# Patient Record
Sex: Male | Born: 1955 | Race: White | Hispanic: No
Health system: Southern US, Community
[De-identification: ages and names within clinical notes are randomized; demographics above are authoritative.]

---

## 2013-07-18 DIAGNOSIS — I619 Nontraumatic intracerebral hemorrhage, unspecified: Secondary | ICD-10-CM | POA: Insufficient documentation

## 2013-11-03 DIAGNOSIS — E119 Type 2 diabetes mellitus without complications: Secondary | ICD-10-CM | POA: Insufficient documentation

## 2013-11-03 DIAGNOSIS — I10 Essential (primary) hypertension: Secondary | ICD-10-CM | POA: Insufficient documentation

## 2013-11-03 DIAGNOSIS — E785 Hyperlipidemia, unspecified: Secondary | ICD-10-CM | POA: Insufficient documentation

## 2014-04-28 DIAGNOSIS — I639 Cerebral infarction, unspecified: Secondary | ICD-10-CM | POA: Insufficient documentation

## 2014-04-28 DIAGNOSIS — I6522 Occlusion and stenosis of left carotid artery: Secondary | ICD-10-CM | POA: Insufficient documentation

## 2016-06-28 ENCOUNTER — Ambulatory Visit (INDEPENDENT_AMBULATORY_CARE_PROVIDER_SITE_OTHER): Payer: Medicare Other | Admitting: Sports Medicine

## 2016-06-28 ENCOUNTER — Encounter: Payer: Self-pay | Admitting: Sports Medicine

## 2016-06-28 DIAGNOSIS — IMO0002 Reserved for concepts with insufficient information to code with codable children: Secondary | ICD-10-CM

## 2016-06-28 DIAGNOSIS — E1142 Type 2 diabetes mellitus with diabetic polyneuropathy: Secondary | ICD-10-CM

## 2016-06-28 DIAGNOSIS — M79675 Pain in left toe(s): Secondary | ICD-10-CM

## 2016-06-28 DIAGNOSIS — Z89511 Acquired absence of right leg below knee: Secondary | ICD-10-CM

## 2016-06-28 DIAGNOSIS — B351 Tinea unguium: Secondary | ICD-10-CM | POA: Diagnosis not present

## 2016-06-28 NOTE — Progress Notes (Signed)
Subjective: Burle Kwan is a 60 y.o. male patient with history of diabetes who presents to office today complaining of long, painful nails  while ambulating in shoes; unable to trim however states that his mom trimmed them last week. Patient states that the glucose reading this morning was 95 mg/dl. Patient denies any new changes in medication or new problems. Patient denies any new cramping, numbness, burning or tingling in the legs. Diagnosed with Diabetes 5 years ago and lost right leg 1 year ago secondary to "pain".   Patient Active Problem List   Diagnosis Date Noted  . Carotid stenosis, left 04/28/2014  . Stroke ([place]) 04/28/2014  . Diabetes ([place]) 11/03/2013  . HTN (hypertension) 11/03/2013  . Hyperlipidemia with target low density lipoprotein (LDL) cholesterol less than 70 mg/dL 11/03/2013  . ICH (intracerebral hemorrhage) ([place]) 07/18/2013   No current outpatient prescriptions on file prior to visit.   No current facility-administered medications on file prior to visit.    No Known Allergies  No results found for this or any previous visit (from the past 2160 hour(s)).  Objective: General: Patient is awake, alert, and oriented x 3 and in no acute distress, Cane assisted gait.   Integument: Skin is warm, dry and supple on left. Nails are tender, mildly elongated, thickened and dystrophic with subungual debris, consistent with onychomycosis, 1-5 on left, No signs of infection. No open lesions or preulcerative lesions present on left. Remaining integument unremarkable.  Vasculature:  Dorsalis Pedis pulse 1/4 left. Posterior Tibial pulse  1/4 left. Capillary fill time <3 sec 1-5 left. Scant hair growth to the level of the digits.Temperature gradient within normal limits. No varicosities present on left. No edema present on left.  Neurology: The patient has diminished sensation measured with a 5.07/10g Semmes Weinstein Monofilament at all pedal sites on left. Vibratory sensation  diminished on left with tuning fork. No Babinski sign present on left.  Musculoskeletal:  Asymptomatic hammertoe pedal deformities and restless leg noted on left. Muscular strength 5/5 in all lower extremity muscular groups on left without pain on range of motion . No tenderness with calf compression on left. Right below knee amputation status.   Assessment and Plan: Problem List Items Addressed This Visit    None    Visit Diagnoses    Dermatophytosis of nail    -  Primary   Toe pain, left       Diabetic polyneuropathy associated with type 2 diabetes mellitus (HCC)       Relevant Medications   atorvastatin (LIPITOR) 80 MG tablet   insulin lispro (HUMALOG KWIKPEN) 100 UNIT/ML KiwkPen   insulin glargine (LANTUS) 100 unit/mL SOPN   insulin aspart (NOVOLOG FLEXPEN) 100 UNIT/ML FlexPen   Below knee amputation status, right (HCC)         -Examined patient. -Discussed and educated patient on diabetic foot care, especially with regards to the vascular, neurological and musculoskeletal systems.  -Stressed the importance of good glycemic control and the detriment of not controlling glucose levels in relation to the foot. -Mechanically debrided all nails 1-5 on left using sterile nail nipper and filed with dremel without incident  -Answered all patient questions -Patient to return in 3 months for at risk foot care -Patient advised to call the office if any problems or questions arise in the meantime.  Landis Martins, DPM

## 2016-06-28 NOTE — Patient Instructions (Signed)
Diabetes and Foot Care Diabetes may cause you to have problems because of poor blood supply (circulation) to your feet and legs. This may cause the skin on your feet to become thinner, break easier, and heal more slowly. Your skin may become dry, and the skin may peel and crack. You may also have nerve damage in your legs and feet causing decreased feeling in them. You may not notice minor injuries to your feet that could lead to infections or more serious problems. Taking care of your feet is one of the most important things you can do for yourself.  HOME CARE INSTRUCTIONS  Wear shoes at all times, even in the house. Do not go barefoot. Bare feet are easily injured.  Check your feet daily for blisters, cuts, and redness. If you cannot see the bottom of your feet, use a mirror or ask someone for help.  Wash your feet with warm water (do not use hot water) and mild soap. Then pat your feet and the areas between your toes until they are completely dry. Do not soak your feet as this can dry your skin.  Apply a moisturizing lotion or petroleum jelly (that does not contain alcohol and is unscented) to the skin on your feet and to dry, brittle toenails. Do not apply lotion between your toes.  Trim your toenails straight across. Do not dig under them or around the cuticle. File the edges of your nails with an emery board or nail file.  Do not cut corns or calluses or try to remove them with medicine.  Wear clean socks or stockings every day. Make sure they are not too tight. Do not wear knee-high stockings since they may decrease blood flow to your legs.  Wear shoes that fit properly and have enough cushioning. To break in new shoes, wear them for just a few hours a day. This prevents you from injuring your feet. Always look in your shoes before you put them on to be sure there are no objects inside.  Do not cross your legs. This may decrease the blood flow to your feet.  If you find a minor scrape,  cut, or break in the skin on your feet, keep it and the skin around it clean and dry. These areas may be cleansed with mild soap and water. Do not cleanse the area with peroxide, alcohol, or iodine.  When you remove an adhesive bandage, be sure not to damage the skin around it.  If you have a wound, look at it several times a day to make sure it is healing.  Do not use heating pads or hot water bottles. They may burn your skin. If you have lost feeling in your feet or legs, you may not know it is happening until it is too late.  Make sure your health care provider performs a complete foot exam at least annually or more often if you have foot problems. Report any cuts, sores, or bruises to your health care provider immediately. SEEK MEDICAL CARE IF:   You have an injury that is not healing.  You have cuts or breaks in the skin.  You have an ingrown nail.  You notice redness on your legs or feet.  You feel burning or tingling in your legs or feet.  You have pain or cramps in your legs and feet.  Your legs or feet are numb.  Your feet always feel cold. SEEK IMMEDIATE MEDICAL CARE IF:   There is increasing redness,   swelling, or pain in or around a wound.  There is a red line that goes up your leg.  Pus is coming from a wound.  You develop a fever or as directed by your health care provider.  You notice a bad smell coming from an ulcer or wound.   This information is not intended to replace advice given to you by your health care provider. Make sure you discuss any questions you have with your health care provider.   Document Released: 09/06/2000 Document Revised: 05/12/2013 Document Reviewed: 02/16/2013 Elsevier Interactive Patient Education 2016 [employer]  

## 2016-08-08 DIAGNOSIS — E119 Type 2 diabetes mellitus without complications: Secondary | ICD-10-CM

## 2016-08-08 DIAGNOSIS — N390 Urinary tract infection, site not specified: Secondary | ICD-10-CM | POA: Diagnosis not present

## 2016-08-08 DIAGNOSIS — E872 Acidosis: Secondary | ICD-10-CM | POA: Diagnosis not present

## 2016-08-08 DIAGNOSIS — I10 Essential (primary) hypertension: Secondary | ICD-10-CM

## 2016-08-08 DIAGNOSIS — S37009A Unspecified injury of unspecified kidney, initial encounter: Secondary | ICD-10-CM

## 2016-08-08 DIAGNOSIS — R410 Disorientation, unspecified: Secondary | ICD-10-CM

## 2016-08-09 DIAGNOSIS — N390 Urinary tract infection, site not specified: Secondary | ICD-10-CM | POA: Diagnosis not present

## 2016-08-09 DIAGNOSIS — S37009A Unspecified injury of unspecified kidney, initial encounter: Secondary | ICD-10-CM | POA: Diagnosis not present

## 2016-08-09 DIAGNOSIS — E872 Acidosis: Secondary | ICD-10-CM | POA: Diagnosis not present

## 2016-08-09 DIAGNOSIS — R410 Disorientation, unspecified: Secondary | ICD-10-CM | POA: Diagnosis not present

## 2016-08-10 DIAGNOSIS — E872 Acidosis: Secondary | ICD-10-CM | POA: Diagnosis not present

## 2016-08-10 DIAGNOSIS — A419 Sepsis, unspecified organism: Secondary | ICD-10-CM

## 2016-08-10 DIAGNOSIS — N390 Urinary tract infection, site not specified: Secondary | ICD-10-CM | POA: Diagnosis not present

## 2016-08-10 DIAGNOSIS — R410 Disorientation, unspecified: Secondary | ICD-10-CM | POA: Diagnosis not present

## 2016-08-10 DIAGNOSIS — S37009A Unspecified injury of unspecified kidney, initial encounter: Secondary | ICD-10-CM | POA: Diagnosis not present

## 2016-08-11 DIAGNOSIS — R410 Disorientation, unspecified: Secondary | ICD-10-CM | POA: Diagnosis not present

## 2016-08-11 DIAGNOSIS — N390 Urinary tract infection, site not specified: Secondary | ICD-10-CM | POA: Diagnosis not present

## 2016-08-11 DIAGNOSIS — E872 Acidosis: Secondary | ICD-10-CM | POA: Diagnosis not present

## 2016-08-11 DIAGNOSIS — S37009A Unspecified injury of unspecified kidney, initial encounter: Secondary | ICD-10-CM | POA: Diagnosis not present

## 2016-08-12 DIAGNOSIS — N390 Urinary tract infection, site not specified: Secondary | ICD-10-CM | POA: Diagnosis not present

## 2016-08-12 DIAGNOSIS — R410 Disorientation, unspecified: Secondary | ICD-10-CM | POA: Diagnosis not present

## 2016-08-12 DIAGNOSIS — E872 Acidosis: Secondary | ICD-10-CM | POA: Diagnosis not present

## 2016-08-12 DIAGNOSIS — S37009A Unspecified injury of unspecified kidney, initial encounter: Secondary | ICD-10-CM | POA: Diagnosis not present

## 2016-09-24 DIAGNOSIS — N3011 Interstitial cystitis (chronic) with hematuria: Secondary | ICD-10-CM | POA: Diagnosis not present

## 2016-09-24 DIAGNOSIS — Z89511 Acquired absence of right leg below knee: Secondary | ICD-10-CM | POA: Diagnosis not present

## 2016-09-24 DIAGNOSIS — N184 Chronic kidney disease, stage 4 (severe): Secondary | ICD-10-CM | POA: Diagnosis not present

## 2016-09-24 DIAGNOSIS — Z9181 History of falling: Secondary | ICD-10-CM | POA: Diagnosis not present

## 2016-09-24 DIAGNOSIS — A419 Sepsis, unspecified organism: Secondary | ICD-10-CM | POA: Diagnosis not present

## 2016-09-24 DIAGNOSIS — N3001 Acute cystitis with hematuria: Secondary | ICD-10-CM | POA: Diagnosis not present

## 2016-09-24 DIAGNOSIS — I129 Hypertensive chronic kidney disease with stage 1 through stage 4 chronic kidney disease, or unspecified chronic kidney disease: Secondary | ICD-10-CM | POA: Diagnosis not present

## 2016-09-24 DIAGNOSIS — Z79891 Long term (current) use of opiate analgesic: Secondary | ICD-10-CM | POA: Diagnosis not present

## 2016-09-24 DIAGNOSIS — E1122 Type 2 diabetes mellitus with diabetic chronic kidney disease: Secondary | ICD-10-CM | POA: Diagnosis not present

## 2016-09-24 DIAGNOSIS — Z7982 Long term (current) use of aspirin: Secondary | ICD-10-CM | POA: Diagnosis not present

## 2016-09-24 DIAGNOSIS — Z8673 Personal history of transient ischemic attack (TIA), and cerebral infarction without residual deficits: Secondary | ICD-10-CM | POA: Diagnosis not present

## 2016-09-25 DIAGNOSIS — Z9181 History of falling: Secondary | ICD-10-CM | POA: Diagnosis not present

## 2016-09-25 DIAGNOSIS — N3011 Interstitial cystitis (chronic) with hematuria: Secondary | ICD-10-CM | POA: Diagnosis not present

## 2016-09-25 DIAGNOSIS — N3001 Acute cystitis with hematuria: Secondary | ICD-10-CM | POA: Diagnosis not present

## 2016-09-25 DIAGNOSIS — Z8673 Personal history of transient ischemic attack (TIA), and cerebral infarction without residual deficits: Secondary | ICD-10-CM | POA: Diagnosis not present

## 2016-09-25 DIAGNOSIS — Z7982 Long term (current) use of aspirin: Secondary | ICD-10-CM | POA: Diagnosis not present

## 2016-09-25 DIAGNOSIS — Z79891 Long term (current) use of opiate analgesic: Secondary | ICD-10-CM | POA: Diagnosis not present

## 2016-09-25 DIAGNOSIS — E1122 Type 2 diabetes mellitus with diabetic chronic kidney disease: Secondary | ICD-10-CM | POA: Diagnosis not present

## 2016-09-25 DIAGNOSIS — I129 Hypertensive chronic kidney disease with stage 1 through stage 4 chronic kidney disease, or unspecified chronic kidney disease: Secondary | ICD-10-CM | POA: Diagnosis not present

## 2016-09-25 DIAGNOSIS — A419 Sepsis, unspecified organism: Secondary | ICD-10-CM | POA: Diagnosis not present

## 2016-09-25 DIAGNOSIS — N184 Chronic kidney disease, stage 4 (severe): Secondary | ICD-10-CM | POA: Diagnosis not present

## 2016-09-25 DIAGNOSIS — Z89511 Acquired absence of right leg below knee: Secondary | ICD-10-CM | POA: Diagnosis not present

## 2016-09-27 DIAGNOSIS — N189 Chronic kidney disease, unspecified: Secondary | ICD-10-CM | POA: Diagnosis not present

## 2016-10-01 DIAGNOSIS — N3001 Acute cystitis with hematuria: Secondary | ICD-10-CM | POA: Diagnosis not present

## 2016-10-01 DIAGNOSIS — Z79891 Long term (current) use of opiate analgesic: Secondary | ICD-10-CM | POA: Diagnosis not present

## 2016-10-01 DIAGNOSIS — E1122 Type 2 diabetes mellitus with diabetic chronic kidney disease: Secondary | ICD-10-CM | POA: Diagnosis not present

## 2016-10-01 DIAGNOSIS — Z89511 Acquired absence of right leg below knee: Secondary | ICD-10-CM | POA: Diagnosis not present

## 2016-10-01 DIAGNOSIS — N3011 Interstitial cystitis (chronic) with hematuria: Secondary | ICD-10-CM | POA: Diagnosis not present

## 2016-10-01 DIAGNOSIS — Z8673 Personal history of transient ischemic attack (TIA), and cerebral infarction without residual deficits: Secondary | ICD-10-CM | POA: Diagnosis not present

## 2016-10-01 DIAGNOSIS — Z7982 Long term (current) use of aspirin: Secondary | ICD-10-CM | POA: Diagnosis not present

## 2016-10-01 DIAGNOSIS — A419 Sepsis, unspecified organism: Secondary | ICD-10-CM | POA: Diagnosis not present

## 2016-10-01 DIAGNOSIS — Z9181 History of falling: Secondary | ICD-10-CM | POA: Diagnosis not present

## 2016-10-01 DIAGNOSIS — I129 Hypertensive chronic kidney disease with stage 1 through stage 4 chronic kidney disease, or unspecified chronic kidney disease: Secondary | ICD-10-CM | POA: Diagnosis not present

## 2016-10-01 DIAGNOSIS — N184 Chronic kidney disease, stage 4 (severe): Secondary | ICD-10-CM | POA: Diagnosis not present

## 2016-10-04 ENCOUNTER — Encounter: Payer: Self-pay | Admitting: Sports Medicine

## 2016-10-04 ENCOUNTER — Ambulatory Visit (INDEPENDENT_AMBULATORY_CARE_PROVIDER_SITE_OTHER): Payer: Medicare Other | Admitting: Sports Medicine

## 2016-10-04 DIAGNOSIS — E1142 Type 2 diabetes mellitus with diabetic polyneuropathy: Secondary | ICD-10-CM

## 2016-10-04 DIAGNOSIS — B351 Tinea unguium: Secondary | ICD-10-CM | POA: Diagnosis not present

## 2016-10-04 DIAGNOSIS — M79675 Pain in left toe(s): Secondary | ICD-10-CM | POA: Diagnosis not present

## 2016-10-04 DIAGNOSIS — Z89511 Acquired absence of right leg below knee: Secondary | ICD-10-CM

## 2016-10-04 DIAGNOSIS — IMO0002 Reserved for concepts with insufficient information to code with codable children: Secondary | ICD-10-CM

## 2016-10-04 NOTE — Progress Notes (Signed)
Subjective: Ryan Villa is a 61 y.o. male patient with history of diabetes who presents to office today complaining of long, painful nails  while ambulating in shoe; unable to trim. Patient states that the glucose reading this morning was "ok". Patient denies any new changes in medication or new problems. Patient denies any new cramping, numbness, burning or tingling in the legs.  Patient Active Problem List   Diagnosis Date Noted  . Carotid stenosis, left 04/28/2014  . Stroke ([place]) 04/28/2014  . Diabetes ([place]) 11/03/2013  . HTN (hypertension) 11/03/2013  . Hyperlipidemia with target low density lipoprotein (LDL) cholesterol less than 70 mg/dL 11/03/2013  . ICH (intracerebral hemorrhage) ([place]) 07/18/2013   Current Outpatient Prescriptions on File Prior to Visit  Medication Sig Dispense Refill  . amLODipine (NORVASC) 10 MG tablet Take 10 mg by mouth.    [name] atorvastatin (LIPITOR) 80 MG tablet Take 80 mg by mouth.    . carvedilol (COREG) 6.25 MG tablet Take 6.25 mg by mouth.    . fluticasone (FLONASE) 50 MCG/ACT nasal spray Place into the nose.    [name] glucose blood (ONE TOUCH ULTRA TEST) test strip     . insulin aspart (NOVOLOG FLEXPEN) 100 UNIT/ML FlexPen Sliding scale    . insulin glargine (LANTUS) 100 unit/mL SOPN Inject into the skin.    [name] insulin lispro (HUMALOG KWIKPEN) 100 UNIT/ML KiwkPen     . ONETOUCH DELICA LANCETS 54U MISC     . Probiotic Product (FLORA-Q PO) Take by mouth.     No current facility-administered medications on file prior to visit.    No Known Allergies  No results found for this or any previous visit (from the past 2160 hour(s)).  Objective: General: Patient is awake, alert, and oriented x 3 and in no acute distress, Cane assisted gait.   Integument: Skin is warm, dry and supple on left. Nails are tender, mildly elongated, thickened and dystrophic with subungual debris, consistent with onychomycosis, 1-5 on left, + interdigital maceration, No signs of infection.  No open lesions or preulcerative lesions present on left. Remaining integument unremarkable.  Vasculature:  Dorsalis Pedis pulse 1/4 left. Posterior Tibial pulse  1/4 left. Capillary fill time <3 sec 1-5 left. Scant hair growth to the level of the digits.Temperature gradient within normal limits. No varicosities present on left. No edema present on left.  Neurology: The patient has diminished sensation measured with a 5.07/10g Semmes Weinstein Monofilament at all pedal sites on left. Vibratory sensation diminished on left with tuning fork. No Babinski sign present on left.  Musculoskeletal:  Asymptomatic hammertoe pedal deformities and restless leg noted on left. Muscular strength 5/5 in all lower extremity muscular groups on left without pain on range of motion . No tenderness with calf compression on left. Right below knee amputation status.   Assessment and Plan: Problem List Items Addressed This Visit    None    Visit Diagnoses    Dermatophytosis of nail    -  Primary   Relevant Medications   nystatin ointment (MYCOSTATIN)   Toe pain, left       Diabetic polyneuropathy associated with type 2 diabetes mellitus ([place])       Below knee amputation status, right ([place])         -Examined patient. -Discussed and educated patient on diabetic foot care, especially with regards to the vascular, neurological and musculoskeletal systems.  -Stressed the importance of good glycemic control and the detriment of not controlling glucose levels in relation to  the foot. -Mechanically debrided all nails 1-5 on left using sterile nail nipper and filed with dremel without incident  -Applied Gentician violet in between toes and advised patient to dry well to prevent fungal infection  -Answered all patient questions -Patient to return in 3 months for at risk foot care -Patient advised to call the office if any problems or questions arise in the meantime.  Landis Martins, DPM

## 2016-10-11 DIAGNOSIS — E1122 Type 2 diabetes mellitus with diabetic chronic kidney disease: Secondary | ICD-10-CM | POA: Diagnosis not present

## 2016-10-11 DIAGNOSIS — N3001 Acute cystitis with hematuria: Secondary | ICD-10-CM | POA: Diagnosis not present

## 2016-10-11 DIAGNOSIS — A419 Sepsis, unspecified organism: Secondary | ICD-10-CM | POA: Diagnosis not present

## 2016-10-11 DIAGNOSIS — Z7982 Long term (current) use of aspirin: Secondary | ICD-10-CM | POA: Diagnosis not present

## 2016-10-11 DIAGNOSIS — Z9181 History of falling: Secondary | ICD-10-CM | POA: Diagnosis not present

## 2016-10-11 DIAGNOSIS — Z89511 Acquired absence of right leg below knee: Secondary | ICD-10-CM | POA: Diagnosis not present

## 2016-10-11 DIAGNOSIS — N3011 Interstitial cystitis (chronic) with hematuria: Secondary | ICD-10-CM | POA: Diagnosis not present

## 2016-10-11 DIAGNOSIS — Z8673 Personal history of transient ischemic attack (TIA), and cerebral infarction without residual deficits: Secondary | ICD-10-CM | POA: Diagnosis not present

## 2016-10-11 DIAGNOSIS — I129 Hypertensive chronic kidney disease with stage 1 through stage 4 chronic kidney disease, or unspecified chronic kidney disease: Secondary | ICD-10-CM | POA: Diagnosis not present

## 2016-10-11 DIAGNOSIS — N184 Chronic kidney disease, stage 4 (severe): Secondary | ICD-10-CM | POA: Diagnosis not present

## 2016-10-11 DIAGNOSIS — Z79891 Long term (current) use of opiate analgesic: Secondary | ICD-10-CM | POA: Diagnosis not present

## 2016-11-07 DIAGNOSIS — E1169 Type 2 diabetes mellitus with other specified complication: Secondary | ICD-10-CM | POA: Diagnosis not present

## 2016-11-07 DIAGNOSIS — N390 Urinary tract infection, site not specified: Secondary | ICD-10-CM | POA: Diagnosis not present

## 2016-11-07 DIAGNOSIS — E785 Hyperlipidemia, unspecified: Secondary | ICD-10-CM | POA: Diagnosis not present

## 2016-11-07 DIAGNOSIS — D649 Anemia, unspecified: Secondary | ICD-10-CM | POA: Diagnosis not present

## 2016-11-07 DIAGNOSIS — N189 Chronic kidney disease, unspecified: Secondary | ICD-10-CM | POA: Diagnosis not present

## 2016-11-07 DIAGNOSIS — R809 Proteinuria, unspecified: Secondary | ICD-10-CM | POA: Diagnosis not present

## 2016-11-07 DIAGNOSIS — N184 Chronic kidney disease, stage 4 (severe): Secondary | ICD-10-CM | POA: Diagnosis not present

## 2016-11-07 DIAGNOSIS — R309 Painful micturition, unspecified: Secondary | ICD-10-CM | POA: Diagnosis not present

## 2016-11-07 DIAGNOSIS — E211 Secondary hyperparathyroidism, not elsewhere classified: Secondary | ICD-10-CM | POA: Diagnosis not present

## 2016-11-07 DIAGNOSIS — I10 Essential (primary) hypertension: Secondary | ICD-10-CM | POA: Diagnosis not present

## 2016-11-07 DIAGNOSIS — I1310 Hypertensive heart and chronic kidney disease without heart failure, with stage 1 through stage 4 chronic kidney disease, or unspecified chronic kidney disease: Secondary | ICD-10-CM | POA: Diagnosis not present

## 2016-11-28 DIAGNOSIS — E782 Mixed hyperlipidemia: Secondary | ICD-10-CM | POA: Diagnosis not present

## 2016-11-28 DIAGNOSIS — E1159 Type 2 diabetes mellitus with other circulatory complications: Secondary | ICD-10-CM | POA: Diagnosis not present

## 2016-11-28 DIAGNOSIS — E1122 Type 2 diabetes mellitus with diabetic chronic kidney disease: Secondary | ICD-10-CM | POA: Diagnosis not present

## 2016-12-10 DIAGNOSIS — N189 Chronic kidney disease, unspecified: Secondary | ICD-10-CM | POA: Diagnosis not present

## 2016-12-10 DIAGNOSIS — E785 Hyperlipidemia, unspecified: Secondary | ICD-10-CM | POA: Diagnosis not present

## 2017-01-08 DIAGNOSIS — R309 Painful micturition, unspecified: Secondary | ICD-10-CM | POA: Diagnosis not present

## 2017-01-08 DIAGNOSIS — N189 Chronic kidney disease, unspecified: Secondary | ICD-10-CM | POA: Diagnosis not present

## 2017-01-08 DIAGNOSIS — E785 Hyperlipidemia, unspecified: Secondary | ICD-10-CM | POA: Diagnosis not present

## 2017-01-08 DIAGNOSIS — R809 Proteinuria, unspecified: Secondary | ICD-10-CM | POA: Diagnosis not present

## 2017-01-08 DIAGNOSIS — N184 Chronic kidney disease, stage 4 (severe): Secondary | ICD-10-CM | POA: Diagnosis not present

## 2017-01-08 DIAGNOSIS — E559 Vitamin D deficiency, unspecified: Secondary | ICD-10-CM | POA: Diagnosis not present

## 2017-01-08 DIAGNOSIS — I10 Essential (primary) hypertension: Secondary | ICD-10-CM | POA: Diagnosis not present

## 2017-01-08 DIAGNOSIS — D649 Anemia, unspecified: Secondary | ICD-10-CM | POA: Diagnosis not present

## 2017-01-08 DIAGNOSIS — I1310 Hypertensive heart and chronic kidney disease without heart failure, with stage 1 through stage 4 chronic kidney disease, or unspecified chronic kidney disease: Secondary | ICD-10-CM | POA: Diagnosis not present

## 2017-01-08 DIAGNOSIS — E1169 Type 2 diabetes mellitus with other specified complication: Secondary | ICD-10-CM | POA: Diagnosis not present

## 2017-01-08 DIAGNOSIS — N390 Urinary tract infection, site not specified: Secondary | ICD-10-CM | POA: Diagnosis not present

## 2017-02-12 DIAGNOSIS — E1159 Type 2 diabetes mellitus with other circulatory complications: Secondary | ICD-10-CM | POA: Diagnosis not present

## 2017-02-12 DIAGNOSIS — E1122 Type 2 diabetes mellitus with diabetic chronic kidney disease: Secondary | ICD-10-CM | POA: Diagnosis not present

## 2017-02-12 DIAGNOSIS — E785 Hyperlipidemia, unspecified: Secondary | ICD-10-CM | POA: Diagnosis not present

## 2017-03-14 DIAGNOSIS — E559 Vitamin D deficiency, unspecified: Secondary | ICD-10-CM | POA: Diagnosis not present

## 2017-03-14 DIAGNOSIS — N390 Urinary tract infection, site not specified: Secondary | ICD-10-CM | POA: Diagnosis not present

## 2017-03-14 DIAGNOSIS — R309 Painful micturition, unspecified: Secondary | ICD-10-CM | POA: Diagnosis not present

## 2017-03-14 DIAGNOSIS — E1169 Type 2 diabetes mellitus with other specified complication: Secondary | ICD-10-CM | POA: Diagnosis not present

## 2017-03-14 DIAGNOSIS — E611 Iron deficiency: Secondary | ICD-10-CM | POA: Diagnosis not present

## 2017-03-14 DIAGNOSIS — E785 Hyperlipidemia, unspecified: Secondary | ICD-10-CM | POA: Diagnosis not present

## 2017-03-14 DIAGNOSIS — I10 Essential (primary) hypertension: Secondary | ICD-10-CM | POA: Diagnosis not present

## 2017-03-14 DIAGNOSIS — D649 Anemia, unspecified: Secondary | ICD-10-CM | POA: Diagnosis not present

## 2017-03-14 DIAGNOSIS — N184 Chronic kidney disease, stage 4 (severe): Secondary | ICD-10-CM | POA: Diagnosis not present

## 2017-03-14 DIAGNOSIS — N189 Chronic kidney disease, unspecified: Secondary | ICD-10-CM | POA: Diagnosis not present

## 2017-04-23 DIAGNOSIS — I10 Essential (primary) hypertension: Secondary | ICD-10-CM | POA: Diagnosis not present

## 2017-04-23 DIAGNOSIS — R471 Dysarthria and anarthria: Secondary | ICD-10-CM | POA: Diagnosis not present

## 2017-04-23 DIAGNOSIS — R29705 NIHSS score 5: Secondary | ICD-10-CM | POA: Diagnosis not present

## 2017-04-23 DIAGNOSIS — Z79899 Other long term (current) drug therapy: Secondary | ICD-10-CM | POA: Diagnosis not present

## 2017-04-23 DIAGNOSIS — I6789 Other cerebrovascular disease: Secondary | ICD-10-CM | POA: Diagnosis not present

## 2017-04-23 DIAGNOSIS — I6522 Occlusion and stenosis of left carotid artery: Secondary | ICD-10-CM | POA: Diagnosis not present

## 2017-04-23 DIAGNOSIS — Z7982 Long term (current) use of aspirin: Secondary | ICD-10-CM | POA: Diagnosis not present

## 2017-04-23 DIAGNOSIS — E119 Type 2 diabetes mellitus without complications: Secondary | ICD-10-CM | POA: Diagnosis not present

## 2017-04-23 DIAGNOSIS — I129 Hypertensive chronic kidney disease with stage 1 through stage 4 chronic kidney disease, or unspecified chronic kidney disease: Secondary | ICD-10-CM | POA: Diagnosis not present

## 2017-04-23 DIAGNOSIS — Z89511 Acquired absence of right leg below knee: Secondary | ICD-10-CM | POA: Diagnosis not present

## 2017-04-23 DIAGNOSIS — N184 Chronic kidney disease, stage 4 (severe): Secondary | ICD-10-CM | POA: Diagnosis not present

## 2017-04-23 DIAGNOSIS — N189 Chronic kidney disease, unspecified: Secondary | ICD-10-CM | POA: Diagnosis not present

## 2017-04-23 DIAGNOSIS — N3000 Acute cystitis without hematuria: Secondary | ICD-10-CM | POA: Diagnosis not present

## 2017-04-23 DIAGNOSIS — D649 Anemia, unspecified: Secondary | ICD-10-CM | POA: Diagnosis not present

## 2017-04-23 DIAGNOSIS — E872 Acidosis: Secondary | ICD-10-CM | POA: Diagnosis not present

## 2017-04-23 DIAGNOSIS — R4781 Slurred speech: Secondary | ICD-10-CM | POA: Diagnosis not present

## 2017-04-23 DIAGNOSIS — I639 Cerebral infarction, unspecified: Secondary | ICD-10-CM | POA: Diagnosis not present

## 2017-04-23 DIAGNOSIS — E7800 Pure hypercholesterolemia, unspecified: Secondary | ICD-10-CM | POA: Diagnosis not present

## 2017-04-23 DIAGNOSIS — R29818 Other symptoms and signs involving the nervous system: Secondary | ICD-10-CM | POA: Diagnosis not present

## 2017-04-23 DIAGNOSIS — Z87891 Personal history of nicotine dependence: Secondary | ICD-10-CM | POA: Diagnosis not present

## 2017-04-23 DIAGNOSIS — E1122 Type 2 diabetes mellitus with diabetic chronic kidney disease: Secondary | ICD-10-CM | POA: Diagnosis not present

## 2017-04-23 DIAGNOSIS — I669 Occlusion and stenosis of unspecified cerebral artery: Secondary | ICD-10-CM | POA: Diagnosis not present

## 2017-04-23 DIAGNOSIS — Z8673 Personal history of transient ischemic attack (TIA), and cerebral infarction without residual deficits: Secondary | ICD-10-CM | POA: Diagnosis not present

## 2017-04-23 DIAGNOSIS — N390 Urinary tract infection, site not specified: Secondary | ICD-10-CM | POA: Diagnosis not present

## 2017-04-23 DIAGNOSIS — E870 Hyperosmolality and hypernatremia: Secondary | ICD-10-CM | POA: Diagnosis not present

## 2017-04-24 DIAGNOSIS — R471 Dysarthria and anarthria: Secondary | ICD-10-CM | POA: Diagnosis not present

## 2017-04-24 DIAGNOSIS — N390 Urinary tract infection, site not specified: Secondary | ICD-10-CM | POA: Diagnosis not present

## 2017-04-24 DIAGNOSIS — E872 Acidosis: Secondary | ICD-10-CM | POA: Diagnosis not present

## 2017-04-24 DIAGNOSIS — N189 Chronic kidney disease, unspecified: Secondary | ICD-10-CM | POA: Diagnosis not present

## 2017-04-24 DIAGNOSIS — I10 Essential (primary) hypertension: Secondary | ICD-10-CM | POA: Diagnosis not present

## 2017-04-24 DIAGNOSIS — D649 Anemia, unspecified: Secondary | ICD-10-CM | POA: Diagnosis not present

## 2017-04-24 DIAGNOSIS — E119 Type 2 diabetes mellitus without complications: Secondary | ICD-10-CM | POA: Diagnosis not present

## 2017-04-24 DIAGNOSIS — I6789 Other cerebrovascular disease: Secondary | ICD-10-CM | POA: Diagnosis not present

## 2017-04-29 DIAGNOSIS — N184 Chronic kidney disease, stage 4 (severe): Secondary | ICD-10-CM | POA: Diagnosis not present

## 2017-04-29 DIAGNOSIS — E785 Hyperlipidemia, unspecified: Secondary | ICD-10-CM | POA: Diagnosis not present

## 2017-04-29 DIAGNOSIS — Z89511 Acquired absence of right leg below knee: Secondary | ICD-10-CM | POA: Diagnosis not present

## 2017-04-29 DIAGNOSIS — I69398 Other sequelae of cerebral infarction: Secondary | ICD-10-CM | POA: Diagnosis not present

## 2017-04-29 DIAGNOSIS — E1122 Type 2 diabetes mellitus with diabetic chronic kidney disease: Secondary | ICD-10-CM | POA: Diagnosis not present

## 2017-04-29 DIAGNOSIS — E1142 Type 2 diabetes mellitus with diabetic polyneuropathy: Secondary | ICD-10-CM | POA: Diagnosis not present

## 2017-04-29 DIAGNOSIS — Z87891 Personal history of nicotine dependence: Secondary | ICD-10-CM | POA: Diagnosis not present

## 2017-04-29 DIAGNOSIS — M6281 Muscle weakness (generalized): Secondary | ICD-10-CM | POA: Diagnosis not present

## 2017-04-29 DIAGNOSIS — D649 Anemia, unspecified: Secondary | ICD-10-CM | POA: Diagnosis not present

## 2017-04-29 DIAGNOSIS — I69322 Dysarthria following cerebral infarction: Secondary | ICD-10-CM | POA: Diagnosis not present

## 2017-04-29 DIAGNOSIS — I129 Hypertensive chronic kidney disease with stage 1 through stage 4 chronic kidney disease, or unspecified chronic kidney disease: Secondary | ICD-10-CM | POA: Diagnosis not present

## 2017-05-01 DIAGNOSIS — N184 Chronic kidney disease, stage 4 (severe): Secondary | ICD-10-CM | POA: Diagnosis not present

## 2017-05-01 DIAGNOSIS — E1142 Type 2 diabetes mellitus with diabetic polyneuropathy: Secondary | ICD-10-CM | POA: Diagnosis not present

## 2017-05-01 DIAGNOSIS — M6281 Muscle weakness (generalized): Secondary | ICD-10-CM | POA: Diagnosis not present

## 2017-05-01 DIAGNOSIS — E1122 Type 2 diabetes mellitus with diabetic chronic kidney disease: Secondary | ICD-10-CM | POA: Diagnosis not present

## 2017-05-01 DIAGNOSIS — Z87891 Personal history of nicotine dependence: Secondary | ICD-10-CM | POA: Diagnosis not present

## 2017-05-01 DIAGNOSIS — I69398 Other sequelae of cerebral infarction: Secondary | ICD-10-CM | POA: Diagnosis not present

## 2017-05-01 DIAGNOSIS — I69322 Dysarthria following cerebral infarction: Secondary | ICD-10-CM | POA: Diagnosis not present

## 2017-05-01 DIAGNOSIS — D649 Anemia, unspecified: Secondary | ICD-10-CM | POA: Diagnosis not present

## 2017-05-01 DIAGNOSIS — E785 Hyperlipidemia, unspecified: Secondary | ICD-10-CM | POA: Diagnosis not present

## 2017-05-01 DIAGNOSIS — I129 Hypertensive chronic kidney disease with stage 1 through stage 4 chronic kidney disease, or unspecified chronic kidney disease: Secondary | ICD-10-CM | POA: Diagnosis not present

## 2017-05-01 DIAGNOSIS — Z89511 Acquired absence of right leg below knee: Secondary | ICD-10-CM | POA: Diagnosis not present

## 2017-05-06 DIAGNOSIS — Z87891 Personal history of nicotine dependence: Secondary | ICD-10-CM | POA: Diagnosis not present

## 2017-05-06 DIAGNOSIS — Z89511 Acquired absence of right leg below knee: Secondary | ICD-10-CM | POA: Diagnosis not present

## 2017-05-06 DIAGNOSIS — N3011 Interstitial cystitis (chronic) with hematuria: Secondary | ICD-10-CM | POA: Diagnosis not present

## 2017-05-06 DIAGNOSIS — N184 Chronic kidney disease, stage 4 (severe): Secondary | ICD-10-CM | POA: Diagnosis not present

## 2017-05-06 DIAGNOSIS — D649 Anemia, unspecified: Secondary | ICD-10-CM | POA: Diagnosis not present

## 2017-05-06 DIAGNOSIS — I69398 Other sequelae of cerebral infarction: Secondary | ICD-10-CM | POA: Diagnosis not present

## 2017-05-06 DIAGNOSIS — E1122 Type 2 diabetes mellitus with diabetic chronic kidney disease: Secondary | ICD-10-CM | POA: Diagnosis not present

## 2017-05-06 DIAGNOSIS — E1142 Type 2 diabetes mellitus with diabetic polyneuropathy: Secondary | ICD-10-CM | POA: Diagnosis not present

## 2017-05-06 DIAGNOSIS — I129 Hypertensive chronic kidney disease with stage 1 through stage 4 chronic kidney disease, or unspecified chronic kidney disease: Secondary | ICD-10-CM | POA: Diagnosis not present

## 2017-05-06 DIAGNOSIS — I69322 Dysarthria following cerebral infarction: Secondary | ICD-10-CM | POA: Diagnosis not present

## 2017-05-06 DIAGNOSIS — E785 Hyperlipidemia, unspecified: Secondary | ICD-10-CM | POA: Diagnosis not present

## 2017-05-06 DIAGNOSIS — M6281 Muscle weakness (generalized): Secondary | ICD-10-CM | POA: Diagnosis not present

## 2017-05-08 DIAGNOSIS — Z87891 Personal history of nicotine dependence: Secondary | ICD-10-CM | POA: Diagnosis not present

## 2017-05-08 DIAGNOSIS — I69398 Other sequelae of cerebral infarction: Secondary | ICD-10-CM | POA: Diagnosis not present

## 2017-05-08 DIAGNOSIS — N184 Chronic kidney disease, stage 4 (severe): Secondary | ICD-10-CM | POA: Diagnosis not present

## 2017-05-08 DIAGNOSIS — I69322 Dysarthria following cerebral infarction: Secondary | ICD-10-CM | POA: Diagnosis not present

## 2017-05-08 DIAGNOSIS — Z89511 Acquired absence of right leg below knee: Secondary | ICD-10-CM | POA: Diagnosis not present

## 2017-05-08 DIAGNOSIS — E1122 Type 2 diabetes mellitus with diabetic chronic kidney disease: Secondary | ICD-10-CM | POA: Diagnosis not present

## 2017-05-08 DIAGNOSIS — I129 Hypertensive chronic kidney disease with stage 1 through stage 4 chronic kidney disease, or unspecified chronic kidney disease: Secondary | ICD-10-CM | POA: Diagnosis not present

## 2017-05-08 DIAGNOSIS — E785 Hyperlipidemia, unspecified: Secondary | ICD-10-CM | POA: Diagnosis not present

## 2017-05-08 DIAGNOSIS — E1142 Type 2 diabetes mellitus with diabetic polyneuropathy: Secondary | ICD-10-CM | POA: Diagnosis not present

## 2017-05-08 DIAGNOSIS — M6281 Muscle weakness (generalized): Secondary | ICD-10-CM | POA: Diagnosis not present

## 2017-05-08 DIAGNOSIS — D649 Anemia, unspecified: Secondary | ICD-10-CM | POA: Diagnosis not present

## 2017-05-13 DIAGNOSIS — E1122 Type 2 diabetes mellitus with diabetic chronic kidney disease: Secondary | ICD-10-CM | POA: Diagnosis not present

## 2017-05-13 DIAGNOSIS — Z89511 Acquired absence of right leg below knee: Secondary | ICD-10-CM | POA: Diagnosis not present

## 2017-05-13 DIAGNOSIS — D649 Anemia, unspecified: Secondary | ICD-10-CM | POA: Diagnosis not present

## 2017-05-13 DIAGNOSIS — M6281 Muscle weakness (generalized): Secondary | ICD-10-CM | POA: Diagnosis not present

## 2017-05-13 DIAGNOSIS — E785 Hyperlipidemia, unspecified: Secondary | ICD-10-CM | POA: Diagnosis not present

## 2017-05-13 DIAGNOSIS — Z87891 Personal history of nicotine dependence: Secondary | ICD-10-CM | POA: Diagnosis not present

## 2017-05-13 DIAGNOSIS — I69398 Other sequelae of cerebral infarction: Secondary | ICD-10-CM | POA: Diagnosis not present

## 2017-05-13 DIAGNOSIS — I129 Hypertensive chronic kidney disease with stage 1 through stage 4 chronic kidney disease, or unspecified chronic kidney disease: Secondary | ICD-10-CM | POA: Diagnosis not present

## 2017-05-13 DIAGNOSIS — N184 Chronic kidney disease, stage 4 (severe): Secondary | ICD-10-CM | POA: Diagnosis not present

## 2017-05-13 DIAGNOSIS — I69322 Dysarthria following cerebral infarction: Secondary | ICD-10-CM | POA: Diagnosis not present

## 2017-05-13 DIAGNOSIS — E1142 Type 2 diabetes mellitus with diabetic polyneuropathy: Secondary | ICD-10-CM | POA: Diagnosis not present

## 2017-05-15 DIAGNOSIS — M6281 Muscle weakness (generalized): Secondary | ICD-10-CM | POA: Diagnosis not present

## 2017-05-15 DIAGNOSIS — E1122 Type 2 diabetes mellitus with diabetic chronic kidney disease: Secondary | ICD-10-CM | POA: Diagnosis not present

## 2017-05-15 DIAGNOSIS — D649 Anemia, unspecified: Secondary | ICD-10-CM | POA: Diagnosis not present

## 2017-05-15 DIAGNOSIS — Z89511 Acquired absence of right leg below knee: Secondary | ICD-10-CM | POA: Diagnosis not present

## 2017-05-15 DIAGNOSIS — E1142 Type 2 diabetes mellitus with diabetic polyneuropathy: Secondary | ICD-10-CM | POA: Diagnosis not present

## 2017-05-15 DIAGNOSIS — I69398 Other sequelae of cerebral infarction: Secondary | ICD-10-CM | POA: Diagnosis not present

## 2017-05-15 DIAGNOSIS — N184 Chronic kidney disease, stage 4 (severe): Secondary | ICD-10-CM | POA: Diagnosis not present

## 2017-05-15 DIAGNOSIS — I69322 Dysarthria following cerebral infarction: Secondary | ICD-10-CM | POA: Diagnosis not present

## 2017-05-15 DIAGNOSIS — Z87891 Personal history of nicotine dependence: Secondary | ICD-10-CM | POA: Diagnosis not present

## 2017-05-15 DIAGNOSIS — I129 Hypertensive chronic kidney disease with stage 1 through stage 4 chronic kidney disease, or unspecified chronic kidney disease: Secondary | ICD-10-CM | POA: Diagnosis not present

## 2017-05-15 DIAGNOSIS — E785 Hyperlipidemia, unspecified: Secondary | ICD-10-CM | POA: Diagnosis not present

## 2017-05-19 DIAGNOSIS — E559 Vitamin D deficiency, unspecified: Secondary | ICD-10-CM | POA: Diagnosis not present

## 2017-05-19 DIAGNOSIS — N184 Chronic kidney disease, stage 4 (severe): Secondary | ICD-10-CM | POA: Diagnosis not present

## 2017-05-19 DIAGNOSIS — N189 Chronic kidney disease, unspecified: Secondary | ICD-10-CM | POA: Diagnosis not present

## 2017-05-19 DIAGNOSIS — E211 Secondary hyperparathyroidism, not elsewhere classified: Secondary | ICD-10-CM | POA: Diagnosis not present

## 2017-05-19 DIAGNOSIS — D649 Anemia, unspecified: Secondary | ICD-10-CM | POA: Diagnosis not present

## 2017-05-19 DIAGNOSIS — E1169 Type 2 diabetes mellitus with other specified complication: Secondary | ICD-10-CM | POA: Diagnosis not present

## 2017-05-19 DIAGNOSIS — N390 Urinary tract infection, site not specified: Secondary | ICD-10-CM | POA: Diagnosis not present

## 2017-05-19 DIAGNOSIS — E785 Hyperlipidemia, unspecified: Secondary | ICD-10-CM | POA: Diagnosis not present

## 2017-05-19 DIAGNOSIS — R309 Painful micturition, unspecified: Secondary | ICD-10-CM | POA: Diagnosis not present

## 2017-05-19 DIAGNOSIS — I10 Essential (primary) hypertension: Secondary | ICD-10-CM | POA: Diagnosis not present

## 2017-05-21 DIAGNOSIS — Z87891 Personal history of nicotine dependence: Secondary | ICD-10-CM | POA: Diagnosis not present

## 2017-05-21 DIAGNOSIS — I69322 Dysarthria following cerebral infarction: Secondary | ICD-10-CM | POA: Diagnosis not present

## 2017-05-21 DIAGNOSIS — E785 Hyperlipidemia, unspecified: Secondary | ICD-10-CM | POA: Diagnosis not present

## 2017-05-21 DIAGNOSIS — E1142 Type 2 diabetes mellitus with diabetic polyneuropathy: Secondary | ICD-10-CM | POA: Diagnosis not present

## 2017-05-21 DIAGNOSIS — I69398 Other sequelae of cerebral infarction: Secondary | ICD-10-CM | POA: Diagnosis not present

## 2017-05-21 DIAGNOSIS — N184 Chronic kidney disease, stage 4 (severe): Secondary | ICD-10-CM | POA: Diagnosis not present

## 2017-05-21 DIAGNOSIS — I129 Hypertensive chronic kidney disease with stage 1 through stage 4 chronic kidney disease, or unspecified chronic kidney disease: Secondary | ICD-10-CM | POA: Diagnosis not present

## 2017-05-21 DIAGNOSIS — Z89511 Acquired absence of right leg below knee: Secondary | ICD-10-CM | POA: Diagnosis not present

## 2017-05-21 DIAGNOSIS — M6281 Muscle weakness (generalized): Secondary | ICD-10-CM | POA: Diagnosis not present

## 2017-05-21 DIAGNOSIS — D649 Anemia, unspecified: Secondary | ICD-10-CM | POA: Diagnosis not present

## 2017-05-21 DIAGNOSIS — E1122 Type 2 diabetes mellitus with diabetic chronic kidney disease: Secondary | ICD-10-CM | POA: Diagnosis not present

## 2017-05-28 DIAGNOSIS — I10 Essential (primary) hypertension: Secondary | ICD-10-CM | POA: Diagnosis not present

## 2017-05-28 DIAGNOSIS — E785 Hyperlipidemia, unspecified: Secondary | ICD-10-CM | POA: Diagnosis not present

## 2017-05-28 DIAGNOSIS — E1159 Type 2 diabetes mellitus with other circulatory complications: Secondary | ICD-10-CM | POA: Diagnosis not present

## 2017-05-28 DIAGNOSIS — Z789 Other specified health status: Secondary | ICD-10-CM | POA: Diagnosis not present

## 2017-07-07 DIAGNOSIS — Z0000 Encounter for general adult medical examination without abnormal findings: Secondary | ICD-10-CM | POA: Diagnosis not present

## 2017-07-07 DIAGNOSIS — Z139 Encounter for screening, unspecified: Secondary | ICD-10-CM | POA: Diagnosis not present

## 2017-07-07 DIAGNOSIS — E1159 Type 2 diabetes mellitus with other circulatory complications: Secondary | ICD-10-CM | POA: Diagnosis not present

## 2017-07-07 DIAGNOSIS — Z1331 Encounter for screening for depression: Secondary | ICD-10-CM | POA: Diagnosis not present

## 2017-07-07 DIAGNOSIS — E1129 Type 2 diabetes mellitus with other diabetic kidney complication: Secondary | ICD-10-CM | POA: Diagnosis not present

## 2017-07-07 DIAGNOSIS — N185 Chronic kidney disease, stage 5: Secondary | ICD-10-CM | POA: Diagnosis not present

## 2017-07-07 DIAGNOSIS — I10 Essential (primary) hypertension: Secondary | ICD-10-CM | POA: Diagnosis not present

## 2017-07-24 DIAGNOSIS — E1169 Type 2 diabetes mellitus with other specified complication: Secondary | ICD-10-CM | POA: Diagnosis not present

## 2017-07-24 DIAGNOSIS — E785 Hyperlipidemia, unspecified: Secondary | ICD-10-CM | POA: Diagnosis not present

## 2017-07-24 DIAGNOSIS — N390 Urinary tract infection, site not specified: Secondary | ICD-10-CM | POA: Diagnosis not present

## 2017-07-24 DIAGNOSIS — R809 Proteinuria, unspecified: Secondary | ICD-10-CM | POA: Diagnosis not present

## 2017-07-24 DIAGNOSIS — N184 Chronic kidney disease, stage 4 (severe): Secondary | ICD-10-CM | POA: Diagnosis not present

## 2017-07-24 DIAGNOSIS — I10 Essential (primary) hypertension: Secondary | ICD-10-CM | POA: Diagnosis not present

## 2017-07-24 DIAGNOSIS — D649 Anemia, unspecified: Secondary | ICD-10-CM | POA: Diagnosis not present

## 2017-07-24 DIAGNOSIS — N189 Chronic kidney disease, unspecified: Secondary | ICD-10-CM | POA: Diagnosis not present

## 2017-07-24 DIAGNOSIS — E211 Secondary hyperparathyroidism, not elsewhere classified: Secondary | ICD-10-CM | POA: Diagnosis not present

## 2017-07-29 DIAGNOSIS — Z789 Other specified health status: Secondary | ICD-10-CM | POA: Diagnosis not present

## 2017-07-29 DIAGNOSIS — E1129 Type 2 diabetes mellitus with other diabetic kidney complication: Secondary | ICD-10-CM | POA: Diagnosis not present

## 2017-07-29 DIAGNOSIS — I10 Essential (primary) hypertension: Secondary | ICD-10-CM | POA: Diagnosis not present

## 2017-07-29 DIAGNOSIS — E1159 Type 2 diabetes mellitus with other circulatory complications: Secondary | ICD-10-CM | POA: Diagnosis not present

## 2017-09-25 DIAGNOSIS — R309 Painful micturition, unspecified: Secondary | ICD-10-CM | POA: Diagnosis not present

## 2017-09-25 DIAGNOSIS — E119 Type 2 diabetes mellitus without complications: Secondary | ICD-10-CM | POA: Diagnosis not present

## 2017-09-25 DIAGNOSIS — E785 Hyperlipidemia, unspecified: Secondary | ICD-10-CM | POA: Diagnosis not present

## 2017-09-25 DIAGNOSIS — E7800 Pure hypercholesterolemia, unspecified: Secondary | ICD-10-CM | POA: Diagnosis not present

## 2017-09-25 DIAGNOSIS — E1169 Type 2 diabetes mellitus with other specified complication: Secondary | ICD-10-CM | POA: Diagnosis not present

## 2017-09-25 DIAGNOSIS — I10 Essential (primary) hypertension: Secondary | ICD-10-CM | POA: Diagnosis not present

## 2017-09-25 DIAGNOSIS — N184 Chronic kidney disease, stage 4 (severe): Secondary | ICD-10-CM | POA: Diagnosis not present

## 2017-09-25 DIAGNOSIS — E872 Acidosis: Secondary | ICD-10-CM | POA: Diagnosis not present

## 2017-09-25 DIAGNOSIS — D649 Anemia, unspecified: Secondary | ICD-10-CM | POA: Diagnosis not present

## 2017-09-25 DIAGNOSIS — E211 Secondary hyperparathyroidism, not elsewhere classified: Secondary | ICD-10-CM | POA: Diagnosis not present

## 2017-09-25 DIAGNOSIS — E559 Vitamin D deficiency, unspecified: Secondary | ICD-10-CM | POA: Diagnosis not present

## 2017-09-25 DIAGNOSIS — N189 Chronic kidney disease, unspecified: Secondary | ICD-10-CM | POA: Diagnosis not present

## 2017-09-25 DIAGNOSIS — E039 Hypothyroidism, unspecified: Secondary | ICD-10-CM | POA: Diagnosis not present

## 2017-10-30 DIAGNOSIS — E1159 Type 2 diabetes mellitus with other circulatory complications: Secondary | ICD-10-CM | POA: Diagnosis not present

## 2017-10-30 DIAGNOSIS — E785 Hyperlipidemia, unspecified: Secondary | ICD-10-CM | POA: Diagnosis not present

## 2017-11-07 DIAGNOSIS — E1129 Type 2 diabetes mellitus with other diabetic kidney complication: Secondary | ICD-10-CM | POA: Diagnosis not present

## 2017-11-07 DIAGNOSIS — E1159 Type 2 diabetes mellitus with other circulatory complications: Secondary | ICD-10-CM | POA: Diagnosis not present

## 2017-11-07 DIAGNOSIS — I10 Essential (primary) hypertension: Secondary | ICD-10-CM | POA: Diagnosis not present

## 2017-11-07 DIAGNOSIS — E785 Hyperlipidemia, unspecified: Secondary | ICD-10-CM | POA: Diagnosis not present

## 2017-11-20 DIAGNOSIS — Z789 Other specified health status: Secondary | ICD-10-CM | POA: Diagnosis not present

## 2017-11-20 DIAGNOSIS — E1159 Type 2 diabetes mellitus with other circulatory complications: Secondary | ICD-10-CM | POA: Diagnosis not present

## 2017-11-20 DIAGNOSIS — I10 Essential (primary) hypertension: Secondary | ICD-10-CM | POA: Diagnosis not present

## 2017-11-20 DIAGNOSIS — E1129 Type 2 diabetes mellitus with other diabetic kidney complication: Secondary | ICD-10-CM | POA: Diagnosis not present

## 2017-11-24 DIAGNOSIS — D649 Anemia, unspecified: Secondary | ICD-10-CM | POA: Diagnosis not present

## 2017-11-24 DIAGNOSIS — E1169 Type 2 diabetes mellitus with other specified complication: Secondary | ICD-10-CM | POA: Diagnosis not present

## 2017-11-24 DIAGNOSIS — E211 Secondary hyperparathyroidism, not elsewhere classified: Secondary | ICD-10-CM | POA: Diagnosis not present

## 2017-11-24 DIAGNOSIS — E119 Type 2 diabetes mellitus without complications: Secondary | ICD-10-CM | POA: Diagnosis not present

## 2017-11-24 DIAGNOSIS — E872 Acidosis: Secondary | ICD-10-CM | POA: Diagnosis not present

## 2017-11-24 DIAGNOSIS — E785 Hyperlipidemia, unspecified: Secondary | ICD-10-CM | POA: Diagnosis not present

## 2017-11-24 DIAGNOSIS — E559 Vitamin D deficiency, unspecified: Secondary | ICD-10-CM | POA: Diagnosis not present

## 2017-11-24 DIAGNOSIS — E039 Hypothyroidism, unspecified: Secondary | ICD-10-CM | POA: Diagnosis not present

## 2017-11-24 DIAGNOSIS — N189 Chronic kidney disease, unspecified: Secondary | ICD-10-CM | POA: Diagnosis not present

## 2017-11-24 DIAGNOSIS — N184 Chronic kidney disease, stage 4 (severe): Secondary | ICD-10-CM | POA: Diagnosis not present

## 2017-11-24 DIAGNOSIS — I10 Essential (primary) hypertension: Secondary | ICD-10-CM | POA: Diagnosis not present

## 2017-12-04 DIAGNOSIS — I10 Essential (primary) hypertension: Secondary | ICD-10-CM | POA: Diagnosis not present

## 2017-12-04 DIAGNOSIS — Z789 Other specified health status: Secondary | ICD-10-CM | POA: Diagnosis not present

## 2017-12-04 DIAGNOSIS — E1159 Type 2 diabetes mellitus with other circulatory complications: Secondary | ICD-10-CM | POA: Diagnosis not present

## 2017-12-04 DIAGNOSIS — E1129 Type 2 diabetes mellitus with other diabetic kidney complication: Secondary | ICD-10-CM | POA: Diagnosis not present

## 2018-02-27 DIAGNOSIS — E785 Hyperlipidemia, unspecified: Secondary | ICD-10-CM | POA: Diagnosis not present

## 2018-02-27 DIAGNOSIS — E1159 Type 2 diabetes mellitus with other circulatory complications: Secondary | ICD-10-CM | POA: Diagnosis not present

## 2018-03-03 DIAGNOSIS — E1169 Type 2 diabetes mellitus with other specified complication: Secondary | ICD-10-CM | POA: Diagnosis not present

## 2018-03-03 DIAGNOSIS — D649 Anemia, unspecified: Secondary | ICD-10-CM | POA: Diagnosis not present

## 2018-03-03 DIAGNOSIS — E872 Acidosis: Secondary | ICD-10-CM | POA: Diagnosis not present

## 2018-03-03 DIAGNOSIS — E785 Hyperlipidemia, unspecified: Secondary | ICD-10-CM | POA: Diagnosis not present

## 2018-03-03 DIAGNOSIS — N189 Chronic kidney disease, unspecified: Secondary | ICD-10-CM | POA: Diagnosis not present

## 2018-03-03 DIAGNOSIS — E559 Vitamin D deficiency, unspecified: Secondary | ICD-10-CM | POA: Diagnosis not present

## 2018-03-03 DIAGNOSIS — I10 Essential (primary) hypertension: Secondary | ICD-10-CM | POA: Diagnosis not present

## 2018-03-03 DIAGNOSIS — N184 Chronic kidney disease, stage 4 (severe): Secondary | ICD-10-CM | POA: Diagnosis not present

## 2018-04-24 DIAGNOSIS — N189 Chronic kidney disease, unspecified: Secondary | ICD-10-CM | POA: Diagnosis not present

## 2018-04-24 DIAGNOSIS — I10 Essential (primary) hypertension: Secondary | ICD-10-CM | POA: Diagnosis not present

## 2018-04-24 DIAGNOSIS — D649 Anemia, unspecified: Secondary | ICD-10-CM | POA: Diagnosis not present

## 2018-04-24 DIAGNOSIS — E1169 Type 2 diabetes mellitus with other specified complication: Secondary | ICD-10-CM | POA: Diagnosis not present

## 2018-04-24 DIAGNOSIS — E872 Acidosis: Secondary | ICD-10-CM | POA: Diagnosis not present

## 2018-04-24 DIAGNOSIS — E211 Secondary hyperparathyroidism, not elsewhere classified: Secondary | ICD-10-CM | POA: Diagnosis not present

## 2018-04-24 DIAGNOSIS — N184 Chronic kidney disease, stage 4 (severe): Secondary | ICD-10-CM | POA: Diagnosis not present

## 2018-04-24 DIAGNOSIS — E785 Hyperlipidemia, unspecified: Secondary | ICD-10-CM | POA: Diagnosis not present

## 2018-05-14 DIAGNOSIS — N189 Chronic kidney disease, unspecified: Secondary | ICD-10-CM | POA: Diagnosis not present

## 2018-07-13 DIAGNOSIS — E1169 Type 2 diabetes mellitus with other specified complication: Secondary | ICD-10-CM | POA: Diagnosis not present

## 2018-07-13 DIAGNOSIS — D649 Anemia, unspecified: Secondary | ICD-10-CM | POA: Diagnosis not present

## 2018-07-13 DIAGNOSIS — E119 Type 2 diabetes mellitus without complications: Secondary | ICD-10-CM | POA: Diagnosis not present

## 2018-07-13 DIAGNOSIS — E872 Acidosis: Secondary | ICD-10-CM | POA: Diagnosis not present

## 2018-07-13 DIAGNOSIS — N189 Chronic kidney disease, unspecified: Secondary | ICD-10-CM | POA: Diagnosis not present

## 2018-07-13 DIAGNOSIS — E211 Secondary hyperparathyroidism, not elsewhere classified: Secondary | ICD-10-CM | POA: Diagnosis not present

## 2018-07-13 DIAGNOSIS — N184 Chronic kidney disease, stage 4 (severe): Secondary | ICD-10-CM | POA: Diagnosis not present

## 2018-07-13 DIAGNOSIS — E785 Hyperlipidemia, unspecified: Secondary | ICD-10-CM | POA: Diagnosis not present

## 2018-07-13 DIAGNOSIS — E559 Vitamin D deficiency, unspecified: Secondary | ICD-10-CM | POA: Diagnosis not present

## 2018-07-13 DIAGNOSIS — I10 Essential (primary) hypertension: Secondary | ICD-10-CM | POA: Diagnosis not present

## 2018-07-20 DIAGNOSIS — E785 Hyperlipidemia, unspecified: Secondary | ICD-10-CM | POA: Diagnosis not present

## 2018-07-20 DIAGNOSIS — E1159 Type 2 diabetes mellitus with other circulatory complications: Secondary | ICD-10-CM | POA: Diagnosis not present

## 2018-07-27 DIAGNOSIS — I10 Essential (primary) hypertension: Secondary | ICD-10-CM | POA: Diagnosis not present

## 2018-07-27 DIAGNOSIS — Z0000 Encounter for general adult medical examination without abnormal findings: Secondary | ICD-10-CM | POA: Diagnosis not present

## 2018-07-27 DIAGNOSIS — E1159 Type 2 diabetes mellitus with other circulatory complications: Secondary | ICD-10-CM | POA: Diagnosis not present

## 2018-07-27 DIAGNOSIS — N185 Chronic kidney disease, stage 5: Secondary | ICD-10-CM | POA: Diagnosis not present

## 2018-07-27 DIAGNOSIS — E785 Hyperlipidemia, unspecified: Secondary | ICD-10-CM | POA: Diagnosis not present

## 2018-09-11 DIAGNOSIS — I10 Essential (primary) hypertension: Secondary | ICD-10-CM | POA: Diagnosis not present

## 2018-09-11 DIAGNOSIS — R309 Painful micturition, unspecified: Secondary | ICD-10-CM | POA: Diagnosis not present

## 2018-09-11 DIAGNOSIS — E872 Acidosis: Secondary | ICD-10-CM | POA: Diagnosis not present

## 2018-09-11 DIAGNOSIS — R809 Proteinuria, unspecified: Secondary | ICD-10-CM | POA: Diagnosis not present

## 2018-09-11 DIAGNOSIS — N189 Chronic kidney disease, unspecified: Secondary | ICD-10-CM | POA: Diagnosis not present

## 2018-09-11 DIAGNOSIS — D649 Anemia, unspecified: Secondary | ICD-10-CM | POA: Diagnosis not present

## 2018-09-11 DIAGNOSIS — E559 Vitamin D deficiency, unspecified: Secondary | ICD-10-CM | POA: Diagnosis not present

## 2018-09-11 DIAGNOSIS — N184 Chronic kidney disease, stage 4 (severe): Secondary | ICD-10-CM | POA: Diagnosis not present

## 2018-09-11 DIAGNOSIS — E785 Hyperlipidemia, unspecified: Secondary | ICD-10-CM | POA: Diagnosis not present

## 2018-09-11 DIAGNOSIS — E1169 Type 2 diabetes mellitus with other specified complication: Secondary | ICD-10-CM | POA: Diagnosis not present

## 2018-11-18 DIAGNOSIS — E1129 Type 2 diabetes mellitus with other diabetic kidney complication: Secondary | ICD-10-CM | POA: Diagnosis not present

## 2018-11-18 DIAGNOSIS — E1159 Type 2 diabetes mellitus with other circulatory complications: Secondary | ICD-10-CM | POA: Diagnosis not present

## 2018-11-18 DIAGNOSIS — E785 Hyperlipidemia, unspecified: Secondary | ICD-10-CM | POA: Diagnosis not present

## 2018-11-20 DIAGNOSIS — I10 Essential (primary) hypertension: Secondary | ICD-10-CM | POA: Diagnosis not present

## 2018-11-20 DIAGNOSIS — N184 Chronic kidney disease, stage 4 (severe): Secondary | ICD-10-CM | POA: Diagnosis not present

## 2018-11-20 DIAGNOSIS — E1169 Type 2 diabetes mellitus with other specified complication: Secondary | ICD-10-CM | POA: Diagnosis not present

## 2018-11-20 DIAGNOSIS — E872 Acidosis: Secondary | ICD-10-CM | POA: Diagnosis not present

## 2018-11-20 DIAGNOSIS — E785 Hyperlipidemia, unspecified: Secondary | ICD-10-CM | POA: Diagnosis not present

## 2018-11-25 DIAGNOSIS — N184 Chronic kidney disease, stage 4 (severe): Secondary | ICD-10-CM | POA: Diagnosis not present

## 2018-11-25 DIAGNOSIS — E785 Hyperlipidemia, unspecified: Secondary | ICD-10-CM | POA: Diagnosis not present

## 2018-11-25 DIAGNOSIS — Z7189 Other specified counseling: Secondary | ICD-10-CM | POA: Diagnosis not present

## 2018-11-25 DIAGNOSIS — E1129 Type 2 diabetes mellitus with other diabetic kidney complication: Secondary | ICD-10-CM | POA: Diagnosis not present

## 2018-11-25 DIAGNOSIS — E1159 Type 2 diabetes mellitus with other circulatory complications: Secondary | ICD-10-CM | POA: Diagnosis not present

## 2019-02-24 DIAGNOSIS — E1159 Type 2 diabetes mellitus with other circulatory complications: Secondary | ICD-10-CM | POA: Diagnosis not present

## 2019-02-24 DIAGNOSIS — Z0000 Encounter for general adult medical examination without abnormal findings: Secondary | ICD-10-CM | POA: Diagnosis not present

## 2019-02-24 DIAGNOSIS — Z139 Encounter for screening, unspecified: Secondary | ICD-10-CM | POA: Diagnosis not present

## 2019-02-24 DIAGNOSIS — I10 Essential (primary) hypertension: Secondary | ICD-10-CM | POA: Diagnosis not present

## 2019-05-26 DIAGNOSIS — S88111S Complete traumatic amputation at level between knee and ankle, right lower leg, sequela: Secondary | ICD-10-CM | POA: Diagnosis not present

## 2019-05-26 DIAGNOSIS — E1159 Type 2 diabetes mellitus with other circulatory complications: Secondary | ICD-10-CM | POA: Diagnosis not present

## 2019-05-26 DIAGNOSIS — I10 Essential (primary) hypertension: Secondary | ICD-10-CM | POA: Diagnosis not present

## 2019-06-08 DIAGNOSIS — I10 Essential (primary) hypertension: Secondary | ICD-10-CM | POA: Diagnosis not present

## 2019-06-08 DIAGNOSIS — E559 Vitamin D deficiency, unspecified: Secondary | ICD-10-CM | POA: Diagnosis not present

## 2019-06-08 DIAGNOSIS — N184 Chronic kidney disease, stage 4 (severe): Secondary | ICD-10-CM | POA: Diagnosis not present

## 2019-06-08 DIAGNOSIS — D649 Anemia, unspecified: Secondary | ICD-10-CM | POA: Diagnosis not present

## 2019-06-08 DIAGNOSIS — N189 Chronic kidney disease, unspecified: Secondary | ICD-10-CM | POA: Diagnosis not present

## 2019-06-08 DIAGNOSIS — E1169 Type 2 diabetes mellitus with other specified complication: Secondary | ICD-10-CM | POA: Diagnosis not present

## 2019-06-08 DIAGNOSIS — E211 Secondary hyperparathyroidism, not elsewhere classified: Secondary | ICD-10-CM | POA: Diagnosis not present

## 2019-06-08 DIAGNOSIS — E872 Acidosis: Secondary | ICD-10-CM | POA: Diagnosis not present

## 2019-06-08 DIAGNOSIS — E785 Hyperlipidemia, unspecified: Secondary | ICD-10-CM | POA: Diagnosis not present

## 2019-09-15 ENCOUNTER — Inpatient Hospital Stay (HOSPITAL_COMMUNITY)
Admission: AD | Admit: 2019-09-15 | Discharge: 2019-10-23 | DRG: 981 | Disposition: A | Discharge status: Skilled Nursing Facility | Payer: Medicare Other | Source: Other Acute Inpatient Hospital | Attending: Internal Medicine | Admitting: Internal Medicine

## 2019-09-15 ENCOUNTER — Inpatient Hospital Stay (HOSPITAL_COMMUNITY): Payer: Medicare Other

## 2019-09-15 DIAGNOSIS — S59901A Unspecified injury of right elbow, initial encounter: Secondary | ICD-10-CM | POA: Diagnosis not present

## 2019-09-15 DIAGNOSIS — S41111A Laceration without foreign body of right upper arm, initial encounter: Secondary | ICD-10-CM | POA: Diagnosis present

## 2019-09-15 DIAGNOSIS — D631 Anemia in chronic kidney disease: Secondary | ICD-10-CM | POA: Diagnosis present

## 2019-09-15 DIAGNOSIS — N179 Acute kidney failure, unspecified: Principal | ICD-10-CM | POA: Diagnosis present

## 2019-09-15 DIAGNOSIS — E874 Mixed disorder of acid-base balance: Secondary | ICD-10-CM | POA: Diagnosis not present

## 2019-09-15 DIAGNOSIS — I120 Hypertensive chronic kidney disease with stage 5 chronic kidney disease or end stage renal disease: Secondary | ICD-10-CM | POA: Diagnosis present

## 2019-09-15 DIAGNOSIS — K5900 Constipation, unspecified: Secondary | ICD-10-CM | POA: Diagnosis not present

## 2019-09-15 DIAGNOSIS — I10 Essential (primary) hypertension: Secondary | ICD-10-CM | POA: Diagnosis not present

## 2019-09-15 DIAGNOSIS — E875 Hyperkalemia: Secondary | ICD-10-CM | POA: Diagnosis not present

## 2019-09-15 DIAGNOSIS — J9601 Acute respiratory failure with hypoxia: Secondary | ICD-10-CM

## 2019-09-15 DIAGNOSIS — N185 Chronic kidney disease, stage 5: Secondary | ICD-10-CM | POA: Diagnosis not present

## 2019-09-15 DIAGNOSIS — R059 Cough, unspecified: Secondary | ICD-10-CM

## 2019-09-15 DIAGNOSIS — Z9289 Personal history of other medical treatment: Secondary | ICD-10-CM

## 2019-09-15 DIAGNOSIS — W1830XA Fall on same level, unspecified, initial encounter: Secondary | ICD-10-CM | POA: Diagnosis present

## 2019-09-15 DIAGNOSIS — E861 Hypovolemia: Secondary | ICD-10-CM | POA: Diagnosis not present

## 2019-09-15 DIAGNOSIS — K219 Gastro-esophageal reflux disease without esophagitis: Secondary | ICD-10-CM | POA: Diagnosis not present

## 2019-09-15 DIAGNOSIS — Z89511 Acquired absence of right leg below knee: Secondary | ICD-10-CM | POA: Diagnosis not present

## 2019-09-15 DIAGNOSIS — I129 Hypertensive chronic kidney disease with stage 1 through stage 4 chronic kidney disease, or unspecified chronic kidney disease: Secondary | ICD-10-CM | POA: Diagnosis not present

## 2019-09-15 DIAGNOSIS — R0602 Shortness of breath: Secondary | ICD-10-CM | POA: Diagnosis not present

## 2019-09-15 DIAGNOSIS — S3991XA Unspecified injury of abdomen, initial encounter: Secondary | ICD-10-CM | POA: Diagnosis not present

## 2019-09-15 DIAGNOSIS — Z96611 Presence of right artificial shoulder joint: Secondary | ICD-10-CM

## 2019-09-15 DIAGNOSIS — Z7401 Bed confinement status: Secondary | ICD-10-CM | POA: Diagnosis not present

## 2019-09-15 DIAGNOSIS — E785 Hyperlipidemia, unspecified: Secondary | ICD-10-CM | POA: Diagnosis present

## 2019-09-15 DIAGNOSIS — Z03818 Encounter for observation for suspected exposure to other biological agents ruled out: Secondary | ICD-10-CM | POA: Diagnosis not present

## 2019-09-15 DIAGNOSIS — I959 Hypotension, unspecified: Secondary | ICD-10-CM | POA: Diagnosis present

## 2019-09-15 DIAGNOSIS — J151 Pneumonia due to Pseudomonas: Secondary | ICD-10-CM | POA: Diagnosis not present

## 2019-09-15 DIAGNOSIS — Z743 Need for continuous supervision: Secondary | ICD-10-CM | POA: Diagnosis not present

## 2019-09-15 DIAGNOSIS — J9600 Acute respiratory failure, unspecified whether with hypoxia or hypercapnia: Secondary | ICD-10-CM | POA: Diagnosis present

## 2019-09-15 DIAGNOSIS — L8990 Pressure ulcer of unspecified site, unspecified stage: Secondary | ICD-10-CM | POA: Insufficient documentation

## 2019-09-15 DIAGNOSIS — S6991XA Unspecified injury of right wrist, hand and finger(s), initial encounter: Secondary | ICD-10-CM | POA: Diagnosis not present

## 2019-09-15 DIAGNOSIS — E860 Dehydration: Secondary | ICD-10-CM | POA: Diagnosis present

## 2019-09-15 DIAGNOSIS — Z01818 Encounter for other preprocedural examination: Secondary | ICD-10-CM

## 2019-09-15 DIAGNOSIS — Z452 Encounter for adjustment and management of vascular access device: Secondary | ICD-10-CM | POA: Diagnosis not present

## 2019-09-15 DIAGNOSIS — S42201D Unspecified fracture of upper end of right humerus, subsequent encounter for fracture with routine healing: Secondary | ICD-10-CM | POA: Diagnosis not present

## 2019-09-15 DIAGNOSIS — J9690 Respiratory failure, unspecified, unspecified whether with hypoxia or hypercapnia: Secondary | ICD-10-CM

## 2019-09-15 DIAGNOSIS — Z8673 Personal history of transient ischemic attack (TIA), and cerebral infarction without residual deficits: Secondary | ICD-10-CM | POA: Diagnosis not present

## 2019-09-15 DIAGNOSIS — T148XXA Other injury of unspecified body region, initial encounter: Secondary | ICD-10-CM | POA: Diagnosis not present

## 2019-09-15 DIAGNOSIS — Z431 Encounter for attention to gastrostomy: Secondary | ICD-10-CM

## 2019-09-15 DIAGNOSIS — Z6824 Body mass index (BMI) 24.0-24.9, adult: Secondary | ICD-10-CM

## 2019-09-15 DIAGNOSIS — R64 Cachexia: Secondary | ICD-10-CM | POA: Diagnosis not present

## 2019-09-15 DIAGNOSIS — R531 Weakness: Secondary | ICD-10-CM | POA: Diagnosis not present

## 2019-09-15 DIAGNOSIS — S0990XA Unspecified injury of head, initial encounter: Secondary | ICD-10-CM | POA: Diagnosis not present

## 2019-09-15 DIAGNOSIS — N390 Urinary tract infection, site not specified: Secondary | ICD-10-CM | POA: Diagnosis not present

## 2019-09-15 DIAGNOSIS — Z79890 Hormone replacement therapy (postmenopausal): Secondary | ICD-10-CM

## 2019-09-15 DIAGNOSIS — Z0000 Encounter for general adult medical examination without abnormal findings: Secondary | ICD-10-CM

## 2019-09-15 DIAGNOSIS — E870 Hyperosmolality and hypernatremia: Secondary | ICD-10-CM | POA: Diagnosis present

## 2019-09-15 DIAGNOSIS — E1151 Type 2 diabetes mellitus with diabetic peripheral angiopathy without gangrene: Secondary | ICD-10-CM | POA: Diagnosis present

## 2019-09-15 DIAGNOSIS — N184 Chronic kidney disease, stage 4 (severe): Secondary | ICD-10-CM | POA: Diagnosis not present

## 2019-09-15 DIAGNOSIS — S42201A Unspecified fracture of upper end of right humerus, initial encounter for closed fracture: Secondary | ICD-10-CM | POA: Diagnosis not present

## 2019-09-15 DIAGNOSIS — J181 Lobar pneumonia, unspecified organism: Secondary | ICD-10-CM | POA: Diagnosis not present

## 2019-09-15 DIAGNOSIS — M7989 Other specified soft tissue disorders: Secondary | ICD-10-CM | POA: Diagnosis not present

## 2019-09-15 DIAGNOSIS — S59911A Unspecified injury of right forearm, initial encounter: Secondary | ICD-10-CM | POA: Diagnosis not present

## 2019-09-15 DIAGNOSIS — Y95 Nosocomial condition: Secondary | ICD-10-CM | POA: Diagnosis not present

## 2019-09-15 DIAGNOSIS — R231 Pallor: Secondary | ICD-10-CM | POA: Diagnosis not present

## 2019-09-15 DIAGNOSIS — Q842 Other congenital malformations of hair: Secondary | ICD-10-CM | POA: Diagnosis not present

## 2019-09-15 DIAGNOSIS — B9620 Unspecified Escherichia coli [E. coli] as the cause of diseases classified elsewhere: Secondary | ICD-10-CM | POA: Diagnosis not present

## 2019-09-15 DIAGNOSIS — R627 Adult failure to thrive: Secondary | ICD-10-CM | POA: Diagnosis present

## 2019-09-15 DIAGNOSIS — S42241A 4-part fracture of surgical neck of right humerus, initial encounter for closed fracture: Secondary | ICD-10-CM | POA: Diagnosis not present

## 2019-09-15 DIAGNOSIS — S42291A Other displaced fracture of upper end of right humerus, initial encounter for closed fracture: Secondary | ICD-10-CM | POA: Diagnosis not present

## 2019-09-15 DIAGNOSIS — K567 Ileus, unspecified: Secondary | ICD-10-CM | POA: Diagnosis not present

## 2019-09-15 DIAGNOSIS — R1310 Dysphagia, unspecified: Secondary | ICD-10-CM | POA: Diagnosis not present

## 2019-09-15 DIAGNOSIS — J690 Pneumonitis due to inhalation of food and vomit: Secondary | ICD-10-CM | POA: Diagnosis not present

## 2019-09-15 DIAGNOSIS — Z23 Encounter for immunization: Secondary | ICD-10-CM

## 2019-09-15 DIAGNOSIS — E876 Hypokalemia: Secondary | ICD-10-CM | POA: Diagnosis not present

## 2019-09-15 DIAGNOSIS — J189 Pneumonia, unspecified organism: Secondary | ICD-10-CM | POA: Diagnosis not present

## 2019-09-15 DIAGNOSIS — R05 Cough: Secondary | ICD-10-CM

## 2019-09-15 DIAGNOSIS — E1122 Type 2 diabetes mellitus with diabetic chronic kidney disease: Secondary | ICD-10-CM | POA: Diagnosis not present

## 2019-09-15 DIAGNOSIS — S5291XA Unspecified fracture of right forearm, initial encounter for closed fracture: Secondary | ICD-10-CM | POA: Diagnosis not present

## 2019-09-15 DIAGNOSIS — Z8679 Personal history of other diseases of the circulatory system: Secondary | ICD-10-CM

## 2019-09-15 DIAGNOSIS — S299XXA Unspecified injury of thorax, initial encounter: Secondary | ICD-10-CM | POA: Diagnosis not present

## 2019-09-15 DIAGNOSIS — T17908A Unspecified foreign body in respiratory tract, part unspecified causing other injury, initial encounter: Secondary | ICD-10-CM

## 2019-09-15 DIAGNOSIS — Z4659 Encounter for fitting and adjustment of other gastrointestinal appliance and device: Secondary | ICD-10-CM | POA: Diagnosis not present

## 2019-09-15 DIAGNOSIS — D7582 Heparin induced thrombocytopenia (HIT): Secondary | ICD-10-CM

## 2019-09-15 DIAGNOSIS — J9602 Acute respiratory failure with hypercapnia: Secondary | ICD-10-CM | POA: Diagnosis not present

## 2019-09-15 DIAGNOSIS — L89892 Pressure ulcer of other site, stage 2: Secondary | ICD-10-CM | POA: Diagnosis present

## 2019-09-15 DIAGNOSIS — Z20822 Contact with and (suspected) exposure to covid-19: Secondary | ICD-10-CM | POA: Diagnosis present

## 2019-09-15 DIAGNOSIS — D75829 Heparin-induced thrombocytopenia, unspecified: Secondary | ICD-10-CM

## 2019-09-15 DIAGNOSIS — S199XXA Unspecified injury of neck, initial encounter: Secondary | ICD-10-CM | POA: Diagnosis not present

## 2019-09-15 DIAGNOSIS — Z515 Encounter for palliative care: Secondary | ICD-10-CM | POA: Diagnosis not present

## 2019-09-15 DIAGNOSIS — R112 Nausea with vomiting, unspecified: Secondary | ICD-10-CM | POA: Diagnosis not present

## 2019-09-15 DIAGNOSIS — K5989 Other specified functional intestinal disorders: Secondary | ICD-10-CM | POA: Diagnosis not present

## 2019-09-15 DIAGNOSIS — I6522 Occlusion and stenosis of left carotid artery: Secondary | ICD-10-CM | POA: Diagnosis present

## 2019-09-15 DIAGNOSIS — E43 Unspecified severe protein-calorie malnutrition: Secondary | ICD-10-CM | POA: Diagnosis not present

## 2019-09-15 DIAGNOSIS — S4992XA Unspecified injury of left shoulder and upper arm, initial encounter: Secondary | ICD-10-CM | POA: Diagnosis not present

## 2019-09-15 DIAGNOSIS — M6282 Rhabdomyolysis: Secondary | ICD-10-CM

## 2019-09-15 DIAGNOSIS — G9340 Encephalopathy, unspecified: Secondary | ICD-10-CM | POA: Diagnosis not present

## 2019-09-15 DIAGNOSIS — R609 Edema, unspecified: Secondary | ICD-10-CM | POA: Diagnosis not present

## 2019-09-15 DIAGNOSIS — Y92009 Unspecified place in unspecified non-institutional (private) residence as the place of occurrence of the external cause: Secondary | ICD-10-CM | POA: Diagnosis not present

## 2019-09-15 DIAGNOSIS — S42351A Displaced comminuted fracture of shaft of humerus, right arm, initial encounter for closed fracture: Secondary | ICD-10-CM | POA: Diagnosis not present

## 2019-09-15 DIAGNOSIS — G9341 Metabolic encephalopathy: Secondary | ICD-10-CM | POA: Diagnosis not present

## 2019-09-15 DIAGNOSIS — Z7189 Other specified counseling: Secondary | ICD-10-CM | POA: Diagnosis not present

## 2019-09-15 DIAGNOSIS — E1165 Type 2 diabetes mellitus with hyperglycemia: Secondary | ICD-10-CM | POA: Diagnosis present

## 2019-09-15 DIAGNOSIS — K449 Diaphragmatic hernia without obstruction or gangrene: Secondary | ICD-10-CM | POA: Diagnosis not present

## 2019-09-15 DIAGNOSIS — R0902 Hypoxemia: Secondary | ICD-10-CM | POA: Diagnosis not present

## 2019-09-15 DIAGNOSIS — Z471 Aftercare following joint replacement surgery: Secondary | ICD-10-CM | POA: Diagnosis not present

## 2019-09-15 DIAGNOSIS — W19XXXA Unspecified fall, initial encounter: Secondary | ICD-10-CM | POA: Diagnosis not present

## 2019-09-15 DIAGNOSIS — Z4682 Encounter for fitting and adjustment of non-vascular catheter: Secondary | ICD-10-CM | POA: Diagnosis not present

## 2019-09-15 DIAGNOSIS — M2550 Pain in unspecified joint: Secondary | ICD-10-CM | POA: Diagnosis not present

## 2019-09-15 DIAGNOSIS — A419 Sepsis, unspecified organism: Secondary | ICD-10-CM | POA: Diagnosis not present

## 2019-09-15 DIAGNOSIS — T17908S Unspecified foreign body in respiratory tract, part unspecified causing other injury, sequela: Secondary | ICD-10-CM | POA: Diagnosis not present

## 2019-09-15 DIAGNOSIS — R6521 Severe sepsis with septic shock: Secondary | ICD-10-CM | POA: Diagnosis not present

## 2019-09-15 DIAGNOSIS — Z794 Long term (current) use of insulin: Secondary | ICD-10-CM

## 2019-09-15 DIAGNOSIS — R918 Other nonspecific abnormal finding of lung field: Secondary | ICD-10-CM | POA: Diagnosis not present

## 2019-09-15 DIAGNOSIS — Z79899 Other long term (current) drug therapy: Secondary | ICD-10-CM

## 2019-09-15 DIAGNOSIS — Z9119 Patient's noncompliance with other medical treatment and regimen: Secondary | ICD-10-CM

## 2019-09-15 DIAGNOSIS — Z9713 Presence of artificial right leg (complete) (partial): Secondary | ICD-10-CM

## 2019-09-15 DIAGNOSIS — Z0189 Encounter for other specified special examinations: Secondary | ICD-10-CM

## 2019-09-15 DIAGNOSIS — D649 Anemia, unspecified: Secondary | ICD-10-CM | POA: Diagnosis not present

## 2019-09-15 LAB — URINALYSIS, ROUTINE W REFLEX MICROSCOPIC
Bilirubin Urine: NEGATIVE
Glucose, UA: 50 mg/dL — AB
Ketones, ur: NEGATIVE mg/dL
Nitrite: NEGATIVE
Protein, ur: 100 mg/dL — AB
RBC / HPF: >50 RBC/hpf — ABNORMAL HIGH (ref 0–5)
Specific Gravity, Urine: 1.01 (ref 1.005–1.030)
WBC, UA: >50 WBC/hpf — ABNORMAL HIGH (ref 0–5)
pH: 5 (ref 5.0–8.0)

## 2019-09-15 LAB — ABO/RH: ABO/RH(D): O POS

## 2019-09-15 LAB — BASIC METABOLIC PANEL
Anion gap: 17 — ABNORMAL HIGH (ref 5–15)
BUN: 170 mg/dL — ABNORMAL HIGH (ref 8–23)
CO2: 12 mmol/L — ABNORMAL LOW (ref 22–32)
Calcium: 7 mg/dL — ABNORMAL LOW (ref 8.9–10.3)
Chloride: 122 mmol/L — ABNORMAL HIGH (ref 98–111)
Creatinine, Ser: 9.68 mg/dL — ABNORMAL HIGH (ref 0.61–1.24)
GFR calc Af Amer: 6 mL/min — ABNORMAL LOW (ref >60)
GFR calc non Af Amer: 5 mL/min — ABNORMAL LOW (ref >60)
Glucose, Bld: 211 mg/dL — ABNORMAL HIGH (ref 70–99)
Potassium: 5.8 mmol/L — ABNORMAL HIGH (ref 3.5–5.1)
Sodium: 151 mmol/L — ABNORMAL HIGH (ref 135–145)

## 2019-09-15 LAB — HEMOGLOBIN A1C
Hgb A1c MFr Bld: 5.4 % (ref 4.8–5.6)
Mean Plasma Glucose: 108.28 mg/dL

## 2019-09-15 LAB — POCT I-STAT 7, (LYTES, BLD GAS, ICA,H+H)
Acid-base deficit: 13 mmol/L — ABNORMAL HIGH (ref 0.0–2.0)
Bicarbonate: 12.5 mmol/L — ABNORMAL LOW (ref 20.0–28.0)
Calcium, Ion: 1.03 mmol/L — ABNORMAL LOW (ref 1.15–1.40)
HCT: 18 % — ABNORMAL LOW (ref 39.0–52.0)
Hemoglobin: 6.1 g/dL — CL (ref 13.0–17.0)
O2 Saturation: 100 %
Patient temperature: 36.7
Potassium: 5.7 mmol/L — ABNORMAL HIGH (ref 3.5–5.1)
Sodium: 149 mmol/L — ABNORMAL HIGH (ref 135–145)
TCO2: 13 mmol/L — ABNORMAL LOW (ref 22–32)
pCO2 arterial: 26.1 mmHg — ABNORMAL LOW (ref 32.0–48.0)
pH, Arterial: 7.285 — ABNORMAL LOW (ref 7.350–7.450)
pO2, Arterial: 354 mmHg — ABNORMAL HIGH (ref 83.0–108.0)

## 2019-09-15 LAB — GLUCOSE, CAPILLARY
Glucose-Capillary: 180 mg/dL — ABNORMAL HIGH (ref 70–99)
Glucose-Capillary: 150 mg/dL — ABNORMAL HIGH (ref 70–99)
Glucose-Capillary: 179 mg/dL — ABNORMAL HIGH (ref 70–99)

## 2019-09-15 LAB — CBC WITH DIFFERENTIAL/PLATELET
Abs Immature Granulocytes: 0 10*3/uL (ref 0.00–0.07)
Basophils Absolute: 0 10*3/uL (ref 0.0–0.1)
Basophils Relative: 0 %
Eosinophils Absolute: 0 10*3/uL (ref 0.0–0.5)
Eosinophils Relative: 0 %
HCT: 21.3 % — ABNORMAL LOW (ref 39.0–52.0)
Hemoglobin: 6.8 g/dL — CL (ref 13.0–17.0)
Lymphocytes Relative: 14 %
Lymphs Abs: 0.6 10*3/uL — ABNORMAL LOW (ref 0.7–4.0)
MCH: 30.1 pg (ref 26.0–34.0)
MCHC: 31.9 g/dL (ref 30.0–36.0)
MCV: 94.2 fL (ref 80.0–100.0)
Monocytes Absolute: 0 10*3/uL — ABNORMAL LOW (ref 0.1–1.0)
Monocytes Relative: 1 %
Neutro Abs: 3.9 10*3/uL (ref 1.7–7.7)
Neutrophils Relative %: 85 %
Platelets: 190 10*3/uL (ref 150–400)
RBC: 2.26 MIL/uL — ABNORMAL LOW (ref 4.22–5.81)
RDW: 14.3 % (ref 11.5–15.5)
WBC: 4.6 10*3/uL (ref 4.0–10.5)
nRBC: 0.4 % — ABNORMAL HIGH (ref 0.0–0.2)
nRBC: 1 /100 WBC — ABNORMAL HIGH

## 2019-09-15 LAB — MRSA PCR SCREENING: MRSA by PCR: POSITIVE — AB

## 2019-09-15 LAB — CORTISOL: Cortisol, Plasma: 61 ug/dL

## 2019-09-15 LAB — LACTIC ACID, PLASMA
Lactic Acid, Venous: 1.2 mmol/L (ref 0.5–1.9)
Lactic Acid, Venous: 1.3 mmol/L (ref 0.5–1.9)

## 2019-09-15 LAB — PROCALCITONIN: Procalcitonin: 2.92 ng/mL

## 2019-09-15 LAB — MAGNESIUM: Magnesium: 2.1 mg/dL (ref 1.7–2.4)

## 2019-09-15 LAB — PREPARE RBC (CROSSMATCH)

## 2019-09-15 LAB — BRAIN NATRIURETIC PEPTIDE: B Natriuretic Peptide: 61.3 pg/mL (ref 0.0–100.0)

## 2019-09-15 LAB — CK: Total CK: 1452 U/L — ABNORMAL HIGH (ref 49–397)

## 2019-09-15 LAB — HIV ANTIBODY (ROUTINE TESTING W REFLEX): HIV Screen 4th Generation wRfx: NONREACTIVE

## 2019-09-15 LAB — PHOSPHORUS: Phosphorus: 10.4 mg/dL — ABNORMAL HIGH (ref 2.5–4.6)

## 2019-09-15 MED ORDER — PANTOPRAZOLE SODIUM 40 MG IV SOLR
40.0000 mg | Freq: Every day | INTRAVENOUS | Status: DC
  Administered 2019-09-15 – 2019-09-18 (×4): 40 mg via INTRAVENOUS
  Filled 2019-09-15 (×4): qty 40

## 2019-09-15 MED ORDER — CHLORHEXIDINE GLUCONATE CLOTH 2 % EX PADS
6.0000 | MEDICATED_PAD | Freq: Every day | CUTANEOUS | Status: DC
  Administered 2019-09-15 – 2019-10-23 (×33): 6 via TOPICAL

## 2019-09-15 MED ORDER — CHLORHEXIDINE GLUCONATE 0.12% ORAL RINSE (MEDLINE KIT)
15.0000 mL | Freq: Two times a day (BID) | OROMUCOSAL | Status: DC
  Administered 2019-09-16 – 2019-10-01 (×28): 15 mL via OROMUCOSAL

## 2019-09-15 MED ORDER — INSULIN ASPART 100 UNIT/ML ~~LOC~~ SOLN
0.0000 [IU] | SUBCUTANEOUS | Status: DC
  Administered 2019-09-15: 2 [IU] via SUBCUTANEOUS
  Administered 2019-09-16 (×2): 3 [IU] via SUBCUTANEOUS
  Administered 2019-09-16: 20:00:00 5 [IU] via SUBCUTANEOUS
  Administered 2019-09-16: 7 [IU] via SUBCUTANEOUS
  Administered 2019-09-16: 08:00:00 3 [IU] via SUBCUTANEOUS
  Administered 2019-09-16 (×2): 2 [IU] via SUBCUTANEOUS
  Administered 2019-09-17: 1 [IU] via SUBCUTANEOUS
  Administered 2019-09-17 (×2): 2 [IU] via SUBCUTANEOUS
  Administered 2019-09-17: 04:00:00 3 [IU] via SUBCUTANEOUS
  Administered 2019-09-17: 1 [IU] via SUBCUTANEOUS
  Administered 2019-09-18 (×2): 2 [IU] via SUBCUTANEOUS
  Administered 2019-09-18 (×2): 1 [IU] via SUBCUTANEOUS
  Administered 2019-09-18: 2 [IU] via SUBCUTANEOUS
  Administered 2019-09-19: 3 [IU] via SUBCUTANEOUS
  Administered 2019-09-19 (×2): 2 [IU] via SUBCUTANEOUS
  Administered 2019-09-19: 3 [IU] via SUBCUTANEOUS
  Administered 2019-09-19: 2 [IU] via SUBCUTANEOUS
  Administered 2019-09-19: 5 [IU] via SUBCUTANEOUS
  Administered 2019-09-20 (×6): 3 [IU] via SUBCUTANEOUS
  Administered 2019-09-20: 2 [IU] via SUBCUTANEOUS
  Administered 2019-09-21 (×2): 5 [IU] via SUBCUTANEOUS
  Administered 2019-09-21: 3 [IU] via SUBCUTANEOUS
  Administered 2019-09-21: 5 [IU] via SUBCUTANEOUS
  Administered 2019-09-22 (×2): 1 [IU] via SUBCUTANEOUS
  Administered 2019-09-22: 2 [IU] via SUBCUTANEOUS
  Administered 2019-09-23: 3 [IU] via SUBCUTANEOUS
  Administered 2019-09-23 (×2): 2 [IU] via SUBCUTANEOUS
  Administered 2019-09-23: 5 [IU] via SUBCUTANEOUS
  Administered 2019-09-23 – 2019-09-24 (×3): 3 [IU] via SUBCUTANEOUS
  Administered 2019-09-24 (×2): 2 [IU] via SUBCUTANEOUS
  Administered 2019-09-24: 3 [IU] via SUBCUTANEOUS
  Administered 2019-09-24: 2 [IU] via SUBCUTANEOUS
  Administered 2019-09-25 (×2): 1 [IU] via SUBCUTANEOUS

## 2019-09-15 MED ORDER — HYDROCORTISONE NA SUCCINATE PF 100 MG IJ SOLR
50.0000 mg | Freq: Four times a day (QID) | INTRAMUSCULAR | Status: DC
  Administered 2019-09-15 – 2019-09-19 (×15): 50 mg via INTRAVENOUS
  Filled 2019-09-15 (×16): qty 2

## 2019-09-15 MED ORDER — HEPARIN SODIUM (PORCINE) 5000 UNIT/ML IJ SOLN
5000.0000 [IU] | Freq: Three times a day (TID) | INTRAMUSCULAR | Status: DC
  Administered 2019-09-15 – 2019-09-21 (×18): 5000 [IU] via SUBCUTANEOUS
  Filled 2019-09-15 (×17): qty 1

## 2019-09-15 MED ORDER — DOCUSATE SODIUM 50 MG/5ML PO LIQD
100.0000 mg | Freq: Two times a day (BID) | ORAL | Status: DC | PRN
  Filled 2019-09-15: qty 10

## 2019-09-15 MED ORDER — MIDAZOLAM HCL 2 MG/2ML IJ SOLN: 2.0000 mg | INTRAMUSCULAR | Status: DC | PRN

## 2019-09-15 MED ORDER — FENTANYL CITRATE (PF) 100 MCG/2ML IJ SOLN
50.0000 ug | INTRAMUSCULAR | Status: DC | PRN
  Administered 2019-09-16 – 2019-09-17 (×3): 100 ug via INTRAVENOUS
  Filled 2019-09-15 (×3): qty 2

## 2019-09-15 MED ORDER — SODIUM CHLORIDE 0.9 % IV SOLN
500.0000 mg | INTRAVENOUS | Status: DC
  Administered 2019-09-15 – 2019-09-17 (×3): 500 mg via INTRAVENOUS
  Filled 2019-09-15 (×5): qty 500

## 2019-09-15 MED ORDER — ACETAMINOPHEN 325 MG PO TABS: 650.0000 mg | ORAL_TABLET | ORAL | Status: DC | PRN

## 2019-09-15 MED ORDER — ONDANSETRON HCL 4 MG/2ML IJ SOLN: 4.0000 mg | Freq: Four times a day (QID) | INTRAMUSCULAR | Status: DC | PRN

## 2019-09-15 MED ORDER — ORAL CARE MOUTH RINSE
15.0000 mL | OROMUCOSAL | Status: DC
  Administered 2019-09-15 – 2019-09-18 (×25): 15 mL via OROMUCOSAL

## 2019-09-15 MED ORDER — SODIUM CHLORIDE 0.9 % IV SOLN
1.0000 g | INTRAVENOUS | Status: AC
  Administered 2019-09-15 – 2019-09-21 (×7): 1 g via INTRAVENOUS
  Filled 2019-09-15: qty 1
  Filled 2019-09-15 (×2): qty 10
  Filled 2019-09-15: qty 1
  Filled 2019-09-15: qty 10
  Filled 2019-09-15: qty 1
  Filled 2019-09-15: qty 10
  Filled 2019-09-15: qty 1

## 2019-09-15 MED ORDER — SODIUM POLYSTYRENE SULFONATE 15 GM/60ML PO SUSP
30.0000 g | Freq: Four times a day (QID) | ORAL | Status: DC
  Filled 2019-09-15 (×2): qty 120

## 2019-09-15 MED ORDER — BISACODYL 10 MG RE SUPP
10.0000 mg | Freq: Every day | RECTAL | Status: DC | PRN
  Administered 2019-10-02: 16:00:00 10 mg via RECTAL
  Filled 2019-09-15 (×2): qty 1

## 2019-09-15 MED ORDER — SODIUM CHLORIDE 0.9 % IV SOLN: INTRAVENOUS | Status: DC

## 2019-09-15 MED ORDER — SODIUM CHLORIDE 0.9% IV SOLUTION: Freq: Once | INTRAVENOUS | Status: DC

## 2019-09-15 MED ORDER — SODIUM BICARBONATE 8.4 % IV SOLN
INTRAVENOUS | Status: DC
  Filled 2019-09-15 (×4): qty 150

## 2019-09-15 MED ORDER — FENTANYL CITRATE (PF) 100 MCG/2ML IJ SOLN: 50.0000 ug | INTRAMUSCULAR | Status: DC | PRN

## 2019-09-15 NOTE — Progress Notes (Signed)
Received call from radiologist to pull OG tube back 6 cm after abd xray had ben read, measured length after pulling out at 48 cm from lip

## 2019-09-15 NOTE — H&P (Addendum)
NAME:  Ryan Villa, MRN:  [id number], DOB:  Jul 31, 1956, LOS: 0 ADMISSION DATE:  09/15/2019, CONSULTATION DATE:  09/15/2019 REFERRING MD:  [facility] CHIEF COMPLAINT: Respiratory failure, rhabdomyolysis, acute kidney injury  Brief History   63 year old male transferred from [facility] for acute respiratory failure, rhabdomyolysis, acute kidney injury due to services unavailable at that facility.  PCCM accepted in transfer.  History of present illness   This is a 63 year old male past medical history CKD stage IV, diabetes mellitus type 2, hyperlipidemia, hypertension, left carotid stenosis, intracerebral hemorrhage, status post right BKA presented to [facility] emergency department after apparently falling at his home 3 days ago, apparently lying on the floor since that time.  EMS reported that the patient's sister was bringing him food while he was lying on the floor during that 3-day period. He is status post recent right BKA and uses a prosthetic and is supposed to use a walker but is noncompliant with his per family.  Reportedly not using his walker when he fell and landed on his right side.  Unclear if there was loss of consciousness.  At that time the patient did not want his sister to call 911.  On 12/23 she called 911 because she noticed that he was having a hard time breathing and bruising on his right shoulder.  On arrival to [facility] ED the patient was able to answer questions with difficulty in understanding his speech.  He was hypoxic on room air and placed on nonrebreather, noted to appear severely ill appearing and tachypneic.  He was also noted to be covered in dried stool/urine.  He complained of right shoulder pain.  Work-up and findings significant for hypothermia with a temperature of 93.9 F, significant ecchymosis over the right shoulder area, hypotensive, given IV fluids.  He progressed to have worsening respiratory distress, hypotension, became unresponsive and subsequently intubated,  central line placed.  He had significant metabolic derangements including sodium 149, potassium 6.8, BUN 172, creatinine 10.5.  CK 2411. Procalcitonin 0.61.  UDS negative.  pH less than 7 per ABG report.  He responded fairly well to IV fluids and briefly required pressor support.  Repeat labs showed minimal improvement in metabolic derangements.  He had some urine output, milky white in color.  Noted right humeral neck fracture on x-ray per ER paper chart, right upper lobe consolidation per chest x-ray, head CT no acute intracranial process, Diffuse parasinus inflammation.  Imaging not transferred with patient.  PCCM accepted in transfer for ICU. Past Medical History  Diabetes mellitus type 2 CKD stage IV Hyperlipidemia Hypertension Left carotid gnosis Intracerebral hemorrhage Status post right [facility]   12/23 admitted   Consults:  Nephrology Orthopedic  Procedures:  NA  Significant Diagnostic Tests:  NA  Micro Data:  SARS Coronavirus 2 negative per [facility] paper chart  Antimicrobials:  Ceftriaxone 1 dose 12/23 OSH Azithromycin 1 dose 12/23 OSH  Interim history/subjective:  Received into [facility] via [facility].  Patient is lying on stretcher, unresponsive, not on sedation or pressors.  [facility] reports uneventful transport.   Objective   Height 5\' 8"  (1.727 m).    Vent Mode: PRVC FiO2 (%):  [100 %] 100 % Set Rate:  [16 bmp] 16 bmp Vt Set:  [540 mL] 540 mL PEEP:  [5 cmH20] 5 cmH20  No intake or output data in the 24 hours ending 09/15/19 1646 There were no vitals filed for this visit.  Examination: General: Acutely and chronically ill appearing, unkempt, intubated, unresponsive  male HENT: Normocephalic, PERRL-sluggish.  Dry/tacky mucus membranes Neck: No JVD. Trachea midline. CV: RRR. S1S2. No MRG. +2 radial pulses.  Right BKA.  Left DP/PT +1 Lungs: BBS diminished at bases, otherwise clear FNL, symmetrical.  Vent supported ABD: +BS x4. SNT/ND.  No masses, guarding or rigidity GU: Foley on admission draining milky yellow urine EXT: MAE well. No edema Skin: Pale, cool, dry.  Right shoulder and arm with ecchymotic areas.  Dry, scaly skin Neuro: Unresponsive.  Does not withdrawal to noxious stimuli.  Does not blink to threat.    Resolved Hospital Problem list   NA  Assessment & Plan:  This is a 63 year old male status post fall who was apparently laid on the floor for approximately 3 days who presents from [facility] ED with acute hypoxic respiratory failure, metabolic encephalopathy, acute on chronic kidney failure, acidosis rhabdomyolysis, hyperkalemia, probable community-acquired pneumonia and/or aspiration pneumonia and right humeral neck fracture.  Plan: Admit ICU Continue ventilator support to prevent eminent deterioration and further organ dysfunction from hypoxemia and hypercarbia.   Patient is at risk for sudden hypoxia, barotrauma and hemodynamic compromise.   Maintain SpO2 greater than or equal to 90%. Head of bed elevated 30 degrees. Plateau pressures less than 30 cm H20.  Stat chest x-ray, ABG.   SAT/SBT as tolerated. Bronchial hygiene. RT/bronchodilator protocol. Azithromycin and ceftriaxone Check CMP, CK, CBC, procal, BNP, UA/urine culture, EKG Bicarb drip Rest of IV fluid resuscitation Consult nephrology Consult orthopedic prn analgesia for CPOT, sedation as needed-currently not requiring sedation   Best practice:  Diet: N.p.o. Pain/Anxiety/Delirium protocol (if indicated): As needed VAP protocol (if indicated): Yes DVT prophylaxis: Heparin GI prophylaxis: PPI Glucose control: Sliding scale insulin every 4 hours Mobility: Bedrest Code Status: Full Family Communication: Will update Disposition: Admit ICU  Labs   CBC: No results for input(s): WBC, NEUTROABS, HGB, HCT, MCV, PLT in the last 168 hours.  Basic Metabolic Panel: No results for input(s): NA, K, CL, CO2, GLUCOSE, BUN, CREATININE, CALCIUM,  MG, PHOS in the last 168 hours. GFR: CrCl cannot be calculated (No successful lab value found.). No results for input(s): PROCALCITON, WBC, LATICACIDVEN in the last 168 hours.  Liver Function Tests: No results for input(s): AST, ALT, ALKPHOS, BILITOT, PROT, ALBUMIN in the last 168 hours. No results for input(s): LIPASE, AMYLASE in the last 168 hours. No results for input(s): AMMONIA in the last 168 hours.  ABG No results found for: PHART, PCO2ART, PO2ART, HCO3, TCO2, ACIDBASEDEF, O2SAT   Coagulation Profile: No results for input(s): INR, PROTIME in the last 168 hours.  Cardiac Enzymes: No results for input(s): CKTOTAL, CKMB, CKMBINDEX, TROPONINI in the last 168 hours.  HbA1C: No results found for: HGBA1C  CBG: No results for input(s): GLUCAP in the last 168 hours.  Review of Systems:   Unable to obtain full review of systems from patient as he is unresponsive and intubated.  Family not immediately available.  Past Medical History  Diabetes mellitus type 2 CKD stage IV Hyperlipidemia Hypertension Left carotid gnosis Intracerebral hemorrhage Status post right BKA  Surgical History   Right BKA  Social History    Unknown  Family History   Unknown  Allergies No Known Allergies   Home Medications  Prior to Admission medications   Medication Sig Start Date End Date Taking? Authorizing Provider  amLODipine (NORVASC) 10 MG tablet Take 10 mg by mouth. 07/28/13   [provider]  atorvastatin (LIPITOR) 80 MG tablet Take 80 mg by mouth.    [provider]  carvedilol (COREG) 6.25 MG tablet Take 6.25 mg by mouth. 07/28/13   [provider]  fluticasone (FLONASE) 50 MCG/ACT nasal spray Place into the nose.    [provider]  glucose blood (ONE TOUCH ULTRA TEST) test strip  10/15/13   [provider]  insulin aspart (NOVOLOG FLEXPEN) 100 UNIT/ML FlexPen Sliding scale 10/21/13   [provider]  insulin glargine (LANTUS) 100  unit/mL SOPN Inject into the skin. 07/28/13   [provider]  insulin lispro (HUMALOG KWIKPEN) 100 UNIT/ML KiwkPen  09/13/13   [provider]  levofloxacin (LEVAQUIN) 500 MG tablet TAKE 1 TABLET EVERY 48 HOURS. 08/12/16   [provider]  MONUROL 3 g PACK  09/24/16   [provider]  nystatin ointment (MYCOSTATIN) APPLY TWO TIMES DAILY AS NEEDED TO FOOT 07/30/16   [provider]  Jonetta Speak LANCETS [id number] [place]  08/11/13   [provider]  Probiotic Product (FLORA-Q PO) Take by mouth.    [provider]  sodium bicarbonate 650 MG tablet Take 650 mg by mouth 3 (three) times daily. 09/13/16   [provider]        The patient is critically ill with aspiratory failure, acute kidney injury, metabolic encephalopathy and rhabdomyolysis. He requires ICU for high complexity decision making, titration of high alert medications, ventilator management, titration of oxygen and interpretation of advanced monitoring.    I personally spent 40 minutes providing critical care services including personally reviewing test results, discussing care with nursing staff/other physicians and completing orders pertaining to this patient.  Time was exclusive to the patient and does not include time spent teaching or in procedures.  Voice recognition software was used in the production of this record.  Errors in interpretation may have been inadvertently missed during review.  Francine Graven, MSN, AGACNP  [facility]  Attending Note:  63 year old male that appears chronically ill who presents to PCCM from [place] ED after being found down unresponsive for 3 days.  Patient was not protecting his airway and was intubated in the ED subsequently dropped his pressure and a central line was placed and patient was started on levophed.  CXR in [place] showed a pneumonia and U/A revealed a UTI.  Patient is unresponsive and no further  history of available.  Labs from [place] revealed renal failure, hyperkalemia and severe mixed acidosis.  Will admit to the ICU.  Aggressive hydration with bicarb.  Rocephin/zithromax since no recent hospitalization or abx use.  Labs reordered now.  ABG now.  Adjust vent for ABG.  F/u on cultures.  Repeat CXR and arm x-ray since on exam the left shoulder is severe bruised.  Renal and ortho consults called.  PCCM will assume care.  The patient is critically ill with multiple organ systems failure and requires high complexity decision making for assessment and support, frequent evaluation and titration of therapies, application of advanced monitoring technologies and extensive interpretation of multiple databases.   Critical Care Time devoted to patient care services described in this note is  45  Minutes. This time reflects time of care of this signee Dr Jennet Maduro. This critical care time does not reflect procedure time, or teaching time or supervisory time of PA/NP/Med student/Med Resident etc but could involve care discussion time.  Rush Farmer, M.D. [facility]/Critical Care Medicine

## 2019-09-15 NOTE — Progress Notes (Signed)
eLink Physician-Brief Progress Note Patient Name: Ryan Villa DOB: 12/29/1955 MRN: [id number]   Date of Service  09/15/2019  HPI/Events of Note  K+ 5.8  eICU Interventions  Kayexalate 30 gm via NG tube Q 6 hours x 2 doses ordered.        Kerry Kass Tania Perrott 09/15/2019, 11:15 PM

## 2019-09-15 NOTE — Progress Notes (Signed)
Patient transported to and from CT without complications.  

## 2019-09-15 NOTE — Consult Note (Signed)
ORTHOPAEDIC CONSULTATION  REQUESTING PHYSICIAN: Rush Farmer, MD  Chief Complaint: Right proximal humerus fracture  HPI: Tykwon Chrisler is a 63 y.o. male who presents to the [facility] for being unresponsive for 3 days.  His daughter has been feeding him on the floor.  He has been intubated.  Found to have pneumonia, UTI and right proximal humerus fracture.  Ortho consulted.  No past medical history on file.  Social History   Socioeconomic History  . Marital status: Divorced    Spouse name: Not on file  . Number of children: Not on file  . Years of education: Not on file  . Highest education level: Not on file  Occupational History  . Not on file  Tobacco Use  . Smoking status: Unknown If Ever Smoked  Substance and Sexual Activity  . Alcohol use: Not on file  . Drug use: Not on file  . Sexual activity: Not on file  Other Topics Concern  . Not on file  Social History Narrative  . Not on file   Social Determinants of Health   Financial Resource Strain:   . Difficulty of Paying Living Expenses: Not on file  Food Insecurity:   . Worried About Charity fundraiser in the Last Year: Not on file  . Ran Out of Food in the Last Year: Not on file  Transportation Needs:   . Lack of Transportation (Medical): Not on file  . Lack of Transportation (Non-Medical): Not on file  Physical Activity:   . Days of Exercise per Week: Not on file  . Minutes of Exercise per Session: Not on file  Stress:   . Feeling of Stress : Not on file  Social Connections:   . Frequency of Communication with Friends and Family: Not on file  . Frequency of Social Gatherings with Friends and Family: Not on file  . Attends Religious Services: Not on file  . Active Member of Clubs or Organizations: Not on file  . Attends Archivist Meetings: Not on file  . Marital Status: Not on file   No family history on file. - negative except otherwise stated in the family history section No Known  Allergies Prior to Admission medications   Medication Sig Start Date End Date Taking? Authorizing Provider  amLODipine (NORVASC) 10 MG tablet Take 10 mg by mouth. 07/28/13   [provider]  atorvastatin (LIPITOR) 80 MG tablet Take 80 mg by mouth.    [provider]  carvedilol (COREG) 6.25 MG tablet Take 6.25 mg by mouth. 07/28/13   [provider]  fluticasone (FLONASE) 50 MCG/ACT nasal spray Place into the nose.    [provider]  glucose blood (ONE TOUCH ULTRA TEST) test strip  10/15/13   [provider]  insulin aspart (NOVOLOG FLEXPEN) 100 UNIT/ML FlexPen Sliding scale 10/21/13   [provider]  insulin glargine (LANTUS) 100 unit/mL SOPN Inject into the skin. 07/28/13   [provider]  insulin lispro (HUMALOG KWIKPEN) 100 UNIT/ML KiwkPen  09/13/13   [provider]  levofloxacin (LEVAQUIN) 500 MG tablet TAKE 1 TABLET EVERY 48 HOURS. 08/12/16   [provider]  MONUROL 3 g PACK  09/24/16   [provider]  nystatin ointment (MYCOSTATIN) APPLY TWO TIMES DAILY AS NEEDED TO FOOT 07/30/16   [provider]  Jonetta Speak LANCETS [id number] [place]  08/11/13   [provider]  Probiotic Product (FLORA-Q PO) Take by mouth.    [provider]  sodium bicarbonate 650 MG tablet Take 650 mg by mouth 3 (three) times daily. 09/13/16   [provider]   DG Shoulder 1V Right  Result Date: 09/15/2019 CLINICAL DATA:  Fall EXAM: RIGHT SHOULDER - 1 VIEW COMPARISON:  None. FINDINGS: Comminuted proximal right humerus fracture through the surgical neck with approximately 5 cm anterior displacement of the distal fracture fragment, with over riding of the fracture fragments and with probable posterior dislocation of the right humeral head fracture fragment at glenohumeral joint. No suspicious focal osseous lesions. Patchy parahilar upper right lung opacity noted. IMPRESSION: Prominently displaced  comminuted proximal right humerus fracture with probable posterior dislocation of the right humeral head fracture fragment at the glenohumeral joint, poorly assessed on this single frontal view. Patchy parahilar upper right lung opacity noted, please see concurrent chest radiograph report. Electronically Signed   By: Ilona Sorrel M.D.   On: 09/15/2019 18:24   DG Wrist 2 Views Right  Result Date: 09/15/2019 CLINICAL DATA:  Fall EXAM: RIGHT WRIST - 2 VIEW COMPARISON:  None. FINDINGS: The lateral view is obliquely positioned, limiting assessment. No evidence of fracture or dislocation. No suspicious focal osseous lesions. Mild scaphoid-trapezium osteoarthritis. Vascular calcifications in the soft tissues. No radiopaque foreign body. IMPRESSION: No evidence of right wrist fracture or dislocation, limitations as detailed. Electronically Signed   By: Ilona Sorrel M.D.   On: 09/15/2019 18:25   DG Chest Port 1 View  Result Date: 09/15/2019 CLINICAL DATA:  Intubated EXAM: PORTABLE CHEST 1 VIEW COMPARISON:  Chest radiograph from earlier today. FINDINGS: Endotracheal tube tip is 4.0 cm above the carina. Enteric tube enters stomach with the tip not definitely seen on this image. Right internal jugular central venous catheter terminates in the lower third of the SVC. Stable cardiomediastinal silhouette with normal heart size. No pneumothorax. No pleural effusion. Patchy upper right parahilar opacity, similar. Displaced proximal right humeral head fracture of uncertain chronicity again noted. IMPRESSION: 1. Well-positioned support structures. 2. Similar patchy upper right parahilar lung opacity suggesting asymmetric pulmonary edema or pneumonia. Electronically Signed   By: Ilona Sorrel M.D.   On: 09/15/2019 18:20   DG Abd Portable 1V  Result Date: 09/15/2019 CLINICAL DATA:  Enteric tube placement EXAM: PORTABLE ABDOMEN - 1 VIEW COMPARISON:  None. FINDINGS: Enteric tube courses through the stomach and loops in  descending duodenum with the tip oriented superiorly likely in the region of the duodenal bulb with possible kink in the lower descending duodenum. No disproportionately dilated small bowel loops. No evidence of pneumatosis or pneumoperitoneum. Moderate rectal stool. IMPRESSION: Enteric tube loops in the descending duodenum with possible kink, with tip likely in the duodenal bulb region. Suggest retracting the enteric tube approximately 6-8 cm. These results will be called to the ordering clinician or representative by the Radiologist Assistant, and communication documented in the PACS or zVision Dashboard. Electronically Signed   By: Ilona Sorrel M.D.   On: 09/15/2019 18:22   DG Humerus Left  Result Date: 09/15/2019 CLINICAL DATA:  Fall EXAM: LEFT HUMERUS - 2+ VIEW COMPARISON:  None. FINDINGS: Numerous external leads obscure visualization. Peripheral intravenous catheter in the left antecubital space. No evidence of fracture. No suspicious focal osseous lesions. IMPRESSION: No evidence of left humerus fracture on these limited views. Electronically Signed   By: Ilona Sorrel M.D.   On: 09/15/2019 18:26   - pertinent xrays, CT, MRI studies were reviewed and independently interpreted  Positive ROS: All other systems have been reviewed and were otherwise negative  with the exception of those mentioned in the HPI and as above.  Physical Exam: General: unresponsive Cardiovascular: CV stable Respiratory: intubated GI: No organomegaly, abdomen is soft and non-tender Skin: ecchymosis and petechiae Neurologic: unable to test Psychiatric: unresponsive and intubated Lymphatic: No axillary or cervical lymphadenopathy  MUSCULOSKELETAL:  Moderate swelling and ecchymosis around right shoulder and proximal humerus. 2+ pitting edema of the RUE Faint radial pulse Cap refill normal Hand warm and well perfused  Assessment: Displaced right surgical neck humerus fracture  Plan: - RUE NVI currently, will  leave in shoulder sling - patient currently in no condition for surgery, will eventually need shoulder replacement if he recovers from this - my colleague, Dr. Stann Mainland will follow up with this patient and determine surgical plan  Thank you for the consult and the opportunity to see Mr. Evangeline Dakin Eduard Roux, MD [facility] 6:55 PM

## 2019-09-15 NOTE — Consult Note (Signed)
[facility] Renal Consultation Note  Requesting MD: Youcoub Indication for Consultation: AKI  HPI:  Ryan Villa is a 63 y.o. male with past medical history for DM, HTN, hyperlipidemia , s/p right BKA and reported CKD although baseline creatinine is not known and also history of ICH.  He was brought to the ER at [facility] this AM-  Reportedly fell 3 days ago and lying on the floor since.  Upon arrival to the ER at [facility] he was speaking but hypoxic- covered in dried stool and urine-  Found to have a right arm fracture-  CK of around 2500.  He shortly thereafter required intubation.  His initial labs this AM showed a K of 6.8, BUN and crt of 172 and 10.5.  Labs later in day showed K down to 6.2 and crt in the 9's.  Here BUN and CRT 170 and 9.68 and K of 5.8, CK of 1452.  He has a good amount of urine in his foley at this time-  Unresponsive on the vent.    Creatinine, Ser  Date/Time Value Ref Range Status  09/15/2019 06:22 PM 9.68 (H) 0.61 - 1.24 mg/dL Final     PMHx:  No past medical history on file.    Family Hx: No family history on file.  Social History:  has no history on file for tobacco, alcohol, and drug.  Allergies: No Known Allergies  Medications: Prior to Admission medications   Medication Sig Start Date End Date Taking? Authorizing Provider  amLODipine (NORVASC) 10 MG tablet Take 10 mg by mouth. 07/28/13   [provider]  atorvastatin (LIPITOR) 80 MG tablet Take 80 mg by mouth.    [provider]  carvedilol (COREG) 6.25 MG tablet Take 6.25 mg by mouth. 07/28/13   [provider]  fluticasone (FLONASE) 50 MCG/ACT nasal spray Place into the nose.    [provider]  glucose blood (ONE TOUCH ULTRA TEST) test strip  10/15/13   [provider]  insulin aspart (NOVOLOG FLEXPEN) 100 UNIT/ML FlexPen Sliding scale 10/21/13   [provider]  insulin glargine (LANTUS) 100 unit/mL SOPN Inject into the skin. 07/28/13    [provider]  insulin lispro (HUMALOG KWIKPEN) 100 UNIT/ML KiwkPen  09/13/13   [provider]  levofloxacin (LEVAQUIN) 500 MG tablet TAKE 1 TABLET EVERY 48 HOURS. 08/12/16   [provider]  MONUROL 3 g PACK  09/24/16   [provider]  nystatin ointment (MYCOSTATIN) APPLY TWO TIMES DAILY AS NEEDED TO FOOT 07/30/16   [provider]  Jonetta Speak LANCETS [id number] [place]  08/11/13   [provider]  Probiotic Product (FLORA-Q PO) Take by mouth.    [provider]  sodium bicarbonate 650 MG tablet Take 650 mg by mouth 3 (three) times daily. 09/13/16   [provider]    I have reviewed the patient's current medications.  Labs: No results found for this or any previous visit (from the past 48 hour(s)).   ROS:  Review of systems not obtained due to patient factors.  Physical Exam: Vitals:   09/15/19 1845 09/15/19 1934  BP: 115/62 125/69  Pulse: 73 70  Resp: 20 (!) 28  Temp: 98.2 F (36.8 C)   SpO2: 100% 100%     General: unresponsive on the vent bruised righin a slingt shoulder  HEENT: PERRLA, EOMI, mucous membranes dry   Neck: no JVD Heart: RRR Lungs: poor effort, mostly clear Abdomen: soft, non tender Extremities: no edema-  Right BKA Skin: multiple abrasions Neuro: unresponsive Foley with cloudy urine but close to 500 ml  Assessment/Plan: 63 year old WM with DM, HTN and vascualr dz- unclear level of functioning or level of renal function prior to admit 1.Renal- A on CRF vs AKI in the setting of mild rhabdo after fall and prolonged time in one position. Dont know what his baseline renal function is.  The good news is that he is making urine.  His BUN and creatinine are not good but have improved some from this AM.  No absolute indications for RRT at present.   Have spoken with CCM to hydrate with bicarb based fluids and follow closely.  Urine possibly looking infected, cont foley drainage and started on  rocephin per CCM 2. Hypertension/volume  - normally hypertensive on BP meds.  Holding and hydrating for now  3. Hyperkalmia-  Has been decreasing with medical management.  Cont bicarb based fluids  4. Anemia  - severe it seems.  Likely to get blood transfusion -  Will check iron stores later and likely treat with ESA  [name] 09/15/2019, 4:58 PM

## 2019-09-15 NOTE — Progress Notes (Signed)
eLink Physician-Brief Progress Note Patient Name: Ryan Villa DOB: 12/04/1955 MRN: [id number]   Date of Service  09/15/2019  HPI/Events of Note  Pt is s/p fall with fractured humerus and right upper extremity swelling, he now has a drop in his hemoglobin.  eICU Interventions  CT right upper ext r/o hematoma, transfuse 1 unit PRBC        Okoronkwo U Ogan 09/15/2019, 8:08 PM

## 2019-09-16 ENCOUNTER — Inpatient Hospital Stay (HOSPITAL_COMMUNITY): Payer: Medicare Other

## 2019-09-16 DIAGNOSIS — L8990 Pressure ulcer of unspecified site, unspecified stage: Secondary | ICD-10-CM

## 2019-09-16 LAB — HEPATIC FUNCTION PANEL
ALT: 21 U/L (ref 0–44)
AST: 30 U/L (ref 15–41)
Albumin: 2 g/dL — ABNORMAL LOW (ref 3.5–5.0)
Alkaline Phosphatase: 46 U/L (ref 38–126)
Bilirubin, Direct: 0.1 mg/dL (ref 0.0–0.2)
Indirect Bilirubin: 0.8 mg/dL (ref 0.3–0.9)
Total Bilirubin: 0.9 mg/dL (ref 0.3–1.2)
Total Protein: 4.3 g/dL — ABNORMAL LOW (ref 6.5–8.1)

## 2019-09-16 LAB — POCT I-STAT 7, (LYTES, BLD GAS, ICA,H+H)
Acid-base deficit: 9 mmol/L — ABNORMAL HIGH (ref 0.0–2.0)
Bicarbonate: 15 mmol/L — ABNORMAL LOW (ref 20.0–28.0)
Calcium, Ion: 1.03 mmol/L — ABNORMAL LOW (ref 1.15–1.40)
HCT: 31 % — ABNORMAL LOW (ref 39.0–52.0)
Hemoglobin: 10.5 g/dL — ABNORMAL LOW (ref 13.0–17.0)
O2 Saturation: 99 %
Patient temperature: 37.7
Potassium: 4.4 mmol/L (ref 3.5–5.1)
Sodium: 151 mmol/L — ABNORMAL HIGH (ref 135–145)
TCO2: 16 mmol/L — ABNORMAL LOW (ref 22–32)
pCO2 arterial: 25.6 mmHg — ABNORMAL LOW (ref 32.0–48.0)
pH, Arterial: 7.38 (ref 7.350–7.450)
pO2, Arterial: 125 mmHg — ABNORMAL HIGH (ref 83.0–108.0)

## 2019-09-16 LAB — BASIC METABOLIC PANEL
Anion gap: 17 — ABNORMAL HIGH (ref 5–15)
Anion gap: 16 — ABNORMAL HIGH (ref 5–15)
Anion gap: 13 (ref 5–15)
BUN: 162 mg/dL — ABNORMAL HIGH (ref 8–23)
BUN: 149 mg/dL — ABNORMAL HIGH (ref 8–23)
BUN: 144 mg/dL — ABNORMAL HIGH (ref 8–23)
CO2: 15 mmol/L — ABNORMAL LOW (ref 22–32)
CO2: 21 mmol/L — ABNORMAL LOW (ref 22–32)
CO2: 25 mmol/L (ref 22–32)
Calcium: 7 mg/dL — ABNORMAL LOW (ref 8.9–10.3)
Calcium: 7.1 mg/dL — ABNORMAL LOW (ref 8.9–10.3)
Calcium: 7 mg/dL — ABNORMAL LOW (ref 8.9–10.3)
Chloride: 121 mmol/L — ABNORMAL HIGH (ref 98–111)
Chloride: 116 mmol/L — ABNORMAL HIGH (ref 98–111)
Chloride: 116 mmol/L — ABNORMAL HIGH (ref 98–111)
Creatinine, Ser: 8.66 mg/dL — ABNORMAL HIGH (ref 0.61–1.24)
Creatinine, Ser: 8.12 mg/dL — ABNORMAL HIGH (ref 0.61–1.24)
Creatinine, Ser: 7.66 mg/dL — ABNORMAL HIGH (ref 0.61–1.24)
GFR calc Af Amer: 7 mL/min — ABNORMAL LOW (ref >60)
GFR calc Af Amer: 7 mL/min — ABNORMAL LOW (ref >60)
GFR calc Af Amer: 8 mL/min — ABNORMAL LOW (ref >60)
GFR calc non Af Amer: 6 mL/min — ABNORMAL LOW (ref >60)
GFR calc non Af Amer: 6 mL/min — ABNORMAL LOW (ref >60)
GFR calc non Af Amer: 7 mL/min — ABNORMAL LOW (ref >60)
Glucose, Bld: 213 mg/dL — ABNORMAL HIGH (ref 70–99)
Glucose, Bld: 309 mg/dL — ABNORMAL HIGH (ref 70–99)
Glucose, Bld: 296 mg/dL — ABNORMAL HIGH (ref 70–99)
Potassium: 4.6 mmol/L (ref 3.5–5.1)
Potassium: 3.8 mmol/L (ref 3.5–5.1)
Potassium: 3.5 mmol/L (ref 3.5–5.1)
Sodium: 153 mmol/L — ABNORMAL HIGH (ref 135–145)
Sodium: 153 mmol/L — ABNORMAL HIGH (ref 135–145)
Sodium: 154 mmol/L — ABNORMAL HIGH (ref 135–145)

## 2019-09-16 LAB — TYPE AND SCREEN
ABO/RH(D): O POS
Antibody Screen: NEGATIVE
Unit division: 0

## 2019-09-16 LAB — BPAM RBC
Blood Product Expiration Date: 202101222359
ISSUE DATE / TIME: 202012232319
Unit Type and Rh: 5100

## 2019-09-16 LAB — CBC
HCT: 22 % — ABNORMAL LOW (ref 39.0–52.0)
Hemoglobin: 7.2 g/dL — ABNORMAL LOW (ref 13.0–17.0)
MCH: 30.3 pg (ref 26.0–34.0)
MCHC: 32.7 g/dL (ref 30.0–36.0)
MCV: 92.4 fL (ref 80.0–100.0)
Platelets: 175 10*3/uL (ref 150–400)
RBC: 2.38 MIL/uL — ABNORMAL LOW (ref 4.22–5.81)
RDW: 14 % (ref 11.5–15.5)
WBC: 5.8 10*3/uL (ref 4.0–10.5)
nRBC: 0.7 % — ABNORMAL HIGH (ref 0.0–0.2)

## 2019-09-16 LAB — PHOSPHORUS: Phosphorus: 7.2 mg/dL — ABNORMAL HIGH (ref 2.5–4.6)

## 2019-09-16 LAB — GLUCOSE, CAPILLARY
Glucose-Capillary: 200 mg/dL — ABNORMAL HIGH (ref 70–99)
Glucose-Capillary: 211 mg/dL — ABNORMAL HIGH (ref 70–99)
Glucose-Capillary: 237 mg/dL — ABNORMAL HIGH (ref 70–99)
Glucose-Capillary: 310 mg/dL — ABNORMAL HIGH (ref 70–99)
Glucose-Capillary: 255 mg/dL — ABNORMAL HIGH (ref 70–99)
Glucose-Capillary: 244 mg/dL — ABNORMAL HIGH (ref 70–99)

## 2019-09-16 LAB — CK: Total CK: 945 U/L — ABNORMAL HIGH (ref 49–397)

## 2019-09-16 LAB — MAGNESIUM: Magnesium: 2.1 mg/dL (ref 1.7–2.4)

## 2019-09-16 MED ORDER — HYDRALAZINE HCL 20 MG/ML IJ SOLN
10.0000 mg | Freq: Once | INTRAMUSCULAR | Status: AC
  Administered 2019-09-16: 10 mg via INTRAVENOUS
  Filled 2019-09-16: qty 1

## 2019-09-16 MED ORDER — SODIUM ZIRCONIUM CYCLOSILICATE 5 G PO PACK
5.0000 g | PACK | Freq: Once | ORAL | Status: AC
  Administered 2019-09-16: 5 g via ORAL
  Filled 2019-09-16: qty 1

## 2019-09-16 MED ORDER — FREE WATER
300.0000 mL | Freq: Four times a day (QID) | Status: DC
  Administered 2019-09-16 – 2019-09-24 (×21): 300 mL

## 2019-09-16 MED ORDER — SODIUM BICARBONATE-DEXTROSE 150-5 MEQ/L-% IV SOLN
150.0000 meq | INTRAVENOUS | Status: DC
  Filled 2019-09-16: qty 1000

## 2019-09-16 MED ORDER — SODIUM BICARBONATE-DEXTROSE 150-5 MEQ/L-% IV SOLN
150.0000 meq | INTRAVENOUS | Status: DC
  Administered 2019-09-16 – 2019-09-17 (×5): 150 meq via INTRAVENOUS
  Filled 2019-09-16 (×6): qty 1000

## 2019-09-16 MED ORDER — DEXMEDETOMIDINE HCL IN NACL 400 MCG/100ML IV SOLN
0.4000 ug/kg/h | INTRAVENOUS | Status: DC
  Administered 2019-09-16 – 2019-09-17 (×2): 0.4 ug/kg/h via INTRAVENOUS
  Filled 2019-09-16 (×2): qty 100

## 2019-09-16 MED ORDER — DARBEPOETIN ALFA 100 MCG/0.5ML IJ SOSY
100.0000 ug | PREFILLED_SYRINGE | INTRAMUSCULAR | Status: DC
  Administered 2019-09-16 – 2019-09-23 (×2): 100 ug via SUBCUTANEOUS
  Filled 2019-09-16 (×3): qty 0.5

## 2019-09-16 NOTE — Progress Notes (Addendum)
Initial Nutrition Assessment  DOCUMENTATION CODES:   Severe malnutrition in context of acute illness/injury  INTERVENTION:   If unable to extubate patient within next 24 hours, recommend begin TF via OGT:   Vital AF 1.2 at 25 ml/h, increase by 10 ml every 4 hours to goal rate of 65 ml/h (1560 ml per day)   Provides 1872 kcal, 117 gm protein, 1265 ml free water daily   When feedings begin, monitor magnesium, potassium, and phosphorus levels, MD to replete as needed, as pt is at risk for refeeding syndrome given severe PCM with recent minimal intake.   NUTRITION DIAGNOSIS:   Severe Malnutrition related to acute illness(S/P fall at home with rhabdomyolysis after lying on the floor for 3 days) as evidenced by moderate fat depletion, moderate muscle depletion, severe muscle depletion, severe fat depletion.  GOAL:   Patient will meet greater than or equal to 90% of their needs  MONITOR:   Vent status, Labs, Skin, TF tolerance, I & O's  REASON FOR ASSESSMENT:   Ventilator    ASSESSMENT:   63 yo male admitted with respiratory failure, rhabdomyolysis, and AKI after fall at home 3 days PTA. He had been lying on the floor x 3 days since his fall. Found to have PNA, UTI, and right proximal humerus fracture. PMH includes CKD-IV, HLD, DM-2, HTN, L carotid stenosis, ICH, R BKA.   Orthopedics following. Patient is not appropriate for surgery at this time.   Per H&P, patient's sister was bringing him food while he was lying in the floor x 3 days. Suspect minimal intake.   Nephrology following for AKI vs acute on chronic renal failure. Not planning for RRT at this time. UOP 750 ml overnight.   Patient is currently intubated on ventilator support. OG tube in place.  MV: 11.2 L/min Temp (24hrs), Avg:99.4 F (37.4 C), Min:97.6 F (36.4 C), Max:100.2 F (37.9 C)   Labs reviewed. Sodium 151 (H), BUN 162 (H), creatinine 8.66 (H), phosphorus 7.2 (H) CBG's: 200-211  Medications  reviewed and include solu-cortef, novolog, sodium bicarb in IVF at 175 ml/h. Receiving free water flushes 300 ml every 6 hours.   Per RN documentation in flow sheets, patient has +3 deep pitting edema to RUE.  No recent weights available for review.  Patient meets criteria for severe malnutrition.  NUTRITION - FOCUSED PHYSICAL EXAM:    Most Recent Value  Orbital Region  Mild depletion  Upper Arm Region  Severe depletion  Thoracic and Lumbar Region  Moderate depletion  Buccal Region  Unable to assess  Temple Region  Moderate depletion  Clavicle Bone Region  Moderate depletion  Clavicle and Acromion Bone Region  Moderate depletion  Scapular Bone Region  Moderate depletion  Dorsal Hand  Unable to assess  Patellar Region  Moderate depletion  Anterior Thigh Region  Severe depletion  Posterior Calf Region  Mild depletion [R. BKA]  Edema (RD Assessment)  -- [RUE edeme only]  Hair  Reviewed  Eyes  Unable to assess  Mouth  Other (Comment) [poor dentition: many broken and missing teeth]  Skin  -- [bruising, skin trears, scab/wound on BKA stub]  Nails  Other (Comment) [extremely long and curled]       Diet Order:   Diet Order            Diet NPO time specified  Diet effective now              EDUCATION NEEDS:   Not appropriate for education at  this time  Skin:  Skin Assessment: Skin Integrity Issues: Skin Integrity Issues:: Stage II Stage II: R lower arm  Last BM:  No BM documented  Height:   Ht Readings from Last 1 Encounters:  09/15/19 5\' 8"  (1.727 m)    Weight:   Wt Readings from Last 1 Encounters:  09/16/19 68.5 kg    Ideal Body Weight:  65.5 kg(adjusted for BKA)  BMI:  24.6  Estimated Nutritional Needs:   Kcal:  1865  Protein:  100-120 gm  Fluid:  >/= 1.9 L    Molli Barrows, RD, LDN, [place] Pager [phone] After Hours Pager [phone]

## 2019-09-16 NOTE — Progress Notes (Signed)
I have discussed this case with Dr. Erlinda Hong, and I am happy to provide definitive care for the right proximal humerus fracture.  At this time patient in extremis and not appropriate for any treatment.  Will follow from [place] and help when patient stable and extubated.    Call with questions  Juanita Laster [place] [phone]

## 2019-09-16 NOTE — Plan of Care (Signed)
  Problem: Education: Goal: Knowledge of General Education information will improve Description: Including pain rating scale, medication(s)/side effects and non-pharmacologic comfort measures Outcome: Progressing   Problem: Clinical Measurements: Goal: Ability to maintain clinical measurements within normal limits will improve Outcome: Progressing Goal: Respiratory complications will improve Outcome: Progressing   Problem: Coping: Goal: Level of anxiety will decrease Outcome: Progressing   Problem: Elimination: Goal: Will not experience complications related to bowel motility Outcome: Progressing Goal: Will not experience complications related to urinary retention Outcome: Progressing   Problem: Safety: Goal: Ability to remain free from injury will improve Outcome: Progressing   Problem: Skin Integrity: Goal: Risk for impaired skin integrity will decrease Outcome: Progressing

## 2019-09-16 NOTE — Progress Notes (Signed)
eLink Physician-Brief Progress Note Patient Name: Ryan Villa DOB: [birth year] MRN: [id number]   Date of Service  09/16/2019  HPI/Events of Note  Lokelma substituted for  Kayexalate   eICU Interventions  See above.        [name] 09/16/2019, 12:38 AM

## 2019-09-16 NOTE — Progress Notes (Signed)
Patient transported to CT & back on the vent with no problems.  Icyss Skog RRT 

## 2019-09-16 NOTE — Progress Notes (Signed)
Subjective:  Hemodynamically stable in ICU overnight - 750 of UOP recorded, given blood  - still not that responsive    Objective Vital signs in last 24 hours: Vitals:   09/16/19 0500 09/16/19 0515 09/16/19 0530 09/16/19 0545  BP: 109/62 103/62 103/62 109/62  Pulse: (!) 59 (!) 59 (!) 59 (!) 58  Resp: '18 18 17 18  ' Temp: 99.9 F (37.7 C) 99.9 F (37.7 C) 99.9 F (37.7 C) 99.9 F (37.7 C)  TempSrc:      SpO2: 100% 100% 100% 100%  Height:       Weight change:   Intake/Output Summary (Last 24 hours) at 09/16/2019 0175 Last data filed at 09/16/2019 0500 Gross per 24 hour  Intake 2209.17 ml  Output 750 ml  Net 1459.17 ml    Assessment/Plan: 63 year old WM with DM, HTN and vascualr dz- unclear level of functioning or level of renal function prior to admit 1.Renal- A on CRF vs AKI in the setting of mild rhabdo after fall and prolonged time in one position. Dont know what his baseline renal function is.  The good news is that he is making urine.  His BUN and creatinine are not good but have improved during the course of hosp.  No absolute indications for RRT at present.   Continue to hydrate with bicarb based fluids- will inc rate and follow closely.  Urine possibly looking infected, cont foley drainage and started on rocephin.   2. Hypertension/volume  - normally hypertensive on BP meds.  Holding BP meds and hydrating for now - still does not seem wet, inc rate of IVF and will also add free water  3. Hyperkalmia-  Has been decreasing with medical management.  Normal this AM 4. Anemia  -  s/p blood transfusion -  Will check iron stores and add on  ESA 5.  Hypernatremia-  More evidence of volume depletion-  Will give free water per NGT   [facility]    Labs: Basic Metabolic Panel: Recent Labs  Lab 09/15/19 1822 09/16/19 0345 09/16/19 0354  NA 151* 153* 151*  K 5.8* 4.6 4.4  CL 122* 121*  --   CO2 12* 15*  --   GLUCOSE 211* 213*  --   BUN 170* 162*  --   CREATININE  9.68* 8.66*  --   CALCIUM 7.0* 7.0*  --   PHOS 10.4*  --   --    Liver Function Tests: No results for input(s): AST, ALT, ALKPHOS, BILITOT, PROT, ALBUMIN in the last 168 hours. No results for input(s): LIPASE, AMYLASE in the last 168 hours. No results for input(s): AMMONIA in the last 168 hours. CBC: Recent Labs  Lab 09/15/19 1822 09/16/19 0345 09/16/19 0354  WBC 4.6 5.8  --   NEUTROABS 3.9  --   --   HGB 6.8* 7.2* 10.5*  HCT 21.3* 22.0* 31.0*  MCV 94.2 92.4  --   PLT 190 175  --    Cardiac Enzymes: Recent Labs  Lab 09/15/19 1822  CKTOTAL 1,452*   CBG: Recent Labs  Lab 09/15/19 1735 09/15/19 1943 09/15/19 2320 09/16/19 0320  GLUCAP 180* 150* 179* 200*    Iron Studies: No results for input(s): IRON, TIBC, TRANSFERRIN, FERRITIN in the last 72 hours. Studies/Results: DG Shoulder 1V Right  Result Date: 09/15/2019 CLINICAL DATA:  Fall EXAM: RIGHT SHOULDER - 1 VIEW COMPARISON:  None. FINDINGS: Comminuted proximal right humerus fracture through the surgical neck with approximately 5 cm anterior displacement  of the distal fracture fragment, with over riding of the fracture fragments and with probable posterior dislocation of the right humeral head fracture fragment at glenohumeral joint. No suspicious focal osseous lesions. Patchy parahilar upper right lung opacity noted. IMPRESSION: Prominently displaced comminuted proximal right humerus fracture with probable posterior dislocation of the right humeral head fracture fragment at the glenohumeral joint, poorly assessed on this single frontal view. Patchy parahilar upper right lung opacity noted, please see concurrent chest radiograph report. Electronically Signed   By: Ilona Sorrel M.D.   On: 09/15/2019 18:24   DG Wrist 2 Views Right  Result Date: 09/15/2019 CLINICAL DATA:  Fall EXAM: RIGHT WRIST - 2 VIEW COMPARISON:  None. FINDINGS: The lateral view is obliquely positioned, limiting assessment. No evidence of fracture or  dislocation. No suspicious focal osseous lesions. Mild scaphoid-trapezium osteoarthritis. Vascular calcifications in the soft tissues. No radiopaque foreign body. IMPRESSION: No evidence of right wrist fracture or dislocation, limitations as detailed. Electronically Signed   By: Ilona Sorrel M.D.   On: 09/15/2019 18:25   DG Chest Port 1 View  Result Date: 09/15/2019 CLINICAL DATA:  Intubated EXAM: PORTABLE CHEST 1 VIEW COMPARISON:  Chest radiograph from earlier today. FINDINGS: Endotracheal tube tip is 4.0 cm above the carina. Enteric tube enters stomach with the tip not definitely seen on this image. Right internal jugular central venous catheter terminates in the lower third of the SVC. Stable cardiomediastinal silhouette with normal heart size. No pneumothorax. No pleural effusion. Patchy upper right parahilar opacity, similar. Displaced proximal right humeral head fracture of uncertain chronicity again noted. IMPRESSION: 1. Well-positioned support structures. 2. Similar patchy upper right parahilar lung opacity suggesting asymmetric pulmonary edema or pneumonia. Electronically Signed   By: Ilona Sorrel M.D.   On: 09/15/2019 18:20   DG Abd Portable 1V  Result Date: 09/15/2019 CLINICAL DATA:  Enteric tube placement EXAM: PORTABLE ABDOMEN - 1 VIEW COMPARISON:  None. FINDINGS: Enteric tube courses through the stomach and loops in descending duodenum with the tip oriented superiorly likely in the region of the duodenal bulb with possible kink in the lower descending duodenum. No disproportionately dilated small bowel loops. No evidence of pneumatosis or pneumoperitoneum. Moderate rectal stool. IMPRESSION: Enteric tube loops in the descending duodenum with possible kink, with tip likely in the duodenal bulb region. Suggest retracting the enteric tube approximately 6-8 cm. These results will be called to the ordering clinician or representative by the Radiologist Assistant, and communication documented in the  PACS or zVision Dashboard. Electronically Signed   By: Ilona Sorrel M.D.   On: 09/15/2019 18:22   DG Humerus Left  Result Date: 09/15/2019 CLINICAL DATA:  Fall EXAM: LEFT HUMERUS - 2+ VIEW COMPARISON:  None. FINDINGS: Numerous external leads obscure visualization. Peripheral intravenous catheter in the left antecubital space. No evidence of fracture. No suspicious focal osseous lesions. IMPRESSION: No evidence of left humerus fracture on these limited views. Electronically Signed   By: Ilona Sorrel M.D.   On: 09/15/2019 18:26   CT Extrem Up Entire Arm R WO/CM  Result Date: 09/15/2019 CLINICAL DATA:  63 year old male with right humeral fracture. EXAM: CT OF THE UPPER RIGHT EXTREMITY WITHOUT CONTRAST TECHNIQUE: Multidetector CT imaging of the upper right extremity was performed according to the standard protocol. COMPARISON:  Radiograph dated 09/15/2019. FINDINGS: Evaluation of this exam is limited due to motion artifact. Bones/Joint/Cartilage There is a displaced transverse fracture of the right humeral neck. There is anterior displacement of the humeral shaft in relation  to the humeral head and approximately 2 cm overlap. There is a comminuted fracture of the right humeral head with mild displacement of the greater tuberosity. There is internal rotation of the humeral head. No dislocation. Ligaments Suboptimally assessed by CT. Muscles and Tendons There is extensive edema in the soft tissues surrounding the right shoulder. Soft tissues Diffuse subcutaneous soft tissue edema of the right upper extremity. No large fluid collection or hematoma. An endotracheal tube noted above the carina. An enteric tube is partially visualized. Consolidative changes in the right lung most concerning for pneumonia although aspiration is not excluded. Probable small right pleural effusion. Advanced atherosclerotic calcification of the aorta. Diffusely thickened appearance of the bladder wall with air within the urinary bladder  which may be related to recent instrumentation. Clinical correlation is recommended. Right IJ central venous line. IMPRESSION: 1. Comminuted fracture of the humeral head with a displaced transverse fracture of the humeral neck. No dislocation. 2. Consolidative changes of the right lung most concerning for pneumonia although aspiration is not excluded. Clinical correlation is recommended. 3. Thickened appearance of the bladder wall with air within the urinary bladder which may be related to recent instrumentation. Clinical correlation is recommended. Electronically Signed   By: Anner Crete M.D.   On: 09/15/2019 22:35   Medications: Infusions: . sodium chloride Stopped (09/15/19 2148)  . azithromycin 500 mg (09/15/19 2259)  . cefTRIAXone (ROCEPHIN)  IV Stopped (09/15/19 2250)  . sodium bicarbonate 150 mEq in dextrose 5% 1000 mL      Scheduled Medications: . sodium chloride   Intravenous Once  . chlorhexidine gluconate (MEDLINE KIT)  15 mL Mouth Rinse BID  . Chlorhexidine Gluconate Cloth  6 each Topical Daily  . heparin  5,000 Units Subcutaneous Q8H  . hydrocortisone sodium succinate  50 mg Intravenous Q6H  . insulin aspart  0-9 Units Subcutaneous Q4H  . mouth rinse  15 mL Mouth Rinse 10 times per day  . pantoprazole (PROTONIX) IV  40 mg Intravenous QHS    have reviewed scheduled and prn medications.  Physical Exam: General: still pretty unresponsive on no sedation Heart: RRR Lungs: mostly clear Abdomen: soft, non tender Extremities: no peripheral edema-  Right arm bruised     09/16/2019,6:38 AM  LOS: 1 day

## 2019-09-16 NOTE — Progress Notes (Signed)
NAME:  Ryan Villa, MRN:  [id number], DOB:  1956/04/20, LOS: 1 ADMISSION DATE:  09/15/2019, CONSULTATION DATE:  09/15/2019 REFERRING MD:  [facility] CHIEF COMPLAINT: Respiratory failure, rhabdomyolysis, acute kidney injury  Brief History   63 year old male transferred from [facility] for acute respiratory failure, rhabdomyolysis, acute kidney injury due to services unavailable at that facility.  PCCM accepted in transfer.  History of present illness   This is a 63 year old male past medical history CKD stage IV, diabetes mellitus type 2, hyperlipidemia, hypertension, left carotid stenosis, intracerebral hemorrhage, status post right BKA presented to [facility] emergency department after apparently falling at his home 3 days ago, apparently lying on the floor since that time.  EMS reported that the patient's sister was bringing him food while he was lying on the floor during that 3-day period. He is status post recent right BKA and uses a prosthetic and is supposed to use a walker but is noncompliant with his per family.  Reportedly not using his walker when he fell and landed on his right side.  Unclear if there was loss of consciousness.  At that time the patient did not want his sister to call 911.  On 12/23 she called 911 because she noticed that he was having a hard time breathing and bruising on his right shoulder.  On arrival to [facility] ED the patient was able to answer questions with difficulty in understanding his speech.  He was hypoxic on room air and placed on nonrebreather, noted to appear severely ill appearing and tachypneic.  He was also noted to be covered in dried stool/urine.  He complained of right shoulder pain.  Work-up and findings significant for hypothermia with a temperature of 93.9 F, significant ecchymosis over the right shoulder area, hypotensive, given IV fluids.  He progressed to have worsening respiratory distress, hypotension, became unresponsive and subsequently intubated,  central line placed.  He had significant metabolic derangements including sodium 149, potassium 6.8, BUN 172, creatinine 10.5.  CK 2411. Procalcitonin 0.61.  UDS negative.  pH less than 7 per ABG report.  He responded fairly well to IV fluids and briefly required pressor support.  Repeat labs showed minimal improvement in metabolic derangements.  He had some urine output, milky white in color.  Noted right humeral neck fracture on x-ray per ER paper chart, right upper lobe consolidation per chest x-ray, head CT no acute intracranial process, Diffuse parasinus inflammation.  Imaging not transferred with patient.  PCCM accepted in transfer for ICU. Past Medical History  Diabetes mellitus type 2 CKD stage IV Hyperlipidemia Hypertension Left carotid gnosis Intracerebral hemorrhage Status post right [facility] Events   12/23 admitted   Consults:  Nephrology Orthopedic  Procedures:  NA  Significant Diagnostic Tests:  NA  Micro Data:  SARS Coronavirus 2 negative per [facility] paper chart  Antimicrobials:  Ceftriaxone 1 dose 12/23 OSH Azithromycin 1 dose 12/23 OSH  Interim history/subjective:  No events overnight No new complaints  Objective   Blood pressure 117/61, pulse (!) 59, temperature 99.9 F (37.7 C), resp. rate 18, height 5\' 8"  (1.727 m), SpO2 100 %.    Vent Mode: PRVC FiO2 (%):  [40 %-100 %] 40 % Set Rate:  [16 bmp] 16 bmp Vt Set:  [540 mL] 540 mL PEEP:  [5 cmH20] 5 cmH20 Plateau Pressure:  [12 cmH20-14 cmH20] 12 cmH20   Intake/Output Summary (Last 24 hours) at 09/16/2019 0806 Last data filed at 09/16/2019 0708 Gross per 24 hour  Intake 2209.17  ml  Output 1150 ml  Net 1059.17 ml   There were no vitals filed for this visit.  Examination: General: Acute on chronically ill appearing male, NAD, sedate on vent HENT: [place]/AT, pupils sluggish, EOM-spontaneous and ETT in place Neck: No JVD. Trachea midline. CV: RRR, Nl S1/S2 and -M/R/G Lungs:  Diminished diffusely ABD: Soft, NT, ND and +BS GU: Foley on admission draining milky yellow urine EXT: Right arm in sling, withdraws all other ext to pain not command Skin: Pale, cool, dry.  Right shoulder and arm with ecchymotic areas.  Dry, scaly skin Neuro: Unresponsive.  Does not withdrawal to noxious stimuli.  Does not blink to threat.  I reviewed CXR myself, ETT ok, infiltrate noted and right humeral head displaced fracture noted  Discussed with bedside [facility] Problem list   NA  Assessment & Plan:  This is a 63 year old male status post fall who was apparently laid on the floor for approximately 3 days who presents from [place] ED with acute hypoxic respiratory failure, metabolic encephalopathy, acute on chronic kidney failure, acidosis rhabdomyolysis, hyperkalemia, probable community-acquired pneumonia and/or aspiration pneumonia and right humeral neck fracture.  Plan: Hold in the ICU Adjust vent for ABG, decrease rate to 12 today Patient is at risk for sudden hypoxia, barotrauma and hemodynamic compromise.   Maintain SpO2 greater than or equal to 90%. Head of bed elevated 30 degrees. Plateau pressures less than 30 cm H20.  SAT/SBT as tolerated one metabolically more sound CXR and ABG in AM Bronchial hygiene. RT/bronchodilator protocol. Azithromycin and ceftriaxone, continue for now F/U on cultures Bicarb drip to continue at current rate No HD for now Consult nephrology, appreciate input Consult orthopedic, appreciate input PRN analgesia for CPOT, sedation as needed-currently not requiring sedation  PCCM will continue to manage  Best practice:  Diet: N.p.o. Pain/Anxiety/Delirium protocol (if indicated): As needed VAP protocol (if indicated): Yes DVT prophylaxis: Heparin GI prophylaxis: PPI Glucose control: Sliding scale insulin every 4 hours Mobility: Bedrest Code Status: Full Family Communication: Will update Disposition: Admit ICU  Labs     CBC: Recent Labs  Lab 09/15/19 1731 09/15/19 1822 09/16/19 0345 09/16/19 0354  WBC  --  4.6 5.8  --   NEUTROABS  --  3.9  --   --   HGB 6.1* 6.8* 7.2* 10.5*  HCT 18.0* 21.3* 22.0* 31.0*  MCV  --  94.2 92.4  --   PLT  --  190 175  --     Basic Metabolic Panel: Recent Labs  Lab 09/15/19 1731 09/15/19 1822 09/16/19 0345 09/16/19 0354  NA 149* 151* 153* 151*  K 5.7* 5.8* 4.6 4.4  CL  --  122* 121*  --   CO2  --  12* 15*  --   GLUCOSE  --  211* 213*  --   BUN  --  170* 162*  --   CREATININE  --  9.68* 8.66*  --   CALCIUM  --  7.0* 7.0*  --   MG  --  2.1 2.1  --   PHOS  --  10.4* 7.2*  --    GFR: CrCl cannot be calculated (Unknown ideal weight.). Recent Labs  Lab 09/15/19 1817 09/15/19 1822 09/15/19 2050 09/16/19 0345  PROCALCITON  --  2.92  --   --   WBC  --  4.6  --  5.8  LATICACIDVEN 1.2  --  1.3  --     Liver Function Tests: Recent Labs  Lab 09/16/19 0345  AST 30  ALT 21  ALKPHOS 46  BILITOT 0.9  PROT 4.3*  ALBUMIN 2.0*   No results for input(s): LIPASE, AMYLASE in the last 168 hours. No results for input(s): AMMONIA in the last 168 hours.  ABG    Component Value Date/Time   PHART 7.380 09/16/2019 0354   PCO2ART 25.6 (L) 09/16/2019 0354   PO2ART 125.0 (H) 09/16/2019 0354   HCO3 15.0 (L) 09/16/2019 0354   TCO2 16 (L) 09/16/2019 0354   ACIDBASEDEF 9.0 (H) 09/16/2019 0354   O2SAT 99.0 09/16/2019 0354     Coagulation Profile: No results for input(s): INR, PROTIME in the last 168 hours.  Cardiac Enzymes: Recent Labs  Lab 09/15/19 1822 09/16/19 0345  CKTOTAL 1,452* 945*    HbA1C: Hgb A1c MFr Bld  Date/Time Value Ref Range Status  09/15/2019 06:22 PM 5.4 4.8 - 5.6 % Final    Comment:    (NOTE) Pre diabetes:          5.7%-6.4% Diabetes:              >6.4% Glycemic control for   <7.0% adults with diabetes     CBG: Recent Labs  Lab 09/15/19 1735 09/15/19 1943 09/15/19 2320 09/16/19 0320 09/16/19 0734  GLUCAP 180* 150* 179*  200* 211*   The patient is critically ill with multiple organ systems failure and requires high complexity decision making for assessment and support, frequent evaluation and titration of therapies, application of advanced monitoring technologies and extensive interpretation of multiple databases.   Critical Care Time devoted to patient care services described in this note is  32  Minutes. This time reflects time of care of this signee Dr Jennet Maduro. This critical care time does not reflect procedure time, or teaching time or supervisory time of PA/NP/Med student/Med Resident etc but could involve care discussion time.  Rush Farmer, M.D. [facility] Pulmonary/Critical Care Medicine.

## 2019-09-17 ENCOUNTER — Inpatient Hospital Stay (HOSPITAL_COMMUNITY): Payer: Medicare Other

## 2019-09-17 DIAGNOSIS — E43 Unspecified severe protein-calorie malnutrition: Secondary | ICD-10-CM | POA: Insufficient documentation

## 2019-09-17 DIAGNOSIS — G9340 Encephalopathy, unspecified: Secondary | ICD-10-CM

## 2019-09-17 LAB — POCT I-STAT 7, (LYTES, BLD GAS, ICA,H+H)
Acid-Base Excess: 12 mmol/L — ABNORMAL HIGH (ref 0.0–2.0)
Acid-Base Excess: 7 mmol/L — ABNORMAL HIGH (ref 0.0–2.0)
Bicarbonate: 31.1 mmol/L — ABNORMAL HIGH (ref 20.0–28.0)
Bicarbonate: 35 mmol/L — ABNORMAL HIGH (ref 20.0–28.0)
Calcium, Ion: 0.95 mmol/L — ABNORMAL LOW (ref 1.15–1.40)
Calcium, Ion: 0.99 mmol/L — ABNORMAL LOW (ref 1.15–1.40)
HCT: 20 % — ABNORMAL LOW (ref 39.0–52.0)
HCT: 22 % — ABNORMAL LOW (ref 39.0–52.0)
Hemoglobin: 6.8 g/dL — CL (ref 13.0–17.0)
Hemoglobin: 7.5 g/dL — ABNORMAL LOW (ref 13.0–17.0)
O2 Saturation: 89 %
O2 Saturation: 97 %
Patient temperature: 37.3
Patient temperature: 37.9
Potassium: 3.2 mmol/L — ABNORMAL LOW (ref 3.5–5.1)
Potassium: 3.4 mmol/L — ABNORMAL LOW (ref 3.5–5.1)
Sodium: 152 mmol/L — ABNORMAL HIGH (ref 135–145)
Sodium: 153 mmol/L — ABNORMAL HIGH (ref 135–145)
TCO2: 32 mmol/L (ref 22–32)
TCO2: 36 mmol/L — ABNORMAL HIGH (ref 22–32)
pCO2 arterial: 38.2 mmHg (ref 32.0–48.0)
pCO2 arterial: 40.8 mmHg (ref 32.0–48.0)
pH, Arterial: 7.494 — ABNORMAL HIGH (ref 7.350–7.450)
pH, Arterial: 7.571 — ABNORMAL HIGH (ref 7.350–7.450)
pO2, Arterial: 55 mmHg — ABNORMAL LOW (ref 83.0–108.0)
pO2, Arterial: 75 mmHg — ABNORMAL LOW (ref 83.0–108.0)

## 2019-09-17 LAB — BASIC METABOLIC PANEL
Anion gap: 11 (ref 5–15)
BUN: 116 mg/dL — ABNORMAL HIGH (ref 8–23)
CO2: 30 mmol/L (ref 22–32)
Calcium: 6.6 mg/dL — ABNORMAL LOW (ref 8.9–10.3)
Chloride: 113 mmol/L — ABNORMAL HIGH (ref 98–111)
Creatinine, Ser: 6.44 mg/dL — ABNORMAL HIGH (ref 0.61–1.24)
GFR calc Af Amer: 10 mL/min — ABNORMAL LOW (ref >60)
GFR calc non Af Amer: 8 mL/min — ABNORMAL LOW (ref >60)
Glucose, Bld: 174 mg/dL — ABNORMAL HIGH (ref 70–99)
Potassium: 3.5 mmol/L (ref 3.5–5.1)
Sodium: 154 mmol/L — ABNORMAL HIGH (ref 135–145)

## 2019-09-17 LAB — RENAL FUNCTION PANEL
Albumin: 2 g/dL — ABNORMAL LOW (ref 3.5–5.0)
Anion gap: 15 (ref 5–15)
BUN: 127 mg/dL — ABNORMAL HIGH (ref 8–23)
CO2: 31 mmol/L (ref 22–32)
Calcium: 6.9 mg/dL — ABNORMAL LOW (ref 8.9–10.3)
Chloride: 110 mmol/L (ref 98–111)
Creatinine, Ser: 6.98 mg/dL — ABNORMAL HIGH (ref 0.61–1.24)
GFR calc Af Amer: 9 mL/min — ABNORMAL LOW (ref >60)
GFR calc non Af Amer: 8 mL/min — ABNORMAL LOW (ref >60)
Glucose, Bld: 243 mg/dL — ABNORMAL HIGH (ref 70–99)
Phosphorus: 5.1 mg/dL — ABNORMAL HIGH (ref 2.5–4.6)
Potassium: 3.4 mmol/L — ABNORMAL LOW (ref 3.5–5.1)
Sodium: 156 mmol/L — ABNORMAL HIGH (ref 135–145)

## 2019-09-17 LAB — CBC
HCT: 22.1 % — ABNORMAL LOW (ref 39.0–52.0)
Hemoglobin: 7.4 g/dL — ABNORMAL LOW (ref 13.0–17.0)
MCH: 30.8 pg (ref 26.0–34.0)
MCHC: 33.5 g/dL (ref 30.0–36.0)
MCV: 92.1 fL (ref 80.0–100.0)
Platelets: 168 10*3/uL (ref 150–400)
RBC: 2.4 MIL/uL — ABNORMAL LOW (ref 4.22–5.81)
RDW: 14.2 % (ref 11.5–15.5)
WBC: 6.4 10*3/uL (ref 4.0–10.5)
nRBC: 0.3 % — ABNORMAL HIGH (ref 0.0–0.2)

## 2019-09-17 LAB — GLUCOSE, CAPILLARY
Glucose-Capillary: 240 mg/dL — ABNORMAL HIGH (ref 70–99)
Glucose-Capillary: 163 mg/dL — ABNORMAL HIGH (ref 70–99)
Glucose-Capillary: 160 mg/dL — ABNORMAL HIGH (ref 70–99)
Glucose-Capillary: 146 mg/dL — ABNORMAL HIGH (ref 70–99)
Glucose-Capillary: 149 mg/dL — ABNORMAL HIGH (ref 70–99)

## 2019-09-17 LAB — MAGNESIUM: Magnesium: 1.8 mg/dL (ref 1.7–2.4)

## 2019-09-17 MED ORDER — AMLODIPINE BESYLATE 10 MG PO TABS
10.0000 mg | ORAL_TABLET | Freq: Every day | ORAL | Status: DC
  Administered 2019-09-17 – 2019-09-20 (×3): 10 mg via ORAL
  Filled 2019-09-17 (×4): qty 1

## 2019-09-17 MED ORDER — POTASSIUM CHLORIDE 20 MEQ/15ML (10%) PO SOLN
40.0000 meq | Freq: Once | ORAL | Status: AC
  Administered 2019-09-17: 40 meq
  Filled 2019-09-17: qty 30

## 2019-09-17 MED ORDER — SODIUM CHLORIDE 0.45 % IV SOLN: INTRAVENOUS | Status: DC

## 2019-09-17 NOTE — Progress Notes (Signed)
RT called by RN due to pt self-extubated.  Pt looks comfortable, no stridor heard post extubation, SV stable.  Pt placed on 3L [place] and Sp02 95%.  Will continue to monitor.

## 2019-09-17 NOTE — Progress Notes (Signed)
eLink Physician-Brief Progress Note Patient Name: Gowtham Buggy DOB: 1956/01/16 MRN: [id number]   Date of Service  09/17/2019  HPI/Events of Note  Pt self-extubated, he is breathing spontaneously but has considerable secretions   eICU Interventions  Bed chest physiotherapy, incentive spirometer, ABG at midnight.        Kerry Kass Rylie Limburg 09/17/2019, 10:54 PM

## 2019-09-17 NOTE — Progress Notes (Signed)
eLink Physician-Brief Progress Note Patient Name: Ryan Villa DOB: 1956/03/04 MRN: [id number]   Date of Service  09/17/2019  HPI/Events of Note  Severe metab alkalosis Hypernatremia  eICU Interventions  Dc bicarb gtt 1/2 NS @ 100 instead     Intervention Category Major Interventions: Acid-Base disturbance - evaluation and management  [name] V. [name] 09/17/2019, 3:53 AM

## 2019-09-17 NOTE — Progress Notes (Signed)
Called ELink and spoke with Elzie Rings, RN, to inform them of pt self-extubating at 2240.  Pt currently on 3L [place], sats at 923 to 95%.  Pt A/O x3, No sedation since approximately 12N today.  Pt doesn't recall fully pulling tube.  Will continue to monitor for pt's tolerance or any respiratory distress.

## 2019-09-17 NOTE — Progress Notes (Signed)
Called ELink and talked with Elta Guadeloupe, RN re: pt's continued elevated SBP 160s to 170s since 1900.  No PRN meds for elevated BP.  Awaiting for orders or return call from provider.

## 2019-09-17 NOTE — Progress Notes (Signed)
NAME:  Ryan Villa, MRN:  [id number], DOB:  Feb 09, 1956, LOS: 2 ADMISSION DATE:  09/15/2019, CONSULTATION DATE:  09/15/2019 REFERRING MD:  OS[facility] CHIEF COMPLAINT: Respiratory failure, rhabdomyolysis, acute kidney injury  Brief History   63 year old male transferred from [facility] for acute respiratory failure, rhabdomyolysis, acute kidney injury due to services unavailable at that facility.  PCCM accepted in transfer.  History of present illness   This is a 63 year old male past medical history CKD stage IV, diabetes mellitus type 2, hyperlipidemia, hypertension, left carotid stenosis, intracerebral hemorrhage, status post right BKA presented to [facility] emergency department after apparently falling at his home 3 days ago, apparently lying on the floor since that time.  EMS reported that the patient's sister was bringing him food while he was lying on the floor during that 3-day period. He is status post recent right BKA and uses a prosthetic and is supposed to use a walker but is noncompliant with his per family.  Reportedly not using his walker when he fell and landed on his right side.  Unclear if there was loss of consciousness.  At that time the patient did not want his sister to call 911.  On 12/23 she called 911 because she noticed that he was having a hard time breathing and bruising on his right shoulder.  On arrival to [facility] ED the patient was able to answer questions with difficulty in understanding his speech.  He was hypoxic on room air and placed on nonrebreather, noted to appear severely ill appearing and tachypneic.  He was also noted to be covered in dried stool/urine.  He complained of right shoulder pain.  Work-up and findings significant for hypothermia with a temperature of 93.9 F, significant ecchymosis over the right shoulder area, hypotensive, given IV fluids.  He progressed to have worsening respiratory distress, hypotension, became unresponsive and subsequently intubated,  central line placed.  He had significant metabolic derangements including sodium 149, potassium 6.8, BUN 172, creatinine 10.5.  CK 2411. Procalcitonin 0.61.  UDS negative.  pH less than 7 per ABG report.  He responded fairly well to IV fluids and briefly required pressor support.  Repeat labs showed minimal improvement in metabolic derangements.  He had some urine output, milky white in color.  Noted right humeral neck fracture on x-ray per ER paper chart, right upper lobe consolidation per chest x-ray, head CT no acute intracranial process, Diffuse parasinus inflammation.  Imaging not transferred with patient.  PCCM accepted in transfer for ICU. Past Medical History  Diabetes mellitus type 2 CKD stage IV Hyperlipidemia Hypertension Left carotid gnosis Intracerebral hemorrhage Status post right [facility] Events   12/23 admitted   Consults:  Nephrology Orthopedic  Procedures:  NA  Significant Diagnostic Tests:  NA  Micro Data:  SARS Coronavirus 2 negative per [facility] paper chart  Antimicrobials:  Ceftriaxone 1 dose 12/23 OSH Azithromycin 1 dose 12/23 OSH  Interim history/subjective:  No events overnight No new complaints  Objective   Blood pressure (!) 142/72, pulse (!) 58, temperature 98.2 F (36.8 C), temperature source Bladder, resp. rate 12, height 5\' 8"  (1.727 m), weight 68.5 kg, SpO2 100 %.    Vent Mode: PRVC FiO2 (%):  [40 %] 40 % Set Rate:  [12 bmp] 12 bmp Vt Set:  [540 mL] 540 mL PEEP:  [5 cmH20] 5 cmH20 Pressure Support:  [8 cmH20] 8 cmH20 Plateau Pressure:  [14 cmH20-15 cmH20] 15 cmH20   Intake/Output Summary (Last 24 hours) at 09/17/2019 0909  Last data filed at 09/17/2019 0800 Gross per 24 hour  Intake 6057.03 ml  Output 2452 ml  Net 3605.03 ml   Filed Weights   09/16/19 1124  Weight: 68.5 kg    Examination: General: Acute on chronically ill appearing male, NAD, sedate on vent HENT: [place]/AT, pupils sluggish, EOM-spontaneous and  ETT in place Neck: No JVD. Trachea midline. CV: RRR, Nl S1/S2 and -M/R/G Lungs: Diminished diffusely ABD: Soft, NT, ND and +BS GU: Foley on admission draining milky yellow urine EXT: Right arm in sling, withdraws all other ext to pain not command Skin: Pale, cool, dry.  Right shoulder and arm with ecchymotic areas.  Dry, scaly skin Neuro: Unresponsive.  Does not withdrawal to noxious stimuli.  Does not blink to threat.  I reviewed CXR myself, ETT ok, infiltrate noted and right humeral head displaced fracture noted  Discussed with bedside [facility] Problem list   NA  Assessment & Plan:  Vent dependent respiratory failure in a 63 year old male who is normally poor health status post fall with lying in single position for approximately 3 days with rhabdomyolysis and metabolic disturbances.  Transferred from [facility] to [facility] 09/15/2019 intubated and central line in place. Suspected pneumonia  Plan: Wean per protocol Pulmonary toilet Maintaining intensive care unit Empirical antimicrobial therapy  Rhabdomyolysis Initial CK on 1223 is 1400 continue on 1224 945 Plan: Hydration Monitor CK  Chronic kidney disease unknown baseline creatinine Suspected UTI Lab Results  Component Value Date   CREATININE 6.98 (H) 09/17/2019   CREATININE 7.66 (H) 09/16/2019   CREATININE 8.12 (H) 09/16/2019   Recent Labs  Lab 09/16/19 1743 09/17/19 0317 09/17/19 0347  K 3.5 3.4* 3.2*   Recent Labs  Lab 09/16/19 1743 09/17/19 0317 09/17/19 0347  NA 154* 156* 153*   Plan: Appreciate renal services input Continue hydration as tolerated IV fluids have been changed to half-normal saline in the setting of hypernatremia Replete electrolytes as needed Avoid nephrotoxic Empirical antimicrobial therapy  Hyperglycemia CBG (last 3)  Recent Labs    09/16/19 2322 09/17/19 0324 09/17/19 0731  GLUCAP 244* 240* 163*  Plan: Sliding scale insulin  protocol  Right proximal humerus fracture Plan: Evaluated by orthopedics on 09/15/2019 Continue right shoulder sling Will need shoulder replacement if he survives  PVD      Plan: Monitor  Generalized failure to thrive Plan : We will need social work consult  Best practice:  Diet: N.p.o. Pain/Anxiety/Delirium protocol (if indicated): As needed VAP protocol (if indicated): Yes DVT prophylaxis: Heparin GI prophylaxis: PPI Glucose control: Sliding scale insulin every 4 hours Mobility: Bedrest Code Status: Full Family Communication: 09/17/2019 lymph node update later today Disposition: Admit ICU  Labs   CBC: Recent Labs  Lab 09/15/19 1822 09/16/19 0345 09/16/19 0354 09/17/19 0317 09/17/19 0347  WBC 4.6 5.8  --  6.4  --   NEUTROABS 3.9  --   --   --   --   HGB 6.8* 7.2* 10.5* 7.4* 6.8*  HCT 21.3* 22.0* 31.0* 22.1* 20.0*  MCV 94.2 92.4  --  92.1  --   PLT 190 175  --  168  --     Basic Metabolic Panel: Recent Labs  Lab 09/15/19 1822 09/16/19 0345 09/16/19 0354 09/16/19 1227 09/16/19 1743 09/17/19 0317 09/17/19 0347  NA 151* 153* 151* 153* 154* 156* 153*  K 5.8* 4.6 4.4 3.8 3.5 3.4* 3.2*  CL 122* 121*  --  116* 116* 110  --  CO2 12* 15*  --  21* 25 31  --   GLUCOSE 211* 213*  --  309* 296* 243*  --   BUN 170* 162*  --  149* 144* 127*  --   CREATININE 9.68* 8.66*  --  8.12* 7.66* 6.98*  --   CALCIUM 7.0* 7.0*  --  7.1* 7.0* 6.9*  --   MG 2.1 2.1  --   --   --  1.8  --   PHOS 10.4* 7.2*  --   --   --  5.1*  --    GFR: Estimated Creatinine Clearance: 10.5 mL/min (A) (by C-G formula based on SCr of 6.98 mg/dL (H)). Recent Labs  Lab 09/15/19 1817 09/15/19 1822 09/15/19 2050 09/16/19 0345 09/17/19 0317  PROCALCITON  --  2.92  --   --   --   WBC  --  4.6  --  5.8 6.4  LATICACIDVEN 1.2  --  1.3  --   --     Liver Function Tests: Recent Labs  Lab 09/16/19 0345 09/17/19 0317  AST 30  --   ALT 21  --   ALKPHOS 46  --   BILITOT 0.9  --   PROT  4.3*  --   ALBUMIN 2.0* 2.0*   No results for input(s): LIPASE, AMYLASE in the last 168 hours. No results for input(s): AMMONIA in the last 168 hours.  ABG    Component Value Date/Time   PHART 7.571 (H) 09/17/2019 0347   PCO2ART 38.2 09/17/2019 0347   PO2ART 75.0 (L) 09/17/2019 0347   HCO3 35.0 (H) 09/17/2019 0347   TCO2 36 (H) 09/17/2019 0347   ACIDBASEDEF 9.0 (H) 09/16/2019 0354   O2SAT 97.0 09/17/2019 0347     Coagulation Profile: No results for input(s): INR, PROTIME in the last 168 hours.  Cardiac Enzymes: Recent Labs  Lab 09/15/19 1822 09/16/19 0345  CKTOTAL 1,452* 945*    HbA1C: Hgb A1c MFr Bld  Date/Time Value Ref Range Status  09/15/2019 06:22 PM 5.4 4.8 - 5.6 % Final    Comment:    (NOTE) Pre diabetes:          5.7%-6.4% Diabetes:              >6.4% Glycemic control for   <7.0% adults with diabetes     CBG: Recent Labs  Lab 09/16/19 1458 09/16/19 1948 09/16/19 2322 09/17/19 0324 09/17/19 0731  GLUCAP 310* 255* 244* 240* 163*   App cct 30 min  Richardson Landry Minor ACNP Acute Care Nurse Practitioner [place] Please consult Amion 09/17/2019, 9:09 AM  Attending Note:  63 year old male that is extremely disheveled and unkempt who was found down after 3 days in rhabdo, altered, renal failure and respiratory failure.  Alkalotic overnight.  Mental status remains very poor on exam but is weaning with clear lungs.  I reviewed CXR myself, ETT is in a good position.  Discussed with PCCM-NP.  Will begin PS trials but no extubation given mental status.  Minimize sedation as able.  D/C bicarb drip.  Replace electrolytes.  Appreciate input from renal.  PCCM will continue to manage in the ICU.  The patient is critically ill with multiple organ systems failure and requires high complexity decision making for assessment and support, frequent evaluation and titration of therapies, application of advanced monitoring technologies and extensive  interpretation of multiple databases.   Critical Care Time devoted to patient care services described in this note is  32  Minutes. This time reflects time of care of this signee Dr Jennet Maduro. This critical care time does not reflect procedure time, or teaching time or supervisory time of PA/NP/Med student/Med Resident etc but could involve care discussion time.  Rush Farmer, M.D. [facility] Pulmonary/Critical Care Medicine.

## 2019-09-17 NOTE — Progress Notes (Signed)
Subjective:  Hemodynamically stable in ICU overnight - 3 liters of UOP -  Fluid changed to 1/2 NS appropriately by CCM early this AM due to alkalosis  Objective Vital signs in last 24 hours: Vitals:   09/17/19 0545 09/17/19 0600 09/17/19 0615 09/17/19 0630  BP: (!) 160/66 (!) 154/77 (!) 151/73 (!) 158/73  Pulse: (!) 59 (!) 59 (!) 57 (!) 56  Resp: '12 18 12 12  ' Temp: 99 F (37.2 C) 99 F (37.2 C) 98.8 F (37.1 C) 98.6 F (37 C)  TempSrc:      SpO2: 100% 100% 100% 100%  Weight:      Height:       Weight change:   Intake/Output Summary (Last 24 hours) at 09/17/2019 0644 Last data filed at 09/17/2019 0600 Gross per 24 hour  Intake 5825.81 ml  Output 3027 ml  Net 2798.81 ml    Assessment/Plan: 63 year old WM with DM, HTN and vascualr dz s/p BKA- unclear level of functioning or level of renal function prior to admit 1.Renal- A on CRF vs AKI in the setting of mild rhabdo after fall and prolonged time in one position. Dont know what his baseline renal function is.  The good news is that he is making urine.  His BUN and creatinine are not good but have improved during the course of hosp.  No absolute indications for RRT at present.   Continue to hydrate, now with 1/2 NS and follow closely.  Urine possibly looking infected, cont foley drainage and started on rocephin.   2. Hypertension/volume  - normally hypertensive on BP meds.  Holding BP meds and hydrating for now - still does not seem wet, IVF of 100 and also add free water  3. Hyperkalmia-  Has been decreasing with medical management.  low this AM- will give some 4. Anemia  -  s/p blood transfusion -  Will check iron stores and have added on  ESA 5.  Hypernatremia-  More evidence of volume depletion-  giving free water per NGT and now IVF is 1/2 NS at [place]: Basic Metabolic Panel: Recent Labs  Lab 09/15/19 1822 09/16/19 0345 09/16/19 1227 09/16/19 1743 09/17/19 0317 09/17/19 0347  NA 151* 153*  153* 154* 156* 153*  K 5.8* 4.6 3.8 3.5 3.4* 3.2*  CL 122* 121* 116* 116* 110  --   CO2 12* 15* 21* 25 31  --   GLUCOSE 211* 213* 309* 296* 243*  --   BUN 170* 162* 149* 144* 127*  --   CREATININE 9.68* 8.66* 8.12* 7.66* 6.98*  --   CALCIUM 7.0* 7.0* 7.1* 7.0* 6.9*  --   PHOS 10.4* 7.2*  --   --  5.1*  --    Liver Function Tests: Recent Labs  Lab 09/16/19 0345 09/17/19 0317  AST 30  --   ALT 21  --   ALKPHOS 46  --   BILITOT 0.9  --   PROT 4.3*  --   ALBUMIN 2.0* 2.0*   No results for input(s): LIPASE, AMYLASE in the last 168 hours. No results for input(s): AMMONIA in the last 168 hours. CBC: Recent Labs  Lab 09/15/19 1822 09/16/19 0345 09/16/19 0354 09/17/19 0317 09/17/19 0347  WBC 4.6 5.8  --  6.4  --   NEUTROABS 3.9  --   --   --   --   HGB 6.8* 7.2* 10.5* 7.4* 6.8*  HCT 21.3* 22.0* 31.0*  22.1* 20.0*  MCV 94.2 92.4  --  92.1  --   PLT 190 175  --  168  --    Cardiac Enzymes: Recent Labs  Lab 09/15/19 1822 09/16/19 0345  CKTOTAL 1,452* 945*   CBG: Recent Labs  Lab 09/16/19 1109 09/16/19 1458 09/16/19 1948 09/16/19 2322 09/17/19 0324  GLUCAP 237* 310* 255* 244* 240*    Iron Studies: No results for input(s): IRON, TIBC, TRANSFERRIN, FERRITIN in the last 72 hours. Studies/Results: DG Shoulder 1V Right  Result Date: 09/15/2019 CLINICAL DATA:  Fall EXAM: RIGHT SHOULDER - 1 VIEW COMPARISON:  None. FINDINGS: Comminuted proximal right humerus fracture through the surgical neck with approximately 5 cm anterior displacement of the distal fracture fragment, with over riding of the fracture fragments and with probable posterior dislocation of the right humeral head fracture fragment at glenohumeral joint. No suspicious focal osseous lesions. Patchy parahilar upper right lung opacity noted. IMPRESSION: Prominently displaced comminuted proximal right humerus fracture with probable posterior dislocation of the right humeral head fracture fragment at the glenohumeral  joint, poorly assessed on this single frontal view. Patchy parahilar upper right lung opacity noted, please see concurrent chest radiograph report. Electronically Signed   By: Ilona Sorrel M.D.   On: 09/15/2019 18:24   DG Wrist 2 Views Right  Result Date: 09/15/2019 CLINICAL DATA:  Fall EXAM: RIGHT WRIST - 2 VIEW COMPARISON:  None. FINDINGS: The lateral view is obliquely positioned, limiting assessment. No evidence of fracture or dislocation. No suspicious focal osseous lesions. Mild scaphoid-trapezium osteoarthritis. Vascular calcifications in the soft tissues. No radiopaque foreign body. IMPRESSION: No evidence of right wrist fracture or dislocation, limitations as detailed. Electronically Signed   By: Ilona Sorrel M.D.   On: 09/15/2019 18:25   CT HEAD WO CONTRAST  Result Date: 09/16/2019 CLINICAL DATA:  63 year old with acute encephalopathy. Patient fell at home on 09/12/2019 and it is unclear if there was loss of consciousness. EXAM: CT HEAD WITHOUT CONTRAST TECHNIQUE: Contiguous axial images were obtained from the base of the skull through the vertex without intravenous contrast. COMPARISON:  09/15/2019 and earlier at [facility]. FINDINGS: Brain: Ventricular system normal in size and appearance for age. Multiple remote lacunar strokes in the basal ganglia and thalami bilaterally. Mild cortical atrophy, unchanged. Mild changes of small vessel disease of the white matter diffusely, unchanged. No mass lesion. No midline shift. No acute hemorrhage or hematoma. No extra-axial fluid collections. No evidence of acute infarction. Vascular: Severe BILATERAL carotid siphon and vertebral artery atherosclerosis. No hyperdense vessel. Skull: No skull fracture or other focal osseous abnormality involving the skull. Sinuses/Orbits: Mucosal thickening involving the maxillary and sphenoid sinuses bilaterally, with air-fluid levels in the RIGHT maxillary and LEFT sphenoid sinuses. Opacification of ethmoid air  cells bilaterally. Hypoplastic frontal sinuses. Complete opacification of the mastoid air cells and opacification of the middle ear cavities bilaterally. Visualized orbits and globes unremarkable. Other: None. IMPRESSION: 1. No acute intracranial abnormality. 2. Stable mild cortical atrophy and mild chronic microvascular ischemic changes of the white matter diffusely. 3. Multiple remote lacunar strokes in the basal ganglia and thalami bilaterally. 4. Chronic pansinusitis with air-fluid levels in the RIGHT maxillary and LEFT sphenoid sinus is likely indicating acute disease. 5. Bilateral mastoid effusions and bilateral otitis media. Electronically Signed   By: Evangeline Dakin M.D.   On: 09/16/2019 10:25   DG Chest Port 1 View  Result Date: 09/17/2019 CLINICAL DATA:  63 year old male intubated. Multilobar pneumonia on recent CT. EXAM: PORTABLE CHEST 1  VIEW COMPARISON:  Portable chest 09/16/2019 and earlier. FINDINGS: Portable AP semi upright view at 0525 hours. Endotracheal tube tip stable at the level the clavicles. Stable visible enteric tube. Stable right IJ central line. Mildly lower lung volumes. Streaky and confluent perihilar opacity, mildly increased along the inferior left hilum, but stable elsewhere. No superimposed pneumothorax, pulmonary edema or pleural effusion. Stable cardiac size and mediastinal contours. Stable visualized osseous structures, including proximal right humerus fracture. IMPRESSION: 1. Stable lines and tubes. 2. Multifocal bilateral pneumonia appears not significantly changed since the CT on 09/15/2019. 3. No new cardiopulmonary abnormality. Electronically Signed   By: Genevie Ann M.D.   On: 09/17/2019 05:52   DG Chest Port 1 View  Result Date: 09/16/2019 CLINICAL DATA:  63 year old male intubated. Multilobar pneumonia suspected on CT. EXAM: PORTABLE CHEST 1 VIEW COMPARISON:  Portable chest and CT chest yesterday and earlier. FINDINGS: Portable AP semi upright view at 0446 hours.  Stable endotracheal tube tip at the level the clavicles. Stable visible enteric tube. Stable right IJ central line. Lower lobe consolidation better demonstrated by CT yesterday. Right upper lobe and right infrahilar airspace opacity appears mildly progressed from the portable film yesterday. The left upper lobe remains clear. No superimposed pneumothorax or pleural effusion. Stable cardiac size and mediastinal contours. Stable visualized osseous structures., including proximal right humerus fracture. IMPRESSION: 1. Stable lines and tubes. 2. Multilobar pneumonia suspected on CT yesterday. Right upper lobe and right perihilar airspace opacity appears increased since that time. 3. No pleural effusion. Electronically Signed   By: Genevie Ann M.D.   On: 09/16/2019 06:49   DG Chest Port 1 View  Result Date: 09/15/2019 CLINICAL DATA:  Intubated EXAM: PORTABLE CHEST 1 VIEW COMPARISON:  Chest radiograph from earlier today. FINDINGS: Endotracheal tube tip is 4.0 cm above the carina. Enteric tube enters stomach with the tip not definitely seen on this image. Right internal jugular central venous catheter terminates in the lower third of the SVC. Stable cardiomediastinal silhouette with normal heart size. No pneumothorax. No pleural effusion. Patchy upper right parahilar opacity, similar. Displaced proximal right humeral head fracture of uncertain chronicity again noted. IMPRESSION: 1. Well-positioned support structures. 2. Similar patchy upper right parahilar lung opacity suggesting asymmetric pulmonary edema or pneumonia. Electronically Signed   By: Ilona Sorrel M.D.   On: 09/15/2019 18:20   DG Abd Portable 1V  Result Date: 09/15/2019 CLINICAL DATA:  Enteric tube placement EXAM: PORTABLE ABDOMEN - 1 VIEW COMPARISON:  None. FINDINGS: Enteric tube courses through the stomach and loops in descending duodenum with the tip oriented superiorly likely in the region of the duodenal bulb with possible kink in the lower  descending duodenum. No disproportionately dilated small bowel loops. No evidence of pneumatosis or pneumoperitoneum. Moderate rectal stool. IMPRESSION: Enteric tube loops in the descending duodenum with possible kink, with tip likely in the duodenal bulb region. Suggest retracting the enteric tube approximately 6-8 cm. These results will be called to the ordering clinician or representative by the Radiologist Assistant, and communication documented in the PACS or zVision Dashboard. Electronically Signed   By: Ilona Sorrel M.D.   On: 09/15/2019 18:22   DG Humerus Left  Result Date: 09/15/2019 CLINICAL DATA:  Fall EXAM: LEFT HUMERUS - 2+ VIEW COMPARISON:  None. FINDINGS: Numerous external leads obscure visualization. Peripheral intravenous catheter in the left antecubital space. No evidence of fracture. No suspicious focal osseous lesions. IMPRESSION: No evidence of left humerus fracture on these limited views. Electronically Signed  By: Ilona Sorrel M.D.   On: 09/15/2019 18:26   CT Extrem Up Entire Arm R WO/CM  Result Date: 09/15/2019 CLINICAL DATA:  63 year old male with right humeral fracture. EXAM: CT OF THE UPPER RIGHT EXTREMITY WITHOUT CONTRAST TECHNIQUE: Multidetector CT imaging of the upper right extremity was performed according to the standard protocol. COMPARISON:  Radiograph dated 09/15/2019. FINDINGS: Evaluation of this exam is limited due to motion artifact. Bones/Joint/Cartilage There is a displaced transverse fracture of the right humeral neck. There is anterior displacement of the humeral shaft in relation to the humeral head and approximately 2 cm overlap. There is a comminuted fracture of the right humeral head with mild displacement of the greater tuberosity. There is internal rotation of the humeral head. No dislocation. Ligaments Suboptimally assessed by CT. Muscles and Tendons There is extensive edema in the soft tissues surrounding the right shoulder. Soft tissues Diffuse  subcutaneous soft tissue edema of the right upper extremity. No large fluid collection or hematoma. An endotracheal tube noted above the carina. An enteric tube is partially visualized. Consolidative changes in the right lung most concerning for pneumonia although aspiration is not excluded. Probable small right pleural effusion. Advanced atherosclerotic calcification of the aorta. Diffusely thickened appearance of the bladder wall with air within the urinary bladder which may be related to recent instrumentation. Clinical correlation is recommended. Right IJ central venous line. IMPRESSION: 1. Comminuted fracture of the humeral head with a displaced transverse fracture of the humeral neck. No dislocation. 2. Consolidative changes of the right lung most concerning for pneumonia although aspiration is not excluded. Clinical correlation is recommended. 3. Thickened appearance of the bladder wall with air within the urinary bladder which may be related to recent instrumentation. Clinical correlation is recommended. Electronically Signed   By: Anner Crete M.D.   On: 09/15/2019 22:35   Medications: Infusions: . sodium chloride 100 mL/hr at 09/17/19 0600  . sodium chloride 10 mL/hr at 09/17/19 0600  . azithromycin Stopped (09/16/19 1922)  . cefTRIAXone (ROCEPHIN)  IV 1 g (09/16/19 1731)  . dexmedetomidine (PRECEDEX) IV infusion 0.4 mcg/kg/hr (09/17/19 0600)    Scheduled Medications: . sodium chloride   Intravenous Once  . amLODipine  10 mg Oral Daily  . chlorhexidine gluconate (MEDLINE KIT)  15 mL Mouth Rinse BID  . Chlorhexidine Gluconate Cloth  6 each Topical Daily  . darbepoetin (ARANESP) injection - NON-DIALYSIS  100 mcg Subcutaneous Q Thu-1800  . free water  300 mL Per Tube Q6H  . heparin  5,000 Units Subcutaneous Q8H  . hydrocortisone sodium succinate  50 mg Intravenous Q6H  . insulin aspart  0-9 Units Subcutaneous Q4H  . mouth rinse  15 mL Mouth Rinse 10 times per day  . pantoprazole  (PROTONIX) IV  40 mg Intravenous QHS    have reviewed scheduled and prn medications.  Physical Exam: General: still pretty unresponsive, now on sedatuib Heart: RRR Lungs: mostly clear Abdomen: soft, non tender Extremities: no peripheral edema-  Right arm bruised     09/17/2019,6:44 AM  LOS: 2 days

## 2019-09-18 ENCOUNTER — Inpatient Hospital Stay (HOSPITAL_COMMUNITY): Payer: Medicare Other

## 2019-09-18 LAB — CBC WITH DIFFERENTIAL/PLATELET
Abs Immature Granulocytes: 0.25 10*3/uL — ABNORMAL HIGH (ref 0.00–0.07)
Basophils Absolute: 0 10*3/uL (ref 0.0–0.1)
Basophils Relative: 0 %
Eosinophils Absolute: 0 10*3/uL (ref 0.0–0.5)
Eosinophils Relative: 0 %
HCT: 25.6 % — ABNORMAL LOW (ref 39.0–52.0)
Hemoglobin: 8.3 g/dL — ABNORMAL LOW (ref 13.0–17.0)
Immature Granulocytes: 2 %
Lymphocytes Relative: 9 %
Lymphs Abs: 1 10*3/uL (ref 0.7–4.0)
MCH: 30.6 pg (ref 26.0–34.0)
MCHC: 32.4 g/dL (ref 30.0–36.0)
MCV: 94.5 fL (ref 80.0–100.0)
Monocytes Absolute: 0.7 10*3/uL (ref 0.1–1.0)
Monocytes Relative: 6 %
Neutro Abs: 8.6 10*3/uL — ABNORMAL HIGH (ref 1.7–7.7)
Neutrophils Relative %: 83 %
Platelets: 150 10*3/uL (ref 150–400)
RBC: 2.71 MIL/uL — ABNORMAL LOW (ref 4.22–5.81)
RDW: 13.7 % (ref 11.5–15.5)
WBC: 10.4 10*3/uL (ref 4.0–10.5)
nRBC: 0.4 % — ABNORMAL HIGH (ref 0.0–0.2)

## 2019-09-18 LAB — URINE CULTURE: Culture: 80000 — AB

## 2019-09-18 LAB — GLUCOSE, CAPILLARY
Glucose-Capillary: 120 mg/dL — ABNORMAL HIGH (ref 70–99)
Glucose-Capillary: 121 mg/dL — ABNORMAL HIGH (ref 70–99)
Glucose-Capillary: 145 mg/dL — ABNORMAL HIGH (ref 70–99)
Glucose-Capillary: 180 mg/dL — ABNORMAL HIGH (ref 70–99)
Glucose-Capillary: 187 mg/dL — ABNORMAL HIGH (ref 70–99)
Glucose-Capillary: 225 mg/dL — ABNORMAL HIGH (ref 70–99)

## 2019-09-18 LAB — IRON AND TIBC
Iron: 46 ug/dL (ref 45–182)
Saturation Ratios: 37 % (ref 17.9–39.5)
TIBC: 123 ug/dL — ABNORMAL LOW (ref 250–450)
UIBC: 77 ug/dL

## 2019-09-18 LAB — RENAL FUNCTION PANEL
Albumin: 2 g/dL — ABNORMAL LOW (ref 3.5–5.0)
Anion gap: 11 (ref 5–15)
BUN: 105 mg/dL — ABNORMAL HIGH (ref 8–23)
CO2: 31 mmol/L (ref 22–32)
Calcium: 7 mg/dL — ABNORMAL LOW (ref 8.9–10.3)
Chloride: 113 mmol/L — ABNORMAL HIGH (ref 98–111)
Creatinine, Ser: 5.87 mg/dL — ABNORMAL HIGH (ref 0.61–1.24)
GFR calc Af Amer: 11 mL/min — ABNORMAL LOW (ref >60)
GFR calc non Af Amer: 9 mL/min — ABNORMAL LOW (ref >60)
Glucose, Bld: 157 mg/dL — ABNORMAL HIGH (ref 70–99)
Phosphorus: 4.7 mg/dL — ABNORMAL HIGH (ref 2.5–4.6)
Potassium: 3.4 mmol/L — ABNORMAL LOW (ref 3.5–5.1)
Sodium: 155 mmol/L — ABNORMAL HIGH (ref 135–145)

## 2019-09-18 LAB — MAGNESIUM: Magnesium: 1.7 mg/dL (ref 1.7–2.4)

## 2019-09-18 LAB — FERRITIN: Ferritin: 258 ng/mL (ref 24–336)

## 2019-09-18 MED ORDER — POTASSIUM CHLORIDE 10 MEQ/50ML IV SOLN
10.0000 meq | INTRAVENOUS | Status: AC
  Administered 2019-09-18 (×3): 10 meq via INTRAVENOUS
  Filled 2019-09-18 (×3): qty 50

## 2019-09-18 MED ORDER — POTASSIUM CHLORIDE 20 MEQ/15ML (10%) PO SOLN
40.0000 meq | Freq: Once | ORAL | Status: AC
  Administered 2019-09-18: 40 meq
  Filled 2019-09-18: qty 30

## 2019-09-18 MED ORDER — DEXTROSE 5 % IV SOLN: INTRAVENOUS | Status: DC

## 2019-09-18 MED ORDER — HYDRALAZINE HCL 20 MG/ML IJ SOLN
10.0000 mg | INTRAMUSCULAR | Status: DC | PRN
  Administered 2019-09-18 – 2019-10-05 (×3): 10 mg via INTRAVENOUS
  Filled 2019-09-18 (×3): qty 1

## 2019-09-18 MED ORDER — LACTATED RINGERS IV SOLN: INTRAVENOUS | Status: DC

## 2019-09-18 NOTE — Progress Notes (Signed)
Subjective:  Self extubated overnight but holding-  If anything BP is high-  Made 3600 of urine- fever   Objective Vital signs in last 24 hours: Vitals:   09/18/19 0400 09/18/19 0500 09/18/19 0600 09/18/19 0602  BP: (!) 179/85 (!) 151/111 (!) 174/121 (!) 173/89  Pulse: 66 67 66 64  Resp: _0 (!) 23  Temp: (!) 100.4 F (38 C) (!) 100.6 F (38.1 C) (!) 100.6 F (38.1 C) (!) 100.6 F (38.1 C)  TempSrc:      SpO2: 97% 93% 100% 97%  Weight:  69.7 kg    Height:       Weight change: 1.2 kg  Intake/Output Summary (Last 24 hours) at 09/18/2019 5638 Last data filed at 09/18/2019 0600 Gross per 24 hour  Intake 2862.96 ml  Output 3615 ml  Net -752.04 ml    Assessment/Plan: 63 year old WM with DM, HTN and vascualr dz s/p BKA- unclear level of functioning or level of renal function prior to admit 1.Renal- A on CRF vs AKI in the setting of mild rhabdo after fall and prolonged time in one position. Dont know what his baseline renal function is.  The good news is that he is making urine.  His BUN and creatinine are not good but have improved during the course of hosp.  No absolute indications for RRT at present.   Continue to hydrate, now change to d5w given sodium and follow closely.  Urine possibly looking infected, cont foley drainage and on rocephin.   2. Hypertension/volume  - normally hypertensive on BP meds.  hydrated, now BP is up- amlodipine restarted yesterday 3. Hyperkalmia-  Has been decreasing with medical management.  low this AM- will give some 4. Anemia  -  s/p blood transfusion -   iron stores OK  and have added on  ESA 5.  Hypernatremia-  More evidence of volume depletion-  giving free water per NGT and now IVF will be d5w-  Increased to a Total of 3.6 liters of free water slated for next 24 hours  [name]    Labs: Basic Metabolic Panel: Recent Labs  Lab 09/16/19 0345 09/16/19 0354 09/17/19 0317 09/17/19 1309 09/17/19 2353 09/18/19 0353  NA 153*   --  156* 154* 152* 155*  K 4.6  --  3.4* 3.5 3.4* 3.4*  CL 121*   < > 110 113*  --  113*  CO2 15*   < > 31 30  --  31  GLUCOSE 213*   < > 243* 174*  --  157*  BUN 162*   < > 127* 116*  --  105*  CREATININE 8.66*   < > 6.98* 6.44*  --  5.87*  CALCIUM 7.0*   < > 6.9* 6.6*  --  7.0*  PHOS 7.2*  --  5.1*  --   --  4.7*   < > = values in this interval not displayed.   Liver Function Tests: Recent Labs  Lab 09/16/19 0345 09/17/19 0317 09/18/19 0353  AST 30  --   --   ALT 21  --   --   ALKPHOS 46  --   --   BILITOT 0.9  --   --   PROT 4.3*  --   --   ALBUMIN 2.0* 2.0* 2.0*   No results for input(s): LIPASE, AMYLASE in the last 168 hours. No results for input(s): AMMONIA in the last 168 hours. CBC: Recent Labs  Lab 09/15/19  1822 09/16/19 0345 09/17/19 0317 09/17/19 0347 09/17/19 2353 09/18/19 0353  WBC 4.6 5.8 6.4  --   --  10.4  NEUTROABS 3.9  --   --   --   --  8.6*  HGB 6.8* 7.2* 7.4* 6.8* 7.5* 8.3*  HCT 21.3* 22.0* 22.1* 20.0* 22.0* 25.6*  MCV 94.2 92.4 92.1  --   --  94.5  PLT 190 175 168  --   --  150   Cardiac Enzymes: Recent Labs  Lab 09/15/19 1822 09/16/19 0345  CKTOTAL 1,452* 945*   CBG: Recent Labs  Lab 09/17/19 1235 09/17/19 1541 09/17/19 1951 09/18/19 0005 09/18/19 0347  GLUCAP 160* 146* 149* 145* 121*    Iron Studies:  Recent Labs    09/18/19 0353  IRON 46  TIBC 123*  FERRITIN 258   Studies/Results: CT HEAD WO CONTRAST  Result Date: 09/16/2019 CLINICAL DATA:  63 year old with acute encephalopathy. Patient fell at home on 09/12/2019 and it is unclear if there was loss of consciousness. EXAM: CT HEAD WITHOUT CONTRAST TECHNIQUE: Contiguous axial images were obtained from the base of the skull through the vertex without intravenous contrast. COMPARISON:  09/15/2019 and earlier at [facility]. FINDINGS: Brain: Ventricular system normal in size and appearance for age. Multiple remote lacunar strokes in the basal ganglia and thalami  bilaterally. Mild cortical atrophy, unchanged. Mild changes of small vessel disease of the white matter diffusely, unchanged. No mass lesion. No midline shift. No acute hemorrhage or hematoma. No extra-axial fluid collections. No evidence of acute infarction. Vascular: Severe BILATERAL carotid siphon and vertebral artery atherosclerosis. No hyperdense vessel. Skull: No skull fracture or other focal osseous abnormality involving the skull. Sinuses/Orbits: Mucosal thickening involving the maxillary and sphenoid sinuses bilaterally, with air-fluid levels in the RIGHT maxillary and LEFT sphenoid sinuses. Opacification of ethmoid air cells bilaterally. Hypoplastic frontal sinuses. Complete opacification of the mastoid air cells and opacification of the middle ear cavities bilaterally. Visualized orbits and globes unremarkable. Other: None. IMPRESSION: 1. No acute intracranial abnormality. 2. Stable mild cortical atrophy and mild chronic microvascular ischemic changes of the white matter diffusely. 3. Multiple remote lacunar strokes in the basal ganglia and thalami bilaterally. 4. Chronic pansinusitis with air-fluid levels in the RIGHT maxillary and LEFT sphenoid sinus is likely indicating acute disease. 5. Bilateral mastoid effusions and bilateral otitis media. Electronically Signed   By: Evangeline Dakin M.D.   On: 09/16/2019 10:25   DG Chest Port 1 View  Result Date: 09/17/2019 CLINICAL DATA:  63 year old male intubated. Multilobar pneumonia on recent CT. EXAM: PORTABLE CHEST 1 VIEW COMPARISON:  Portable chest 09/16/2019 and earlier. FINDINGS: Portable AP semi upright view at 0525 hours. Endotracheal tube tip stable at the level the clavicles. Stable visible enteric tube. Stable right IJ central line. Mildly lower lung volumes. Streaky and confluent perihilar opacity, mildly increased along the inferior left hilum, but stable elsewhere. No superimposed pneumothorax, pulmonary edema or pleural effusion. Stable  cardiac size and mediastinal contours. Stable visualized osseous structures, including proximal right humerus fracture. IMPRESSION: 1. Stable lines and tubes. 2. Multifocal bilateral pneumonia appears not significantly changed since the CT on 09/15/2019. 3. No new cardiopulmonary abnormality. Electronically Signed   By: Genevie Ann M.D.   On: 09/17/2019 05:52   Medications: Infusions: . sodium chloride 100 mL/hr at 09/18/19 0600  . sodium chloride 10 mL/hr at 09/18/19 0600  . azithromycin Stopped (09/17/19 1839)  . cefTRIAXone (ROCEPHIN)  IV Stopped (09/17/19 1717)  . dexmedetomidine (PRECEDEX) IV infusion  Stopped (09/17/19 7741)    Scheduled Medications: . sodium chloride   Intravenous Once  . amLODipine  10 mg Oral Daily  . chlorhexidine gluconate (MEDLINE KIT)  15 mL Mouth Rinse BID  . Chlorhexidine Gluconate Cloth  6 each Topical Daily  . darbepoetin (ARANESP) injection - NON-DIALYSIS  100 mcg Subcutaneous Q Thu-1800  . free water  300 mL Per Tube Q6H  . heparin  5,000 Units Subcutaneous Q8H  . hydrocortisone sodium succinate  50 mg Intravenous Q6H  . insulin aspart  0-9 Units Subcutaneous Q4H  . mouth rinse  15 mL Mouth Rinse 10 times per day  . pantoprazole (PROTONIX) IV  40 mg Intravenous QHS    have reviewed scheduled and prn medications.  Physical Exam: General: extubated -  Appears alert, said much that I did not understand-  Said he was at his Moms house Heart: RRR Lungs: mostly clear Abdomen: soft, non tender Extremities: no peripheral edema-  Right arm bruised     09/18/2019,7:07 AM  LOS: 3 days

## 2019-09-18 NOTE — Progress Notes (Addendum)
NAME:  Brylee Walquist, MRN:  [id number], DOB:  10-05-1955, LOS: 3 ADMISSION DATE:  09/15/2019, CONSULTATION DATE:  09/15/2019 REFERRING MD:  OS[facility] CHIEF COMPLAINT: Respiratory failure, rhabdomyolysis, acute kidney injury  Brief History   63 year old male transferred from [facility] for acute respiratory failure, rhabdomyolysis, acute kidney injury due to services unavailable at that facility.  PCCM accepted in transfer.  History of present illness   This is a 63 year old male past medical history CKD stage IV, diabetes mellitus type 2, hyperlipidemia, hypertension, left carotid stenosis, intracerebral hemorrhage, status post right BKA presented to [facility] emergency department after apparently falling at his home 3 days ago, apparently lying on the floor since that time.  EMS reported that the patient's sister was bringing him food while he was lying on the floor during that 3-day period. He is status post recent right BKA and uses a prosthetic and is supposed to use a walker but is noncompliant with his per family.  Reportedly not using his walker when he fell and landed on his right side.  Unclear if there was loss of consciousness.  At that time the patient did not want his sister to call 911.  On 12/23 she called 911 because she noticed that he was having a hard time breathing and bruising on his right shoulder.  On arrival to [facility] ED the patient was able to answer questions with difficulty in understanding his speech.  He was hypoxic on room air and placed on nonrebreather, noted to appear severely ill appearing and tachypneic.  He was also noted to be covered in dried stool/urine.  He complained of right shoulder pain.  Work-up and findings significant for hypothermia with a temperature of 93.9 F, significant ecchymosis over the right shoulder area, hypotensive, given IV fluids.  He progressed to have worsening respiratory distress, hypotension, became unresponsive and subsequently intubated,  central line placed.  He had significant metabolic derangements including sodium 149, potassium 6.8, BUN 172, creatinine 10.5.  CK 2411. Procalcitonin 0.61.  UDS negative.  pH less than 7 per ABG report.  He responded fairly well to IV fluids and briefly required pressor support.  Repeat labs showed minimal improvement in metabolic derangements.  He had some urine output, milky white in color.  Noted right humeral neck fracture on x-ray per ER paper chart, right upper lobe consolidation per chest x-ray, head CT no acute intracranial process, Diffuse parasinus inflammation.  Imaging not transferred with patient.  PCCM accepted in transfer for ICU. Past Medical History  Diabetes mellitus type 2 CKD stage IV Hyperlipidemia Hypertension Left carotid gnosis Intracerebral hemorrhage Status post right [place] [facility] Events   12/23 admitted   Consults:  Nephrology Orthopedic  Procedures:  NA  Significant Diagnostic Tests:  NA  Micro Data:  SARS Coronavirus 2 negative per [facility] paper chart  Antimicrobials:  Ceftriaxone 1 dose 12/23 OSH>> 1220 Azithromycin 1 dose 12/23 OSH>>12/26  Interim history/subjective:  Self extubated overnight  Objective   Blood pressure (!) 173/89, pulse 64, temperature (!) 100.6 F (38.1 C), resp. rate (!) 23, height 5\' 8"  (1.727 m), weight 69.7 kg, SpO2 97 %.    Vent Mode: PRVC FiO2 (%):  [40 %] 40 % Set Rate:  [12 bmp] 12 bmp Vt Set:  [540 mL] 540 mL PEEP:  [5 cmH20] 5 cmH20 Pressure Support:  [8 cmH20] 8 cmH20 Plateau Pressure:  [16 cmH20] 16 cmH20   Intake/Output Summary (Last 24 hours) at 09/18/2019 1058 Last data filed at 09/18/2019  0600 Gross per 24 hour  Intake 2528.47 ml  Output 3195 ml  Net -666.53 ml   Filed Weights   09/16/19 1124 09/18/19 0500  Weight: 68.5 kg 69.7 kg    Examination: General: Disheveled male who follows commands is no acute distress HEENT: JVD no lymphadenopathy is appreciated poor  dentition Neuro: Grossly intact follows commands CV: Heart sounds are regular PULM: Decreased breath sounds throughout mild rhonchi  GI: soft, bsx4 active  GU: Dirty urine Extremities: Right BKA in left foot appears not to be in ambulation in the long time Skin: no rashes or lesions   Resolved Hospital Problem list   NA  Assessment & Plan:  Vent dependent respiratory failure in a 63 year old male who is normally poor health status post fall with lying in single position for approximately 3 days with rhabdomyolysis and metabolic disturbances.  Transferred from [facility] hospital to [facility] 09/15/2019 intubated and central line in place. Suspected pneumonia  Plan: Pulmonary toilet Self extubated 09/18/2019 Swallow evaluation May need NG tube  Rhabdomyolysis Initial CK on 1223 is 1400 continue on 1224 945 Plan: IV hydration CK ordered for in a.m.  Chronic kidney disease unknown baseline creatinine Suspected UTI Lab Results  Component Value Date   CREATININE 5.87 (H) 09/18/2019   CREATININE 6.44 (H) 09/17/2019   CREATININE 6.98 (H) 09/17/2019   Recent Labs  Lab 09/17/19 1309 09/17/19 2353 09/18/19 0353  K 3.5 3.4* 3.4*   Recent Labs  Lab 09/17/19 1309 09/17/19 2353 09/18/19 0353  NA 154* 152* 155*   Plan: Appreciate nephrology input IV fluid hydration with D5W IV fluids been changed to D5W for hypernatremia Replete electrolytes as needed Continue empirical antimicrobial therapy  Hyperglycemia CBG (last 3)  Recent Labs    09/18/19 0005 09/18/19 0347 09/18/19 0747  GLUCAP 145* 121* 120*  Plan: Sliding scale insulin protocol  Right proximal humerus fracture Plan: Orthopedics is following Shoulder replacement in the future if he is able to undergo surgery  PVD      Plan: Monitor  Generalized failure to thrive Plan : Most likely need social worker consult  Best practice:  Diet: N.p.o. Pain/Anxiety/Delirium protocol (if  indicated): As needed VAP protocol (if indicated): Yes DVT prophylaxis: Heparin GI prophylaxis: PPI Glucose control: Sliding scale insulin every 4 hours Mobility: Bedrest Code Status: Full Family Communication: 09/18/2019 patient updated at bedside Disposition: Admit ICU  Labs   CBC: Recent Labs  Lab 09/15/19 1822 09/16/19 0345 09/16/19 0354 09/17/19 0317 09/17/19 0347 09/17/19 2353 09/18/19 0353  WBC 4.6 5.8  --  6.4  --   --  10.4  NEUTROABS 3.9  --   --   --   --   --  8.6*  HGB 6.8* 7.2* 10.5* 7.4* 6.8* 7.5* 8.3*  HCT 21.3* 22.0* 31.0* 22.1* 20.0* 22.0* 25.6*  MCV 94.2 92.4  --  92.1  --   --  94.5  PLT 190 175  --  168  --   --  [id number]    Basic Metabolic Panel: Recent Labs  Lab 09/15/19 1822 09/16/19 0345 09/16/19 1227 09/16/19 1743 09/17/19 0317 09/17/19 0347 09/17/19 1309 09/17/19 2353 09/18/19 0353  NA 151* 153* 153* 154* 156* 153* 154* 152* 155*  K 5.8* 4.6 3.8 3.5 3.4* 3.2* 3.5 3.4* 3.4*  CL 122* 121* 116* 116* 110  --  113*  --  113*  CO2 12* 15* 21* 25 31  --  30  --  31  GLUCOSE 211* 213*  309* 296* 243*  --  174*  --  157*  BUN 170* 162* 149* 144* 127*  --  116*  --  105*  CREATININE 9.68* 8.66* 8.12* 7.66* 6.98*  --  6.44*  --  5.87*  CALCIUM 7.0* 7.0* 7.1* 7.0* 6.9*  --  6.6*  --  7.0*  MG 2.1 2.1  --   --  1.8  --   --   --  1.7  PHOS 10.4* 7.2*  --   --  5.1*  --   --   --  4.7*   GFR: Estimated Creatinine Clearance: 12.5 mL/min (A) (by C-G formula based on SCr of 5.87 mg/dL (H)). Recent Labs  Lab 09/15/19 1817 09/15/19 1822 09/15/19 2050 09/16/19 0345 09/17/19 0317 09/18/19 0353  PROCALCITON  --  2.92  --   --   --   --   WBC  --  4.6  --  5.8 6.4 10.4  LATICACIDVEN 1.2  --  1.3  --   --   --     Liver Function Tests: Recent Labs  Lab 09/16/19 0345 09/17/19 0317 09/18/19 0353  AST 30  --   --   ALT 21  --   --   ALKPHOS 46  --   --   BILITOT 0.9  --   --   PROT 4.3*  --   --   ALBUMIN 2.0* 2.0* 2.0*   No results for  input(s): LIPASE, AMYLASE in the last 168 hours. No results for input(s): AMMONIA in the last 168 hours.  ABG    Component Value Date/Time   PHART 7.494 (H) 09/17/2019 2353   PCO2ART 40.8 09/17/2019 2353   PO2ART 55.0 (L) 09/17/2019 2353   HCO3 31.1 (H) 09/17/2019 2353   TCO2 32 09/17/2019 2353   ACIDBASEDEF 9.0 (H) 09/16/2019 0354   O2SAT 89.0 09/17/2019 2353     Coagulation Profile: No results for input(s): INR, PROTIME in the last 168 hours.  Cardiac Enzymes: Recent Labs  Lab 09/15/19 1822 09/16/19 0345  CKTOTAL 1,452* 945*    HbA1C: Hgb A1c MFr Bld  Date/Time Value Ref Range Status  09/15/2019 06:22 PM 5.4 4.8 - 5.6 % Final    Comment:    (NOTE) Pre diabetes:          5.7%-6.4% Diabetes:              >6.4% Glycemic control for   <7.0% adults with diabetes     CBG: Recent Labs  Lab 09/17/19 1541 09/17/19 1951 09/18/19 0005 09/18/19 0347 09/18/19 0747  GLUCAP 146* 149* 145* 121* 120*   App cct 30 min  Richardson Landry Minor ACNP Acute Care Nurse Practitioner [place] Please consult [place] 09/18/2019, 10:58 AM  Attending Note:  63 year old male with extensive PMH to include failure to thrive that was found down after 3 days and developed rhabdo and renal failure.  Intubated due to altered mental status.  On exam, clear lungs.  I reviewed CXR myself, ETT is in a good position.  Discussed with PCCM-NP.  Titrate O2 for sat of 88-92%.  Attempt to get dex off today and hopefully transfer out of the ICU if off.  D/C pressors with volume resuscitation.  Insert NGT and restart TF.  PCCM will transfer to [facility] if patient does well in the afternoon.  The patient is critically ill with multiple organ systems failure and requires high complexity decision making for assessment and support, frequent evaluation  and titration of therapies, application of advanced monitoring technologies and extensive interpretation of multiple databases.   Critical Care Time  devoted to patient care services described in this note is  32  Minutes. This time reflects time of care of this signee Dr Jennet Maduro. This critical care time does not reflect procedure time, or teaching time or supervisory time of PA/NP/Med student/Med Resident etc but could involve care discussion time.  Rush Farmer, M.D. [facility] Pulmonary/Critical Care Medicine.

## 2019-09-19 ENCOUNTER — Inpatient Hospital Stay (HOSPITAL_COMMUNITY): Payer: Medicare Other

## 2019-09-19 DIAGNOSIS — E870 Hyperosmolality and hypernatremia: Secondary | ICD-10-CM

## 2019-09-19 DIAGNOSIS — J181 Lobar pneumonia, unspecified organism: Secondary | ICD-10-CM

## 2019-09-19 DIAGNOSIS — D649 Anemia, unspecified: Secondary | ICD-10-CM

## 2019-09-19 DIAGNOSIS — M7989 Other specified soft tissue disorders: Secondary | ICD-10-CM

## 2019-09-19 LAB — RENAL FUNCTION PANEL
Albumin: 2.1 g/dL — ABNORMAL LOW (ref 3.5–5.0)
Anion gap: 12 (ref 5–15)
BUN: 88 mg/dL — ABNORMAL HIGH (ref 8–23)
CO2: 29 mmol/L (ref 22–32)
Calcium: 7.8 mg/dL — ABNORMAL LOW (ref 8.9–10.3)
Chloride: 109 mmol/L (ref 98–111)
Creatinine, Ser: 5.09 mg/dL — ABNORMAL HIGH (ref 0.61–1.24)
GFR calc Af Amer: 13 mL/min — ABNORMAL LOW (ref >60)
GFR calc non Af Amer: 11 mL/min — ABNORMAL LOW (ref >60)
Glucose, Bld: 212 mg/dL — ABNORMAL HIGH (ref 70–99)
Phosphorus: 4.1 mg/dL (ref 2.5–4.6)
Potassium: 3.5 mmol/L (ref 3.5–5.1)
Sodium: 150 mmol/L — ABNORMAL HIGH (ref 135–145)

## 2019-09-19 LAB — CBC WITH DIFFERENTIAL/PLATELET
Abs Immature Granulocytes: 0.49 10*3/uL — ABNORMAL HIGH (ref 0.00–0.07)
Basophils Absolute: 0 10*3/uL (ref 0.0–0.1)
Basophils Relative: 0 %
Eosinophils Absolute: 0 10*3/uL (ref 0.0–0.5)
Eosinophils Relative: 0 %
HCT: 30.4 % — ABNORMAL LOW (ref 39.0–52.0)
Hemoglobin: 9.6 g/dL — ABNORMAL LOW (ref 13.0–17.0)
Immature Granulocytes: 5 %
Lymphocytes Relative: 9 %
Lymphs Abs: 0.9 10*3/uL (ref 0.7–4.0)
MCH: 29.9 pg (ref 26.0–34.0)
MCHC: 31.6 g/dL (ref 30.0–36.0)
MCV: 94.7 fL (ref 80.0–100.0)
Monocytes Absolute: 0.5 10*3/uL (ref 0.1–1.0)
Monocytes Relative: 5 %
Neutro Abs: 8.4 10*3/uL — ABNORMAL HIGH (ref 1.7–7.7)
Neutrophils Relative %: 81 %
Platelets: 146 10*3/uL — ABNORMAL LOW (ref 150–400)
RBC: 3.21 MIL/uL — ABNORMAL LOW (ref 4.22–5.81)
RDW: 13.2 % (ref 11.5–15.5)
WBC: 10.4 10*3/uL (ref 4.0–10.5)
nRBC: 0.6 % — ABNORMAL HIGH (ref 0.0–0.2)

## 2019-09-19 LAB — GLUCOSE, CAPILLARY
Glucose-Capillary: 163 mg/dL — ABNORMAL HIGH (ref 70–99)
Glucose-Capillary: 194 mg/dL — ABNORMAL HIGH (ref 70–99)
Glucose-Capillary: 196 mg/dL — ABNORMAL HIGH (ref 70–99)
Glucose-Capillary: 206 mg/dL — ABNORMAL HIGH (ref 70–99)
Glucose-Capillary: 223 mg/dL — ABNORMAL HIGH (ref 70–99)
Glucose-Capillary: 247 mg/dL — ABNORMAL HIGH (ref 70–99)
Glucose-Capillary: 263 mg/dL — ABNORMAL HIGH (ref 70–99)

## 2019-09-19 LAB — MAGNESIUM: Magnesium: 1.7 mg/dL (ref 1.7–2.4)

## 2019-09-19 LAB — CK: Total CK: 298 U/L (ref 49–397)

## 2019-09-19 MED ORDER — HYDROCORTISONE NA SUCCINATE PF 100 MG IJ SOLR
50.0000 mg | Freq: Every day | INTRAMUSCULAR | Status: AC
  Administered 2019-09-21: 50 mg via INTRAVENOUS
  Filled 2019-09-19: qty 2

## 2019-09-19 MED ORDER — HYDROCORTISONE NA SUCCINATE PF 100 MG IJ SOLR
50.0000 mg | Freq: Two times a day (BID) | INTRAMUSCULAR | Status: AC
  Administered 2019-09-20 (×2): 50 mg via INTRAVENOUS
  Filled 2019-09-19: qty 2

## 2019-09-19 MED ORDER — PANTOPRAZOLE SODIUM 40 MG PO TBEC
40.0000 mg | DELAYED_RELEASE_TABLET | Freq: Every day | ORAL | Status: DC
  Administered 2019-09-19: 21:00:00 40 mg via ORAL
  Filled 2019-09-19: qty 1

## 2019-09-19 MED ORDER — ATORVASTATIN CALCIUM 40 MG PO TABS
40.0000 mg | ORAL_TABLET | Freq: Every day | ORAL | Status: DC
  Administered 2019-09-19: 40 mg via ORAL
  Filled 2019-09-19: qty 1

## 2019-09-19 MED ORDER — HYDROCORTISONE NA SUCCINATE PF 100 MG IJ SOLR
50.0000 mg | Freq: Three times a day (TID) | INTRAMUSCULAR | Status: AC
  Administered 2019-09-19 (×2): 50 mg via INTRAVENOUS
  Filled 2019-09-19 (×2): qty 2
  Filled 2019-09-19: qty 1

## 2019-09-19 MED ORDER — RESOURCE THICKENUP CLEAR PO POWD
ORAL | Status: DC | PRN
  Filled 2019-09-19: qty 125

## 2019-09-19 NOTE — Evaluation (Signed)
Clinical/Bedside Swallow Evaluation Patient Details  Name: Ryan Villa MRN: [id number] Date of Birth: 1956/03/11  Today's Date: 09/19/2019 Time: SLP Start Time (ACUTE ONLY): [id number] SLP Stop Time (ACUTE ONLY): 0945 SLP Time Calculation (min) (ACUTE ONLY): 17 min  Past Medical History: No past medical history on file. Past Surgical History:  The histories are not reviewed yet. Please review them in the "History" navigator section and refresh this [place]. HPI:  63 year old male transferred from [facility] for acute respiratory failure, rhabdomyolysis (fell and laid on floor 3 days), acute kidney injury. PMH: CKD stage IV, diabetes mellitus type 2, hyperlipidemia, hypertension, left carotid stenosis, intracerebral hemorrhage, status post right BKA. Per chart EMS reported that the patient's sister was bringing him food while he was lying on the floor during that 3-day period. Intubated 12/23-12/24 (self extubated). Found to have right humeral neck fracture and right upper lobe consolidation per chest x-ray, head CT no acute intracranial process,    Assessment / Plan / Recommendation Clinical Impression  Per nursing pt coughing on liquids consistently. Dysphagia likely result of intubation (shorter term) and overall deconditioning since time of fall. Immediate cough following approximately 2 oz water (1 oz at a time consecutive) indicative of suspected penetration/aspiration. Pharyngeal residue likely as applesauce residue seen mixed with his secretions during oral suctionng. He consumed 4 oz nectar over period without overt evidence of ariway intrusion from subjective observation as well as puree. He needs instrumental assessment however recommending he initiate Dys 1 (puree- missing most dentition), nectar thick liquids, pills whole in puree and full supervision with plans for MBS when able.     SLP Visit Diagnosis: Dysphagia, unspecified (R13.10)    Aspiration Risk  Moderate aspiration risk     Diet Recommendation Dysphagia 1 (Puree);Nectar-thick liquid   Liquid Administration via: Cup;No straw Medication Administration: Whole meds with puree Supervision: Patient able to self feed;Full supervision/cueing for compensatory strategies Compensations: Slow rate;Small sips/bites Postural Changes: Seated upright at 90 degrees    Other  Recommendations Oral Care Recommendations: Oral care BID Other Recommendations: Order thickener from pharmacy   Follow up Recommendations Other (comment)(TBD)      Frequency and Duration            Prognosis        Swallow Study   General HPI: 63 year old male transferred from [facility] for acute respiratory failure, rhabdomyolysis (fell and laid on floor 3 days), acute kidney injury. PMH: CKD stage IV, diabetes mellitus type 2, hyperlipidemia, hypertension, left carotid stenosis, intracerebral hemorrhage, status post right BKA. Per chart EMS reported that the patient's sister was bringing him food while he was lying on the floor during that 3-day period. Intubated 12/23-12/24 (self extubated). Found to have right humeral neck fracture and right upper lobe consolidation per chest x-ray, head CT no acute intracranial process,  Type of Study: Bedside Swallow Evaluation Previous Swallow Assessment: (none) Diet Prior to this Study: NPO Temperature Spikes Noted: No Respiratory Status: Nasal cannula History of Recent Intubation: Yes Length of Intubations (days): 3 days Date extubated: 09/17/19 Behavior/Cognition: Alert;Cooperative;Requires cueing Oral Cavity Assessment: Within Functional Limits Oral Care Completed by SLP: Yes Oral Cavity - Dentition: Poor condition;Missing dentition Vision: Functional for self-feeding Self-Feeding Abilities: Needs assist Patient Positioning: Upright in bed Baseline Vocal Quality: Normal Volitional Cough: Strong Volitional Swallow: Able to elicit    Oral/Motor/Sensory Function Overall Oral  Motor/Sensory Function: Within functional limits   Ice Chips Ice chips: Not tested   Thin Liquid Thin Liquid: Impaired Presentation: Cup  Oral Phase Impairments: (none) Oral Phase Functional Implications: (none) Pharyngeal  Phase Impairments: Cough - Immediate;Throat Clearing - Delayed    Nectar Thick Nectar Thick Liquid: Within functional limits Presentation: Cup   Honey Thick Honey Thick Liquid: Not tested   Puree Puree: Within functional limits   Solid     Solid: Not tested      Houston Siren 09/19/2019,10:32 AM   Orbie Pyo Colvin Caroli.Ed Risk analyst [phone] Office [phone]

## 2019-09-19 NOTE — Progress Notes (Signed)
ICU Transitions of Care Pharmacist Intervention    Ryan Villa is a 63 y.o. male admitted on 09/15/2019  from outside hospital, and has been in the intensive care unit for 2d 19h.  Medical History: No past medical history on file.   Home Medication List:  Medications Prior to Admission  Medication Sig Dispense Refill Last Dose  . amLODipine (NORVASC) 10 MG tablet Take 10 mg by mouth.   UNK  . atorvastatin (LIPITOR) 40 MG tablet Take 40 mg by mouth daily.   UNK  . carvedilol (COREG) 6.25 MG tablet Take 6.25 mg by mouth.   UNK at [facility]  . clopidogrel (PLAVIX) 75 MG tablet Take 75 mg by mouth daily.   UNK  . ezetimibe (ZETIA) 10 MG tablet Take 10 mg by mouth daily.   UNK  . hydrALAZINE (APRESOLINE) 50 MG tablet Take 50 mg by mouth 2 (two) times daily.   UNK  . pantoprazole (PROTONIX) 40 MG tablet Take 40 mg by mouth daily.   UNK  . sodium bicarbonate 650 MG tablet Take 650 mg by mouth 3 (three) times daily.  5 UNK     Has a pharmacist made an intervention on this patient's medication list?  yes, because the patient stayed in medical-surgical ICU for > 48 hours, is discharging from ICU to another level of care within [facility], and has active orders for medications that should be discontinued upon transition out of ICU.   Transitions of care were discussed with CCM/Dr Erlinda Hong and the following interventions were made:  Stress Ulcer Prophylaxis:  A medication for stress ulcer prophylaxis was started during ICU admission and not intended to be continued at discharge. pantoprazole was changed from IV to PO. Patient in on PTA  Steroids:  A steroid was started during ICU admission and not intended to be continued at discharge. a taper was entered  Ryan Villa, PharmD, BCPS, BCCCP Clinical Pharmacist [phone]  Please check AMION for all [place] numbers  09/19/2019 10:38 AM

## 2019-09-19 NOTE — Progress Notes (Signed)
eLink Physician-Brief Progress Note Patient Name: Ryan Villa DOB: [birth year] MRN: [id number]   Date of Service  09/19/2019  HPI/Events of Note  Pt requires soft upper extremity restraints for safety- he is pulling on his lines  eICU Interventions  Soft bilateral wrist restraints orderd        Frederik Pear 09/19/2019, 3:11 AM

## 2019-09-19 NOTE — Progress Notes (Signed)
Right upper extremity venous duplex completed. Refer to "CV Proc" under chart review to view preliminary results.  09/19/2019 10:00 AM Kelby Aline., MHA, RVT, RDCS, RDMS

## 2019-09-19 NOTE — Progress Notes (Signed)
Subjective:   No overnight events 2.9L UOP!, SCr down to 5.1, SNa down to 150 on D5W _0 /h Pt w/o c/o, no SOB, no sig edema CK normalized  Objective Vital signs in last 24 hours: Vitals:   09/18/19 2345 09/19/19 0003 09/19/19 0123 09/19/19 0630  BP: (!) 174/91 (!) 164/96 (!) 164/98 (!) 190/95  Pulse: 71 69 70   Resp: _1 Temp:      TempSrc:      SpO2: 93% 96% 95%   Weight:      Height:       Weight change:   Intake/Output Summary (Last 24 hours) at 09/19/2019 6803 Last data filed at 09/19/2019 2122 Gross per 24 hour  Intake 2113.63 ml  Output 2850 ml  Net -736.37 ml    Assessment/Plan: 63 year old WM with DM, HTN and vascualr dz s/p BKA- unclear level of functioning or level of renal function prior to admit 1.Renal- A on CRF vs AKI in the setting of mild rhabdo after fall and prolonged time in one position.  SCr was around 4 in 2018 in care everywhere.  So might b e nearing a baseline.  Cont hydration as things are improving.   2. Hypertension/volume  - normally hypertensive on BP meds. Cont to monitor on CCB 3. Hyperkalmia-  resolved.  4. Anemia  -  s/p blood transfusion -   iron stores OK  on ESA. Hb 9.6 this AM 5.  Hypernatremia-  Cont D5W for another 24h. Improving.   Rexene Agent    Labs: Basic Metabolic Panel: Recent Labs  Lab 09/17/19 0317 09/17/19 1309 09/17/19 2353 09/18/19 0353 09/19/19 0630  NA 156* 154* 152* 155* 150*  K 3.4* 3.5 3.4* 3.4* 3.5  CL 110 113*  --  113* 109  CO2 31 30  --  31 29  GLUCOSE 243* 174*  --  157* 212*  BUN 127* 116*  --  105* 88*  CREATININE 6.98* 6.44*  --  5.87* 5.09*  CALCIUM 6.9* 6.6*  --  7.0* 7.8*  PHOS 5.1*  --   --  4.7* 4.1   Liver Function Tests: Recent Labs  Lab 09/16/19 0345 09/17/19 0317 09/18/19 0353 09/19/19 0630  AST 30  --   --   --   ALT 21  --   --   --   ALKPHOS 46  --   --   --   BILITOT 0.9  --   --   --   PROT 4.3*  --   --   --   ALBUMIN 2.0* 2.0* 2.0* 2.1*   No results for  input(s): LIPASE, AMYLASE in the last 168 hours. No results for input(s): AMMONIA in the last 168 hours. CBC: Recent Labs  Lab 09/15/19 1822 09/16/19 0345 09/17/19 0317 09/17/19 2353 09/18/19 0353 09/19/19 0630  WBC 4.6 5.8 6.4  --  10.4 10.4  NEUTROABS 3.9  --   --   --  8.6* 8.4*  HGB 6.8* 7.2* 7.4* 7.5* 8.3* 9.6*  HCT 21.3* 22.0* 22.1* 22.0* 25.6* 30.4*  MCV 94.2 92.4 92.1  --  94.5 94.7  PLT 190 175 168  --  150 146*   Cardiac Enzymes: Recent Labs  Lab 09/15/19 1822 09/16/19 0345 09/19/19 0630  CKTOTAL 1,452* 945* 298   CBG: Recent Labs  Lab 09/18/19 1558 09/18/19 1954 09/18/19 2357 09/19/19 0425 09/19/19 0750  GLUCAP 187* 225* 196* 223* 163*    Iron Studies:  Recent  Labs    09/18/19 0353  IRON 46  TIBC 123*  FERRITIN 258   Studies/Results: AM DG Chest Port 1 View  Result Date: 09/19/2019 CLINICAL DATA:  Respiratory failure. EXAM: PORTABLE CHEST 1 VIEW COMPARISON:  09/18/2019 FINDINGS: Right IJ central venous catheter unchanged with tip over the SVC. Patient is rotated to the right. Lungs are hypoinflated with minimal stable hazy prominence in the right infrahilar region and suprahilar region unchanged. No effusion. Cardiomediastinal silhouette and remainder the exam to include an old displaced right humeral neck fracture is unchanged. IMPRESSION: 1. Stable minimal hazy density over the right infrahilar and suprahilar regions likely infection. 2.  Right IJ central venous catheter unchanged. Electronically Signed   By: Marin Olp M.D.   On: 09/19/2019 07:53   AM DG Chest Port 1 View  Result Date: 09/18/2019 CLINICAL DATA:  63 year old male with respiratory failure. Multilobar pneumonia on recent CT. Negative for COVID-19. EXAM: PORTABLE CHEST 1 VIEW COMPARISON:  09/17/2019 and earlier. FINDINGS: Portable AP semi upright view at 0544 hours. Extubated and enteric tube removed. Stable right IJ central line. Slightly lower lung volumes. Stable cardiac size and  mediastinal contours. Multifocal streaky perihilar opacity greater in the right lung is stable to mildly improved since yesterday. No pneumothorax or pulmonary edema. No pleural effusion is evident. Paucity of bowel gas in the upper abdomen. IMPRESSION: 1. Extubated and enteric tube removed.  Slightly lower lung volumes. 2. Bilateral perihilar pneumonia appear stable to slightly improved from yesterday. 3. No new cardiopulmonary abnormality. Electronically Signed   By: Genevie Ann M.D.   On: 09/18/2019 08:25   DG Abd Portable 1V  Result Date: 09/18/2019 CLINICAL DATA:  63 year old male status post nasogastric tube placement EXAM: PORTABLE ABDOMEN - 1 VIEW COMPARISON:  Multiple prior abdominal radiographs earlier today FINDINGS: A gastric tube is been placed. The tip of the tube overlies the descending duodenum. Mild gaseous dilation of loops of small bowel in the left mid abdomen. Moderate rectal stool ball overlies the anatomic pelvis consistent with constipation. IMPRESSION: 1. The tip of the gastric tube overlies the descending duodenum. If the intent is to decompress the stomach, recommend withdrawing 10 cm for optimal placement. 2. Multiple loops of mildly dilated and gas-filled small bowel present in the left mid abdomen. 3. Moderately large rectal stool ball suggests underlying constipation. Electronically Signed   By: Jacqulynn Cadet M.D.   On: 09/18/2019 14:53   DG Abd Portable 1V  Result Date: 09/18/2019 CLINICAL DATA:  Nasogastric tube placement EXAM: PORTABLE ABDOMEN - 1 VIEW COMPARISON:  Portable exam 1424 hours compared to 1415 hours FINDINGS: Nasogastric tube projects over RIGHT lower lobe bronchus, recommend removal and replacement. Nonobstructive bowel gas pattern. Bones appear demineralized. Minimal atelectasis or infiltrate at lung bases. IMPRESSION: Nasogastric tube extends into RIGHT lower lobe; recommend removal and replacement. Emergent results were called by telephone at the time of  interpretation on 09/18/2019 at 2:36 pm to [facility] on [address] ([facility]), who verbally acknowledged these results. Electronically Signed   By: Lavonia Dana M.D.   On: 09/18/2019 14:37   DG Abd Portable 1V  Result Date: 09/18/2019 CLINICAL DATA:  Nasogastric tube repositioning at patient bedside. EXAM: PORTABLE ABDOMEN - 1 VIEW 2:15 p.m.: COMPARISON:  Portable abdomen x-ray earlier same day at 2:06 p.m. and previously. FINDINGS: The nasogastric tube remains looped in the proximal stomach with its tip back up in the esophagus. Stable bowel gas pattern as detailed on the earlier examination at 2:06 p.m.  IMPRESSION: 1. Nasogastric tube remains looped in the proximal stomach with its tip back up in the esophagus. I recommend withdrawing the tube several centimeters and then re-advancing. 2. Stable bowel gas pattern as detailed on the earlier examination. These results will be called to the ordering clinician or representative by the Radiologist Assistant, and communication documented in the PACS or zVision Dashboard. Electronically Signed   By: Evangeline Dakin M.D.   On: 09/18/2019 14:27   DG Abd Portable 1V  Result Date: 09/18/2019 CLINICAL DATA:  Bedside nasogastric tube placement. EXAM: PORTABLE ABDOMEN - 1 VIEW COMPARISON:  09/15/2019. FINDINGS: The nasogastric tube is looped in the proximal stomach and extends back up into the esophagus. Mild dilation of jejunal loops in the LEFT UPPER QUADRANT, with more dilated loops on the current examination. IMPRESSION: 1. Nasogastric tube is looped in the proximal stomach and extends back up into the esophagus. I recommend withdrawing the tube several centimeters and re-advancing. 2. Mild dilation of jejunal loops in the LEFT UPPER quadrant, more so than on the examination 3 days ago, query localized ileus or early partial small bowel obstruction. These results will be called to the ordering clinician or representative by the Radiologist Assistant, and communication  documented in the PACS or zVision Dashboard. Electronically Signed   By: Evangeline Dakin M.D.   On: 09/18/2019 14:22   Medications: Infusions: . sodium chloride 10 mL/hr at 09/19/19 0200  . cefTRIAXone (ROCEPHIN)  IV Stopped (09/18/19 1759)  . dexmedetomidine (PRECEDEX) IV infusion Stopped (09/17/19 0738)  . dextrose 100 mL/hr at 09/19/19 0426    Scheduled Medications: . sodium chloride   Intravenous Once  . amLODipine  10 mg Oral Daily  . chlorhexidine gluconate (MEDLINE KIT)  15 mL Mouth Rinse BID  . Chlorhexidine Gluconate Cloth  6 each Topical Daily  . darbepoetin (ARANESP) injection - NON-DIALYSIS  100 mcg Subcutaneous Q Thu-1800  . free water  300 mL Per Tube Q6H  . heparin  5,000 Units Subcutaneous Q8H  . hydrocortisone sodium succinate  50 mg Intravenous Q6H  . insulin aspart  0-9 Units Subcutaneous Q4H  . mouth rinse  15 mL Mouth Rinse 10 times per day  . pantoprazole (PROTONIX) IV  40 mg Intravenous QHS    have reviewed scheduled and prn medications.  Physical Exam: General: extubated -  Appears alert, interactive this AMHeart: RRR Lungs: clear Abdomen: soft, non tender Extremities: no peripheral edema-  Right arm bruised   09/19/2019,9:18 AM  LOS: 4 days

## 2019-09-19 NOTE — Progress Notes (Addendum)
PROGRESS NOTE  Ryan Villa ONG:[id number] DOB: 03/08/1956 DOA: 09/15/2019 PCP: Maryella Shivers, MD  Brief summary:  This is a 63 year old male past medical history CKD stage IV, diabetes mellitus type 2, hyperlipidemia, hypertension, left carotid stenosis, intracerebral hemorrhage, status post right BKA presented to [facility] emergency department after apparently falling at his home 3 days ago, apparently lying on the floor since that time.  EMS reported that the patient's sister was bringing him food while he was lying on the floor during that 3-day period. He is status post recent right BKA and uses a prosthetic and is supposed to use a walker but is noncompliant with his per family.  Reportedly not using his walker when he fell and landed on his right side.  Unclear if there was loss of consciousness.  At that time the patient did not want his sister to call 911.  On 12/23 she called 911 because she noticed that he was having a hard time breathing and bruising on his right shoulder.  On arrival to [facility] ED the patient was able to answer questions with difficulty in understanding his speech.  He was hypoxic on room air and placed on nonrebreather, noted to appear severely ill appearing and tachypneic.  He was also noted to be covered in dried stool/urine.  He complained of right shoulder pain.  Work-up and findings significant for hypothermia with a temperature of 93.9 F, significant ecchymosis over the right shoulder area, hypotensive, given IV fluids.  He progressed to have worsening respiratory distress, hypotension, became unresponsive and subsequently intubated, central line placed.  He had significant metabolic derangements including sodium 149, potassium 6.8, BUN 172, creatinine 10.5.  CK 2411. Procalcitonin 0.61.  UDS negative.  pH less than 7 per ABG report.  He responded fairly well to IV fluids and briefly required pressor support.  Repeat labs showed minimal improvement in metabolic  derangements.  He had some urine output, milky white in color.  Noted right humeral neck fracture on x-ray per ER paper chart, right upper lobe consolidation per chest x-ray, head CT no acute intracranial process, Diffuse parasinus inflammation.  Per critical care attending Dr. Nelda Marseille patient was tested negative for Covid at [facility].  HPI/Recap of past 24 hours:  He pulled out his NG tube, RN reports he did ok with apple sauce but coughs after sips of liquid He is awake, on 4liter oxygen supplement, he denies pain, he knows he is in the hospital, states he is at [facility]" , he is not oriented to time  Assessment/Plan: Principal Problem:   Closed fracture of right proximal humerus Active Problems:   Acute respiratory failure (HCC)   AKI (acute kidney injury) ([place])   Rhabdomyolysis   Acute renal failure (ARF) ([place])   Pressure injury of skin   Protein-calorie malnutrition, severe  Acute respiratory failure with hypoxia on presentation to [facility] -He was intubated and transferred to [facility] ICU -Patient self extubated on December 25, he remains on room air no hypoxia at rest. -He does have a cough when taking sips of water post extubation, will get speech eval, aspiration precaution  Lobar pneumonia, right -Per Dr. Nelda Marseille, patient was tested negative for Covid at [facility] -No sputum culture obtained -Blood culture negative -He did spike fever 101.1 on December 25, no fever last 24 hours -He has been treated with Rocephin and Zithromax since admission, today is day 4 of antibiotic treatment  UTI -Urine culture on admission is positive for E. Coli, sensitive  to Rocephin -He is treated with Rocephin since admission   Hypernatremia -On D5 infusion, improving -Appreciate nephrology input  AKI on CKD -Unknown baseline -BUN 170 creatinine 9.68 on presentation, improving, BUN 88 creatinine 5.09 today -2.8 L urine output last 24 hours -Nephrology following, input  appreciated  Rhabdomyolysis -Resolved  Normocytic anemia -tibili unremarkable, he does not Appear to have any overt blood loss, likely underlying anemia of chronic disease -Status post 1 piece RBC transfusion on December 23, getting EPO injection prerenal -monitor hgb, will check FOBt  Hypertension Norvasc  Displaced right surgical neck humerus fracture ortho Dr Erlinda Hong recommended leave in shoulder sling, will eventually need shoulder replacement when medically stable  Right hand edema -Elevate right upper extremity, venous Doppler to rule out DVT  History of right BKA, stable  Elevated blood glucose -I do not see any diabetes medication on his home med rec list -A1c obtained on December 23  is 5.4, however A1c test is not reliable in the setting of AKI and anemia -will repeat A1c -Taper stress dose steroid(Patient has been on stress dose steroids since admission) -Continue SSI  Acute Metabolic encephalopathy/delirium -Initial CT head no acute findings, multiple remote lacunar strokes seen CT scan -He does have metabolic derangement which is improving -Frequent reorientation -Transfer out of ICU  H/o CVA: Will check lipid panel, restart lipitor Restart plavix if fobt negative  DVT Prophylaxis: Heparin  Code Status: Full  Family Communication: patient   Disposition Plan: Transfer out of ICU to stepdown unit   Consultants:  Ortho Dr Erlinda Hong  Nephrology   Procedures:  Intubation at [facility] ED, patient self extubated on December 25  NG placement, and pulled out NG  Right IJ placement on December 23  Antibiotics:  Rocephin and Zithromax since admission   Objective: BP (!) 190/95   Pulse 70   Temp 98.6 F (37 C)   Resp 19   Ht _0  (1.727 m)   Wt 69.7 kg   SpO2 95%   BMI 23.36 kg/m   Intake/Output Summary (Last 24 hours) at 09/19/2019 0347 Last data filed at 09/19/2019 4259 Gross per 24 hour  Intake 2113.63 ml  Output 2850 ml  Net -736.37 ml    Filed Weights   09/16/19 1124 09/18/19 0500  Weight: 68.5 kg 69.7 kg    Exam: Patient is examined daily including today on 09/19/2019, exams remain the same as of yesterday except that has changed    General:  Confused, does follow commands,   Cardiovascular: RRR  Respiratory: CTABL  Abdomen: Soft/ND/NT, positive BS  Musculoskeletal: right shoulder significant ecchymosis, right hand edematous.  History of right BKA  Neuro: alert, oriented to self, not to time, know he is in the hospital but states he is at [facility],   Data Reviewed: Basic Metabolic Panel: Recent Labs  Lab 09/15/19 1822 09/16/19 0345 09/16/19 0354 09/16/19 1743 09/17/19 0317 09/17/19 0347 09/17/19 1309 09/17/19 2353 09/18/19 0353 09/19/19 0630  NA 151* 153*  --  154* 156* 153* 154* 152* 155* 150*  K 5.8* 4.6  --  3.5 3.4* 3.2* 3.5 3.4* 3.4* 3.5  CL 122* 121*   < > 116* 110  --  113*  --  113* 109  CO2 12* 15*   < > 25 31  --  30  --  31 29  GLUCOSE 211* 213*   < > 296* 243*  --  174*  --  157* 212*  BUN 170* 162*   < > 144*  127*  --  116*  --  105* 88*  CREATININE 9.68* 8.66*   < > 7.66* 6.98*  --  6.44*  --  5.87* 5.09*  CALCIUM 7.0* 7.0*   < > 7.0* 6.9*  --  6.6*  --  7.0* 7.8*  MG 2.1 2.1  --   --  1.8  --   --   --  1.7 1.7  PHOS 10.4* 7.2*  --   --  5.1*  --   --   --  4.7* 4.1   < > = values in this interval not displayed.   Liver Function Tests: Recent Labs  Lab 09/16/19 0345 09/17/19 0317 09/18/19 0353 09/19/19 0630  AST 30  --   --   --   ALT 21  --   --   --   ALKPHOS 46  --   --   --   BILITOT 0.9  --   --   --   PROT 4.3*  --   --   --   ALBUMIN 2.0* 2.0* 2.0* 2.1*   No results for input(s): LIPASE, AMYLASE in the last 168 hours. No results for input(s): AMMONIA in the last 168 hours. CBC: Recent Labs  Lab 09/15/19 1822 09/16/19 0345 09/17/19 0317 09/17/19 0347 09/17/19 2353 09/18/19 0353 09/19/19 0630  WBC 4.6 5.8 6.4  --   --  10.4 10.4  NEUTROABS 3.9  --   --    --   --  8.6* 8.4*  HGB 6.8* 7.2* 7.4* 6.8* 7.5* 8.3* 9.6*  HCT 21.3* 22.0* 22.1* 20.0* 22.0* 25.6* 30.4*  MCV 94.2 92.4 92.1  --   --  94.5 94.7  PLT 190 175 168  --   --  150 146*   Cardiac Enzymes:   Recent Labs  Lab 09/15/19 1822 09/16/19 0345 09/19/19 0630  CKTOTAL 1,452* 945* 298   BNP (last 3 results) Recent Labs    09/15/19 1822  BNP 61.3    ProBNP (last 3 results) No results for input(s): PROBNP in the last 8760 hours.  CBG: Recent Labs  Lab 09/18/19 1558 09/18/19 1954 09/18/19 2357 09/19/19 0425 09/19/19 0750  GLUCAP 187* 225* 196* 223* 163*    Recent Results (from the past 240 hour(s))  MRSA PCR Screening     Status: Abnormal   Collection Time: 09/15/19  4:41 PM   Specimen: Nasal Mucosa; Nasopharyngeal  Result Value Ref Range Status   MRSA by PCR POSITIVE (A) NEGATIVE Final    Comment:        The GeneXpert MRSA Assay (FDA approved for NASAL specimens only), is one component of a comprehensive MRSA colonization surveillance program. It is not intended to diagnose MRSA infection nor to guide or monitor treatment for MRSA infections. RESULT CALLED TO, READ BACK BY AND VERIFIED WITH: D [facility] RN 09/15/19 2120 JDW Performed at [facility] [address] [place], [place] [address]   Urine culture     Status: Abnormal   Collection Time: 09/15/19  4:47 PM   Specimen: Urine, Random  Result Value Ref Range Status   Specimen Description URINE, RANDOM  Final   Special Requests   Final    NONE Performed at [facility], [place] [address] [place], [place] [address]    Culture 80,000 COLONIES/mL ESCHERICHIA COLI (A)  Final   Report Status 09/18/2019 FINAL  Final   Organism ID, Bacteria ESCHERICHIA COLI (A)  Final  Susceptibility   Escherichia coli - MIC*    AMPICILLIN >=32 RESISTANT Resistant     CEFAZOLIN 8 SENSITIVE Sensitive     CEFTRIAXONE <=1 SENSITIVE Sensitive     CIPROFLOXACIN <=0.25 SENSITIVE Sensitive     GENTAMICIN <=1  SENSITIVE Sensitive     IMIPENEM <=0.25 SENSITIVE Sensitive     NITROFURANTOIN <=16 SENSITIVE Sensitive     TRIMETH/SULFA <=20 SENSITIVE Sensitive     AMPICILLIN/SULBACTAM 16 INTERMEDIATE Intermediate     PIP/TAZO 8 SENSITIVE Sensitive     * 80,000 COLONIES/mL ESCHERICHIA COLI  Culture, blood (routine x 2)     Status: None (Preliminary result)   Collection Time: 09/15/19  6:12 PM   Specimen: BLOOD LEFT FOREARM  Result Value Ref Range Status   Specimen Description BLOOD LEFT FOREARM  Final   Special Requests   Final    BOTTLES DRAWN AEROBIC AND ANAEROBIC Blood Culture results may not be optimal due to an inadequate volume of blood received in culture bottles   Culture   Final    NO GROWTH 4 DAYS Performed at [facility], [place] [address], [place], [place] [address]    Report Status PENDING  Incomplete  Culture, blood (routine x 2)     Status: None (Preliminary result)   Collection Time: 09/15/19  6:17 PM   Specimen: BLOOD LEFT HAND  Result Value Ref Range Status   Specimen Description BLOOD LEFT HAND  Final   Special Requests   Final    BOTTLES DRAWN AEROBIC AND ANAEROBIC Blood Culture results may not be optimal due to an inadequate volume of blood received in culture bottles   Culture   Final    NO GROWTH 4 DAYS Performed at [facility], [place] [address], [place], [place] [address]    Report Status PENDING  Incomplete     Studies: AM DG Chest Port 1 View  Result Date: 09/19/2019 CLINICAL DATA:  Respiratory failure. EXAM: PORTABLE CHEST 1 VIEW COMPARISON:  09/18/2019 FINDINGS: Right IJ central venous catheter unchanged with tip over the SVC. Patient is rotated to the right. Lungs are hypoinflated with minimal stable hazy prominence in the right infrahilar region and suprahilar region unchanged. No effusion. Cardiomediastinal silhouette and remainder the exam to include an old displaced right humeral neck fracture is unchanged. IMPRESSION: 1. Stable minimal hazy density  over the right infrahilar and suprahilar regions likely infection. 2.  Right IJ central venous catheter unchanged. Electronically Signed   By: Marin Olp M.D.   On: 09/19/2019 07:53   DG Abd Portable 1V  Result Date: 09/18/2019 CLINICAL DATA:  63 year old male status post nasogastric tube placement EXAM: PORTABLE ABDOMEN - 1 VIEW COMPARISON:  Multiple prior abdominal radiographs earlier today FINDINGS: A gastric tube is been placed. The tip of the tube overlies the descending duodenum. Mild gaseous dilation of loops of small bowel in the left mid abdomen. Moderate rectal stool ball overlies the anatomic pelvis consistent with constipation. IMPRESSION: 1. The tip of the gastric tube overlies the descending duodenum. If the intent is to decompress the stomach, recommend withdrawing 10 cm for optimal placement. 2. Multiple loops of mildly dilated and gas-filled small bowel present in the left mid abdomen. 3. Moderately large rectal stool ball suggests underlying constipation. Electronically Signed   By: Jacqulynn Cadet M.D.   On: 09/18/2019 14:53   DG Abd Portable 1V  Result Date: 09/18/2019 CLINICAL DATA:  Nasogastric tube placement EXAM: PORTABLE ABDOMEN - 1 VIEW COMPARISON:  Portable exam 1424 hours compared to 1415 hours FINDINGS: Nasogastric tube projects over RIGHT lower lobe bronchus, recommend removal and replacement. Nonobstructive bowel gas pattern. Bones appear demineralized. Minimal atelectasis or infiltrate at lung bases. IMPRESSION: Nasogastric tube extends into RIGHT lower lobe; recommend removal and replacement. Emergent results were called by telephone at the time of interpretation on 09/18/2019 at 2:36 pm to [facility] on [address] ([facility]), who verbally acknowledged these results. Electronically Signed   By: Lavonia Dana M.D.   On: 09/18/2019 14:37   DG Abd Portable 1V  Result Date: 09/18/2019 CLINICAL DATA:  Nasogastric tube repositioning at patient bedside. EXAM: PORTABLE ABDOMEN -  1 VIEW 2:15 p.m.: COMPARISON:  Portable abdomen x-ray earlier same day at 2:06 p.m. and previously. FINDINGS: The nasogastric tube remains looped in the proximal stomach with its tip back up in the esophagus. Stable bowel gas pattern as detailed on the earlier examination at 2:06 p.m. IMPRESSION: 1. Nasogastric tube remains looped in the proximal stomach with its tip back up in the esophagus. I recommend withdrawing the tube several centimeters and then re-advancing. 2. Stable bowel gas pattern as detailed on the earlier examination. These results will be called to the ordering clinician or representative by the Radiologist Assistant, and communication documented in the PACS or zVision Dashboard. Electronically Signed   By: Evangeline Dakin M.D.   On: 09/18/2019 14:27   DG Abd Portable 1V  Result Date: 09/18/2019 CLINICAL DATA:  Bedside nasogastric tube placement. EXAM: PORTABLE ABDOMEN - 1 VIEW COMPARISON:  09/15/2019. FINDINGS: The nasogastric tube is looped in the proximal stomach and extends back up into the esophagus. Mild dilation of jejunal loops in the LEFT UPPER QUADRANT, with more dilated loops on the current examination. IMPRESSION: 1. Nasogastric tube is looped in the proximal stomach and extends back up into the esophagus. I recommend withdrawing the tube several centimeters and re-advancing. 2. Mild dilation of jejunal loops in the LEFT UPPER quadrant, more so than on the examination 3 days ago, query localized ileus or early partial small bowel obstruction. These results will be called to the ordering clinician or representative by the Radiologist Assistant, and communication documented in the PACS or zVision Dashboard. Electronically Signed   By: Evangeline Dakin M.D.   On: 09/18/2019 14:22    Scheduled Meds: . sodium chloride   Intravenous Once  . amLODipine  10 mg Oral Daily  . chlorhexidine gluconate (MEDLINE KIT)  15 mL Mouth Rinse BID  . Chlorhexidine Gluconate Cloth  6 each Topical  Daily  . darbepoetin (ARANESP) injection - NON-DIALYSIS  100 mcg Subcutaneous Q Thu-1800  . free water  300 mL Per Tube Q6H  . heparin  5,000 Units Subcutaneous Q8H  . hydrocortisone sodium succinate  50 mg Intravenous Q6H  . insulin aspart  0-9 Units Subcutaneous Q4H  . mouth rinse  15 mL Mouth Rinse 10 times per day  . pantoprazole (PROTONIX) IV  40 mg Intravenous QHS    Continuous Infusions: . sodium chloride 10 mL/hr at 09/19/19 0200  . cefTRIAXone (ROCEPHIN)  IV Stopped (09/18/19 1759)  . dexmedetomidine (PRECEDEX) IV infusion Stopped (09/17/19 0738)  . dextrose 100 mL/hr at 09/19/19 0426     Time spent: 24mns I have personally reviewed and interpreted on  09/19/2019 daily labs, tele strips, imagings as discussed above under date review session and assessment and plans.  I reviewed all nursing notes, pharmacy notes, consultant notes,  vitals, pertinent old records  I have discussed plan of care  as described above with RN , patient  on 09/19/2019   Florencia Reasons MD, PhD, FACP  [facility]  [email or web address], password [email or web address] 09/19/2019, 8:21 AM  LOS: 4 days

## 2019-09-19 NOTE — Progress Notes (Signed)
NP Gleason at bedside. Will continue to monitor pt and place order for transfer on hold for now per NP. Pt npo d/t somnolence, will restart lactate ringers per NP order.

## 2019-09-20 ENCOUNTER — Inpatient Hospital Stay (HOSPITAL_COMMUNITY): Payer: Medicare Other

## 2019-09-20 LAB — LIPID PANEL
Cholesterol: 92 mg/dL (ref 0–200)
HDL: 30 mg/dL — ABNORMAL LOW (ref >40)
LDL Cholesterol: 39 mg/dL (ref 0–99)
Total CHOL/HDL Ratio: 3.1 RATIO
Triglycerides: 117 mg/dL (ref <150)
VLDL: 23 mg/dL (ref 0–40)

## 2019-09-20 LAB — GLUCOSE, CAPILLARY
Glucose-Capillary: 251 mg/dL — ABNORMAL HIGH (ref 70–99)
Glucose-Capillary: 185 mg/dL — ABNORMAL HIGH (ref 70–99)
Glucose-Capillary: 200 mg/dL — ABNORMAL HIGH (ref 70–99)
Glucose-Capillary: 205 mg/dL — ABNORMAL HIGH (ref 70–99)
Glucose-Capillary: 212 mg/dL — ABNORMAL HIGH (ref 70–99)
Glucose-Capillary: 210 mg/dL — ABNORMAL HIGH (ref 70–99)
Glucose-Capillary: 221 mg/dL — ABNORMAL HIGH (ref 70–99)

## 2019-09-20 LAB — CULTURE, BLOOD (ROUTINE X 2)
Culture: NO GROWTH
Culture: NO GROWTH

## 2019-09-20 LAB — HEPATIC FUNCTION PANEL
ALT: 17 U/L (ref 0–44)
AST: 17 U/L (ref 15–41)
Albumin: 1.9 g/dL — ABNORMAL LOW (ref 3.5–5.0)
Alkaline Phosphatase: 49 U/L (ref 38–126)
Bilirubin, Direct: <0.1 mg/dL (ref 0.0–0.2)
Total Bilirubin: 0.5 mg/dL (ref 0.3–1.2)
Total Protein: 4.4 g/dL — ABNORMAL LOW (ref 6.5–8.1)

## 2019-09-20 LAB — CBC WITH DIFFERENTIAL/PLATELET
Abs Immature Granulocytes: 0.33 10*3/uL — ABNORMAL HIGH (ref 0.00–0.07)
Basophils Absolute: 0 10*3/uL (ref 0.0–0.1)
Basophils Relative: 0 %
Eosinophils Absolute: 0 10*3/uL (ref 0.0–0.5)
Eosinophils Relative: 0 %
HCT: 27.2 % — ABNORMAL LOW (ref 39.0–52.0)
Hemoglobin: 8.6 g/dL — ABNORMAL LOW (ref 13.0–17.0)
Immature Granulocytes: 3 %
Lymphocytes Relative: 15 %
Lymphs Abs: 1.6 10*3/uL (ref 0.7–4.0)
MCH: 30.4 pg (ref 26.0–34.0)
MCHC: 31.6 g/dL (ref 30.0–36.0)
MCV: 96.1 fL (ref 80.0–100.0)
Monocytes Absolute: 0.6 10*3/uL (ref 0.1–1.0)
Monocytes Relative: 6 %
Neutro Abs: 8 10*3/uL — ABNORMAL HIGH (ref 1.7–7.7)
Neutrophils Relative %: 76 %
Platelets: 123 10*3/uL — ABNORMAL LOW (ref 150–400)
RBC: 2.83 MIL/uL — ABNORMAL LOW (ref 4.22–5.81)
RDW: 13.2 % (ref 11.5–15.5)
WBC: 10.5 10*3/uL (ref 4.0–10.5)
nRBC: 0.6 % — ABNORMAL HIGH (ref 0.0–0.2)

## 2019-09-20 LAB — RENAL FUNCTION PANEL
Albumin: 1.9 g/dL — ABNORMAL LOW (ref 3.5–5.0)
Anion gap: 13 (ref 5–15)
BUN: 89 mg/dL — ABNORMAL HIGH (ref 8–23)
CO2: 28 mmol/L (ref 22–32)
Calcium: 7.1 mg/dL — ABNORMAL LOW (ref 8.9–10.3)
Chloride: 104 mmol/L (ref 98–111)
Creatinine, Ser: 5.02 mg/dL — ABNORMAL HIGH (ref 0.61–1.24)
GFR calc Af Amer: 13 mL/min — ABNORMAL LOW (ref >60)
GFR calc non Af Amer: 11 mL/min — ABNORMAL LOW (ref >60)
Glucose, Bld: 250 mg/dL — ABNORMAL HIGH (ref 70–99)
Phosphorus: 3.7 mg/dL (ref 2.5–4.6)
Potassium: 3.2 mmol/L — ABNORMAL LOW (ref 3.5–5.1)
Sodium: 145 mmol/L (ref 135–145)

## 2019-09-20 LAB — HEMOGLOBIN A1C
Hgb A1c MFr Bld: 5.7 % — ABNORMAL HIGH (ref 4.8–5.6)
Mean Plasma Glucose: 116.89 mg/dL

## 2019-09-20 LAB — SARS CORONAVIRUS 2 (TAT 6-24 HRS): SARS Coronavirus 2: NEGATIVE

## 2019-09-20 MED ORDER — POTASSIUM CHLORIDE 20 MEQ/15ML (10%) PO SOLN
20.0000 meq | Freq: Once | ORAL | Status: AC
  Administered 2019-09-20: 14:00:00 20 meq via ORAL
  Filled 2019-09-20: qty 15

## 2019-09-20 MED ORDER — ACETAMINOPHEN 325 MG PO TABS
650.0000 mg | ORAL_TABLET | ORAL | Status: DC | PRN
  Filled 2019-09-20: qty 2

## 2019-09-20 MED ORDER — PANTOPRAZOLE SODIUM 40 MG PO PACK
40.0000 mg | PACK | Freq: Every day | ORAL | Status: DC
  Administered 2019-09-20: 22:00:00 40 mg
  Filled 2019-09-20 (×2): qty 20

## 2019-09-20 MED ORDER — RESOURCE THICKENUP CLEAR PO POWD
ORAL | Status: DC | PRN
  Filled 2019-09-20: qty 125

## 2019-09-20 MED ORDER — OSMOLITE 1.2 CAL PO LIQD
1000.0000 mL | ORAL | Status: DC
  Administered 2019-09-20 – 2019-09-24 (×5): 1000 mL
  Filled 2019-09-20 (×8): qty 1000

## 2019-09-20 MED ORDER — PRO-STAT SUGAR FREE PO LIQD
30.0000 mL | Freq: Three times a day (TID) | ORAL | Status: DC
  Administered 2019-09-20 – 2019-09-29 (×18): 30 mL
  Filled 2019-09-20 (×21): qty 30

## 2019-09-20 MED ORDER — JEVITY 1.2 CAL PO LIQD: 1000.0000 mL | ORAL | Status: DC

## 2019-09-20 MED ORDER — MUPIROCIN 2 % EX OINT
TOPICAL_OINTMENT | Freq: Two times a day (BID) | CUTANEOUS | Status: DC
  Administered 2019-09-23 – 2019-10-01 (×3): 1 via NASAL
  Filled 2019-09-20 (×6): qty 22

## 2019-09-20 MED ORDER — AMLODIPINE BESYLATE 10 MG PO TABS
10.0000 mg | ORAL_TABLET | Freq: Every day | ORAL | Status: DC
  Administered 2019-09-21 – 2019-09-30 (×9): 10 mg
  Filled 2019-09-20 (×10): qty 1

## 2019-09-20 MED ORDER — POTASSIUM CHLORIDE CRYS ER 20 MEQ PO TBCR: 20.0000 meq | EXTENDED_RELEASE_TABLET | Freq: Once | ORAL | Status: DC

## 2019-09-20 NOTE — Progress Notes (Signed)
Nutrition Follow-up  DOCUMENTATION CODES:   Severe malnutrition in context of acute illness/injury  INTERVENTION:  Initiate Osmolite 1.2 @ 30 ml/hr, advance 10 ml every 6 hrs to goal rate 60 ml/hr (1440 ml/day) Prostat 30 ml TID via tube  Provides 2028 kcal, 125 grams protein, and 1181 ml free water from formula  Monitor magnesium, potassium, and phosphorus levels, MD to replete as needed, as pt is at risk for refeeding syndrome given severe PCM with recent minimal intake.  NUTRITION DIAGNOSIS:   Severe Malnutrition related to acute illness(S/P fall at home with rhabdomyolysis after lying on the floor for 3 days) as evidenced by moderate fat depletion, moderate muscle depletion, severe muscle depletion, severe fat depletion.  Ongoing  GOAL:   Patient will meet greater than or equal to 90% of their needs  Progressing  MONITOR:   Vent status, Labs, Skin, TF tolerance, I & O's  REASON FOR ASSESSMENT:   Consult Enteral/tube feeding initiation and management  ASSESSMENT:   63 yo male admitted with respiratory failure, rhabdomyolysis, and AKI after fall at home 3 days PTA. He had been lying on the floor x 3 days since his fall. Found to have PNA, UTI, and right proximal humerus fracture. PMH includes CKD-IV, HLD, DM-2, HTN, L carotid stenosis, ICH, R BKA.  12/28 - Cortrak 12/25 - Self extubated 12/23 - Right IJ placement 12/23 - Intubated  Patient previously on dysphagia 1 diet, noted meal intake on 12/27 was 80-90%. Per SLP patient with moderate-severe dysphagia and did not pass MBS today. Cortrak has been placed for initiation of tube feedings. Per notes pt still confused and trying to get out of bed. Currently on 6L [place] and producing urine with stable renal function.  I/Os: +6256 ml since admit      +3509 ml x 24 hrs UOP: 505 ml x 24 hrs  Current wt 69.7 kg (153.3 lb)  Medications reviewed and include: SS novolog, Protonix, Rocephin  Labs: CBGs 185-251 x 24 hrs, K 3.2  (L), BUN 89 (H), Cr 5.02 (H), Albumin 1.9 (L), Corrected Ca 8.78 (L)  Diet Order:   Diet Order            Diet NPO time specified Except for: Sips with Meds  Diet effective now              EDUCATION NEEDS:   Not appropriate for education at this time  Skin:  Skin Assessment: Skin Integrity Issues: Skin Integrity Issues:: Stage II Stage II: R lower arm  Last BM:  12/26 (type 6)  Height:   Ht Readings from Last 1 Encounters:  09/15/19 5\' 8"  (1.727 m)    Weight:   Wt Readings from Last 1 Encounters:  09/18/19 69.7 kg    Ideal Body Weight:  65.5 kg(adjusted for BKA)  BMI:  Body mass index is 23.36 kg/m.  Estimated Nutritional Needs:   Kcal:  [id number]  Protein:  110-123  Fluid:  >/= 1.9 L/day   Lajuan Lines, RD, LDN Clinical Nutrition [place] Telephone [phone] After Hours/Weekend Pager: [phone]

## 2019-09-20 NOTE — Consult Note (Signed)
WOC Nurse Consult Note: Reason for Consult: Right upper extremity with two skin tears that are weeping, distal > proximal, arm is edematous and ecchymotic.  Negative for superficial and deep vein thrombosis. Wound also noted on right BKA. Wound type:trauma Pressure Injury POA: N/A Measurement: Right arm skin tear, Proximal:  1.5cm x 1cm x 0.1cm with bright red wound bed and small amount of fresh serosanguinous exudate upon dressing removal. Right arm skin tear, Distal:  2.5cm x 2cm x 0.1 cm with yellow, dull wound bed. Large amount of serous exudate noted on old dressing, no fresh drainage. Right BKA:  2.5cm x 2cm area of partial thickness skin injury with dried serum obscuring wound bed. No exudate. Wound bed: As described above Drainage (amount, consistency, odor) As described above Periwound: edematous, ecchymotic Dressing procedure/placement/frequency: Patient is on a mattress replacement with low air loss feature.  I have added a pressure redistribution heel boot for the left foot (patient has had a right BKA in the past).  His toenails are long and feet with poor hygiene, will require Podiatry consult or referral post discharge.  If you agree, please arrange or consult Case Management. Topical care of the RUE is to cover the skin tears with an antimicrobial nonadherent (Xeroform), pad, secure and elevate on pillow.  Orthopedics has been consulted  regarding the fracture and will follow.  Right stump topical care orders for silicone foam are provided.  [place] nursing team will not follow, but will remain available to this patient, the nursing and medical teams.  Please re-consult if needed. Thanks, Maudie Flakes, MSN, RN, [place], Ar[facility]  Pager# [phone]

## 2019-09-20 NOTE — Progress Notes (Signed)
Subjective:    Encephalopathic  NOw with condom cath  Stable GFR, SNa down to 145  UOP less than day before, not sure if accurate  Still on D5 '@100mL' /h  Objective Vital signs in last 24 hours: Vitals:   09/20/19 0500 09/20/19 0600 09/20/19 0732 09/20/19 0827  BP: (!) 150/80 (!) 159/108  (!) 154/78  Pulse: 65 68  63  Resp: (!) 22 14  (!) 22  Temp:   (!) 97.4 F (36.3 C)   TempSrc:   Oral   SpO2: 91% 94%  100%  Weight:      Height:       Weight change:   Intake/Output Summary (Last 24 hours) at 09/20/2019 0946 Last data filed at 09/20/2019 1950 Gross per 24 hour  Intake 4014.8 ml  Output 505 ml  Net 3509.8 ml    Assessment/Plan: 63 year old WM with DM, HTN and vascualr dz s/p BKA- unclear level of functioning or level of renal function prior to admit 1.Renal- A on CRF vs AKI in the setting of mild rhabdo after fall and prolonged time in one position.  SCr was around 4 in 2018 in care everywhere.  So might b e nearing a baseline.  Cont hydration as things are improving.   2. Hypertension/volume  - normally hypertensive on BP meds. Cont to monitor on CCB 3. Hyperkalmia-  resolved.  4. Anemia  -  s/p blood transfusion -   iron stores OK  on ESA. Hb 9s CTM 5.  Hypernatremia-  Cont D5W for another 24h. Improving.   [place]: Basic Metabolic Panel: Recent Labs  Lab 09/18/19 0353 09/19/19 0630 09/20/19 0530  NA 155* 150* 145  K 3.4* 3.5 3.2*  CL 113* 109 104  CO2 '31 29 28  ' GLUCOSE 157* 212* 250*  BUN 105* 88* 89*  CREATININE 5.87* 5.09* 5.02*  CALCIUM 7.0* 7.8* 7.1*  PHOS 4.7* 4.1 3.7   Liver Function Tests: Recent Labs  Lab 09/16/19 0345 09/18/19 0353 09/19/19 0630 09/20/19 0530  AST 30  --   --  17  ALT 21  --   --  17  ALKPHOS 46  --   --  49  BILITOT 0.9  --   --  0.5  PROT 4.3*  --   --  4.4*  ALBUMIN 2.0* 2.0* 2.1* 1.9*  1.9*   No results for input(s): LIPASE, AMYLASE in the last 168 hours. No results for input(s): AMMONIA in  the last 168 hours. CBC: Recent Labs  Lab 09/16/19 0345 09/17/19 0317 09/18/19 0353 09/19/19 0630 09/20/19 0530  WBC 5.8 6.4 10.4 10.4 10.5  NEUTROABS  --   --  8.6* 8.4* 8.0*  HGB 7.2* 7.4* 8.3* 9.6* 8.6*  HCT 22.0* 22.1* 25.6* 30.4* 27.2*  MCV 92.4 92.1 94.5 94.7 96.1  PLT 175 168 150 146* 123*   Cardiac Enzymes: Recent Labs  Lab 09/15/19 1822 09/16/19 0345 09/19/19 0630  CKTOTAL 1,452* 945* 298   CBG: Recent Labs  Lab 09/19/19 1945 09/19/19 2352 09/20/19 0413 09/20/19 0729 09/20/19 0929  GLUCAP 194* 247* 251* 185* 200*    Iron Studies:  Recent Labs    09/18/19 0353  IRON 46  TIBC 123*  FERRITIN 258   Studies/Results: AM DG Chest Port 1 View  Result Date: 09/19/2019 CLINICAL DATA:  Respiratory failure. EXAM: PORTABLE CHEST 1 VIEW COMPARISON:  09/18/2019 FINDINGS: Right IJ central venous catheter unchanged with tip over the SVC. Patient is  rotated to the right. Lungs are hypoinflated with minimal stable hazy prominence in the right infrahilar region and suprahilar region unchanged. No effusion. Cardiomediastinal silhouette and remainder the exam to include an old displaced right humeral neck fracture is unchanged. IMPRESSION: 1. Stable minimal hazy density over the right infrahilar and suprahilar regions likely infection. 2.  Right IJ central venous catheter unchanged. Electronically Signed   By: Marin Olp M.D.   On: 09/19/2019 07:53   DG Abd Portable 1V  Result Date: 09/18/2019 CLINICAL DATA:  63 year old male status post nasogastric tube placement EXAM: PORTABLE ABDOMEN - 1 VIEW COMPARISON:  Multiple prior abdominal radiographs earlier today FINDINGS: A gastric tube is been placed. The tip of the tube overlies the descending duodenum. Mild gaseous dilation of loops of small bowel in the left mid abdomen. Moderate rectal stool ball overlies the anatomic pelvis consistent with constipation. IMPRESSION: 1. The tip of the gastric tube overlies the descending  duodenum. If the intent is to decompress the stomach, recommend withdrawing 10 cm for optimal placement. 2. Multiple loops of mildly dilated and gas-filled small bowel present in the left mid abdomen. 3. Moderately large rectal stool ball suggests underlying constipation. Electronically Signed   By: Jacqulynn Cadet M.D.   On: 09/18/2019 14:53   DG Abd Portable 1V  Result Date: 09/18/2019 CLINICAL DATA:  Nasogastric tube placement EXAM: PORTABLE ABDOMEN - 1 VIEW COMPARISON:  Portable exam 1424 hours compared to 1415 hours FINDINGS: Nasogastric tube projects over RIGHT lower lobe bronchus, recommend removal and replacement. Nonobstructive bowel gas pattern. Bones appear demineralized. Minimal atelectasis or infiltrate at lung bases. IMPRESSION: Nasogastric tube extends into RIGHT lower lobe; recommend removal and replacement. Emergent results were called by telephone at the time of interpretation on 09/18/2019 at 2:36 pm to [facility] on [address] ([facility]), who verbally acknowledged these results. Electronically Signed   By: Lavonia Dana M.D.   On: 09/18/2019 14:37   DG Abd Portable 1V  Result Date: 09/18/2019 CLINICAL DATA:  Nasogastric tube repositioning at patient bedside. EXAM: PORTABLE ABDOMEN - 1 VIEW 2:15 p.m.: COMPARISON:  Portable abdomen x-ray earlier same day at 2:06 p.m. and previously. FINDINGS: The nasogastric tube remains looped in the proximal stomach with its tip back up in the esophagus. Stable bowel gas pattern as detailed on the earlier examination at 2:06 p.m. IMPRESSION: 1. Nasogastric tube remains looped in the proximal stomach with its tip back up in the esophagus. I recommend withdrawing the tube several centimeters and then re-advancing. 2. Stable bowel gas pattern as detailed on the earlier examination. These results will be called to the ordering clinician or representative by the Radiologist Assistant, and communication documented in the PACS or zVision Dashboard. Electronically  Signed   By: Evangeline Dakin M.D.   On: 09/18/2019 14:27   DG Abd Portable 1V  Result Date: 09/18/2019 CLINICAL DATA:  Bedside nasogastric tube placement. EXAM: PORTABLE ABDOMEN - 1 VIEW COMPARISON:  09/15/2019. FINDINGS: The nasogastric tube is looped in the proximal stomach and extends back up into the esophagus. Mild dilation of jejunal loops in the LEFT UPPER QUADRANT, with more dilated loops on the current examination. IMPRESSION: 1. Nasogastric tube is looped in the proximal stomach and extends back up into the esophagus. I recommend withdrawing the tube several centimeters and re-advancing. 2. Mild dilation of jejunal loops in the LEFT UPPER quadrant, more so than on the examination 3 days ago, query localized ileus or early partial small bowel obstruction. These results will be called  to the ordering clinician or representative by the Radiologist Assistant, and communication documented in the PACS or zVision Dashboard. Electronically Signed   By: Evangeline Dakin M.D.   On: 09/18/2019 14:22   VAS [place] UPPER EXTREMITY VENOUS DUPLEX  Result Date: 09/19/2019 UPPER VENOUS STUDY  Indications: Swelling, and s/p fall with right proximal humerus fracture Limitations: Poor ultrasound/tissue interface and line in neck, arm in sling, restricted mobility. Comparison Study: No prior study. Performing Technologist: Maudry Mayhew MHA, RDMS, RVT, RDCS  Examination Guidelines: A complete evaluation includes B-mode imaging, spectral Doppler, color Doppler, and power Doppler as needed of all accessible portions of each vessel. Bilateral testing is considered an integral part of a complete examination. Limited examinations for reoccurring indications may be performed as noted.  Right Findings: +----------+------------+---------+-----------+----------+--------------+ RIGHT     CompressiblePhasicitySpontaneousProperties   Summary     +----------+------------+---------+-----------+----------+--------------+  IJV                                                 Not visualized +----------+------------+---------+-----------+----------+--------------+ Subclavian                                          Not visualized +----------+------------+---------+-----------+----------+--------------+ Axillary                 Yes       Yes                             +----------+------------+---------+-----------+----------+--------------+ Brachial      Full       Yes       Yes                             +----------+------------+---------+-----------+----------+--------------+ Radial        Full                                                 +----------+------------+---------+-----------+----------+--------------+ Ulnar                                               Not visualized +----------+------------+---------+-----------+----------+--------------+ Cephalic      Full                                                 +----------+------------+---------+-----------+----------+--------------+ Basilic                                             Not visualized +----------+------------+---------+-----------+----------+--------------+  Left Findings: +----------+------------+---------+-----------+----------+--------------+ LEFT      CompressiblePhasicitySpontaneousProperties   Summary     +----------+------------+---------+-----------+----------+--------------+ Subclavian  Not visualized +----------+------------+---------+-----------+----------+--------------+  Summary:  Right: No evidence of deep vein thrombosis in the upper extremity. No evidence of superficial vein thrombosis in the upper extremity.  *See table(s) above for measurements and observations.  Diagnosing physician: Monica Martinez MD Electronically signed by Monica Martinez MD on 09/19/2019 at 1:47:07 PM.    Final    Medications: Infusions: . sodium chloride  10 mL/hr at 09/20/19 0000  . cefTRIAXone (ROCEPHIN)  IV Stopped (09/19/19 1701)  . dextrose 100 mL/hr at 09/20/19 0636    Scheduled Medications: . sodium chloride   Intravenous Once  . amLODipine  10 mg Oral Daily  . atorvastatin  40 mg Oral q1800  . chlorhexidine gluconate (MEDLINE KIT)  15 mL Mouth Rinse BID  . Chlorhexidine Gluconate Cloth  6 each Topical Daily  . darbepoetin (ARANESP) injection - NON-DIALYSIS  100 mcg Subcutaneous Q Thu-1800  . free water  300 mL Per Tube Q6H  . heparin  5,000 Units Subcutaneous Q8H  . hydrocortisone sod succinate (SOLU-CORTEF) inj  50 mg Intravenous Q12H   Followed by  . [START ON 09/21/2019] hydrocortisone sod succinate (SOLU-CORTEF) inj  50 mg Intravenous Daily  . insulin aspart  0-9 Units Subcutaneous Q4H  . pantoprazole  40 mg Oral QHS    have reviewed scheduled and prn medications.  Physical Exam: General: extubated -  Appears alert, interactive this AMHeart: RRR Lungs: clear Abdomen: soft, non tender Extremities: no peripheral edema-  Right arm bruised   09/20/2019,9:46 AM  LOS: 5 days

## 2019-09-20 NOTE — Procedures (Signed)
Cortrak  Person Inserting Tube:  Esaw Dace, RD Tube Type:  Cortrak - 43 inches Tube Location:  Right nare Initial Placement:  Postpyloric Secured by: Bridle Technique Used to Measure Tube Placement:  Documented cm marking at nare/ corner of mouth Cortrak Secured At:  78 cm Procedure Comments:  Cortrak Tube Team Note:  Consult received to place a Cortrak feeding tube.   No x-ray is required. RN may begin using tube.   [employer] MS, RDN, LDN, CNSC [phone] Pager  [phone] Weekend/On-Call Pager

## 2019-09-20 NOTE — Progress Notes (Signed)
PT Cancellation Note  Patient Details Name: Ryan Villa MRN: [id number] DOB: 01-26-1956   Cancelled Treatment:    Reason Eval/Treat Not Completed: Patient at procedure or test/unavailable. Pt down for MBS. Will check on later.    Shary Decamp [facility] 09/20/2019, 11:31 AM [place] Pager [phone] Office [phone]

## 2019-09-20 NOTE — Progress Notes (Signed)
Modified Barium Swallow Progress Note  Patient Details  Name: Ryan Villa MRN: [id number] Date of Birth: 10-02-1955  Today's Date: 09/20/2019  Modified Barium Swallow completed.  Full report located under Chart Review in the Imaging Section.  Brief recommendations include the following:  Clinical Impression  Pt presents with a moderate-severe dysphagia at this time, marked by poor timing and inconsistent closure of the laryngeal vestibule, leading to silent aspiration of nectar and honey thick liquids.  Pt eventually began coughing at the end of the study, but aspiration did not lead to any protective sensory response immediately after the swallow.  There was diffuse residue in the valleculae and pyriform spaces that would accumulate, and then partially clear during subsequent swallows.  Pt had   difficulty following instructions for protective postures.  Purees appeared to be the safest consistency - however, pt's motor response was so inconsistent and, given his absent sensation to aspiration, I would be hesitant to continue a pureed diet. For now, recommend NPO; allow meds crushed in puree. Consider cortrak.  SLP will follow for pharyngeal timing/strengthening.  Anticipate improvements as mental status improves. D/W RN and Secure Messaged Dr. Manuella Ghazi with results.   Swallow Evaluation Recommendations       SLP Diet Recommendations: NPO except meds       Medication Administration: Crushed with puree               Oral Care Recommendations: Oral care QID       Orli Degrave L. Tivis Ringer, [place] Office number [phone] Pager [phone]  Juan Quam Laurice 09/20/2019,12:44 PM

## 2019-09-20 NOTE — Progress Notes (Signed)
PROGRESS NOTE    Ryan Villa  HCW:[id number] DOB: 1956/05/07 DOA: 09/15/2019 PCP: Maryella Shivers, MD   Brief Narrative:   This is a 63 year old male past medical history CKD stage IV, diabetes mellitus type 2, hyperlipidemia, hypertension, left carotid stenosis, intracerebral hemorrhage, status post right BKA presented to [facility] emergency department after apparently falling at his home 3 days ago, apparently lying on the floor since that time. EMS reported that the patient'ssister was bringing him food while he was lying on the floor during that 3-day period.He is status post recent right BKA and uses a prosthetic and is supposed to use a walker but is noncompliant with his per family. Reportedly not using his walker when he fell and landed on his right side. Unclear if there was loss of consciousness. At that time the patient did not want his sister to call 911. On 12/23 she called 911 because she noticed that he was having a hard time breathing andbruising on his right shoulder. On arrival to [facility] ED the patient was able to answer questions with difficulty in understanding his speech. He washypoxic on room air and placed on nonrebreather, noted to appear severely ill appearingand tachypneic. He was alsonoted to be covered in dried stool/urine. He complained of right shoulder pain. Work-up and findings significant for hypothermia with a temperature of 93.9 F, significant ecchymosis over the right shoulder area, hypotensive, given IV fluids. He progressed to have worsening respiratory distress, hypotension, became unresponsive and subsequently intubated, central line placed. He had significant metabolic derangements including sodium 149, potassium 6.8, BUN 172, creatinine10.5.CK 2411. Procalcitonin 0.61.UDS negative. pH less than 7 per ABG report. He responded fairly well to IV fluids andbrieflyrequiredpressor support. Repeat labs showed minimal improvement in  metabolic derangements. He had some urine output, milky white in color. Noted right humeral neck fracture on x-ray per ER paper chart,right upper lobe consolidation per chest x-ray, head CT no acute intracranial process,Diffuse parasinusinflammation.  Per critical care attending Dr. Nelda Marseille patient was tested negative for Covid at [facility].  12/28: Patient still confused and trying to get out of bed. Currently on 6L [place] and producing urine with stable renal function noted. Continue D5 IVF. Ortho plans for shoulder surgery by 12/30. SLP evaluation ordered for dysphagia.  Assessment & Plan:   Principal Problem:   Closed fracture of right proximal humerus Active Problems:   Acute respiratory failure (HCC)   AKI (acute kidney injury) ([place])   Rhabdomyolysis   Acute renal failure (ARF) (HCC)   Pressure injury of skin   Protein-calorie malnutrition, severe   Acute respiratory failure with hypoxia on presentation to [facility] -He was intubated and transferred to [facility] ICU -Patient self extubated on December 25, he remains on room air no hypoxia at rest. -He does have a cough when taking sips of water post extubation, will get speech eval, aspiration precaution -Currently on 6L [place]  Lobar pneumonia, right -Per Dr. Nelda Marseille, patient was tested negative for Covid at [facility] -No sputum culture obtained -Blood culture negative -He did spike fever 101.1 on December 25, no fever last 24 hours -He has been treated with Rocephin and Zithromax since admission, today is day 5 of antibiotic treatment  UTI -Urine culture on admission is positive for E. Coli, sensitive to Rocephin -He is treated with Rocephin since admission   Hypernatremia -On D5 infusion, improving -Appreciate nephrology input with plans to continue for now  AKI on CKD -Unknown baseline -BUN 170 creatinine 9.68 on presentation, improving,  BUN 88 creatinine 5.09 today -512m urine output last 24  hours -Nephrology following, input appreciated with recommendations to continue D5 -BMP am  Rhabdomyolysis -Resolved  Normocytic anemia-stable -tibili unremarkable, he does not Appear to have any overt blood loss, likely underlying anemia of chronic disease -Status post 1 piece RBC transfusion on December 23, getting EPO injection prerenal -monitor hgb, will check FOBt which is pending  Hypertension-stable Norvasc  Displaced right surgical neck humerus fracture ortho Dr XErlinda Hongrecommended leave in shoulder sling, will eventually need shoulder replacement when medically stable  Right hand edema -Elevate right upper extremity, venous Doppler to rule out DVT  History of right BKA, stable  Elevated blood glucose on D5 -I do not see any diabetes medication on his home med rec list -A1c obtained on December 23  is 5.4, however A1c test is not reliable in the setting of AKI and anemia -A1c 5.7% -Taper stress dose steroid(Patient has been on stress dose steroids since admission) -Continue SSI  Acute Metabolic encephalopathy/delirium -Initial CT head no acute findings, multiple remote lacunar strokes seen CT scan -He does have metabolic derangement which is improving -Frequent reorientation -Transfer out of ICU  H/o CVA: LDL low Hold lipitor Restart plavix if fobt negative    DVT prophylaxis:Heparin Code Status: Full Family Communication: Patient Disposition Plan: Plan for shoulder surgery by 12/30 per ortho Dr.Rogers. Continue IVF and monitor labs. Wean O2. SLP evaluation.   Consultants:   Nephrology  Orthopedics  Procedures:   [facility] self [facility] on [facility] 25  NG placement, and pulled out NG  Right IJ placement on December 23  Antimicrobials:  Anti-infectives (From admission, onward)   Start     Dose/Rate Route Frequency Ordered Stop   09/15/19 1715  azithromycin (ZITHROMAX) 500 mg in sodium chloride 0.9 % 250 mL IVPB   Status:  Discontinued     500 mg 250 mL/hr over 60 Minutes Intravenous Every 24 hours 09/15/19 1711 09/18/19 1107   09/15/19 1715  cefTRIAXone (ROCEPHIN) 1 g in sodium chloride 0.9 % 100 mL IVPB     1 g 200 mL/hr over 30 Minutes Intravenous Every 24 hours 09/15/19 1711 09/22/19 1714       Subjective: Patient seen and evaluated today with no new acute complaints or concerns. No acute concerns or events noted overnight. Appears confused and tries to get out of bed.  Objective: Vitals:   09/20/19 0500 09/20/19 0600 09/20/19 0732 09/20/19 0827  BP: (!) 150/80 (!) 159/108  (!) 154/78  Pulse: 65 68  63  Resp: (!) 22 14  (!) 22  Temp:   (!) 97.4 F (36.3 C)   TempSrc:   Oral   SpO2: 91% 94%  100%  Weight:      Height:        Intake/Output Summary (Last 24 hours) at 09/20/2019 1023 Last data filed at 09/20/2019 05366Gross per 24 hour  Intake 4014.8 ml  Output 505 ml  Net 3509.8 ml   Filed Weights   09/16/19 1124 09/18/19 0500  Weight: 68.5 kg 69.7 kg    Examination:  General exam: Appears calm and comfortable , confused and minimally agitated Respiratory system: Clear to auscultation. Respiratory effort normal. On 6L [place] Cardiovascular system: S1 & S2 heard, RRR. No JVD, murmurs, rubs, gallops or clicks. No pedal edema. Gastrointestinal system: Abdomen is nondistended, soft and nontender. No organomegaly or masses felt. Normal bowel sounds heard. Central nervous system: Alert and awake Extremities: No edema.  Skin: No rashes, lesions or ulcers Psychiatry: Difficult to assess.    Data Reviewed: I have personally reviewed following labs and imaging studies  CBC: Recent Labs  Lab 09/15/19 1822 09/16/19 0345 09/17/19 0317 09/17/19 0347 09/17/19 2353 09/18/19 0353 09/19/19 0630 09/20/19 0530  WBC 4.6 5.8 6.4  --   --  10.4 10.4 10.5  NEUTROABS 3.9  --   --   --   --  8.6* 8.4* 8.0*  HGB 6.8* 7.2* 7.4* 6.8* 7.5* 8.3* 9.6* 8.6*  HCT 21.3* 22.0* 22.1* 20.0* 22.0*  25.6* 30.4* 27.2*  MCV 94.2 92.4 92.1  --   --  94.5 94.7 96.1  PLT 190 175 168  --   --  150 146* 841*   Basic Metabolic Panel: Recent Labs  Lab 09/15/19 1822 09/16/19 0345 09/16/19 0354 09/17/19 0317 09/17/19 1309 09/17/19 2353 09/18/19 0353 09/19/19 0630 09/20/19 0530  NA 151* 153*  --  156* 154* 152* 155* 150* 145  K 5.8* 4.6  --  3.4* 3.5 3.4* 3.4* 3.5 3.2*  CL 122* 121*   < > 110 113*  --  113* 109 104  CO2 12* 15*   < > 31 30  --  _0 GLUCOSE 211* 213*   < > 243* 174*  --  157* 212* 250*  BUN 170* 162*   < > 127* 116*  --  105* 88* 89*  CREATININE 9.68* 8.66*   < > 6.98* 6.44*  --  5.87* 5.09* 5.02*  CALCIUM 7.0* 7.0*   < > 6.9* 6.6*  --  7.0* 7.8* 7.1*  MG 2.1 2.1  --  1.8  --   --  1.7 1.7  --   PHOS 10.4* 7.2*  --  5.1*  --   --  4.7* 4.1 3.7   < > = values in this interval not displayed.   GFR: Estimated Creatinine Clearance: 14.6 mL/min (A) (by C-G formula based on SCr of 5.02 mg/dL (H)). Liver Function Tests: Recent Labs  Lab 09/16/19 0345 09/17/19 0317 09/18/19 0353 09/19/19 0630 09/20/19 0530  AST 30  --   --   --  17  ALT 21  --   --   --  17  ALKPHOS 46  --   --   --  49  BILITOT 0.9  --   --   --  0.5  PROT 4.3*  --   --   --  4.4*  ALBUMIN 2.0* 2.0* 2.0* 2.1* 1.9*  1.9*   No results for input(s): LIPASE, AMYLASE in the last 168 hours. No results for input(s): AMMONIA in the last 168 hours. Coagulation Profile: No results for input(s): INR, PROTIME in the last 168 hours. Cardiac Enzymes: Recent Labs  Lab 09/15/19 1822 09/16/19 0345 09/19/19 0630  CKTOTAL 1,452* 945* 298   BNP (last 3 results) No results for input(s): PROBNP in the last 8760 hours. HbA1C: Recent Labs    09/20/19 0530  HGBA1C 5.7*   CBG: Recent Labs  Lab 09/19/19 1945 09/19/19 2352 09/20/19 0413 09/20/19 0729 09/20/19 0929  GLUCAP 194* 247* 251* 185* 200*   Lipid Profile: Recent Labs    09/20/19 0530  CHOL 92  HDL 30*  LDLCALC 39  TRIG 117    CHOLHDL 3.1   Thyroid Function Tests: No results for input(s): TSH, T4TOTAL, FREET4, T3FREE, THYROIDAB in the last 72 hours. Anemia Panel: Recent Labs    09/18/19 0353  FERRITIN 258  TIBC 123*  IRON 46   Sepsis Labs: Recent Labs  Lab 09/15/19 1817 09/15/19 1822 09/15/19 2050  PROCALCITON  --  2.92  --   LATICACIDVEN 1.2  --  1.3    Recent Results (from the past 240 hour(s))  MRSA PCR Screening     Status: Abnormal   Collection Time: 09/15/19  4:41 PM   Specimen: Nasal Mucosa; Nasopharyngeal  Result Value Ref Range Status   MRSA by PCR POSITIVE (A) NEGATIVE Final    Comment:        The GeneXpert MRSA Assay (FDA approved for NASAL specimens only), is one component of a comprehensive MRSA colonization surveillance program. It is not intended to diagnose MRSA infection nor to guide or monitor treatment for MRSA infections. RESULT CALLED TO, READ BACK BY AND VERIFIED WITH: D [facility] RN 09/15/19 2120 JDW Performed at [facility] [address] [place], [place] [address]   Urine culture     Status: Abnormal   Collection Time: 09/15/19  4:47 PM   Specimen: Urine, Random  Result Value Ref Range Status   Specimen Description URINE, RANDOM  Final   Special Requests   Final    NONE Performed at [facility], [place] [address], [place], [place] [address]    Culture 80,000 COLONIES/mL ESCHERICHIA COLI (A)  Final   Report Status 09/18/2019 FINAL  Final   Organism ID, Bacteria ESCHERICHIA COLI (A)  Final      Susceptibility   Escherichia coli - MIC*    AMPICILLIN >=32 RESISTANT Resistant     CEFAZOLIN 8 SENSITIVE Sensitive     CEFTRIAXONE <=1 SENSITIVE Sensitive     CIPROFLOXACIN <=0.25 SENSITIVE Sensitive     GENTAMICIN <=1 SENSITIVE Sensitive     IMIPENEM <=0.25 SENSITIVE Sensitive     NITROFURANTOIN <=16 SENSITIVE Sensitive     TRIMETH/SULFA <=20 SENSITIVE Sensitive     AMPICILLIN/SULBACTAM 16 INTERMEDIATE Intermediate     PIP/TAZO 8 SENSITIVE  Sensitive     * 80,000 COLONIES/mL ESCHERICHIA COLI  Culture, blood (routine x 2)     Status: None (Preliminary result)   Collection Time: 09/15/19  6:12 PM   Specimen: BLOOD LEFT FOREARM  Result Value Ref Range Status   Specimen Description BLOOD LEFT FOREARM  Final   Special Requests   Final    BOTTLES DRAWN AEROBIC AND ANAEROBIC Blood Culture results may not be optimal due to an inadequate volume of blood received in culture bottles   Culture   Final    NO GROWTH 4 DAYS Performed at [facility], [place] [address], [place], [place] [address]    Report Status PENDING  Incomplete  Culture, blood (routine x 2)     Status: None (Preliminary result)   Collection Time: 09/15/19  6:17 PM   Specimen: BLOOD LEFT HAND  Result Value Ref Range Status   Specimen Description BLOOD LEFT HAND  Final   Special Requests   Final    BOTTLES DRAWN AEROBIC AND ANAEROBIC Blood Culture results may not be optimal due to an inadequate volume of blood received in culture bottles   Culture   Final    NO GROWTH 4 DAYS Performed at [facility], [place] [address], [place], [place] [address]    Report Status PENDING  Incomplete         Radiology Studies: AM DG Chest Port 1 View  Result Date: 09/19/2019 CLINICAL DATA:  Respiratory failure. EXAM: PORTABLE CHEST 1 VIEW COMPARISON:  09/18/2019 FINDINGS: Right IJ central venous catheter unchanged with tip over the SVC. Patient is rotated to the right. Lungs are hypoinflated with minimal stable hazy prominence in the right infrahilar region and suprahilar region unchanged. No effusion. Cardiomediastinal silhouette and remainder the exam to include an old displaced right humeral neck fracture is unchanged. IMPRESSION: 1. Stable minimal hazy density over the right infrahilar and suprahilar regions likely infection. 2.  Right IJ central venous catheter unchanged. Electronically Signed   By: Marin Olp M.D.   On: 09/19/2019 07:53   DG Abd Portable  1V  Result Date: 09/18/2019 CLINICAL DATA:  63 year old male status post nasogastric tube placement EXAM: PORTABLE ABDOMEN - 1 VIEW COMPARISON:  Multiple prior abdominal radiographs earlier today FINDINGS: A gastric tube is been placed. The tip of the tube overlies the descending duodenum. Mild gaseous dilation of loops of small bowel in the left mid abdomen. Moderate rectal stool ball overlies the anatomic pelvis consistent with constipation. IMPRESSION: 1. The tip of the gastric tube overlies the descending duodenum. If the intent is to decompress the stomach, recommend withdrawing 10 cm for optimal placement. 2. Multiple loops of mildly dilated and gas-filled small bowel present in the left mid abdomen. 3. Moderately large rectal stool ball suggests underlying constipation. Electronically Signed   By: Jacqulynn Cadet M.D.   On: 09/18/2019 14:53   DG Abd Portable 1V  Result Date: 09/18/2019 CLINICAL DATA:  Nasogastric tube placement EXAM: PORTABLE ABDOMEN - 1 VIEW COMPARISON:  Portable exam 1424 hours compared to 1415 hours FINDINGS: Nasogastric tube projects over RIGHT lower lobe bronchus, recommend removal and replacement. Nonobstructive bowel gas pattern. Bones appear demineralized. Minimal atelectasis or infiltrate at lung bases. IMPRESSION: Nasogastric tube extends into RIGHT lower lobe; recommend removal and replacement. Emergent results were called by telephone at the time of interpretation on 09/18/2019 at 2:36 pm to [facility] on [address] ([facility]), who verbally acknowledged these results. Electronically Signed   By: Lavonia [place] M.D.   On: 09/18/2019 14:37   DG Abd Portable 1V  Result Date: 09/18/2019 CLINICAL DATA:  Nasogastric tube repositioning at patient bedside. EXAM: PORTABLE ABDOMEN - 1 VIEW 2:15 p.m.: COMPARISON:  Portable abdomen x-ray earlier same day at 2:06 p.m. and previously. FINDINGS: The nasogastric tube remains looped in the proximal stomach with its tip back up in the  esophagus. Stable bowel gas pattern as detailed on the earlier examination at 2:06 p.m. IMPRESSION: 1. Nasogastric tube remains looped in the proximal stomach with its tip back up in the esophagus. I recommend withdrawing the tube several centimeters and then re-advancing. 2. Stable bowel gas pattern as detailed on the earlier examination. These results will be called to the ordering clinician or representative by the Radiologist Assistant, and communication documented in the PACS or zVision Dashboard. Electronically Signed   By: Evangeline Dakin M.D.   On: 09/18/2019 14:27   DG Abd Portable 1V  Result Date: 09/18/2019 CLINICAL DATA:  Bedside nasogastric tube placement. EXAM: PORTABLE ABDOMEN - 1 VIEW COMPARISON:  09/15/2019. FINDINGS: The nasogastric tube is looped in the proximal stomach and extends back up into the esophagus. Mild dilation of jejunal loops in the LEFT UPPER QUADRANT, with more dilated loops on the current examination. IMPRESSION: 1. Nasogastric tube is looped in the proximal stomach and extends back up into the esophagus. I recommend withdrawing the tube several centimeters and re-advancing. 2. Mild dilation of jejunal loops in the LEFT UPPER quadrant, more so than on the examination 3 days  ago, query localized ileus or early partial small bowel obstruction. These results will be called to the ordering clinician or representative by the Radiologist Assistant, and communication documented in the PACS or zVision Dashboard. Electronically Signed   By: Evangeline Dakin M.D.   On: 09/18/2019 14:22   VAS [place] UPPER EXTREMITY VENOUS DUPLEX  Result Date: 09/19/2019 UPPER VENOUS STUDY  Indications: Swelling, and s/p fall with right proximal humerus fracture Limitations: Poor ultrasound/tissue interface and line in neck, arm in sling, restricted mobility. Comparison Study: No prior study. Performing Technologist: Maudry Mayhew MHA, RDMS, RVT, RDCS  Examination Guidelines: A complete evaluation  includes B-mode imaging, spectral Doppler, color Doppler, and power Doppler as needed of all accessible portions of each vessel. Bilateral testing is considered an integral part of a complete examination. Limited examinations for reoccurring indications may be performed as noted.  Right Findings: +----------+------------+---------+-----------+----------+--------------+ RIGHT     CompressiblePhasicitySpontaneousProperties   Summary     +----------+------------+---------+-----------+----------+--------------+ IJV                                                 Not visualized +----------+------------+---------+-----------+----------+--------------+ Subclavian                                          Not visualized +----------+------------+---------+-----------+----------+--------------+ Axillary                 Yes       Yes                             +----------+------------+---------+-----------+----------+--------------+ Brachial      Full       Yes       Yes                             +----------+------------+---------+-----------+----------+--------------+ Radial        Full                                                 +----------+------------+---------+-----------+----------+--------------+ Ulnar                                               Not visualized +----------+------------+---------+-----------+----------+--------------+ Cephalic      Full                                                 +----------+------------+---------+-----------+----------+--------------+ Basilic                                             Not visualized +----------+------------+---------+-----------+----------+--------------+  Left Findings: +----------+------------+---------+-----------+----------+--------------+ LEFT      CompressiblePhasicitySpontaneousProperties   Summary     +----------+------------+---------+-----------+----------+--------------+  Subclavian  Not visualized +----------+------------+---------+-----------+----------+--------------+  Summary:  Right: No evidence of deep vein thrombosis in the upper extremity. No evidence of superficial vein thrombosis in the upper extremity.  *See table(s) above for measurements and observations.  Diagnosing physician: Monica Martinez MD Electronically signed by Monica Martinez MD on 09/19/2019 at 1:47:07 PM.    Final         Scheduled Meds: . sodium chloride   Intravenous Once  . amLODipine  10 mg Oral Daily  . atorvastatin  40 mg Oral q1800  . chlorhexidine gluconate (MEDLINE KIT)  15 mL Mouth Rinse BID  . Chlorhexidine Gluconate Cloth  6 each Topical Daily  . darbepoetin (ARANESP) injection - NON-DIALYSIS  100 mcg Subcutaneous Q Thu-1800  . free water  300 mL Per Tube Q6H  . heparin  5,000 Units Subcutaneous Q8H  . hydrocortisone sod succinate (SOLU-CORTEF) inj  50 mg Intravenous Q12H   Followed by  . [START ON 09/21/2019] hydrocortisone sod succinate (SOLU-CORTEF) inj  50 mg Intravenous Daily  . insulin aspart  0-9 Units Subcutaneous Q4H  . pantoprazole  40 mg Oral QHS  . potassium chloride  20 mEq Oral Once   Continuous Infusions: . sodium chloride 10 mL/hr at 09/20/19 0000  . cefTRIAXone (ROCEPHIN)  IV Stopped (09/19/19 1701)  . dextrose 100 mL/hr at 09/20/19 0636     LOS: 5 days    Time spent: 30 minutes    Dierra Riesgo Darleen Crocker, DO [facility] Pager [phone]  If 7PM-7AM, please contact night-coverage [email or web address] Password [facility] 09/20/2019, 10:23 AM

## 2019-09-20 NOTE — Evaluation (Signed)
Physical Therapy Evaluation Patient Details Name: Ryan Villa MRN: [id number] DOB: 01-20-1956 Today's Date: 09/20/2019   History of Present Illness  Pt adm with rt humeral fx after fall at home and in the floor x 3 days. Pt intubated 12/23 and self extubated 12/25. Pt with acute metabolic encephalopathy and delirium, PMH - rt bka, ckd, dm, htn, iintracerebral hemorrhage  Clinical Impression  Pt admitted with above diagnosis and presents to PT with functional limitations due to deficits listed below (See PT problem list). Pt needs skilled PT to maximize independence and safety to allow discharge to SNF. Will need prosthesis prior to attempting any standing.      Follow Up Recommendations SNF;Supervision/Assistance - 24 hour    Equipment Recommendations  None recommended by PT    Recommendations for Other Services       Precautions / Restrictions Precautions Precautions: Fall Required Braces or Orthoses: Sling Restrictions Weight Bearing Restrictions: Yes RUE Weight Bearing: Non weight bearing      Mobility  Bed Mobility Overal bed mobility: Needs Assistance Bed Mobility: Supine to Sit;Sit to Supine     Supine to sit: Mod assist Sit to supine: Min assist   General bed mobility comments: Assist to elevate trunk and bring hips to EOB  Transfers                 General transfer comment: didn't attempt due to no prosthesis and pt NWB on RUE  Ambulation/Gait                Stairs            Wheelchair Mobility    Modified Rankin (Stroke Patients Only)       Balance Overall balance assessment: Needs assistance Sitting-balance support: Feet supported;No upper extremity supported Sitting balance-Leahy Scale: Fair Sitting balance - Comments: sat EOB x 5 minutes with supervision for static sitting                                     Pertinent Vitals/Pain Pain Assessment: Faces Faces Pain Scale: Hurts little more Pain Location:  rt shoulder/arm Pain Descriptors / Indicators: Grimacing;Guarding Pain Intervention(s): Repositioned;Monitored during session;Limited activity within patient's tolerance    Home Living Family/patient expects to be discharged to:: Private residence Living Arrangements: Parent     Home Access: Stairs to enter   Technical brewer of Steps: 2-3 Home Layout: One level Home Equipment: Environmental consultant - 2 wheels;Wheelchair - manual Additional Comments: information limited due to confusion    Prior Function Level of Independence: Needs assistance   Gait / Transfers Assistance Needed: mod independent with prosthesis. Has walker but doesn't use           Hand Dominance   Dominant Hand: Right    Extremity/Trunk Assessment   Upper Extremity Assessment Upper Extremity Assessment: Defer to OT evaluation    Lower Extremity Assessment Lower Extremity Assessment: Generalized weakness;RLE deficits/detail RLE Deficits / Details: BKA       Communication   Communication: Expressive difficulties(low volume and difficult to understand)  Cognition Arousal/Alertness: Awake/alert Behavior During Therapy: WFL for tasks assessed/performed Overall Cognitive Status: Impaired/Different from baseline Area of Impairment: Orientation;Attention;Memory;Following commands;Safety/judgement;Awareness;Problem solving                 Orientation Level: Disoriented to;Time;Situation;Place Current Attention Level: Sustained Memory: Decreased recall of precautions;Decreased short-term memory Following Commands: Follows one step commands inconsistently Safety/Judgement: Decreased awareness of  safety;Decreased awareness of deficits Awareness: Intellectual Problem Solving: Slow processing;Decreased initiation;Difficulty sequencing;Requires verbal cues;Requires tactile cues        General Comments General comments (skin integrity, edema, etc.): VSS    Exercises     Assessment/Plan    PT  Assessment Patient needs continued PT services  PT Problem List Decreased strength;Decreased activity tolerance;Decreased balance;Decreased mobility;Decreased cognition;Decreased safety awareness;Pain       PT Treatment Interventions DME instruction;Gait training;Functional mobility training;Therapeutic activities;Therapeutic exercise;Balance training;Cognitive remediation;Patient/family education    PT Goals (Current goals can be found in the Care Plan section)  Acute Rehab PT Goals Patient Stated Goal: not stated PT Goal Formulation: With patient Time For Goal Achievement: 10/04/19 Potential to Achieve Goals: Good    Frequency Min 2X/week   Barriers to discharge        Co-evaluation               AM-PAC PT "6 Clicks" Mobility  Outcome Measure Help needed turning from your back to your side while in a flat bed without using bedrails?: A Little Help needed moving from lying on your back to sitting on the side of a flat bed without using bedrails?: A Lot Help needed moving to and from a bed to a chair (including a wheelchair)?: Total Help needed standing up from a chair using your arms (e.g., wheelchair or bedside chair)?: Total Help needed to walk in hospital room?: Total Help needed climbing 3-5 steps with a railing? : Total 6 Click Score: 9    End of Session Equipment Utilized During Treatment: Oxygen Activity Tolerance: Patient tolerated treatment well Patient left: in bed;with call bell/phone within reach;with bed alarm set Nurse Communication: Mobility status PT Visit Diagnosis: Other abnormalities of gait and mobility (R26.89);Muscle weakness (generalized) (M62.81);Pain Pain - Right/Left: Right Pain - part of body: Shoulder    Time: [id number] PT Time Calculation (min) (ACUTE ONLY): 18 min   Charges:   PT Evaluation $PT Eval Moderate Complexity: [place] Pager [phone] Office  [place] 09/20/2019, 2:21 PM

## 2019-09-21 LAB — CBC
HCT: 24.2 % — ABNORMAL LOW (ref 39.0–52.0)
Hemoglobin: 7.8 g/dL — ABNORMAL LOW (ref 13.0–17.0)
MCH: 31 pg (ref 26.0–34.0)
MCHC: 32.2 g/dL (ref 30.0–36.0)
MCV: 96 fL (ref 80.0–100.0)
Platelets: 79 10*3/uL — ABNORMAL LOW (ref 150–400)
RBC: 2.52 MIL/uL — ABNORMAL LOW (ref 4.22–5.81)
RDW: 13.2 % (ref 11.5–15.5)
WBC: 11.1 10*3/uL — ABNORMAL HIGH (ref 4.0–10.5)
nRBC: 0.4 % — ABNORMAL HIGH (ref 0.0–0.2)

## 2019-09-21 LAB — GLUCOSE, CAPILLARY
Glucose-Capillary: 279 mg/dL — ABNORMAL HIGH (ref 70–99)
Glucose-Capillary: 268 mg/dL — ABNORMAL HIGH (ref 70–99)
Glucose-Capillary: 231 mg/dL — ABNORMAL HIGH (ref 70–99)
Glucose-Capillary: 276 mg/dL — ABNORMAL HIGH (ref 70–99)
Glucose-Capillary: 184 mg/dL — ABNORMAL HIGH (ref 70–99)
Glucose-Capillary: 162 mg/dL — ABNORMAL HIGH (ref 70–99)

## 2019-09-21 LAB — RENAL FUNCTION PANEL
Albumin: 1.7 g/dL — ABNORMAL LOW (ref 3.5–5.0)
Anion gap: 9 (ref 5–15)
BUN: 84 mg/dL — ABNORMAL HIGH (ref 8–23)
CO2: 28 mmol/L (ref 22–32)
Calcium: 6.8 mg/dL — ABNORMAL LOW (ref 8.9–10.3)
Chloride: 103 mmol/L (ref 98–111)
Creatinine, Ser: 5.17 mg/dL — ABNORMAL HIGH (ref 0.61–1.24)
GFR calc Af Amer: 13 mL/min — ABNORMAL LOW (ref >60)
GFR calc non Af Amer: 11 mL/min — ABNORMAL LOW (ref >60)
Glucose, Bld: 308 mg/dL — ABNORMAL HIGH (ref 70–99)
Phosphorus: 3.9 mg/dL (ref 2.5–4.6)
Potassium: 3.1 mmol/L — ABNORMAL LOW (ref 3.5–5.1)
Sodium: 140 mmol/L (ref 135–145)

## 2019-09-21 LAB — MAGNESIUM: Magnesium: 1.4 mg/dL — ABNORMAL LOW (ref 1.7–2.4)

## 2019-09-21 LAB — SURGICAL PCR SCREEN
MRSA, PCR: POSITIVE — AB
Staphylococcus aureus: POSITIVE — AB

## 2019-09-21 MED ORDER — POTASSIUM CHLORIDE 20 MEQ/15ML (10%) PO SOLN
40.0000 meq | Freq: Once | ORAL | Status: AC
  Administered 2019-09-21: 09:00:00 40 meq
  Filled 2019-09-21: qty 30

## 2019-09-21 MED ORDER — POVIDONE-IODINE 10 % EX SWAB
2.0000 "application " | Freq: Once | CUTANEOUS | Status: AC
  Administered 2019-09-21: 2 via TOPICAL

## 2019-09-21 MED ORDER — CHLORHEXIDINE GLUCONATE 4 % EX LIQD
60.0000 mL | Freq: Once | CUTANEOUS | Status: DC
  Filled 2019-09-21 (×2): qty 60

## 2019-09-21 MED ORDER — PANTOPRAZOLE SODIUM 40 MG IV SOLR
40.0000 mg | Freq: Every day | INTRAVENOUS | Status: DC
  Administered 2019-09-21 – 2019-09-28 (×8): 40 mg via INTRAVENOUS
  Filled 2019-09-21 (×8): qty 40

## 2019-09-21 MED ORDER — MAGNESIUM SULFATE 2 GM/50ML IV SOLN
2.0000 g | Freq: Once | INTRAVENOUS | Status: AC
  Administered 2019-09-21: 09:00:00 2 g via INTRAVENOUS
  Filled 2019-09-21: qty 50

## 2019-09-21 MED ORDER — ENSURE PRE-SURGERY PO LIQD
296.0000 mL | Freq: Once | ORAL | Status: DC
  Filled 2019-09-21: qty 296

## 2019-09-21 NOTE — TOC Initial Note (Signed)
Transition of Care [facility]) - Initial/Assessment Note    Patient Details  Name: Ryan Villa MRN: [id number] Date of Birth: 20-Apr-1956  [facility]) CM/SW Contact:    Archie Endo, LCSW Phone Number: 09/21/2019, 10:36 AM  Clinical Narrative: CSW attempted to speak with patient at bedside, however patient sleepy soundly and did not waken to voice. CSW contacted patient's mother Ryan Villa at [phone] to complete assessment. Ryan Villa reports the patient was living with her prior to admission. Ryan Villa is agreeable to SNF placement at discharge. Ryan Villa requests placement be in [facility].  Patient will have shoulder surgery tomorrow. PT is currently recommending SNF. CSW will continue to follow patient and will fax his information to facilities post procedure to ensure accurate placement.                   Expected Discharge Plan: Skilled Nursing Facility Barriers to Discharge: Continued Medical Work up   Patient Goals and CMS Choice        Expected Discharge Plan and Services Expected Discharge Plan: [place] arrangements for the past 2 months: Single Family Home                                      Prior Living Arrangements/Services Living arrangements for the past 2 months: Single Family Home Lives with:: Parents Patient language and need for interpreter reviewed:: No        Need for Family Participation in Patient Care: Yes (Comment) Care giver support system in place?: Yes (comment)   Criminal Activity/Legal Involvement Pertinent to Current Situation/Hospitalization: No - Comment as needed  Activities of Daily Living      Permission Sought/Granted                  Emotional Assessment Appearance:: Appears older than stated age Attitude/Demeanor/Rapport: Unable to Assess Affect (typically observed): Unable to Assess Orientation: : Fluctuating Orientation (Suspected and/or reported Sundowners), Oriented to Self,  Oriented to Place, Oriented to Situation, Oriented to  Time Alcohol / Substance Use: Not Applicable Psych Involvement: No (comment)  Admission diagnosis:  Acute renal failure (ARF) ([place]) [N17.9] Patient Active Problem List   Diagnosis Date Noted  . Protein-calorie malnutrition, severe 09/17/2019  . Acute respiratory failure ([place]) 09/15/2019  . AKI (acute kidney injury) ([place]) 09/15/2019  . Rhabdomyolysis 09/15/2019  . Acute renal failure (ARF) ([place]) 09/15/2019  . Pressure injury of skin 09/15/2019  . Closed fracture of right proximal humerus 09/15/2019  . Carotid stenosis, left 04/28/2014  . Stroke ([place]) 04/28/2014  . Diabetes ([place]) 11/03/2013  . HTN (hypertension) 11/03/2013  . Hyperlipidemia with target low density lipoprotein (LDL) cholesterol less than 70 mg/dL 11/03/2013  . ICH (intracerebral hemorrhage) ([place]) 07/18/2013   PCP:  Maryella Shivers, MD Pharmacy:   [facility], [place] [address] Phone: [phone] Fax: [place], [place] [address] Phone: [phone] Fax: [phone]     Social Determinants of Health (SDOH) Interventions    Readmission Risk Interventions No flowsheet data found.

## 2019-09-21 NOTE — Progress Notes (Signed)
Pt confused and agitated attempted to get oob--presumably to to use toilet. Pt had very large bowel movement. Pt used his teeth to pull off his safety mitten and and chewed through his cortrak feeding tube at the level of the bridal clamp and pulled off his condom cath.   Pt cleaned up and remainder of cortrak removed. Will continue to assess and ensure patient safety

## 2019-09-21 NOTE — Progress Notes (Signed)
OT Cancellation  Pt scheduled for surgery tomorrow. Will assess after surgery. Thanks Maurie Boettcher, OT/L   Acute OT Clinical Specialist Acute Rehabilitation Services Pager [phone] Office [phone]

## 2019-09-21 NOTE — Progress Notes (Signed)
Subjective:    Encephalopathic  Now getting FWF witn enteral nutrition  SNa 140, SCr stable 5.17  1.3L UOP yesterday  Objective Vital signs in last 24 hours: Vitals:   09/21/19 0800 09/21/19 0900 09/21/19 1000 09/21/19 1100  BP: 120/65 (!) 153/67 122/74 124/61  Pulse: (!) 58 65 61 65  Resp: 20 (!) 23 20 (!) 25  Temp:    98 F (36.7 C)  TempSrc:    Axillary  SpO2: 97% 96% 93% (!) 88%  Weight:      Height:       Weight change:   Intake/Output Summary (Last 24 hours) at 09/21/2019 1131 Last data filed at 09/21/2019 1000 Gross per 24 hour  Intake 3019.39 ml  Output 1020 ml  Net 1999.39 ml    Assessment/Plan: 63 year old WM with DM, HTN and vascualr dz s/p BKA- unclear level of functioning or level of renal function prior to admit 1.Renal- A on CRF vs AKI in the setting of mild rhabdo after fall and prolonged time in one position.  SCr was around 4 in 2018 in care everywhere.  So might b e nearing a baseline.  Cont hydration as things are improving.   2. Hypertension/volume  - BP stable 3. Hyperkalmia-  resolved.  4. Anemia  -  s/p blood transfusion -   iron stores OK  on ESA. Hb 9s CTM 5.  Hypernatremia-  Resolved, cont FWF titrate to normal SNa  No further suggestions at ths time. Will s/o but arrange close outpt f/u at [facility] office.  Please call with any questions or concerns.   Rexene Agent    Labs: Basic Metabolic Panel: Recent Labs  Lab 09/19/19 0630 09/20/19 0530 09/21/19 0334  NA 150* 145 140  K 3.5 3.2* 3.1*  CL 109 104 103  CO2 '29 28 28  ' GLUCOSE 212* 250* 308*  BUN 88* 89* 84*  CREATININE 5.09* 5.02* 5.17*  CALCIUM 7.8* 7.1* 6.8*  PHOS 4.1 3.7 3.9   Liver Function Tests: Recent Labs  Lab 09/16/19 0345 09/19/19 0630 09/20/19 0530 09/21/19 0334  AST 30  --  17  --   ALT 21  --  17  --   ALKPHOS 46  --  49  --   BILITOT 0.9  --  0.5  --   PROT 4.3*  --  4.4*  --   ALBUMIN 2.0* 2.1* 1.9*  1.9* 1.7*   No results for input(s): LIPASE,  AMYLASE in the last 168 hours. No results for input(s): AMMONIA in the last 168 hours. CBC: Recent Labs  Lab 09/17/19 0317 09/18/19 0353 09/19/19 0630 09/20/19 0530 09/21/19 0334  WBC 6.4 10.4 10.4 10.5 11.1*  NEUTROABS  --  8.6* 8.4* 8.0*  --   HGB 7.4* 8.3* 9.6* 8.6* 7.8*  HCT 22.1* 25.6* 30.4* 27.2* 24.2*  MCV 92.1 94.5 94.7 96.1 96.0  PLT 168 150 146* 123* 79*   Cardiac Enzymes: Recent Labs  Lab 09/15/19 1822 09/16/19 0345 09/19/19 0630  CKTOTAL 1,452* 945* 298   CBG: Recent Labs  Lab 09/20/19 1952 09/20/19 2327 09/21/19 0331 09/21/19 0727 09/21/19 1126  GLUCAP 210* 221* 279* 268* 231*    Iron Studies:  No results for input(s): IRON, TIBC, TRANSFERRIN, FERRITIN in the last 72 hours. Studies/Results: DG Swallowing Func-Speech Pathology  Result Date: 09/20/2019 Objective Swallowing Evaluation: Type of Study: MBS-Modified Barium Swallow Study  Patient Details Name: Ryan Villa MRN: [id number] Date of Birth: 17-Dec-1955 Today's Date: 09/20/2019 Time: SLP  Start Time (ACUTE ONLY): 1200 -SLP Stop Time (ACUTE ONLY): 1230 SLP Time Calculation (min) (ACUTE ONLY): 30 min Past Medical History: No past medical history on file. HPI: 63 year old male transferred from [facility] for acute respiratory failure, rhabdomyolysis (fell and laid on floor 3 days), acute kidney injury. PMH: CKD stage IV, diabetes mellitus type 2, hyperlipidemia, hypertension, left carotid stenosis, intracerebral hemorrhage, status post right BKA. Per chart EMS reported that the patient's sister was bringing him food while he was lying on the floor during that 3-day period. Intubated 12/23-12/24 (self extubated). Found to have right humeral neck fracture and right upper lobe consolidation per chest x-ray, head CT no acute intracranial process,  Subjective: alert Assessment / Plan / Recommendation CHL IP CLINICAL IMPRESSIONS 09/20/2019 Clinical Impression Pt presents with a moderate-severe dysphagia at this  time, marked by poor timing and inconsistent closure of the laryngeal vestibule, leading to silent aspiration of nectar and honey thick liquids.  Pt eventually began coughing at the end of the study, but aspiration did not lead to any protective sensory response immediately after the swallow.  There was diffuse residue in the valleculae and pyriform spaces that would accumulate, and then partially clear during subsequent swallows.  Pt had   difficulty following instructions for protective postures.  Purees appeared to be the safest consistency - however, pt's motor response was so inconsistent and, given his absent sensation to aspiration, I would be hesitant to continue a pureed diet. For now, recommend NPO; allow meds crushed in puree. Consider cortrak.  SLP will follow for pharyngeal timing/strengthening.  Anticipate improvements as mental status improves. D/W RN and Secure Messaged Dr. Manuella Ghazi with results. SLP Visit Diagnosis Dysphagia, pharyngeal phase (R13.13) Attention and concentration deficit following -- Frontal lobe and executive function deficit following -- Impact on safety and function Moderate aspiration risk   CHL IP TREATMENT RECOMMENDATION 09/20/2019 Treatment Recommendations Therapy as outlined in treatment plan below   Prognosis 09/20/2019 Prognosis for Safe Diet Advancement Good Barriers to Reach Goals Cognitive deficits Barriers/Prognosis Comment -- CHL IP DIET RECOMMENDATION 09/20/2019 SLP Diet Recommendations NPO except meds Liquid Administration via -- Medication Administration Crushed with puree Compensations -- Postural Changes --   CHL IP OTHER RECOMMENDATIONS 09/20/2019 Recommended Consults -- Oral Care Recommendations Oral care QID Other Recommendations --   CHL IP FOLLOW UP RECOMMENDATIONS 09/19/2019 Follow up Recommendations Other (comment)   CHL IP FREQUENCY AND DURATION 09/20/2019 Speech Therapy Frequency (ACUTE ONLY) min 2x/week Treatment Duration 2 weeks      CHL IP ORAL PHASE  09/20/2019 Oral Phase WFL Oral - Pudding Teaspoon -- Oral - Pudding Cup -- Oral - Honey Teaspoon -- Oral - Honey Cup -- Oral - Nectar Teaspoon -- Oral - Nectar Cup -- Oral - Nectar Straw -- Oral - Thin Teaspoon -- Oral - Thin Cup -- Oral - Thin Straw -- Oral - Puree -- Oral - Mech Soft -- Oral - Regular -- Oral - Multi-Consistency -- Oral - Pill -- Oral Phase - Comment --  CHL IP PHARYNGEAL PHASE 09/20/2019 Pharyngeal Phase Impaired Pharyngeal- Pudding Teaspoon -- Pharyngeal -- Pharyngeal- Pudding Cup -- Pharyngeal -- Pharyngeal- Honey Teaspoon Delayed swallow initiation-vallecula;Delayed swallow initiation-pyriform sinuses;Reduced airway/laryngeal closure;Reduced tongue base retraction;Penetration/Aspiration before swallow;Penetration/Aspiration during swallow;Moderate aspiration;Pharyngeal residue - valleculae;Pharyngeal residue - pyriform Pharyngeal Material enters airway, passes BELOW cords without attempt by patient to eject out (silent aspiration) Pharyngeal- Honey Cup -- Pharyngeal -- Pharyngeal- Nectar Teaspoon Delayed swallow initiation-vallecula;Delayed swallow initiation-pyriform sinuses;Reduced airway/laryngeal closure;Reduced tongue base retraction;Penetration/Aspiration before swallow;Penetration/Aspiration  during swallow;Moderate aspiration;Pharyngeal residue - valleculae;Pharyngeal residue - pyriform Pharyngeal Material enters airway, passes BELOW cords without attempt by patient to eject out (silent aspiration) Pharyngeal- Nectar Cup Delayed swallow initiation-vallecula;Delayed swallow initiation-pyriform sinuses;Reduced airway/laryngeal closure;Reduced tongue base retraction;Penetration/Aspiration before swallow;Penetration/Aspiration during swallow;Moderate aspiration;Pharyngeal residue - valleculae;Pharyngeal residue - pyriform Pharyngeal Material enters airway, passes BELOW cords without attempt by patient to eject out (silent aspiration) Pharyngeal- Nectar Straw -- Pharyngeal -- Pharyngeal-  Thin Teaspoon -- Pharyngeal -- Pharyngeal- Thin Cup -- Pharyngeal -- Pharyngeal- Thin Straw -- Pharyngeal -- Pharyngeal- Puree Delayed swallow initiation-vallecula;Reduced airway/laryngeal closure;Reduced tongue base retraction;Pharyngeal residue - valleculae;Pharyngeal residue - pyriform Pharyngeal Material does not enter airway Pharyngeal- Mechanical Soft -- Pharyngeal -- Pharyngeal- Regular -- Pharyngeal -- Pharyngeal- Multi-consistency -- Pharyngeal -- Pharyngeal- Pill -- Pharyngeal -- Pharyngeal Comment --  No flowsheet data found. Juan Quam Laurice 09/20/2019, 12:45 PM              Medications: Infusions: . sodium chloride Stopped (09/20/19 0821)  . cefTRIAXone (ROCEPHIN)  IV Stopped (09/20/19 1729)  . dextrose 100 mL/hr at 09/21/19 1000  . feeding supplement (OSMOLITE 1.2 CAL) 50 mL/hr at 09/21/19 1000    Scheduled Medications: . sodium chloride   Intravenous Once  . amLODipine  10 mg Per Tube Daily  . chlorhexidine gluconate (MEDLINE KIT)  15 mL Mouth Rinse BID  . Chlorhexidine Gluconate Cloth  6 each Topical Daily  . darbepoetin (ARANESP) injection - NON-DIALYSIS  100 mcg Subcutaneous Q Thu-1800  . feeding supplement (PRO-STAT SUGAR FREE 64)  30 mL Per Tube TID  . free water  300 mL Per Tube Q6H  . heparin  5,000 Units Subcutaneous Q8H  . insulin aspart  0-9 Units Subcutaneous Q4H  . mupirocin ointment   Nasal BID  . pantoprazole sodium  40 mg Per Tube QHS    have reviewed scheduled and prn medications.  Physical Exam: General: extubated -  Appears alert, interactive this AMHeart: RRR Lungs: clear Abdomen: soft, non tender Extremities: no peripheral edema-  Right arm bruised   09/21/2019,11:31 AM  LOS: 6 days

## 2019-09-21 NOTE — Consult Note (Signed)
ORTHOPAEDIC Consult  REQUESTING PHYSICIAN: Rodena Goldmann, DO  PCP:  Maryella Shivers, MD  Chief Complaint: Right proximal humerus fracture  HPI: Ryan Villa is a 63 y.o. male who complains of right shoulder pain.  He has a recent history of ICU admission due to being found down at his normal place of residency.  He currently is still somewhat confused but is oriented to place but not time or situation.  Per discussion with consultants and the ICU team appears that he had a ground-level fall at home.  He was found down 3 days prior to presentation at [facility] emergency department.  He was noted to be in stage IV kidney failure as well as exacerbation of his pre-existing diabetes.  He was transferred to [facility] to continue his work-up.  He was in respiratory failure at that time and also dealing with multiple metabolic abnormalities related to the kidney failure.  He has since been found to have a comminuted and displaced proximal humerus fracture.  He has been able to be medically optimized in the ICU and extubated.  I have been consulted for management of his proximal humerus fracture.  No past medical history on file.  Social History   Socioeconomic History  . Marital status: Divorced    Spouse name: Not on file  . Number of children: Not on file  . Years of education: Not on file  . Highest education level: Not on file  Occupational History  . Not on file  Tobacco Use  . Smoking status: Unknown If Ever Smoked  Substance and Sexual Activity  . Alcohol use: Not on file  . Drug use: Not on file  . Sexual activity: Not on file  Other Topics Concern  . Not on file  Social History Narrative  . Not on file   Social Determinants of Health   Financial Resource Strain:   . Difficulty of Paying Living Expenses: Not on file  Food Insecurity:   . Worried About Charity fundraiser in the Last Year: Not on file  . Ran Out of Food in the Last Year: Not on file  Transportation  Needs:   . Lack of Transportation (Medical): Not on file  . Lack of Transportation (Non-Medical): Not on file  Physical Activity:   . Days of Exercise per Week: Not on file  . Minutes of Exercise per Session: Not on file  Stress:   . Feeling of Stress : Not on file  Social Connections:   . Frequency of Communication with Friends and Family: Not on file  . Frequency of Social Gatherings with Friends and Family: Not on file  . Attends Religious Services: Not on file  . Active Member of Clubs or Organizations: Not on file  . Attends Archivist Meetings: Not on file  . Marital Status: Not on file   No family history on file. No Known Allergies Prior to Admission medications   Medication Sig Start Date End Date Taking? Authorizing Provider  amLODipine (NORVASC) 10 MG tablet Take 10 mg by mouth. 07/28/13  Yes [provider]  atorvastatin (LIPITOR) 40 MG tablet Take 40 mg by mouth daily. 06/14/19  Yes [provider]  carvedilol (COREG) 6.25 MG tablet Take 6.25 mg by mouth. 07/28/13  Yes [provider]  clopidogrel (PLAVIX) 75 MG tablet Take 75 mg by mouth daily. 06/14/19  Yes [provider]  ezetimibe (ZETIA) 10 MG tablet Take 10 mg by mouth daily. 06/14/19  Yes [provider]  hydrALAZINE (APRESOLINE) 50 MG tablet Take 50 mg by mouth 2 (two) times daily. 06/14/19  Yes [provider]  pantoprazole (PROTONIX) 40 MG tablet Take 40 mg by mouth daily. 06/14/19  Yes [provider]  sodium bicarbonate 650 MG tablet Take 650 mg by mouth 3 (three) times daily. 09/13/16  Yes [provider]   DG Swallowing Func-Speech Pathology  Result Date: 09/20/2019 Objective Swallowing Evaluation: Type of Study: MBS-Modified Barium Swallow Study  Patient Details Name: Ryan Villa MRN: [id number] Date of Birth: 1955-12-29 Today's Date: 09/20/2019 Time: SLP Start Time (ACUTE ONLY): 1200 -SLP Stop Time (ACUTE ONLY): 1230 SLP Time  Calculation (min) (ACUTE ONLY): 30 min Past Medical History: No past medical history on file. HPI: 63 year old male transferred from [facility] for acute respiratory failure, rhabdomyolysis (fell and laid on floor 3 days), acute kidney injury. PMH: CKD stage IV, diabetes mellitus type 2, hyperlipidemia, hypertension, left carotid stenosis, intracerebral hemorrhage, status post right BKA. Per chart EMS reported that the patient's sister was bringing him food while he was lying on the floor during that 3-day period. Intubated 12/23-12/24 (self extubated). Found to have right humeral neck fracture and right upper lobe consolidation per chest x-ray, head CT no acute intracranial process,  Subjective: alert Assessment / Plan / Recommendation CHL IP CLINICAL IMPRESSIONS 09/20/2019 Clinical Impression Pt presents with a moderate-severe dysphagia at this time, marked by poor timing and inconsistent closure of the laryngeal vestibule, leading to silent aspiration of nectar and honey thick liquids.  Pt eventually began coughing at the end of the study, but aspiration did not lead to any protective sensory response immediately after the swallow.  There was diffuse residue in the valleculae and pyriform spaces that would accumulate, and then partially clear during subsequent swallows.  Pt had   difficulty following instructions for protective postures.  Purees appeared to be the safest consistency - however, pt's motor response was so inconsistent and, given his absent sensation to aspiration, I would be hesitant to continue a pureed diet. For now, recommend NPO; allow meds crushed in puree. Consider cortrak.  SLP will follow for pharyngeal timing/strengthening.  Anticipate improvements as mental status improves. D/W RN and Secure Messaged Dr. Manuella Ghazi with results. SLP Visit Diagnosis Dysphagia, pharyngeal phase (R13.13) Attention and concentration deficit following -- Frontal lobe and executive function deficit following  -- Impact on safety and function Moderate aspiration risk   CHL IP TREATMENT RECOMMENDATION 09/20/2019 Treatment Recommendations Therapy as outlined in treatment plan below   Prognosis 09/20/2019 Prognosis for Safe Diet Advancement Good Barriers to Reach Goals Cognitive deficits Barriers/Prognosis Comment -- CHL IP DIET RECOMMENDATION 09/20/2019 SLP Diet Recommendations NPO except meds Liquid Administration via -- Medication Administration Crushed with puree Compensations -- Postural Changes --   CHL IP OTHER RECOMMENDATIONS 09/20/2019 Recommended Consults -- Oral Care Recommendations Oral care QID Other Recommendations --   CHL IP FOLLOW UP RECOMMENDATIONS 09/19/2019 Follow up Recommendations Other (comment)   CHL IP FREQUENCY AND DURATION 09/20/2019 Speech Therapy Frequency (ACUTE ONLY) min 2x/week Treatment Duration 2 weeks      CHL IP ORAL PHASE 09/20/2019 Oral Phase WFL Oral - Pudding Teaspoon -- Oral - Pudding Cup -- Oral - Honey Teaspoon -- Oral - Honey Cup -- Oral - Nectar Teaspoon -- Oral - Nectar Cup -- Oral - Nectar Straw -- Oral - Thin Teaspoon -- Oral - Thin Cup -- Oral - Thin Straw -- Oral - Puree -- Oral - Mech Soft --  Oral - Regular -- Oral - Multi-Consistency -- Oral - Pill -- Oral Phase - Comment --  CHL IP PHARYNGEAL PHASE 09/20/2019 Pharyngeal Phase Impaired Pharyngeal- Pudding Teaspoon -- Pharyngeal -- Pharyngeal- Pudding Cup -- Pharyngeal -- Pharyngeal- Honey Teaspoon Delayed swallow initiation-vallecula;Delayed swallow initiation-pyriform sinuses;Reduced airway/laryngeal closure;Reduced tongue base retraction;Penetration/Aspiration before swallow;Penetration/Aspiration during swallow;Moderate aspiration;Pharyngeal residue - valleculae;Pharyngeal residue - pyriform Pharyngeal Material enters airway, passes BELOW cords without attempt by patient to eject out (silent aspiration) Pharyngeal- Honey Cup -- Pharyngeal -- Pharyngeal- Nectar Teaspoon Delayed swallow initiation-vallecula;Delayed swallow  initiation-pyriform sinuses;Reduced airway/laryngeal closure;Reduced tongue base retraction;Penetration/Aspiration before swallow;Penetration/Aspiration during swallow;Moderate aspiration;Pharyngeal residue - valleculae;Pharyngeal residue - pyriform Pharyngeal Material enters airway, passes BELOW cords without attempt by patient to eject out (silent aspiration) Pharyngeal- Nectar Cup Delayed swallow initiation-vallecula;Delayed swallow initiation-pyriform sinuses;Reduced airway/laryngeal closure;Reduced tongue base retraction;Penetration/Aspiration before swallow;Penetration/Aspiration during swallow;Moderate aspiration;Pharyngeal residue - valleculae;Pharyngeal residue - pyriform Pharyngeal Material enters airway, passes BELOW cords without attempt by patient to eject out (silent aspiration) Pharyngeal- Nectar Straw -- Pharyngeal -- Pharyngeal- Thin Teaspoon -- Pharyngeal -- Pharyngeal- Thin Cup -- Pharyngeal -- Pharyngeal- Thin Straw -- Pharyngeal -- Pharyngeal- Puree Delayed swallow initiation-vallecula;Reduced airway/laryngeal closure;Reduced tongue base retraction;Pharyngeal residue - valleculae;Pharyngeal residue - pyriform Pharyngeal Material does not enter airway Pharyngeal- Mechanical Soft -- Pharyngeal -- Pharyngeal- Regular -- Pharyngeal -- Pharyngeal- Multi-consistency -- Pharyngeal -- Pharyngeal- Pill -- Pharyngeal -- Pharyngeal Comment --  No flowsheet data found. Juan Quam Laurice 09/20/2019, 12:45 PM               Positive ROS: All other systems have been reviewed and were otherwise negative with the exception of those mentioned in the HPI and as above.  Physical Exam: General: Oriented to place but not situation or time. Cardiovascular: No pedal edema Respiratory: On 6 L nasal cannula GI: No organomegaly, abdomen is soft and non-tender Skin: Large lymphedema noted in the right upper extremity with hematoma from fracture.  He has some superficial skin tears noted along the forearm.   Distally good 2+ pulse with motor intact. Neurologic: Sensation intact distally Psychiatric: Patient is competent for consent with normal mood and affect Lymphatic: No axillary or cervical lymphadenopathy  MUSCULOSKELETAL:  Right upper extremity is in a sling.  He has bruising throughout the right upper extremity as well as moderate edema.   Assessment: 1. Four part right proximal humerus fracture  Plan: - Will need operative management of right shoulder.  This is not fixable and will need arthroplasty -At this time he is not currently in a mental state for self consent.  We did go ahead and reviewed the risk and benefits of this surgery which will be a reverse shoulder arthroplasty.  -We will obtain consent from his mother who is his next of kin.  -He is currently on NG tube feeds, we will have these turned off at 5 AM this morning in preparation for surgery tomorrow afternoon around 4 PM.    Nicholes Stairs, MD Cell [phone]    09/21/2019 4:15 PM

## 2019-09-21 NOTE — Progress Notes (Signed)
PROGRESS NOTE    Ryan Villa  NLZ:[id number] DOB: 20-Aug-1956 DOA: 09/15/2019 PCP: Maryella Shivers, MD   Brief Narrative:  This is a 63 year old male past medical history CKD stage IV, diabetes mellitus type 2, hyperlipidemia, hypertension, left carotid stenosis, intracerebral hemorrhage, status post right BKA presented to [facility] emergency department after apparently falling at his home 3 days ago, apparently lying on the floor since that time. EMS reported that the patient'ssister was bringing him food while he was lying on the floor during that 3-day period.He is status post recent right BKA and uses a prosthetic and is supposed to use a walker but is noncompliant with his per family. Reportedly not using his walker when he fell and landed on his right side. Unclear if there was loss of consciousness. At that time the patient did not want his sister to call 911. On 12/23 she called 911 because she noticed that he was having a hard time breathing andbruising on his right shoulder. On arrival to [facility] ED the patient was able to answer questions with difficulty in understanding his speech. He washypoxic on room air and placed on nonrebreather, noted to appear severely ill appearingand tachypneic. He was alsonoted to be covered in dried stool/urine. He complained of right shoulder pain. Work-up and findings significant for hypothermia with a temperature of 93.9 F, significant ecchymosis over the right shoulder area, hypotensive, given IV fluids. He progressed to have worsening respiratory distress, hypotension, became unresponsive and subsequently intubated, central line placed. He had significant metabolic derangements including sodium 149, potassium 6.8, BUN 172, creatinine10.5.CK 2411. Procalcitonin 0.61.UDS negative. pH less than 7 per ABG report. He responded fairly well to IV fluids andbrieflyrequiredpressor support. Repeat labs showed minimal improvement in metabolic  derangements. He had some urine output, milky white in color. Noted right humeral neck fracture on x-ray per ER paper chart,right upper lobe consolidation per chest x-ray, head CT no acute intracranial process,Diffuse parasinusinflammation.  Per critical care attending Dr. Nelda Marseille patient was tested negative for Covid at [facility].  12/28: Patient still confused and trying to get out of bed. Currently on 6L [place] and producing urine with stable renal function noted. Continue D5 IVF. Ortho plans for shoulder surgery by 12/30. SLP evaluation ordered for dysphagia.  12/29: Patient is quite somnolent this morning, but arousable.  He is currently on 2 L nasal cannula and appears to be producing urine output in his Foley bag.  Appreciate further nephrology recommendations.  We will plan to replete potassium and magnesium today.  Patient has had SLP evaluation yesterday and has failed.  Cortrack placed for nutrition.  Orthopedics plans for further evaluation today with potential shoulder surgery 12/30.  Assessment & Plan:   Principal Problem:   Closed fracture of right proximal humerus Active Problems:   Acute respiratory failure (HCC)   AKI (acute kidney injury) (HCC)   Rhabdomyolysis   Acute renal failure (ARF) (HCC)   Pressure injury of skin   Protein-calorie malnutrition, severe   Acute respiratory failure with hypoxiaon presentation to [facility] [employer] -He was intubated and transferred to [facility] ICU -Patient self extubated on December 25,he remains on room air no hypoxia at rest. -Severe dysphagia for which he is on NG tube for nutrition. -Currently on 2L [place]  Lobar pneumonia,right -Per Dr. Joelyn Oms was tested negative for Covid at [facility] -No sputum culture obtained -Blood culture negative -He did spike fever 101.1 on December 25,no fever last 24 hours -He has been treated with Rocephin and Zithromax  since admission,today is day 6 of antibiotic  treatment  UTI -Urine culture on admissionispositive for E. Coli,sensitive to Rocephin -He is treated with Rocephin since admission  Hypernatremia -On D5 infusion,improving -Appreciate nephrology input with plans to continue for now  AKI on CKD -Unknown baseline -BUN 170 creatinine 9.68 on presentation,improving,BUN 88 creatinine 5.17 today -1273m urine output last 24 hours -Nephrology following,input appreciated with recommendations to continue D5 -BMP am  Rhabdomyolysis -Resolved  Normocytic anemia-stable -tibili unremarkable, he does notAppear to have any overt blood loss,likely underlying anemia of chronic disease -Status post 1 piece RBC transfusion on December 23,getting EPO injection prerenal -monitor hgb, will check FOBt which is pending  Hypertension-stable Norvasc  Displaced right surgical neck humerus fracture ortho Dr XErlinda Hongrecommended leave in shoulder sling,will eventually need shoulder replacementwhenmedically stable  Right hand edema -Elevate right upper extremity,venous Doppler to rule out DVT  History of right BKA,stable  Elevated blood glucose on D5 -I do not see any diabetes medication on his home med rec list -A1c obtained on December 23 is 5.4,however A1c test is not reliable in the setting of AKI and anemia -A1c 5.7% -Continue SSI  AcuteMetabolic encephalopathy/delirium -Initial CT head no acute findings,multiple remote lacunar strokes seen CT scan -He does have metabolic derangement whichisimproving -Frequent reorientation -Transfer out of ICU  H/o CVA: LDL low Hold lipitor Restart plavix if fobt negative   Hypokalemia/hypomagnesemia -Replete and reevaluate in a.m.   DVT prophylaxis:Heparin Code Status: Full Family Communication: Patient Disposition Plan: Plan for shoulder surgery by 12/30 per ortho Dr.Rogers. Continue IVF and monitor labs. Wean O2.  Continue tube feeding nutrition for  now.   Consultants:   Nephrology  Orthopedics  Procedures:   [facility] self extubated on December 25  NGplacement,and pulled out NG  Right IJ placement on December 23  NG tube placement 12/28  Antimicrobials:  Anti-infectives (From admission, onward)   Start     Dose/Rate Route Frequency Ordered Stop   09/15/19 1715  azithromycin (ZITHROMAX) 500 mg in sodium chloride 0.9 % 250 mL IVPB  Status:  Discontinued     500 mg 250 mL/hr over 60 Minutes Intravenous Every 24 hours 09/15/19 1711 09/18/19 1107   09/15/19 1715  cefTRIAXone (ROCEPHIN) 1 g in sodium chloride 0.9 % 100 mL IVPB     1 g 200 mL/hr over 30 Minutes Intravenous Every 24 hours 09/15/19 1711 09/22/19 1714       Subjective: Patient seen and evaluated today with no new acute complaints or concerns. No acute concerns or events noted overnight.  He is currently somewhat somnolent.  He is maintaining good urine output.  Objective: Vitals:   09/21/19 0600 09/21/19 0700 09/21/19 0751 09/21/19 0800  BP: 127/66 (!) 120/51 (!) 120/51 120/65  Pulse: 62 62 (!) 59 (!) 58  Resp: (!) '21 19 20 20  ' Temp:  98.1 F (36.7 C)    TempSrc:  Axillary    SpO2: 93% 94% 95% 97%  Weight:      Height:        Intake/Output Summary (Last 24 hours) at 09/21/2019 0850 Last data filed at 09/21/2019 0800 Gross per 24 hour  Intake 2940.31 ml  Output 1265 ml  Net 1675.31 ml   Filed Weights   09/16/19 1124 09/18/19 0500 09/21/19 0407  Weight: 68.5 kg 69.7 kg 69.3 kg    Examination:  General exam: Appears somnolent Respiratory system: Clear to auscultation. Respiratory effort normal.  2 L nasal cannula oxygen Cardiovascular system:  S1 & S2 heard, RRR. No JVD, murmurs, rubs, gallops or clicks. No pedal edema. Gastrointestinal system: Abdomen is nondistended, soft and nontender. No organomegaly or masses felt. Normal bowel sounds heard. Central nervous system: Somnolent but arousable Extremities: No  edema Skin: No rashes, lesions or ulcers Psychiatry: Cannot be assessed    Data Reviewed: I have personally reviewed following labs and imaging studies  CBC: Recent Labs  Lab 09/15/19 1822 09/17/19 0317 09/17/19 2353 09/18/19 0353 09/19/19 0630 09/20/19 0530 09/21/19 0334  WBC 4.6 6.4  --  10.4 10.4 10.5 11.1*  NEUTROABS 3.9  --   --  8.6* 8.4* 8.0*  --   HGB 6.8* 7.4* 7.5* 8.3* 9.6* 8.6* 7.8*  HCT 21.3* 22.1* 22.0* 25.6* 30.4* 27.2* 24.2*  MCV 94.2 92.1  --  94.5 94.7 96.1 96.0  PLT 190 168  --  150 146* 123* 79*   Basic Metabolic Panel: Recent Labs  Lab 09/16/19 0345 09/16/19 0354 09/17/19 0317 09/17/19 1309 09/17/19 2353 09/18/19 0353 09/19/19 0630 09/20/19 0530 09/21/19 0334  NA 153*  --  156* 154* 152* 155* 150* 145 140  K 4.6  --  3.4* 3.5 3.4* 3.4* 3.5 3.2* 3.1*  CL 121*   < > 110 113*  --  113* 109 104 103  CO2 15*   < > 31 30  --  '31 29 28 28  ' GLUCOSE 213*   < > 243* 174*  --  157* 212* 250* 308*  BUN 162*   < > 127* 116*  --  105* 88* 89* 84*  CREATININE 8.66*   < > 6.98* 6.44*  --  5.87* 5.09* 5.02* 5.17*  CALCIUM 7.0*   < > 6.9* 6.6*  --  7.0* 7.8* 7.1* 6.8*  MG 2.1  --  1.8  --   --  1.7 1.7  --  1.4*  PHOS 7.2*  --  5.1*  --   --  4.7* 4.1 3.7 3.9   < > = values in this interval not displayed.   GFR: Estimated Creatinine Clearance: 14.1 mL/min (A) (by C-G formula based on SCr of 5.17 mg/dL (H)). Liver Function Tests: Recent Labs  Lab 09/16/19 0345 09/17/19 0317 09/18/19 0353 09/19/19 0630 09/20/19 0530 09/21/19 0334  AST 30  --   --   --  17  --   ALT 21  --   --   --  17  --   ALKPHOS 46  --   --   --  49  --   BILITOT 0.9  --   --   --  0.5  --   PROT 4.3*  --   --   --  4.4*  --   ALBUMIN 2.0* 2.0* 2.0* 2.1* 1.9*  1.9* 1.7*   No results for input(s): LIPASE, AMYLASE in the last 168 hours. No results for input(s): AMMONIA in the last 168 hours. Coagulation Profile: No results for input(s): INR, PROTIME in the last 168  hours. Cardiac Enzymes: Recent Labs  Lab 09/15/19 1822 09/16/19 0345 09/19/19 0630  CKTOTAL 1,452* 945* 298   BNP (last 3 results) No results for input(s): PROBNP in the last 8760 hours. HbA1C: Recent Labs    09/20/19 0530  HGBA1C 5.7*   CBG: Recent Labs  Lab 09/20/19 1521 09/20/19 1952 09/20/19 2327 09/21/19 0331 09/21/19 0727  GLUCAP 212* 210* 221* 279* 268*   Lipid Profile: Recent Labs    09/20/19 0530  CHOL 92  HDL 30*  LDLCALC  39  TRIG 117  CHOLHDL 3.1   Thyroid Function Tests: No results for input(s): TSH, T4TOTAL, FREET4, T3FREE, THYROIDAB in the last 72 hours. Anemia Panel: No results for input(s): VITAMINB12, FOLATE, FERRITIN, TIBC, IRON, RETICCTPCT in the last 72 hours. Sepsis Labs: Recent Labs  Lab 09/15/19 1817 09/15/19 1822 09/15/19 2050  PROCALCITON  --  2.92  --   LATICACIDVEN 1.2  --  1.3    Recent Results (from the past 240 hour(s))  MRSA PCR Screening     Status: Abnormal   Collection Time: 09/15/19  4:41 PM   Specimen: Nasal Mucosa; Nasopharyngeal  Result Value Ref Range Status   MRSA by PCR POSITIVE (A) NEGATIVE Final    Comment:        The GeneXpert MRSA Assay (FDA approved for NASAL specimens only), is one component of a comprehensive MRSA colonization surveillance program. It is not intended to diagnose MRSA infection nor to guide or monitor treatment for MRSA infections. RESULT CALLED TO, READ BACK BY AND VERIFIED WITH: D [facility] RN 09/15/19 2120 JDW Performed at [facility] [address] [place], [place] [address]   Urine culture     Status: Abnormal   Collection Time: 09/15/19  4:47 PM   Specimen: Urine, Random  Result Value Ref Range Status   Specimen Description URINE, RANDOM  Final   Special Requests   Final    NONE Performed at [facility], [place] [address], [place], [place] [address]    Culture 80,000 COLONIES/mL ESCHERICHIA COLI (A)  Final   Report Status 09/18/2019 FINAL  Final   Organism  ID, Bacteria ESCHERICHIA COLI (A)  Final      Susceptibility   Escherichia coli - MIC*    AMPICILLIN >=32 RESISTANT Resistant     CEFAZOLIN 8 SENSITIVE Sensitive     CEFTRIAXONE <=1 SENSITIVE Sensitive     CIPROFLOXACIN <=0.25 SENSITIVE Sensitive     GENTAMICIN <=1 SENSITIVE Sensitive     IMIPENEM <=0.25 SENSITIVE Sensitive     NITROFURANTOIN <=16 SENSITIVE Sensitive     TRIMETH/SULFA <=20 SENSITIVE Sensitive     AMPICILLIN/SULBACTAM 16 INTERMEDIATE Intermediate     PIP/TAZO 8 SENSITIVE Sensitive     * 80,000 COLONIES/mL ESCHERICHIA COLI  Culture, blood (routine x 2)     Status: None   Collection Time: 09/15/19  6:12 PM   Specimen: BLOOD LEFT FOREARM  Result Value Ref Range Status   Specimen Description BLOOD LEFT FOREARM  Final   Special Requests   Final    BOTTLES DRAWN AEROBIC AND ANAEROBIC Blood Culture results may not be optimal due to an inadequate volume of blood received in culture bottles   Culture   Final    NO GROWTH 5 DAYS Performed at [facility], [place] [address], [place], [place] [address]    Report Status 09/20/2019 FINAL  Final  Culture, blood (routine x 2)     Status: None   Collection Time: 09/15/19  6:17 PM   Specimen: BLOOD LEFT HAND  Result Value Ref Range Status   Specimen Description BLOOD LEFT HAND  Final   Special Requests   Final    BOTTLES DRAWN AEROBIC AND ANAEROBIC Blood Culture results may not be optimal due to an inadequate volume of blood received in culture bottles   Culture   Final    NO GROWTH 5 DAYS Performed at [facility], [place] [address] [place], [place] [address]    Report  Status 09/20/2019 FINAL  Final  SARS CORONAVIRUS 2 (TAT 6-24 HRS) Nasopharyngeal Nasopharyngeal Swab     Status: None   Collection Time: 09/20/19  5:01 PM   Specimen: Nasopharyngeal Swab  Result Value Ref Range Status   SARS Coronavirus 2 NEGATIVE NEGATIVE Final    Comment: (NOTE) SARS-CoV-2 target nucleic acids are NOT DETECTED. The SARS-CoV-2 RNA is  generally detectable in upper and lower respiratory specimens during the acute phase of infection. Negative results do not preclude SARS-CoV-2 infection, do not rule out co-infections with other pathogens, and should not be used as the sole basis for treatment or other patient management decisions. Negative results must be combined with clinical observations, patient history, and epidemiological information. The expected result is Negative. Fact Sheet for Patients: SugarRoll.be Fact Sheet for Healthcare Providers: https://www.woods-mathews.com/ This test is not yet approved or cleared by the [place] FDA and  has been authorized for detection and/or diagnosis of SARS-CoV-2 by FDA under an Emergency Use Authorization (EUA). This EUA will remain  in effect (meaning this test can be used) for the duration of the COVID-19 declaration under Section 56 4(b)(1) of the Act, 21 U.S.C. section 360bbb-3(b)(1), unless the authorization is terminated or revoked sooner. Performed at [facility], [place] [address], [place], [place] [address]          Radiology Studies: DG Swallowing Func-Speech Pathology  Result Date: 09/20/2019 Objective Swallowing Evaluation: Type of Study: MBS-Modified Barium Swallow Study  Patient Details Name: Ruford Dudzinski MRN: [id number] Date of Birth: 20-Oct-1955 Today's Date: 09/20/2019 Time: SLP Start Time (ACUTE ONLY): 1200 -SLP Stop Time (ACUTE ONLY): 1230 SLP Time Calculation (min) (ACUTE ONLY): 30 min Past Medical History: No past medical history on file. HPI: 63 year old male transferred from [facility] for acute respiratory failure, rhabdomyolysis (fell and laid on floor 3 days), acute kidney injury. PMH: CKD stage IV, diabetes mellitus type 2, hyperlipidemia, hypertension, left carotid stenosis, intracerebral hemorrhage, status post right BKA. Per chart EMS reported that the patient's sister was bringing him food  while he was lying on the floor during that 3-day period. Intubated 12/23-12/24 (self extubated). Found to have right humeral neck fracture and right upper lobe consolidation per chest x-ray, head CT no acute intracranial process,  Subjective: alert Assessment / Plan / Recommendation CHL IP CLINICAL IMPRESSIONS 09/20/2019 Clinical Impression Pt presents with a moderate-severe dysphagia at this time, marked by poor timing and inconsistent closure of the laryngeal vestibule, leading to silent aspiration of nectar and honey thick liquids.  Pt eventually began coughing at the end of the study, but aspiration did not lead to any protective sensory response immediately after the swallow.  There was diffuse residue in the valleculae and pyriform spaces that would accumulate, and then partially clear during subsequent swallows.  Pt had   difficulty following instructions for protective postures.  Purees appeared to be the safest consistency - however, pt's motor response was so inconsistent and, given his absent sensation to aspiration, I would be hesitant to continue a pureed diet. For now, recommend NPO; allow meds crushed in puree. Consider cortrak.  SLP will follow for pharyngeal timing/strengthening.  Anticipate improvements as mental status improves. D/W RN and Secure Messaged Dr. Manuella Ghazi with results. SLP Visit Diagnosis Dysphagia, pharyngeal phase (R13.13) Attention and concentration deficit following -- Frontal lobe and executive function deficit following -- Impact on safety and function Moderate aspiration risk   CHL IP TREATMENT RECOMMENDATION 09/20/2019 Treatment Recommendations Therapy as outlined in treatment plan below  Prognosis 09/20/2019 Prognosis for Safe Diet Advancement Good Barriers to Reach Goals Cognitive deficits Barriers/Prognosis Comment -- CHL IP DIET RECOMMENDATION 09/20/2019 SLP Diet Recommendations NPO except meds Liquid Administration via -- Medication Administration Crushed with puree  Compensations -- Postural Changes --   CHL IP OTHER RECOMMENDATIONS 09/20/2019 Recommended Consults -- Oral Care Recommendations Oral care QID Other Recommendations --   CHL IP FOLLOW UP RECOMMENDATIONS 09/19/2019 Follow up Recommendations Other (comment)   CHL IP FREQUENCY AND DURATION 09/20/2019 Speech Therapy Frequency (ACUTE ONLY) min 2x/week Treatment Duration 2 weeks      CHL IP ORAL PHASE 09/20/2019 Oral Phase WFL Oral - Pudding Teaspoon -- Oral - Pudding Cup -- Oral - Honey Teaspoon -- Oral - Honey Cup -- Oral - Nectar Teaspoon -- Oral - Nectar Cup -- Oral - Nectar Straw -- Oral - Thin Teaspoon -- Oral - Thin Cup -- Oral - Thin Straw -- Oral - Puree -- Oral - Mech Soft -- Oral - Regular -- Oral - Multi-Consistency -- Oral - Pill -- Oral Phase - Comment --  CHL IP PHARYNGEAL PHASE 09/20/2019 Pharyngeal Phase Impaired Pharyngeal- Pudding Teaspoon -- Pharyngeal -- Pharyngeal- Pudding Cup -- Pharyngeal -- Pharyngeal- Honey Teaspoon Delayed swallow initiation-vallecula;Delayed swallow initiation-pyriform sinuses;Reduced airway/laryngeal closure;Reduced tongue base retraction;Penetration/Aspiration before swallow;Penetration/Aspiration during swallow;Moderate aspiration;Pharyngeal residue - valleculae;Pharyngeal residue - pyriform Pharyngeal Material enters airway, passes BELOW cords without attempt by patient to eject out (silent aspiration) Pharyngeal- Honey Cup -- Pharyngeal -- Pharyngeal- Nectar Teaspoon Delayed swallow initiation-vallecula;Delayed swallow initiation-pyriform sinuses;Reduced airway/laryngeal closure;Reduced tongue base retraction;Penetration/Aspiration before swallow;Penetration/Aspiration during swallow;Moderate aspiration;Pharyngeal residue - valleculae;Pharyngeal residue - pyriform Pharyngeal Material enters airway, passes BELOW cords without attempt by patient to eject out (silent aspiration) Pharyngeal- Nectar Cup Delayed swallow initiation-vallecula;Delayed swallow initiation-pyriform  sinuses;Reduced airway/laryngeal closure;Reduced tongue base retraction;Penetration/Aspiration before swallow;Penetration/Aspiration during swallow;Moderate aspiration;Pharyngeal residue - valleculae;Pharyngeal residue - pyriform Pharyngeal Material enters airway, passes BELOW cords without attempt by patient to eject out (silent aspiration) Pharyngeal- Nectar Straw -- Pharyngeal -- Pharyngeal- Thin Teaspoon -- Pharyngeal -- Pharyngeal- Thin Cup -- Pharyngeal -- Pharyngeal- Thin Straw -- Pharyngeal -- Pharyngeal- Puree Delayed swallow initiation-vallecula;Reduced airway/laryngeal closure;Reduced tongue base retraction;Pharyngeal residue - valleculae;Pharyngeal residue - pyriform Pharyngeal Material does not enter airway Pharyngeal- Mechanical Soft -- Pharyngeal -- Pharyngeal- Regular -- Pharyngeal -- Pharyngeal- Multi-consistency -- Pharyngeal -- Pharyngeal- Pill -- Pharyngeal -- Pharyngeal Comment --  No flowsheet data found. Juan Quam Laurice 09/20/2019, 12:45 PM              VAS [place] UPPER EXTREMITY VENOUS DUPLEX  Result Date: 09/19/2019 UPPER VENOUS STUDY  Indications: Swelling, and s/p fall with right proximal humerus fracture Limitations: Poor ultrasound/tissue interface and line in neck, arm in sling, restricted mobility. Comparison Study: No prior study. Performing Technologist: Maudry Mayhew MHA, RDMS, RVT, RDCS  Examination Guidelines: A complete evaluation includes B-mode imaging, spectral Doppler, color Doppler, and power Doppler as needed of all accessible portions of each vessel. Bilateral testing is considered an integral part of a complete examination. Limited examinations for reoccurring indications may be performed as noted.  Right Findings: +----------+------------+---------+-----------+----------+--------------+ RIGHT     CompressiblePhasicitySpontaneousProperties   Summary     +----------+------------+---------+-----------+----------+--------------+ IJV                                                  Not visualized +----------+------------+---------+-----------+----------+--------------+ Subclavian  Not visualized +----------+------------+---------+-----------+----------+--------------+ Axillary                 Yes       Yes                             +----------+------------+---------+-----------+----------+--------------+ Brachial      Full       Yes       Yes                             +----------+------------+---------+-----------+----------+--------------+ Radial        Full                                                 +----------+------------+---------+-----------+----------+--------------+ Ulnar                                               Not visualized +----------+------------+---------+-----------+----------+--------------+ Cephalic      Full                                                 +----------+------------+---------+-----------+----------+--------------+ Basilic                                             Not visualized +----------+------------+---------+-----------+----------+--------------+  Left Findings: +----------+------------+---------+-----------+----------+--------------+ LEFT      CompressiblePhasicitySpontaneousProperties   Summary     +----------+------------+---------+-----------+----------+--------------+ Subclavian                                          Not visualized +----------+------------+---------+-----------+----------+--------------+  Summary:  Right: No evidence of deep vein thrombosis in the upper extremity. No evidence of superficial vein thrombosis in the upper extremity.  *See table(s) above for measurements and observations.  Diagnosing physician: Monica Martinez MD Electronically signed by Monica Martinez MD on 09/19/2019 at 1:47:07 PM.    Final         Scheduled Meds: . sodium chloride   Intravenous Once  .  amLODipine  10 mg Per Tube Daily  . chlorhexidine gluconate (MEDLINE KIT)  15 mL Mouth Rinse BID  . Chlorhexidine Gluconate Cloth  6 each Topical Daily  . darbepoetin (ARANESP) injection - NON-DIALYSIS  100 mcg Subcutaneous Q Thu-1800  . feeding supplement (PRO-STAT SUGAR FREE 64)  30 mL Per Tube TID  . free water  300 mL Per Tube Q6H  . heparin  5,000 Units Subcutaneous Q8H  . insulin aspart  0-9 Units Subcutaneous Q4H  . mupirocin ointment   Nasal BID  . pantoprazole sodium  40 mg Per Tube QHS   Continuous Infusions: . sodium chloride Stopped (09/20/19 0821)  . cefTRIAXone (ROCEPHIN)  IV Stopped (09/20/19 1729)  . dextrose 100 mL/hr at 09/21/19 0800  . feeding supplement (OSMOLITE 1.2 CAL) 50 mL/hr at 09/21/19 0800  . magnesium sulfate  bolus IVPB 2 g (09/21/19 0838)     LOS: 6 days    Time spent: 30 minutes    Izzy Doubek Darleen Crocker, DO [facility] Pager [phone]  If 7PM-7AM, please contact night-coverage [email or web address] Password [email or web address] 09/21/2019, 8:50 AM

## 2019-09-21 NOTE — Progress Notes (Signed)
eLink Physician-Brief Progress Note Patient Name: Ryan Villa DOB: 24-Jun-1956 MRN: [id number]   Date of Service  09/21/2019  HPI/Events of Note  Pt pulled out and bit his Cortrack tube in half, he needs left wrist restraint orders.  eICU Interventions  Left wrist restraint ordered.        Kerry Kass Ogan 09/21/2019, 9:01 PM

## 2019-09-22 ENCOUNTER — Inpatient Hospital Stay (HOSPITAL_COMMUNITY): Payer: Medicare Other

## 2019-09-22 DIAGNOSIS — J189 Pneumonia, unspecified organism: Secondary | ICD-10-CM

## 2019-09-22 DIAGNOSIS — N184 Chronic kidney disease, stage 4 (severe): Secondary | ICD-10-CM

## 2019-09-22 LAB — DIC (DISSEMINATED INTRAVASCULAR COAGULATION)PANEL
D-Dimer, Quant: 3.47 ug/mL-FEU — ABNORMAL HIGH (ref 0.00–0.50)
Fibrinogen: 381 mg/dL (ref 210–475)
INR: 1 (ref 0.8–1.2)
Platelets: 57 10*3/uL — ABNORMAL LOW (ref 150–400)
Prothrombin Time: 13 seconds (ref 11.4–15.2)
Smear Review: NONE SEEN
aPTT: 26 seconds (ref 24–36)

## 2019-09-22 LAB — GLUCOSE, CAPILLARY
Glucose-Capillary: 143 mg/dL — ABNORMAL HIGH (ref 70–99)
Glucose-Capillary: 132 mg/dL — ABNORMAL HIGH (ref 70–99)
Glucose-Capillary: 125 mg/dL — ABNORMAL HIGH (ref 70–99)
Glucose-Capillary: 118 mg/dL — ABNORMAL HIGH (ref 70–99)
Glucose-Capillary: 192 mg/dL — ABNORMAL HIGH (ref 70–99)
Glucose-Capillary: 203 mg/dL — ABNORMAL HIGH (ref 70–99)

## 2019-09-22 LAB — CBC
HCT: 23.7 % — ABNORMAL LOW (ref 39.0–52.0)
Hemoglobin: 7.6 g/dL — ABNORMAL LOW (ref 13.0–17.0)
MCH: 30.6 pg (ref 26.0–34.0)
MCHC: 32.1 g/dL (ref 30.0–36.0)
MCV: 95.6 fL (ref 80.0–100.0)
Platelets: 58 10*3/uL — ABNORMAL LOW (ref 150–400)
RBC: 2.48 MIL/uL — ABNORMAL LOW (ref 4.22–5.81)
RDW: 13.3 % (ref 11.5–15.5)
WBC: 10.9 10*3/uL — ABNORMAL HIGH (ref 4.0–10.5)
nRBC: 0 % (ref 0.0–0.2)

## 2019-09-22 LAB — TYPE AND SCREEN
ABO/RH(D): O POS
Antibody Screen: NEGATIVE

## 2019-09-22 LAB — RENAL FUNCTION PANEL
Albumin: 1.7 g/dL — ABNORMAL LOW (ref 3.5–5.0)
Anion gap: 10 (ref 5–15)
BUN: 86 mg/dL — ABNORMAL HIGH (ref 8–23)
CO2: 26 mmol/L (ref 22–32)
Calcium: 6.9 mg/dL — ABNORMAL LOW (ref 8.9–10.3)
Chloride: 106 mmol/L (ref 98–111)
Creatinine, Ser: 5.12 mg/dL — ABNORMAL HIGH (ref 0.61–1.24)
GFR calc Af Amer: 13 mL/min — ABNORMAL LOW (ref >60)
GFR calc non Af Amer: 11 mL/min — ABNORMAL LOW (ref >60)
Glucose, Bld: 130 mg/dL — ABNORMAL HIGH (ref 70–99)
Phosphorus: 4 mg/dL (ref 2.5–4.6)
Potassium: 3.5 mmol/L (ref 3.5–5.1)
Sodium: 142 mmol/L (ref 135–145)

## 2019-09-22 LAB — MAGNESIUM: Magnesium: 2.2 mg/dL (ref 1.7–2.4)

## 2019-09-22 MED ORDER — CEFAZOLIN SODIUM-DEXTROSE 2-4 GM/100ML-% IV SOLN
2.0000 g | INTRAVENOUS | Status: AC
  Administered 2019-09-28: 2 g via INTRAVENOUS
  Filled 2019-09-22 (×2): qty 100

## 2019-09-22 MED ORDER — TRANEXAMIC ACID-NACL 1000-0.7 MG/100ML-% IV SOLN
1000.0000 mg | INTRAVENOUS | Status: AC
  Filled 2019-09-22: qty 100

## 2019-09-22 MED ORDER — CEFAZOLIN SODIUM-DEXTROSE 2-4 GM/100ML-% IV SOLN
2.0000 g | INTRAVENOUS | Status: DC
  Filled 2019-09-22: qty 100

## 2019-09-22 NOTE — Progress Notes (Signed)
Due to ongoing thrombus cytopenia and concerns for skin tears on the operative extremity we will delay his right shoulder reverse arthroplasty until next week on Tuesday.  This will be performed on 10 her the fifth that around 12:30 PM.

## 2019-09-22 NOTE — Progress Notes (Signed)
Physical Therapy Treatment Patient Details Name: Ryan Villa MRN: [id number] DOB: 1956-01-23 Today's Date: 09/22/2019    History of Present Illness Pt adm with rt humeral fx after fall at home and in the floor x 3 days. Pt intubated 12/23 and self extubated 12/25. Pt with acute metabolic encephalopathy and delirium, PMH - rt bka, ckd, dm, htn, iintracerebral hemorrhage    PT Comments    Pt seen with OT today to advance OOB mobility. Pt initially very lethargic however upon sitting EOB pt more alert and engaged with PT/OT and participate in therapy. Per chart pt now not going for surgery until 1/5 for ORIF of R humerus. Pt remains R UE NWB and doesn't have R LE prosthesis, complete squat pvt transfer to drop arm recliner today. Acute PT to cont to follow.    Follow Up Recommendations  SNF;Supervision/Assistance - 24 hour     Equipment Recommendations  None recommended by PT    Recommendations for Other Services       Precautions / Restrictions Precautions Precautions: Fall Precaution Comments: R proximal humerus fracture - plan for OR on 1/5 Required Braces or Orthoses: Sling Restrictions Weight Bearing Restrictions: Yes RUE Weight Bearing: Non weight bearing    Mobility  Bed Mobility Overal bed mobility: Needs Assistance Bed Mobility: Supine to Sit     Supine to sit: +2 for safety/equipment;Max assist;+2 for physical assistance     General bed mobility comments: patient requires support for LE and trunk to EOB, cueing to maintain NWB R UE with poor initation of tasks   Transfers Overall transfer level: Needs assistance Equipment used: (2 person lift with gait belt and bed pad) Transfers: Squat Pivot Transfers     Squat pivot transfers: Max assist;+2 physical assistance    Lateral/Scoot Transfers: Max assist;+2 physical assistance;+2 safety/equipment General transfer comment: pt unable to use R UE due to fracture and is a R BKA, therefore required maxA from PT/OT  to WB on L LE and pvt over to drop arm recliner  Ambulation/Gait             General Gait Details: pt unable to amb at this time due to R UE NWB and no R LE prosthesis present   Stairs             Wheelchair Mobility    Modified Rankin (Stroke Patients Only)       Balance Overall balance assessment: Needs assistance Sitting-balance support: Single extremity supported;No upper extremity supported;Feet supported Sitting balance-Leahy Scale: Fair Sitting balance - Comments: min guard to close supervision                                    Cognition Arousal/Alertness: Awake/alert(initally lethargic but improved with mobility) Behavior During Therapy: Flat affect Overall Cognitive Status: Impaired/Different from baseline Area of Impairment: Orientation;Attention;Memory;Following commands;Safety/judgement;Awareness;Problem solving                 Orientation Level: Disoriented to;Time;Situation Current Attention Level: Sustained Memory: Decreased recall of precautions;Decreased short-term memory Following Commands: Follows one step commands inconsistently;Follows one step commands with increased time Safety/Judgement: Decreased awareness of safety;Decreased awareness of deficits Awareness: Intellectual Problem Solving: Slow processing;Decreased initiation;Difficulty sequencing;Requires verbal cues;Requires tactile cues General Comments: pt initially lethargic and sleeping upon arrival, upon stimulation and sitting up EOB pt more alert and awake, pt more engaging in coversation and not agitated. Pt able to answer questions and was re-oriented  to place adn situation and was able to retain it      Exercises General Exercises - Lower Extremity Long Arc Quad: AROM;Left;5 reps;Seated Hip Flexion/Marching: AROM;Both;10 reps;Seated    General Comments General comments (skin integrity, edema, etc.): VSS, on O2      Pertinent Vitals/Pain Pain  Assessment: Faces Faces Pain Scale: Hurts a little bit Pain Location: rt shoulder/arm Pain Descriptors / Indicators: Grimacing;Guarding Pain Intervention(s): Monitored during session    Home Living Family/patient expects to be discharged to:: Private residence Living Arrangements: Parent Available Help at Discharge: Family;Available 24 hours/day       Home Layout: One level Home Equipment: Walker - 2 wheels;Wheelchair - Brewing technologist      Prior Function Level of Independence: Needs assistance  Gait / Transfers Assistance Needed: pt reports using RW and prosthesis for mobility PTA  ADL's / Homemaking Assistance Needed: independent ADLs, mother does IADLs, does not drive     PT Goals (current goals can now be found in the care plan section) Acute Rehab PT Goals Patient Stated Goal: not stated Progress towards PT goals: Progressing toward goals    Frequency    Min 2X/week      PT Plan Current plan remains appropriate    Co-evaluation PT/OT/SLP Co-Evaluation/Treatment: Yes Reason for Co-Treatment: Complexity of the patient's impairments (multi-system involvement) PT goals addressed during session: Mobility/safety with mobility OT goals addressed during session: ADL's and self-care      AM-PAC PT "6 Clicks" Mobility   Outcome Measure  Help needed turning from your back to your side while in a flat bed without using bedrails?: A Little Help needed moving from lying on your back to sitting on the side of a flat bed without using bedrails?: A Lot Help needed moving to and from a bed to a chair (including a wheelchair)?: Total Help needed standing up from a chair using your arms (e.g., wheelchair or bedside chair)?: Total Help needed to walk in hospital room?: Total Help needed climbing 3-5 steps with a railing? : Total 6 Click Score: 9    End of Session Equipment Utilized During Treatment: Oxygen Activity Tolerance: Patient tolerated treatment well Patient left:  with call bell/phone within reach;in chair;with chair alarm set Nurse Communication: Mobility status PT Visit Diagnosis: Other abnormalities of gait and mobility (R26.89);Muscle weakness (generalized) (M62.81);Pain Pain - Right/Left: Right Pain - part of body: Shoulder     Time: [id number] PT Time Calculation (min) (ACUTE ONLY): 28 min  Charges:  $Therapeutic Activity: 8-22 mins                     Kittie Plater, PT, DPT Acute Rehabilitation Services Pager #: [phone] Office #: [phone]    Berline Lopes 09/22/2019, 1:12 PM

## 2019-09-22 NOTE — Evaluation (Signed)
Occupational Therapy Evaluation Patient Details Name: Ryan Villa MRN: [id number] DOB: 07/02/1956 Today's Date: 09/22/2019    History of Present Illness Pt adm with rt humeral fx after fall at home and in the floor x 3 days. Pt intubated 12/23 and self extubated 12/25. Pt with acute metabolic encephalopathy and delirium, PMH - rt bka, ckd, dm, htn, iintracerebral hemorrhage   Clinical Impression   PTA patient reports independent with ADLs, using RW/prosthetic for mobility; does not drive or complete IADLs. Admitted for above and limited by problem list below, including impaired cognition, R UE pain/edema/decreased functional use, impaired balance, generalized weakness and decreased activity tolerance. Patient currently requires max assist +2 for transfers, mod-max assist for UB ADLs, total assist for LB ADLs, and mod assist for grooming.  He is oriented x 2, follows 1 step commands inconsistently with increased time, presenting with poor attention, memory and awareness. Surgery for R humerus has been moved back at this time. He will benefit from continued OT services acutely and after dc at SNF level in order to optimize independence and return to PLOF with ADls, mobility.     Follow Up Recommendations  SNF;Supervision/Assistance - 24 hour    Equipment Recommendations  Other (comment)(TBD at next venue of care)    Recommendations for Other Services Other (comment)(palliative care)     Precautions / Restrictions Precautions Precautions: Fall Required Braces or Orthoses: Sling Restrictions Weight Bearing Restrictions: Yes RUE Weight Bearing: Non weight bearing      Mobility Bed Mobility Overal bed mobility: Needs Assistance Bed Mobility: Supine to Sit     Supine to sit: +2 for safety/equipment;Max assist;+2 for physical assistance     General bed mobility comments: patient requires support for LE and trunk to EOB, cueing to maintain NWB R UE with poor initation of tasks    Transfers Overall transfer level: Needs assistance   Transfers: Lateral/Scoot Transfers          Lateral/Scoot Transfers: Max assist;+2 physical assistance;+2 safety/equipment General transfer comment: lateral scoot towards L side with max assist +2 with cueing for technique and hand placement, poor initation and attention to task     Balance Overall balance assessment: Needs assistance Sitting-balance support: Single extremity supported;No upper extremity supported;Feet supported Sitting balance-Leahy Scale: Fair Sitting balance - Comments: min guard to close supervision                                   ADL either performed or assessed with clinical judgement   ADL Overall ADL's : Needs assistance/impaired     Grooming: Moderate assistance;Sitting   Upper Body Bathing: Maximal assistance;Sitting   Lower Body Bathing: Total assistance;+2 for physical assistance;Sitting/lateral leans   Upper Body Dressing : Sitting;Total assistance   Lower Body Dressing: Total assistance;+2 for physical assistance;Sitting/lateral leans   Toilet Transfer: Maximal assistance;+2 for physical assistance;+2 for safety/equipment Toilet Transfer Details (indicate cue type and reason): lateral scoot to recliner         Functional mobility during ADLs: Maximal assistance;+2 for physical assistance;+2 for safety/equipment General ADL Comments: pt limited by dominant R UE fx and NWB, impaired cognition, decreaed activity tolerance, generalized weakness and impaired balance     Vision   Vision Assessment?: No apparent visual deficits     Perception     Praxis      Pertinent Vitals/Pain Pain Assessment: Faces Faces Pain Scale: Hurts a little bit Pain Location: rt shoulder/arm  Pain Descriptors / Indicators: Grimacing;Guarding Pain Intervention(s): Monitored during session;Repositioned;Limited activity within patient's tolerance     Hand Dominance Right    Extremity/Trunk Assessment Upper Extremity Assessment Upper Extremity Assessment: Generalized weakness;RUE deficits/detail RUE Deficits / Details: humerus fx, in sling; edema  RUE: Unable to fully assess due to immobilization RUE Sensation: WNL RUE Coordination: decreased fine motor;decreased gross motor   Lower Extremity Assessment Lower Extremity Assessment: Defer to PT evaluation(R BKA)       Communication Communication Communication: Expressive difficulties(low volume and difficult to understand)   Cognition Arousal/Alertness: Awake/alert(initally lethargic but improved with mobility) Behavior During Therapy: Flat affect Overall Cognitive Status: Impaired/Different from baseline Area of Impairment: Orientation;Attention;Memory;Following commands;Safety/judgement;Awareness;Problem solving                 Orientation Level: Disoriented to;Time;Situation Current Attention Level: Sustained Memory: Decreased recall of precautions;Decreased short-term memory Following Commands: Follows one step commands inconsistently;Follows one step commands with increased time Safety/Judgement: Decreased awareness of safety;Decreased awareness of deficits Awareness: Intellectual Problem Solving: Slow processing;Decreased initiation;Difficulty sequencing;Requires verbal cues;Requires tactile cues General Comments: pt oriented to self and place (hospital), able to recall year after reorientation, follows simple commands inconsistently with increased time; poor awareness to situation    General Comments  VSS, 4L ; educated patient on NWB and sling use of R UE, elevated UE on pillows; informed RN of need for larger sling     Exercises     Shoulder Instructions      Home Living Family/patient expects to be discharged to:: Private residence Living Arrangements: Parent Available Help at Discharge: Family;Available 24 hours/day         Home Layout: One level     Bathroom Shower/Tub:  Occupational psychologist: Standard     Home Equipment: Environmental consultant - 2 wheels;Wheelchair - manual;Shower seat          Prior Functioning/Environment Level of Independence: Needs assistance  Gait / Transfers Assistance Needed: pt reports using RW and prosthesis for mobility PTA  ADL's / Homemaking Assistance Needed: independent ADLs, mother does IADLs, does not drive            OT Problem List: Decreased activity tolerance;Impaired balance (sitting and/or standing);Pain;Impaired UE functional use;Decreased knowledge of precautions;Decreased knowledge of use of DME or AE;Decreased safety awareness;Decreased cognition;Decreased coordination;Decreased range of motion;Decreased strength;Increased edema      OT Treatment/Interventions: Self-care/ADL training;Therapeutic exercise;DME and/or AE instruction;Therapeutic activities;Cognitive remediation/compensation;Patient/family education;Balance training    OT Goals(Current goals can be found in the care plan section) Acute Rehab OT Goals Patient Stated Goal: not stated OT Goal Formulation: With patient Time For Goal Achievement: 10/06/19 Potential to Achieve Goals: Good  OT Frequency: Min 2X/week   Barriers to D/C:            Co-evaluation PT/OT/SLP Co-Evaluation/Treatment: Yes Reason for Co-Treatment: Complexity of the patient's impairments (multi-system involvement)   OT goals addressed during session: ADL's and self-care      AM-PAC OT "6 Clicks" Daily Activity     Outcome Measure Help from another person eating meals?: Total Help from another person taking care of personal grooming?: A Lot Help from another person toileting, which includes using toliet, bedpan, or urinal?: Total Help from another person bathing (including washing, rinsing, drying)?: A Lot Help from another person to put on and taking off regular upper body clothing?: A Lot Help from another person to put on and taking off regular lower body  clothing?: Total 6 Click Score: 9   End  of Session Equipment Utilized During Treatment: Gait belt;Other (comment);Oxygen(sling. 4L ) Nurse Communication: Mobility status;Other (comment)(needs a larger sling )  Activity Tolerance: Patient tolerated treatment well Patient left: in chair;with call bell/phone within reach;Other (comment)(RN getting chair alarm box, but posey belt placed)  OT Visit Diagnosis: Other abnormalities of gait and mobility (R26.89);Muscle weakness (generalized) (M62.81);History of falling (Z91.81);Pain;Other symptoms and signs involving cognitive function Pain - Right/Left: Right Pain - part of body: Shoulder;Arm                Time: [id number] OT Time Calculation (min): 31 min Charges:  OT General Charges $OT Visit: 1 Visit OT Evaluation $OT Eval Moderate Complexity: 1 Mod  Jolaine Artist, OT Acute Rehabilitation Services Pager [phone] Office [phone]   Delight Stare 09/22/2019, 11:56 AM

## 2019-09-22 NOTE — Progress Notes (Signed)
Patient ID: Ryan Villa, male   DOB: 21-Nov-1955, 63 y.o.   MRN: [id number]  PROGRESS NOTE    Ryan Villa  BJS:[id number] DOB: 07-28-1956 DOA: 09/15/2019 PCP: Maryella Shivers, MD   Brief Narrative:  63 year old 88 with history of chronic renal disease stage IV, diabetes mellitus type 2, hyperlipidemia, hypertension, left carotid stenosis, intracerebral hemorrhage, status post right BKA presented to [facility] ER after a fall at home 3 days prior to presentation.  He was found to have slurred speech along with hypoxia requiring nonrebreather and significant ecchymosis over the right shoulder area.  He subsequently became more hypoxic and required intubation.  Creatinine was 10.5.  CK 2411.  Procalcitonin 0.61.  UDS negative.  He responded fairly well to IV fluids and briefly required pressor support.  He was found to have right humeral neck fracture on x-ray per ER paper chart, right upper lobe consolidation per chest x-ray, CT of the head had no acute intracranial process, diffuse paraspinous inflammation.  As per PCCM, patient was tested negative for Covid at [facility].  He was admitted under PCCM service, started on broad-spectrum antibiotics.  Nephrology and orthopedics were consulted.  He subsequently self extubated at night on 09/17/2019.  Creatinine improved with IV fluids and neurology signed off.  He was transferred to [facility] service on 09/19/2019.  Assessment & Plan:   Acute hypoxic respiratory failure -Intubated on admission and self extubated on 12/20 at night.  Transferred to [facility] service on 09/19/2019.  PCCM is signed off. -Currently on 4 L oxygen via nasal cannula.  Wean off as able.  Incentive spirometry. -Diet as per SLP recommendations.  Patient pulled out his cortrak  Right lower pneumonia -Covid test was negative at [facility] -Blood cultures negative so far. -Patient received 7 days of IV Rocephin, last dose on 09/01/2019 and also received 3 days of oral  Zithromax. -Currently afebrile -Monitor off antibiotics.  E. coli UTI -Has completed treatment with Rocephin  Displaced right humerus surgical neck  Fracture -Orthopedics following.  Spoke to Dr. Jinny Sanders on phone on 09/22/2019 and surgical intervention will be postponed till next Tuesday given worsening thrombocytopenia and skin tear on the right upper extremity.  Right upper extremity edema -Probably secondary to humerus fracture.  Venous Doppler was negative for DVT  Thrombocytopenia -Worsening.  Platelets 58 today.  Most likely from consumption.  DC heparin.  Check HIT panel and DIC panel.  If platelets drop further, consider hematology evaluation  Hypernatremia -Much improved.  Monitor  AKI on probable CKD stage IV -Creatinine was around 4 in 2018 in care everywhere -Creatinine 5.12 today.  Nephrology has signed off.  Monitor.  Strict input and output.  Anemia of chronic disease -Hemoglobin 7.6 today.  Status post 1 unit packed red cell transfusion on 09/12/2019.  Monitor  Hypertension  -Stable.  Continue Norvasc  Acute metabolic encephalopathy/delirium -Initial CT head with no acute findings, multiple remote lacunar strokes seen on CAT scan -Monitor mental status.  Frequent reorientation.  Fall precautions.  History of CVA -LDL low.  Lipitor on hold.  Restart Plavix once able to tolerate orally.  Generalized conditioning -Overall prognosis is guarded to poor.  Will request palliative care evaluation for goals of care discussion    DVT prophylaxis: DC heparin Code Status: Full Family Communication: None at bedside Disposition Plan: We will remain inpatient for further work-up and awaiting orthopedic intervention  Consultants: PCCM/nephrology/orthopedics Procedures: Intubation on presentation and self extubation on 09/17/2019 Right IJ central line placement on 09/15/2019  Antimicrobials:  Anti-infectives (From admission, onward)   Start     Dose/Rate  Route Frequency Ordered Stop   09/28/19 0000  ceFAZolin (ANCEF) IVPB 2g/100 mL premix    Note to Pharmacy: Surgery delayed until 1/5.   2 g 200 mL/hr over 30 Minutes Intravenous To Surgery 09/22/19 0904 09/29/19 0000   09/22/19 0815  ceFAZolin (ANCEF) IVPB 2g/100 mL premix  Status:  Discontinued     2 g 200 mL/hr over 30 Minutes Intravenous To Surgery 09/22/19 0806 09/22/19 0905   09/15/19 1715  azithromycin (ZITHROMAX) 500 mg in sodium chloride 0.9 % 250 mL IVPB  Status:  Discontinued     500 mg 250 mL/hr over 60 Minutes Intravenous Every 24 hours 09/15/19 1711 09/18/19 1107   09/15/19 1715  cefTRIAXone (ROCEPHIN) 1 g in sodium chloride 0.9 % 100 mL IVPB     1 g 200 mL/hr over 30 Minutes Intravenous Every 24 hours 09/15/19 1711 09/21/19 1754       Subjective: Patient seen and examined at bedside.  He is awake but hardly answers any questions.  Looks confused.  No overnight fever, vomiting, bleeding reported.  Objective: Vitals:   09/22/19 0700 09/22/19 0800 09/22/19 0803 09/22/19 0900  BP: (!) 143/71 (!) 153/74  130/71  Pulse: (!) 57 (!) 58 60 (!) 55  Resp: 19 (!) '21 16 17  ' Temp: 98.3 F (36.8 C)     TempSrc: Axillary     SpO2: 96% 94% 96% 95%  Weight:      Height:        Intake/Output Summary (Last 24 hours) at 09/22/2019 0949 Last data filed at 09/22/2019 0400 Gross per 24 hour  Intake 1559.96 ml  Output 1080 ml  Net 479.96 ml   Filed Weights   09/18/19 0500 09/21/19 0407 09/22/19 0500  Weight: 69.7 kg 69.3 kg 70 kg    Examination:  General exam: Looks chronically ill and older than stated age.  No distress.  Awake but confused  respiratory system: Bilateral decreased breath sounds at bases with some scattered crackles Cardiovascular system: S1 & S2 heard, mild intermittent bradycardia Gastrointestinal system: Abdomen is nondistended, soft and nontender. Normal bowel sounds heard. Extremities: No cyanosis, clubbing; trace lower extremity edema present.  Right  BKA present.  Right upper extremity swelling present Central nervous system: Awake but confused. No focal neurological deficits. Moving extremities Skin: No rashes, lesions or ulcers Psychiatry: Could not be assessed because of mental status.    Data Reviewed: I have personally reviewed following labs and imaging studies  CBC: Recent Labs  Lab 09/15/19 1822 09/18/19 0353 09/19/19 0630 09/20/19 0530 09/21/19 0334 09/22/19 0443  WBC 4.6 10.4 10.4 10.5 11.1* 10.9*  NEUTROABS 3.9 8.6* 8.4* 8.0*  --   --   HGB 6.8* 8.3* 9.6* 8.6* 7.8* 7.6*  HCT 21.3* 25.6* 30.4* 27.2* 24.2* 23.7*  MCV 94.2 94.5 94.7 96.1 96.0 95.6  PLT 190 150 146* 123* 79* 58*   Basic Metabolic Panel: Recent Labs  Lab 09/17/19 0317 09/17/19 0347 09/18/19 0353 09/19/19 0630 09/20/19 0530 09/21/19 0334 09/22/19 0442  NA 156*  --  155* 150* 145 140 142  K 3.4*  --  3.4* 3.5 3.2* 3.1* 3.5  CL 110   < > 113* 109 104 103 106  CO2 31   < > '31 29 28 28 26  ' GLUCOSE 243*   < > 157* 212* 250* 308* 130*  BUN 127*   < > 105* 88* 89* 84* 86*  CREATININE 6.98*   < > 5.87* 5.09* 5.02* 5.17* 5.12*  CALCIUM 6.9*   < > 7.0* 7.8* 7.1* 6.8* 6.9*  MG 1.8  --  1.7 1.7  --  1.4* 2.2  PHOS 5.1*  --  4.7* 4.1 3.7 3.9 4.0   < > = values in this interval not displayed.   GFR: Estimated Creatinine Clearance: 14.3 mL/min (A) (by C-G formula based on SCr of 5.12 mg/dL (H)). Liver Function Tests: Recent Labs  Lab 09/16/19 0345 09/18/19 0353 09/19/19 0630 09/20/19 0530 09/21/19 0334 09/22/19 0442  AST 30  --   --  17  --   --   ALT 21  --   --  17  --   --   ALKPHOS 46  --   --  49  --   --   BILITOT 0.9  --   --  0.5  --   --   PROT 4.3*  --   --  4.4*  --   --   ALBUMIN 2.0* 2.0* 2.1* 1.9*  1.9* 1.7* 1.7*   No results for input(s): LIPASE, AMYLASE in the last 168 hours. No results for input(s): AMMONIA in the last 168 hours. Coagulation Profile: No results for input(s): INR, PROTIME in the last 168 hours. Cardiac  Enzymes: Recent Labs  Lab 09/15/19 1822 09/16/19 0345 09/19/19 0630  CKTOTAL 1,452* 945* 298   BNP (last 3 results) No results for input(s): PROBNP in the last 8760 hours. HbA1C: Recent Labs    09/20/19 0530  HGBA1C 5.7*   CBG: Recent Labs  Lab 09/21/19 1526 09/21/19 2040 09/21/19 2325 09/22/19 0334 09/22/19 0737  GLUCAP 276* 184* 162* 143* 132*   Lipid Profile: Recent Labs    09/20/19 0530  CHOL 92  HDL 30*  LDLCALC 39  TRIG 117  CHOLHDL 3.1   Thyroid Function Tests: No results for input(s): TSH, T4TOTAL, FREET4, T3FREE, THYROIDAB in the last 72 hours. Anemia Panel: No results for input(s): VITAMINB12, FOLATE, FERRITIN, TIBC, IRON, RETICCTPCT in the last 72 hours. Sepsis Labs: Recent Labs  Lab 09/15/19 1817 09/15/19 1822 09/15/19 2050  PROCALCITON  --  2.92  --   LATICACIDVEN 1.2  --  1.3    Recent Results (from the past 240 hour(s))  MRSA PCR Screening     Status: Abnormal   Collection Time: 09/15/19  4:41 PM   Specimen: Nasal Mucosa; Nasopharyngeal  Result Value Ref Range Status   MRSA by PCR POSITIVE (A) NEGATIVE Final    Comment:        The GeneXpert MRSA Assay (FDA approved for NASAL specimens only), is one component of a comprehensive MRSA colonization surveillance program. It is not intended to diagnose MRSA infection nor to guide or monitor treatment for MRSA infections. RESULT CALLED TO, READ BACK BY AND VERIFIED WITH: D [facility] RN 09/15/19 2120 JDW Performed at [place] [address] [place], [place] [address]   Urine culture     Status: Abnormal   Collection Time: 09/15/19  4:47 PM   Specimen: Urine, Random  Result Value Ref Range Status   Specimen Description URINE, RANDOM  Final   Special Requests   Final    NONE Performed at [facility], [place] [address], [place], [place] [address]    Culture 80,000 COLONIES/mL ESCHERICHIA COLI (A)  Final   Report Status 09/18/2019 FINAL  Final   Organism ID, Bacteria  ESCHERICHIA COLI (A)  Final  Susceptibility   Escherichia coli - MIC*    AMPICILLIN >=32 RESISTANT Resistant     CEFAZOLIN 8 SENSITIVE Sensitive     CEFTRIAXONE <=1 SENSITIVE Sensitive     CIPROFLOXACIN <=0.25 SENSITIVE Sensitive     GENTAMICIN <=1 SENSITIVE Sensitive     IMIPENEM <=0.25 SENSITIVE Sensitive     NITROFURANTOIN <=16 SENSITIVE Sensitive     TRIMETH/SULFA <=20 SENSITIVE Sensitive     AMPICILLIN/SULBACTAM 16 INTERMEDIATE Intermediate     PIP/TAZO 8 SENSITIVE Sensitive     * 80,000 COLONIES/mL ESCHERICHIA COLI  Culture, blood (routine x 2)     Status: None   Collection Time: 09/15/19  6:12 PM   Specimen: BLOOD LEFT FOREARM  Result Value Ref Range Status   Specimen Description BLOOD LEFT FOREARM  Final   Special Requests   Final    BOTTLES DRAWN AEROBIC AND ANAEROBIC Blood Culture results may not be optimal due to an inadequate volume of blood received in culture bottles   Culture   Final    NO GROWTH 5 DAYS Performed at [facility], [place] [address] [place], [place] [address]    Report Status 09/20/2019 FINAL  Final  Culture, blood (routine x 2)     Status: None   Collection Time: 09/15/19  6:17 PM   Specimen: BLOOD LEFT HAND  Result Value Ref Range Status   Specimen Description BLOOD LEFT HAND  Final   Special Requests   Final    BOTTLES DRAWN AEROBIC AND ANAEROBIC Blood Culture results may not be optimal due to an inadequate volume of blood received in culture bottles   Culture   Final    NO GROWTH 5 DAYS Performed at [facility], [place] [address] [place], [place] [address]    Report Status 09/20/2019 FINAL  Final  SARS CORONAVIRUS 2 (TAT 6-24 HRS) Nasopharyngeal Nasopharyngeal Swab     Status: None   Collection Time: 09/20/19  5:01 PM   Specimen: Nasopharyngeal Swab  Result Value Ref Range Status   SARS Coronavirus 2 NEGATIVE NEGATIVE Final    Comment: (NOTE) SARS-CoV-2 target nucleic acids are NOT DETECTED. The SARS-CoV-2 RNA is generally  detectable in upper and lower respiratory specimens during the acute phase of infection. Negative results do not preclude SARS-CoV-2 infection, do not rule out co-infections with other pathogens, and should not be used as the sole basis for treatment or other patient management decisions. Negative results must be combined with clinical observations, patient history, and epidemiological information. The expected result is Negative. Fact Sheet for Patients: SugarRoll.be Fact Sheet for Healthcare Providers: https://www.woods-mathews.com/ This test is not yet approved or cleared by the [place] FDA and  has been authorized for detection and/or diagnosis of SARS-CoV-2 by FDA under an Emergency Use Authorization (EUA). This EUA will remain  in effect (meaning this test can be used) for the duration of the COVID-19 declaration under Section 56 4(b)(1) of the Act, 21 U.S.C. section 360bbb-3(b)(1), unless the authorization is terminated or revoked sooner. Performed at [facility], [place] [address] [place], [place] [address]   Surgical pcr screen     Status: Abnormal   Collection Time: 09/21/19  3:02 PM   Specimen: Nasal Mucosa; Nasal Swab  Result Value Ref Range Status   MRSA, PCR POSITIVE (A) NEGATIVE Final   Staphylococcus aureus POSITIVE (A) NEGATIVE Final    Comment: (NOTE) The Xpert SA Assay (FDA approved for NASAL specimens in patients 11 years of age and  older), is one component of a comprehensive surveillance program. It is not intended to diagnose infection nor to guide or monitor treatment. Performed at [facility], [place] [address] [place], [place] [address]          Radiology Studies: DG Swallowing Func-Speech Pathology  Result Date: 09/20/2019 Objective Swallowing Evaluation: Type of Study: MBS-Modified Barium Swallow Study  Patient Details Name: Cadarius Nevares MRN: [id number] Date of Birth: Sep 14, 1956 Today's Date:  09/20/2019 Time: SLP Start Time (ACUTE ONLY): 1200 -SLP Stop Time (ACUTE ONLY): 1230 SLP Time Calculation (min) (ACUTE ONLY): 30 min Past Medical History: No past medical history on file. HPI: 63 year old male transferred from [facility] for acute respiratory failure, rhabdomyolysis (fell and laid on floor 3 days), acute kidney injury. PMH: CKD stage IV, diabetes mellitus type 2, hyperlipidemia, hypertension, left carotid stenosis, intracerebral hemorrhage, status post right BKA. Per chart EMS reported that the patient's sister was bringing him food while he was lying on the floor during that 3-day period. Intubated 12/23-12/24 (self extubated). Found to have right humeral neck fracture and right upper lobe consolidation per chest x-ray, head CT no acute intracranial process,  Subjective: alert Assessment / Plan / Recommendation CHL IP CLINICAL IMPRESSIONS 09/20/2019 Clinical Impression Pt presents with a moderate-severe dysphagia at this time, marked by poor timing and inconsistent closure of the laryngeal vestibule, leading to silent aspiration of nectar and honey thick liquids.  Pt eventually began coughing at the end of the study, but aspiration did not lead to any protective sensory response immediately after the swallow.  There was diffuse residue in the valleculae and pyriform spaces that would accumulate, and then partially clear during subsequent swallows.  Pt had   difficulty following instructions for protective postures.  Purees appeared to be the safest consistency - however, pt's motor response was so inconsistent and, given his absent sensation to aspiration, I would be hesitant to continue a pureed diet. For now, recommend NPO; allow meds crushed in puree. Consider cortrak.  SLP will follow for pharyngeal timing/strengthening.  Anticipate improvements as mental status improves. D/W RN and Secure Messaged Dr. Manuella Ghazi with results. SLP Visit Diagnosis Dysphagia, pharyngeal phase (R13.13) Attention  and concentration deficit following -- Frontal lobe and executive function deficit following -- Impact on safety and function Moderate aspiration risk   CHL IP TREATMENT RECOMMENDATION 09/20/2019 Treatment Recommendations Therapy as outlined in treatment plan below   Prognosis 09/20/2019 Prognosis for Safe Diet Advancement Good Barriers to Reach Goals Cognitive deficits Barriers/Prognosis Comment -- CHL IP DIET RECOMMENDATION 09/20/2019 SLP Diet Recommendations NPO except meds Liquid Administration via -- Medication Administration Crushed with puree Compensations -- Postural Changes --   CHL IP OTHER RECOMMENDATIONS 09/20/2019 Recommended Consults -- Oral Care Recommendations Oral care QID Other Recommendations --   CHL IP FOLLOW UP RECOMMENDATIONS 09/19/2019 Follow up Recommendations Other (comment)   CHL IP FREQUENCY AND DURATION 09/20/2019 Speech Therapy Frequency (ACUTE ONLY) min 2x/week Treatment Duration 2 weeks      CHL IP ORAL PHASE 09/20/2019 Oral Phase WFL Oral - Pudding Teaspoon -- Oral - Pudding Cup -- Oral - Honey Teaspoon -- Oral - Honey Cup -- Oral - Nectar Teaspoon -- Oral - Nectar Cup -- Oral - Nectar Straw -- Oral - Thin Teaspoon -- Oral - Thin Cup -- Oral - Thin Straw -- Oral - Puree -- Oral - Mech Soft -- Oral - Regular -- Oral - Multi-Consistency -- Oral - Pill -- Oral Phase - Comment --  CHL IP  PHARYNGEAL PHASE 09/20/2019 Pharyngeal Phase Impaired Pharyngeal- Pudding Teaspoon -- Pharyngeal -- Pharyngeal- Pudding Cup -- Pharyngeal -- Pharyngeal- Honey Teaspoon Delayed swallow initiation-vallecula;Delayed swallow initiation-pyriform sinuses;Reduced airway/laryngeal closure;Reduced tongue base retraction;Penetration/Aspiration before swallow;Penetration/Aspiration during swallow;Moderate aspiration;Pharyngeal residue - valleculae;Pharyngeal residue - pyriform Pharyngeal Material enters airway, passes BELOW cords without attempt by patient to eject out (silent aspiration) Pharyngeal- Honey Cup --  Pharyngeal -- Pharyngeal- Nectar Teaspoon Delayed swallow initiation-vallecula;Delayed swallow initiation-pyriform sinuses;Reduced airway/laryngeal closure;Reduced tongue base retraction;Penetration/Aspiration before swallow;Penetration/Aspiration during swallow;Moderate aspiration;Pharyngeal residue - valleculae;Pharyngeal residue - pyriform Pharyngeal Material enters airway, passes BELOW cords without attempt by patient to eject out (silent aspiration) Pharyngeal- Nectar Cup Delayed swallow initiation-vallecula;Delayed swallow initiation-pyriform sinuses;Reduced airway/laryngeal closure;Reduced tongue base retraction;Penetration/Aspiration before swallow;Penetration/Aspiration during swallow;Moderate aspiration;Pharyngeal residue - valleculae;Pharyngeal residue - pyriform Pharyngeal Material enters airway, passes BELOW cords without attempt by patient to eject out (silent aspiration) Pharyngeal- Nectar Straw -- Pharyngeal -- Pharyngeal- Thin Teaspoon -- Pharyngeal -- Pharyngeal- Thin Cup -- Pharyngeal -- Pharyngeal- Thin Straw -- Pharyngeal -- Pharyngeal- Puree Delayed swallow initiation-vallecula;Reduced airway/laryngeal closure;Reduced tongue base retraction;Pharyngeal residue - valleculae;Pharyngeal residue - pyriform Pharyngeal Material does not enter airway Pharyngeal- Mechanical Soft -- Pharyngeal -- Pharyngeal- Regular -- Pharyngeal -- Pharyngeal- Multi-consistency -- Pharyngeal -- Pharyngeal- Pill -- Pharyngeal -- Pharyngeal Comment --  No flowsheet data found. Juan Quam Laurice 09/20/2019, 12:45 PM                   Scheduled Meds: . sodium chloride   Intravenous Once  . amLODipine  10 mg Per Tube Daily  . chlorhexidine  60 mL Topical Once  . chlorhexidine gluconate (MEDLINE KIT)  15 mL Mouth Rinse BID  . Chlorhexidine Gluconate Cloth  6 each Topical Daily  . darbepoetin (ARANESP) injection - NON-DIALYSIS  100 mcg Subcutaneous Q Thu-1800  . feeding supplement (PRO-STAT SUGAR FREE 64)   30 mL Per Tube TID  . free water  300 mL Per Tube Q6H  . insulin aspart  0-9 Units Subcutaneous Q4H  . mupirocin ointment   Nasal BID  . pantoprazole (PROTONIX) IV  40 mg Intravenous QHS   Continuous Infusions: . sodium chloride 10 mL/hr at 09/21/19 1100  . [START ON 09/28/2019]  ceFAZolin (ANCEF) IV    . feeding supplement (OSMOLITE 1.2 CAL) Stopped (09/21/19 1930)  . tranexamic acid            Aline August, MD [facility] 09/22/2019, 9:49 AM

## 2019-09-22 NOTE — Procedures (Signed)
Cortrak  Tube Type:  Cortrak - 43 inches Tube Location:  Left nare Initial Placement:  Stomach Secured by: Bridle Technique Used to Measure Tube Placement:  Documented cm marking at nare/ corner of mouth Cortrak Secured At:  70 cm    Cortrak Tube Team Note:  Consult received to place a Cortrak feeding tube.   X-ray is required, abdominal x-ray has been ordered by the Cortrak team. Please confirm tube placement before using the Cortrak tube.   If the tube becomes dislodged please keep the tube and contact the Cortrak team at [email or web address] ([email or web address]) for replacement.  If after hours and replacement cannot be delayed, place a NG tube and confirm placement with an abdominal x-ray.    Koleen Distance MS, RD, LDN Pager #- [phone] Office#- [phone] After Hours Pager: [phone]

## 2019-09-22 NOTE — Progress Notes (Signed)
  Speech Language Pathology Treatment: Dysphagia  Patient Details Name: Ryan Villa MRN: [id number] DOB: 04-04-1956 Today's Date: 09/22/2019 Time: 1210-1220 SLP Time Calculation (min) (ACUTE ONLY): 10 min  Assessment / Plan / Recommendation Clinical Impression  Pt pulled out and chewed through cortrak tube last night.  He was sleeping in recliner upon arrival.  Aroused minimally for swallow re-assessment.  Poor attention, poor oral seal with ice chips/teaspoon water spilling from mouth.  He achieved several swallows, followed by wet coughing.  He continues to exhibit s/s of dysphagia, as noted per 12/28 MBS, which revealed significant aspiration of even thickened materials. Pt requires max verbal/tactile/visual cues today for attention and sequence-following.  Recommend continued NPO for now; SLP will continue to follow for readiness, pharyngeal exercises when he is cognitively able.   HPI HPI: 63 year old male transferred from [facility] for acute respiratory failure, rhabdomyolysis (fell and laid on floor 3 days), acute kidney injury. PMH: CKD stage IV, diabetes mellitus type 2, hyperlipidemia, hypertension, left carotid stenosis, intracerebral hemorrhage, status post right BKA. Per chart EMS reported that the patient's sister was bringing him food while he was lying on the floor during that 3-day period. Intubated 12/23-12/24 (self extubated). Found to have right humeral neck fracture and right upper lobe consolidation per chest x-ray, head CT no acute intracranial process,       SLP Plan  Continue with current plan of care       Recommendations  Diet recommendations: NPO                Oral Care Recommendations: Oral care QID Follow up Recommendations: Other (comment)(tbd) SLP Visit Diagnosis: Dysphagia, pharyngeal phase (R13.13) Plan: Continue with current plan of care       GO                Ryan Villa 09/22/2019, 12:22 PM  Ryan Villa, [place] Office number [phone] Pager [phone]

## 2019-09-22 NOTE — Progress Notes (Signed)
Nutrition Follow-up  DOCUMENTATION CODES:   Severe malnutrition in context of acute illness/injury  INTERVENTION:   Continue TF via Cortrak tube:   Osmolite 1.2 at 60 ml/h.  Pro-stat 30 ml TID.   Provides 2028 kcal, 125 gm protein, 1181 ml free water daily.   Free water flushes 300 ml every 6 hours.   NUTRITION DIAGNOSIS:   Severe Malnutrition related to acute illness(S/P fall at home with rhabdomyolysis after lying on the floor for 3 days) as evidenced by moderate fat depletion, moderate muscle depletion, severe muscle depletion, severe fat depletion.  Ongoing   GOAL:   Patient will meet greater than or equal to 90% of their needs  Met with TF  MONITOR:   Diet advancement, TF tolerance, Labs, Skin, I & O's  REASON FOR ASSESSMENT:   Consult Enteral/tube feeding initiation and management  ASSESSMENT:   63 yo male admitted with respiratory failure, rhabdomyolysis, and AKI after fall at home 3 days PTA. He had been lying on the floor x 3 days since his fall. Found to have PNA, UTI, and right proximal humerus fracture. PMH includes CKD-IV, HLD, DM-2, HTN, L carotid stenosis, ICH, R BKA.  Patient self-extubated on 12/25.  Patient working with PT as able.  Plans for R shoulder reverse arthroplasty on 1/5.   S/P MBS with SLP on 12/28. Patient was made NPO, Cortrak tube was placed, and TF was initiated 12/28.   Patient pulled Cortrak and chewed through it last night, therefore, TF has been off since last night. S/P repeat swallow evaluation this morning. Remains NPO. Another Cortrak was placed today, tip in the stomach.   TF has been resumed: Osmolite 1.2 at 60 ml/h with Pro-stat 30 ml TID providing 2028 kcal, 125 gm protein, 1181 ml free water daily. Free water flushes 300 ml every 6 hours.   Labs reviewed. BUN 86 (H), creatinine 5.12 (H) CBG's: 143-132-125-118  Medications reviewed.  Palliative care team has been consulted.  Diet Order:   Diet Order            Diet NPO time specified Except for: Sips with Meds  Diet effective now              EDUCATION NEEDS:   Not appropriate for education at this time  Skin:  skin tear x 2 to R forearm  Last BM:  12/30 type 5/6  Height:   Ht Readings from Last 1 Encounters:  09/15/19 5' 8" (1.727 m)    Weight:   Wt Readings from Last 1 Encounters:  09/22/19 70 kg    Ideal Body Weight:  65.5 kg(adjusted for BKA)  BMI:  Body mass index is 23.46 kg/m.  Estimated Nutritional Needs:   Kcal:  2000-2200  Protein:  110-123  Fluid:  >/= 2 L    Molli Barrows, RD, LDN, [place] Pager [phone] After Hours Pager [phone]

## 2019-09-23 DIAGNOSIS — Z4659 Encounter for fitting and adjustment of other gastrointestinal appliance and device: Secondary | ICD-10-CM

## 2019-09-23 LAB — COMPREHENSIVE METABOLIC PANEL
ALT: 23 U/L (ref 0–44)
AST: 20 U/L (ref 15–41)
Albumin: 1.8 g/dL — ABNORMAL LOW (ref 3.5–5.0)
Alkaline Phosphatase: 56 U/L (ref 38–126)
Anion gap: 10 (ref 5–15)
BUN: 91 mg/dL — ABNORMAL HIGH (ref 8–23)
CO2: 26 mmol/L (ref 22–32)
Calcium: 7.4 mg/dL — ABNORMAL LOW (ref 8.9–10.3)
Chloride: 107 mmol/L (ref 98–111)
Creatinine, Ser: 5.26 mg/dL — ABNORMAL HIGH (ref 0.61–1.24)
GFR calc Af Amer: 12 mL/min — ABNORMAL LOW (ref >60)
GFR calc non Af Amer: 11 mL/min — ABNORMAL LOW (ref >60)
Glucose, Bld: 216 mg/dL — ABNORMAL HIGH (ref 70–99)
Potassium: 4.2 mmol/L (ref 3.5–5.1)
Sodium: 143 mmol/L (ref 135–145)
Total Bilirubin: 0.5 mg/dL (ref 0.3–1.2)
Total Protein: 4.6 g/dL — ABNORMAL LOW (ref 6.5–8.1)

## 2019-09-23 LAB — CBC WITH DIFFERENTIAL/PLATELET
Abs Immature Granulocytes: 0.15 10*3/uL — ABNORMAL HIGH (ref 0.00–0.07)
Basophils Absolute: 0 10*3/uL (ref 0.0–0.1)
Basophils Relative: 0 %
Eosinophils Absolute: 0.4 10*3/uL (ref 0.0–0.5)
Eosinophils Relative: 4 %
HCT: 26.9 % — ABNORMAL LOW (ref 39.0–52.0)
Hemoglobin: 8.5 g/dL — ABNORMAL LOW (ref 13.0–17.0)
Immature Granulocytes: 1 %
Lymphocytes Relative: 11 %
Lymphs Abs: 1.2 10*3/uL (ref 0.7–4.0)
MCH: 30.9 pg (ref 26.0–34.0)
MCHC: 31.6 g/dL (ref 30.0–36.0)
MCV: 97.8 fL (ref 80.0–100.0)
Monocytes Absolute: 0.6 10*3/uL (ref 0.1–1.0)
Monocytes Relative: 6 %
Neutro Abs: 8.1 10*3/uL — ABNORMAL HIGH (ref 1.7–7.7)
Neutrophils Relative %: 78 %
Platelets: 64 10*3/uL — ABNORMAL LOW (ref 150–400)
RBC: 2.75 MIL/uL — ABNORMAL LOW (ref 4.22–5.81)
RDW: 13.7 % (ref 11.5–15.5)
WBC: 10.4 10*3/uL (ref 4.0–10.5)
nRBC: 0 % (ref 0.0–0.2)

## 2019-09-23 LAB — GLUCOSE, CAPILLARY
Glucose-Capillary: 179 mg/dL — ABNORMAL HIGH (ref 70–99)
Glucose-Capillary: 251 mg/dL — ABNORMAL HIGH (ref 70–99)
Glucose-Capillary: 250 mg/dL — ABNORMAL HIGH (ref 70–99)
Glucose-Capillary: 180 mg/dL — ABNORMAL HIGH (ref 70–99)
Glucose-Capillary: 202 mg/dL — ABNORMAL HIGH (ref 70–99)

## 2019-09-23 LAB — PHOSPHORUS: Phosphorus: 5.4 mg/dL — ABNORMAL HIGH (ref 2.5–4.6)

## 2019-09-23 LAB — PROTIME-INR
INR: 0.9 (ref 0.8–1.2)
Prothrombin Time: 11.8 seconds (ref 11.4–15.2)

## 2019-09-23 LAB — MAGNESIUM: Magnesium: 2.1 mg/dL (ref 1.7–2.4)

## 2019-09-23 LAB — HEPARIN INDUCED PLATELET AB (HIT ANTIBODY): Heparin Induced Plt Ab: 2.961 OD — ABNORMAL HIGH (ref 0.000–0.400)

## 2019-09-23 MED ORDER — ARGATROBAN 50 MG/50ML IV SOLN
1.4000 ug/kg/min | INTRAVENOUS | Status: DC
  Administered 2019-09-23: 1 ug/kg/min via INTRAVENOUS
  Administered 2019-09-24: 1.2 ug/kg/min via INTRAVENOUS
  Administered 2019-09-24 – 2019-09-28 (×9): 1.4 ug/kg/min via INTRAVENOUS
  Filled 2019-09-23 (×15): qty 50

## 2019-09-23 MED ORDER — CHLORHEXIDINE GLUCONATE 0.12 % MT SOLN
OROMUCOSAL | Status: AC
  Filled 2019-09-23: qty 15

## 2019-09-23 NOTE — Progress Notes (Signed)
Patient ID: Ryan Villa, male   DOB: [birth year], 63 y.o.   MRN: [id number]  PROGRESS NOTE    Ryan Villa  BSJ:[id number] DOB: Jan 22, 1956 DOA: 09/15/2019 PCP: Maryella Shivers, MD   Brief Narrative:  63 year old 63 with history of chronic renal disease stage IV, diabetes mellitus type 2, hyperlipidemia, hypertension, left carotid stenosis, intracerebral hemorrhage, status post right BKA presented to [facility] ER after a fall at home 3 days prior to presentation.  He was found to have slurred speech along with hypoxia requiring nonrebreather and significant ecchymosis over the right shoulder area.  He subsequently became more hypoxic and required intubation.  Creatinine was 10.5.  CK 2411.  Procalcitonin 0.61.  UDS negative.  He responded fairly well to IV fluids and briefly required pressor support.  He was found to have right humeral neck fracture on x-ray per ER paper chart, right upper lobe consolidation per chest x-ray, CT of the head had no acute intracranial process, diffuse paraspinous inflammation.  As per PCCM, patient was tested negative for Covid at [facility].  He was admitted under PCCM service, started on broad-spectrum antibiotics.  Nephrology and orthopedics were consulted.  He subsequently self extubated at night on 09/17/2019.  Creatinine improved with IV fluids and neurology signed off.  He was transferred to [facility] service on 09/19/2019.  Subjective: No acute issues or events overnight, patient somnolent this morning, arousable with some difficulty but otherwise appears quite well, review of systems limited given patient's mental status.  Assessment & Plan:   Acute hypoxic respiratory failure -Intubated on admission and self extubated on 12/20 at night.  Transferred to [facility] service on 09/19/2019.  PCCM is signed off. SpO2: 100 % O2 Flow Rate (L/min): 5 L/min FiO2 (%): 40 % -Wean off oxygen as tolerated.  Incentive spirometry. -Diet as per SLP recommendations.  Patient pulled  out his cortrak - now replaced 09/22/19 -Hold off on CTA chest given elevated creatinine; unable to anticoagulate due to ?HIT and thrombocytopenia. Consider VQ as PNA resolves.  Right lower pneumonia, POA -Covid test was negative at [facility] -Blood cultures negative so far. -Patient received 7 days of IV Rocephin, last dose on 09/01/2019 and also received 3 days of oral Zithromax. -Currently afebrile -Monitor off antibiotics.  E. coli UTI -Has completed treatment with Rocephin  Displaced right humerus surgical neck  Fracture -Orthopedics following.  Orthopedics update on 09/22/2019 and surgical intervention will be postponed till next Tuesday (09/28/19) given worsening thrombocytopenia and skin tear on the right upper extremity.  Right upper extremity edema -Probably secondary to humerus fracture.  Venous Doppler was negative for DVT  Thrombocytopenia -Stable at 68 (baseline around 150 per chart review) - Questionably from consumption; DC heparin. Check HIT panel and DIC panel. Ddimer 3.4; Fibrinogen 381, INR 1.0, Protime 13.0, PTT 26 - If platelets drop further, consider hematology evaluation  Hypernatremia -Much improved.  Monitor  AKI on probable CKD stage IV -Creatinine was around 4 in 2018 in care everywhere -Creatinine 5.26 today (admitted at 9.68).  Nephrology has signed off.  Monitor.  Strict input and output. Lab Results  Component Value Date   CREATININE 5.26 (H) 09/23/2019   CREATININE 5.12 (H) 09/22/2019   CREATININE 5.17 (H) 09/21/2019   Anemia of chronic disease -Hemoglobin 8.5 today. -Status post 1 unit packed red cell transfusion on 09/12/2019.  Monitor  Hypertension  -Stable.  Continue Norvasc  Acute metabolic encephalopathy/delirium -Initial CT head with no acute findings, multiple remote lacunar strokes seen on CAT scan -  Monitor mental status.  Frequent reorientation.  Fall precautions.  History of CVA -LDL low.  Lipitor on hold.  Restart  Plavix once able to tolerate orally.  Generalized conditioning -Overall prognosis is guarded to poor.  Will request palliative care evaluation for goals of care discussion  DVT prophylaxis: DC heparin - ongoing thrombocytopenia Code Status: Full Family Communication: None at bedside Disposition Plan: We will remain inpatient for further work-up and awaiting orthopedic intervention Consultants: PCCM/nephrology/orthopedics Procedures: Intubation on presentation and self extubation on 09/17/2019 Right IJ central line placement on 09/15/2019  Antimicrobials:  Anti-infectives (From admission, onward)   Start     Dose/Rate Route Frequency Ordered Stop   09/28/19 0000  ceFAZolin (ANCEF) IVPB 2g/100 mL premix    Note to Pharmacy: Surgery delayed until 1/5.   2 g 200 mL/hr over 30 Minutes Intravenous To Surgery 09/22/19 0904 09/29/19 0000   09/22/19 0815  ceFAZolin (ANCEF) IVPB 2g/100 mL premix  Status:  Discontinued     2 g 200 mL/hr over 30 Minutes Intravenous To Surgery 09/22/19 0806 09/22/19 0905   09/15/19 1715  azithromycin (ZITHROMAX) 500 mg in sodium chloride 0.9 % 250 mL IVPB  Status:  Discontinued     500 mg 250 mL/hr over 60 Minutes Intravenous Every 24 hours 09/15/19 1711 09/18/19 1107   09/15/19 1715  cefTRIAXone (ROCEPHIN) 1 g in sodium chloride 0.9 % 100 mL IVPB     1 g 200 mL/hr over 30 Minutes Intravenous Every 24 hours 09/15/19 1711 09/21/19 1754       Objective: Vitals:   09/23/19 0500 09/23/19 0600 09/23/19 0700 09/23/19 0743  BP: (!) 162/67 108/66 (!) 165/87   Pulse: (!) 58 (!) 54 (!) 57   Resp: _0 Temp:    97.7 F (36.5 C)  TempSrc:    Axillary  SpO2: 100% 100% 100%   Weight: 69.7 kg     Height:        Intake/Output Summary (Last 24 hours) at 09/23/2019 0746 Last data filed at 09/23/2019 0700 Gross per 24 hour  Intake 1450 ml  Output 910 ml  Net 540 ml   Filed Weights   09/21/19 0407 09/22/19 0500 09/23/19 0500  Weight: 69.3 kg 70 kg 69.7  kg    Examination:  General exam: Looks chronically ill and older than stated age.  No distress.  Somnolent but arousable. Respiratory system: Bilateral decreased breath sounds at bases with some scattered crackles Cardiovascular system: S1 & S2 heard, mild intermittent bradycardia Gastrointestinal system: Abdomen is nondistended, soft and nontender. Normal bowel sounds heard. Extremities: No cyanosis, clubbing; trace lower extremity edema present.  Right BKA present.  Right upper extremity swelling present Central nervous system: Awake but confused. No focal neurological deficits. Moving extremities Skin: No rashes, lesions or ulcers   Data Reviewed: I have personally reviewed following labs and imaging studies  CBC: Recent Labs  Lab 09/18/19 0353 09/19/19 0630 09/20/19 0530 09/21/19 0334 09/22/19 0443 09/22/19 0942 09/23/19 0543  WBC 10.4 10.4 10.5 11.1* 10.9*  --  10.4  NEUTROABS 8.6* 8.4* 8.0*  --   --   --  8.1*  HGB 8.3* 9.6* 8.6* 7.8* 7.6*  --  8.5*  HCT 25.6* 30.4* 27.2* 24.2* 23.7*  --  26.9*  MCV 94.5 94.7 96.1 96.0 95.6  --  97.8  PLT 150 146* 123* 79* 58* 57* 64*   Basic Metabolic Panel: Recent Labs  Lab 09/18/19 0353 09/19/19 0630 09/20/19 0530 09/21/19 0334 09/22/19  0442 09/23/19 0543  NA 155* 150* 145 140 142 143  K 3.4* 3.5 3.2* 3.1* 3.5 4.2  CL 113* 109 104 103 106 107  CO2 _0 GLUCOSE 157* 212* 250* 308* 130* 216*  BUN 105* 88* 89* 84* 86* 91*  CREATININE 5.87* 5.09* 5.02* 5.17* 5.12* 5.26*  CALCIUM 7.0* 7.8* 7.1* 6.8* 6.9* 7.4*  MG 1.7 1.7  --  1.4* 2.2 2.1  PHOS 4.7* 4.1 3.7 3.9 4.0 5.4*   GFR: Estimated Creatinine Clearance: 13.9 mL/min (A) (by C-G formula based on SCr of 5.26 mg/dL (H)). Liver Function Tests: Recent Labs  Lab 09/19/19 0630 09/20/19 0530 09/21/19 0334 09/22/19 0442 09/23/19 0543  AST  --  17  --   --  20  ALT  --  17  --   --  23  ALKPHOS  --  49  --   --  56  BILITOT  --  0.5  --   --  0.5  PROT   --  4.4*  --   --  4.6*  ALBUMIN 2.1* 1.9*  1.9* 1.7* 1.7* 1.8*   No results for input(s): LIPASE, AMYLASE in the last 168 hours. No results for input(s): AMMONIA in the last 168 hours. Coagulation Profile: Recent Labs  Lab 09/22/19 0942  INR 1.0   Cardiac Enzymes: Recent Labs  Lab 09/19/19 0630  CKTOTAL 298   BNP (last 3 results) No results for input(s): PROBNP in the last 8760 hours. HbA1C: No results for input(s): HGBA1C in the last 72 hours. CBG: Recent Labs  Lab 09/22/19 1118 09/22/19 1510 09/22/19 1936 09/22/19 2319 09/23/19 0317  GLUCAP 125* 118* 192* 203* 179*   Lipid Profile: No results for input(s): CHOL, HDL, LDLCALC, TRIG, CHOLHDL, LDLDIRECT in the last 72 hours. Thyroid Function Tests: No results for input(s): TSH, T4TOTAL, FREET4, T3FREE, THYROIDAB in the last 72 hours. Anemia Panel: No results for input(s): VITAMINB12, FOLATE, FERRITIN, TIBC, IRON, RETICCTPCT in the last 72 hours. Sepsis Labs: No results for input(s): PROCALCITON, LATICACIDVEN in the last 168 hours.  Recent Results (from the past 240 hour(s))  MRSA PCR Screening     Status: Abnormal   Collection Time: 09/15/19  4:41 PM   Specimen: Nasal Mucosa; Nasopharyngeal  Result Value Ref Range Status   MRSA by PCR POSITIVE (A) NEGATIVE Final    Comment:        The GeneXpert MRSA Assay (FDA approved for NASAL specimens only), is one component of a comprehensive MRSA colonization surveillance program. It is not intended to diagnose MRSA infection nor to guide or monitor treatment for MRSA infections. RESULT CALLED TO, READ BACK BY AND VERIFIED WITH: D [facility] RN 09/15/19 2120 JDW Performed at [facility] [address], [place], [place] [address]   Urine culture     Status: Abnormal   Collection Time: 09/15/19  4:47 PM   Specimen: Urine, Random  Result Value Ref Range Status   Specimen Description URINE, RANDOM  Final   Special Requests   Final    NONE Performed at [facility], [place] [address] [place], [place] [address]    Culture 80,000 COLONIES/mL ESCHERICHIA COLI (A)  Final   Report Status 09/18/2019 FINAL  Final   Organism ID, Bacteria ESCHERICHIA COLI (A)  Final      Susceptibility   Escherichia coli - MIC*    AMPICILLIN >=32 RESISTANT Resistant     CEFAZOLIN 8 SENSITIVE Sensitive  CEFTRIAXONE <=1 SENSITIVE Sensitive     CIPROFLOXACIN <=0.25 SENSITIVE Sensitive     GENTAMICIN <=1 SENSITIVE Sensitive     IMIPENEM <=0.25 SENSITIVE Sensitive     NITROFURANTOIN <=16 SENSITIVE Sensitive     TRIMETH/SULFA <=20 SENSITIVE Sensitive     AMPICILLIN/SULBACTAM 16 INTERMEDIATE Intermediate     PIP/TAZO 8 SENSITIVE Sensitive     * 80,000 COLONIES/mL ESCHERICHIA COLI  Culture, blood (routine x 2)     Status: None   Collection Time: 09/15/19  6:12 PM   Specimen: BLOOD LEFT FOREARM  Result Value Ref Range Status   Specimen Description BLOOD LEFT FOREARM  Final   Special Requests   Final    BOTTLES DRAWN AEROBIC AND ANAEROBIC Blood Culture results may not be optimal due to an inadequate volume of blood received in culture bottles   Culture   Final    NO GROWTH 5 DAYS Performed at [facility], [place] [address], [place], [place] [address]    Report Status 09/20/2019 FINAL  Final  Culture, blood (routine x 2)     Status: None   Collection Time: 09/15/19  6:17 PM   Specimen: BLOOD LEFT HAND  Result Value Ref Range Status   Specimen Description BLOOD LEFT HAND  Final   Special Requests   Final    BOTTLES DRAWN AEROBIC AND ANAEROBIC Blood Culture results may not be optimal due to an inadequate volume of blood received in culture bottles   Culture   Final    NO GROWTH 5 DAYS Performed at [facility], [place] [address] [place], [place] [address]    Report Status 09/20/2019 FINAL  Final  SARS CORONAVIRUS 2 (TAT 6-24 HRS) Nasopharyngeal Nasopharyngeal Swab     Status: None   Collection Time: 09/20/19  5:01 PM   Specimen: Nasopharyngeal Swab    Result Value Ref Range Status   SARS Coronavirus 2 NEGATIVE NEGATIVE Final    Comment: (NOTE) SARS-CoV-2 target nucleic acids are NOT DETECTED. The SARS-CoV-2 RNA is generally detectable in upper and lower respiratory specimens during the acute phase of infection. Negative results do not preclude SARS-CoV-2 infection, do not rule out co-infections with other pathogens, and should not be used as the sole basis for treatment or other patient management decisions. Negative results must be combined with clinical observations, patient history, and epidemiological information. The expected result is Negative. Fact Sheet for Patients: SugarRoll.be Fact Sheet for Healthcare Providers: https://www.woods-mathews.com/ This test is not yet approved or cleared by the [place] FDA and  has been authorized for detection and/or diagnosis of SARS-CoV-2 by FDA under an Emergency Use Authorization (EUA). This EUA will remain  in effect (meaning this test can be used) for the duration of the COVID-19 declaration under Section 56 4(b)(1) of the Act, 21 U.S.C. section 360bbb-3(b)(1), unless the authorization is terminated or revoked sooner. Performed at [facility], [place] [address] [place], [place] [address]   Surgical pcr screen     Status: Abnormal   Collection Time: 09/21/19  3:02 PM   Specimen: Nasal Mucosa; Nasal Swab  Result Value Ref Range Status   MRSA, PCR POSITIVE (A) NEGATIVE Final   Staphylococcus aureus POSITIVE (A) NEGATIVE Final    Comment: (NOTE) The Xpert SA Assay (FDA approved for NASAL specimens in patients 45 years of age and older), is one component of a comprehensive surveillance program. It is not intended to diagnose infection nor to guide or monitor treatment. Performed at [facility], [place] [address] [place], [place] [address]          Radiology Studies: DG Abd Portable 1V  Result Date:  09/22/2019 CLINICAL DATA:  Feeding tube placement EXAM: PORTABLE ABDOMEN - 1 VIEW COMPARISON:  None. FINDINGS: Feeding tube with the tip projecting over the antrum of the stomach. There is oral contrast in the colon. There is no bowel dilatation to suggest obstruction. There is no evidence of pneumoperitoneum, portal venous gas or pneumatosis. There are no pathologic calcifications along the expected course of the ureters. The osseous structures are unremarkable. IMPRESSION: Feeding tube with the tip projecting over the antrum of the stomach. Electronically Signed   By: Kathreen Devoid   On: 09/22/2019 14:56        Scheduled Meds: . sodium chloride   Intravenous Once  . amLODipine  10 mg Per Tube Daily  . chlorhexidine  60 mL Topical Once  . chlorhexidine gluconate (MEDLINE KIT)  15 mL Mouth Rinse BID  . Chlorhexidine Gluconate Cloth  6 each Topical Daily  . darbepoetin (ARANESP) injection - NON-DIALYSIS  100 mcg Subcutaneous Q Thu-1800  . feeding supplement (PRO-STAT SUGAR FREE 64)  30 mL Per Tube TID  . free water  300 mL Per Tube Q6H  . insulin aspart  0-9 Units Subcutaneous Q4H  . mupirocin ointment   Nasal BID  . pantoprazole (PROTONIX) IV  40 mg Intravenous QHS   Continuous Infusions: . sodium chloride 10 mL/hr at 09/21/19 1100  . [START ON 09/28/2019]  ceFAZolin (ANCEF) IV    . feeding supplement (OSMOLITE 1.2 CAL) 1,000 mL (09/22/19 1633)  . tranexamic acid      Little Ishikawa, DO [facility] 09/23/2019, 7:46 AM

## 2019-09-23 NOTE — Progress Notes (Signed)
ANTICOAGULATION CONSULT NOTE - Initial Consult  Pharmacy Consult for Argatroban Indication: r/o HIT, r/o PE  Allergies  Allergen Reactions  . Heparin     "Heparin antibody positive; SRA pending"    Patient Measurements: Height: 5\' 8"  (172.7 cm) Weight: 153 lb 10.6 oz (69.7 kg) IBW/kg (Calculated) : 68.4 Heparin Dosing Weight:   Vital Signs: Temp: 98.4 F (36.9 C) (12/31 1513) Temp Source: Axillary (12/31 1513) BP: 143/72 (12/31 1600) Pulse Rate: 63 (12/31 1600)  Labs: Recent Labs    09/21/19 0334 09/22/19 0442 09/22/19 0443 09/22/19 0942 09/23/19 0543  HGB 7.8*  --  7.6*  --  8.5*  HCT 24.2*  --  23.7*  --  26.9*  PLT 79*  --  58* 57* 64*  APTT  --   --   --  26  --   LABPROT  --   --   --  13.0  --   INR  --   --   --  1.0  --   CREATININE 5.17* 5.12*  --   --  5.26*    Estimated Creatinine Clearance: 13.9 mL/min (A) (by C-G formula based on SCr of 5.26 mg/dL (H)).   Medical History: No past medical history on file.  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 Heparin was stopped on 12/30 and HITT testing was sent.   The patient's estimated 4T score is 5 and the heparin antibody ODT was 2.961 making HITT a possibility, SRA is ordered and pending results. Noted RUE edema with dopplers negative for DVT however per discussion with MD, also with concern for possible PE. Will start argatroban for anticoagulation while awaiting further results.   Goal of Therapy:  aPTT 50-90 seconds Monitor platelets by anticoagulation protocol: Yes   Plan:  - Start Argatroban at a rate of 1 mcg/kg/min - Will get a baseline PT/INR; CMET and CBC already checked earlier today - Will continue to monitor for any signs/symptoms of bleeding, SRA results, and will get an aPTT 2 hours after initiation, then q2h until therapeutic x 2, then daily  Thank you for allowing pharmacy to be a part of this patient's  care.  Alycia Rossetti, PharmD, BCPS Clinical Pharmacist Clinical phone for 09/23/2019: [phone] 09/23/2019 6:38 PM   **Pharmacist phone directory can now be found on [email or web address] ([email or web address]).  Listed under [place].

## 2019-09-23 NOTE — Progress Notes (Signed)
Pharmacy Heparin Induced Thrombocytopenia (HIT) Note:  Ryan Villa is an 63 y.o. male being evaluated for HIT. Heparin was started 12/23 for VTE prophylaxis, and baseline platelets were 190.   HIT labs were ordered on 12/30 when platelets dropped to 57.  Auto-populate labs:  Heparin Induced Plt Ab  Date/Time Value Ref Range Status  09/22/2019 09:42 AM 2.961 (H) 0.000 - 0.400 OD Final    Comment:    (NOTE) Performed At: [facility] [place], [place] [id number] Rush Farmer MD [id number]      CALCULATE SCORE:  4Ts (see the HIT Algorithm) Score  Thrombocytopenia 2  Timing 2  Thrombosis 0  Other causes of thrombocytopenia 1  Total 5     Recommendations (A or B) are based on available lab results (HIT antibody and/or SRA) and the HIT algorithm    A. HIT antibody result available  Possible HIT    Order SRA:  Yes  Discontinue heparin / LMWH:  Yes  Initiate alternative anticoagulation:  Yes  Document heparin allergy:  Yes  B. SRA result availability SRA not available  Name of MD Contacted: [place] (Discussed with provider) Labs ordered:  SRA ordered  Heparin allergy:  Heparin allergy documented or updated. Anticoagulation plans:  Begin alternative anticoagulation with Argatroban   Comments (List any alternative plans or if there are contraindications to therapy)   Thank you for allowing pharmacy to be a part of this patient's care.  Alycia Rossetti, PharmD, BCPS Clinical Pharmacist Clinical phone for 09/23/2019: [phone] 09/23/2019 5:57 PM   **Pharmacist phone directory can now be found on [email or web address] ([email or web address]).  Listed under [place].

## 2019-09-24 LAB — GLUCOSE, CAPILLARY
Glucose-Capillary: 168 mg/dL — ABNORMAL HIGH (ref 70–99)
Glucose-Capillary: 170 mg/dL — ABNORMAL HIGH (ref 70–99)
Glucose-Capillary: 174 mg/dL — ABNORMAL HIGH (ref 70–99)
Glucose-Capillary: 176 mg/dL — ABNORMAL HIGH (ref 70–99)
Glucose-Capillary: 215 mg/dL — ABNORMAL HIGH (ref 70–99)
Glucose-Capillary: 221 mg/dL — ABNORMAL HIGH (ref 70–99)

## 2019-09-24 LAB — CBC
HCT: 25.3 % — ABNORMAL LOW (ref 39.0–52.0)
Hemoglobin: 8.1 g/dL — ABNORMAL LOW (ref 13.0–17.0)
MCH: 31.2 pg (ref 26.0–34.0)
MCHC: 32 g/dL (ref 30.0–36.0)
MCV: 97.3 fL (ref 80.0–100.0)
Platelets: 72 10*3/uL — ABNORMAL LOW (ref 150–400)
RBC: 2.6 MIL/uL — ABNORMAL LOW (ref 4.22–5.81)
RDW: 14.1 % (ref 11.5–15.5)
WBC: 9.8 10*3/uL (ref 4.0–10.5)
nRBC: 0 % (ref 0.0–0.2)

## 2019-09-24 LAB — RENAL FUNCTION PANEL
Albumin: 1.8 g/dL — ABNORMAL LOW (ref 3.5–5.0)
Anion gap: 6 (ref 5–15)
BUN: 92 mg/dL — ABNORMAL HIGH (ref 8–23)
CO2: 28 mmol/L (ref 22–32)
Calcium: 7.3 mg/dL — ABNORMAL LOW (ref 8.9–10.3)
Chloride: 109 mmol/L (ref 98–111)
Creatinine, Ser: 4.73 mg/dL — ABNORMAL HIGH (ref 0.61–1.24)
GFR calc Af Amer: 14 mL/min — ABNORMAL LOW (ref >60)
GFR calc non Af Amer: 12 mL/min — ABNORMAL LOW (ref >60)
Glucose, Bld: 187 mg/dL — ABNORMAL HIGH (ref 70–99)
Phosphorus: 5.4 mg/dL — ABNORMAL HIGH (ref 2.5–4.6)
Potassium: 4.5 mmol/L (ref 3.5–5.1)
Sodium: 143 mmol/L (ref 135–145)

## 2019-09-24 LAB — APTT
aPTT: 47 seconds — ABNORMAL HIGH (ref 24–36)
aPTT: 49 seconds — ABNORMAL HIGH (ref 24–36)
aPTT: 60 seconds — ABNORMAL HIGH (ref 24–36)
aPTT: 58 seconds — ABNORMAL HIGH (ref 24–36)

## 2019-09-24 MED ORDER — DEXTROSE-NACL 5-0.9 % IV SOLN: INTRAVENOUS | Status: AC

## 2019-09-24 NOTE — Progress Notes (Signed)
[place] for Argatroban Indication: r/o HIT, r/o PE  Allergies  Allergen Reactions  . Heparin     "Heparin antibody positive; SRA pending"    Patient Measurements: Height: 5\' 8"  (172.7 cm) Weight: 153 lb 10.6 oz (69.7 kg) IBW/kg (Calculated) : 68.4 Heparin Dosing Weight:   Vital Signs: Temp: 97.7 F (36.5 C) (01/01 0945) Temp Source: Oral (01/01 0945) BP: 135/55 (01/01 0945) Pulse Rate: 70 (01/01 0945)  Labs: Recent Labs    09/22/19 0442 09/22/19 0443 09/22/19 0443 09/22/19 0942 09/23/19 0543 09/23/19 1902 09/24/19 0057 09/24/19 0600 09/24/19 1019  HGB  --  7.6*  --   --  8.5*  --  8.1*  --   --   HCT  --  23.7*  --   --  26.9*  --  25.3*  --   --   PLT  --  58*  --  57* 64*  --  72*  --   --   APTT  --   --    < > 26  --   --  47* 49* 60*  LABPROT  --   --   --  13.0  --  11.8  --   --   --   INR  --   --   --  1.0  --  0.9  --   --   --   CREATININE 5.12*  --   --   --  5.26*  --  4.73*  --   --    < > = values in this interval not displayed.    Estimated Creatinine Clearance: 15.5 mL/min (A) (by C-G formula based on SCr of 4.73 mg/dL (H)).   Medical History: No past medical history on file.  Assessment: 70 YOM on heparin SQ for VTE prophylaxis with a >50% drop in platelets on day 7 of therapy concerning for HITT. Heparin was stopped on 12/30 and HITT testing was sent.   The patient's estimated 4T score is 5 and the heparin antibody ODT was 2.961 making HITT a possibility, SRA is ordered and pending results. Noted RUE edema with dopplers negative for DVT however per discussion with MD, also with concern for possible PE. Will start argatroban for anticoagulation while awaiting further results.   aPTT therapeutic at 60 (goal 50-90) on argatrobran 1.70mcg/kg/min drawn 1.5 hrs after rate increase. No reported bleeding. Hgb 8.1. Plt 72.   Goal of Therapy:  aPTT 50-90 seconds Monitor platelets by anticoagulation protocol:  Yes   Plan:  - Continue argatroban1.4 mcg/kg/min - Check aPTT at 1245 - Follow up results of SRA which is still pending   Cristela Felt, PharmD PGY1 Pharmacy Resident [employer]: [phone]

## 2019-09-24 NOTE — Progress Notes (Signed)
[place] for Argatroban Indication: r/o HIT, r/o PE  Allergies  Allergen Reactions  . Heparin     "Heparin antibody positive; SRA pending"    Patient Measurements: Height: 5\' 8"  (172.7 cm) Weight: 153 lb 10.6 oz (69.7 kg) IBW/kg (Calculated) : 68.4 Heparin Dosing Weight:   Vital Signs: Temp: 97.7 F (36.5 C) (01/01 0945) Temp Source: Oral (01/01 0945) BP: 135/55 (01/01 0945) Pulse Rate: 70 (01/01 0945)  Labs: Recent Labs    09/22/19 0442 09/22/19 0443 09/22/19 0443 09/22/19 0942 09/23/19 0543 09/23/19 1902 09/24/19 0057 09/24/19 0600 09/24/19 1019 09/24/19 1345  HGB  --  7.6*  --   --  8.5*  --  8.1*  --   --   --   HCT  --  23.7*  --   --  26.9*  --  25.3*  --   --   --   PLT  --  58*  --  57* 64*  --  72*  --   --   --   APTT  --   --    < > 26  --   --  47* 49* 60* 58*  LABPROT  --   --   --  13.0  --  11.8  --   --   --   --   INR  --   --   --  1.0  --  0.9  --   --   --   --   CREATININE 5.12*  --   --   --  5.26*  --  4.73*  --   --   --    < > = values in this interval not displayed.    Estimated Creatinine Clearance: 15.5 mL/min (A) (by C-G formula based on SCr of 4.73 mg/dL (H)).   Medical History: No past medical history on file.  Assessment: 61 YOM on heparin SQ for VTE prophylaxis with a >50% drop in platelets on day 7 of therapy concerning for HITT. Heparin was stopped on 12/30 and HITT testing was sent.   The patient's estimated 4T score is 5 and the heparin antibody ODT was 2.961 making HITT a possibility, SRA is ordered and pending results. Noted RUE edema with dopplers negative for DVT however per discussion with MD, also with concern for possible PE. Will start argatroban for anticoagulation while awaiting further results.   Confirmatory aPTT is therapeutic at 58 (goal 50-90) on argatrobran 1.12mcg/kg/min. No reported bleeding. Hgb 8.1. Plt 72.   Goal of Therapy:  aPTT 50-90 seconds Monitor  platelets by anticoagulation protocol: Yes   Plan:  - Continue argatroban at 1.4 mcg/kg/min - Check aPTT daily - Follow up results of SRA which is still pending   Cristela Felt, PharmD PGY1 Pharmacy Resident [employer]: [phone]

## 2019-09-24 NOTE — Progress Notes (Addendum)
Patient ID: Leeum Sankey, male   DOB: 25-Nov-1955, 64 y.o.   MRN: [id number]  PROGRESS NOTE    Arael Piccione  KGM:[id number] DOB: 1956/06/12 DOA: 09/15/2019 PCP: Maryella Shivers, MD   Brief Narrative:  64 year old 64 with history of chronic renal disease stage IV, diabetes mellitus type 2, hyperlipidemia, hypertension, left carotid stenosis, intracerebral hemorrhage, status post right BKA presented to [facility] ER after a fall at home 3 days prior to presentation.  He was found to have slurred speech along with hypoxia requiring nonrebreather and significant ecchymosis over the right shoulder area.  He subsequently became more hypoxic and required intubation.  Creatinine was 10.5.  CK 2411.  Procalcitonin 0.61.  UDS negative.  He responded fairly well to IV fluids and briefly required pressor support.  He was found to have right humeral neck fracture on x-ray per ER paper chart, right upper lobe consolidation per chest x-ray, CT of the head had no acute intracranial process, diffuse paraspinous inflammation.  As per PCCM, patient was tested negative for Covid at [facility]  He was admitted under PCCM service, started on broad-spectrum antibiotics.  Nephrology and orthopedics were consulted.  He subsequently self extubated at night on 09/17/2019.  Creatinine improved with IV fluids and neurology signed off.  He was transferred to [facility] service on 09/19/2019.  Subjective: No acute issues or events overnight, patient much more awake alert oriented this morning, requesting advancement of diet will of NG tube which is certainly reasonable.  Assessment & Plan:   Acute hypoxic respiratory failure, secondary to pneumonia below, POA Rule out aspiration -Intubated on admission and self extubated on 12/20 at night.  Transferred to [facility] service on 09/19/2019.  PCCM signed off. SpO2: 97 % O2 Flow Rate (L/min): 4 L/min FiO2 (%): 40 % -Wean off oxygen as tolerated.  Incentive spirometry. -Diet as per SLP  recommendations.  Patient pulled out his cortrak - now replaced 09/22/19 -now the patient is more awake alert and oriented hoping patient can tolerate p.o. safely and core track can be removed in the next 24 hours  Right lower pneumonia, POA -Covid test was negative at [facility] -Blood cultures negative so far. -Patient received 7 days of IV Rocephin, last dose on 09/01/2019 and also received 3 days of oral Zithromax. -Currently afebrile -Monitor off antibiotics.  E. coli UTI -Has completed treatment with Rocephin  Displaced right humerus surgical neck  Fracture -Orthopedics following.  Orthopedics update on 09/22/2019 and surgical intervention will be postponed till next Tuesday (09/28/19) given thrombocytopenia which continues to improve as below -Tentative plan for reverse right shoulder arthroplasty  Right upper extremity edema -Probably secondary to humerus fracture.  Venous Doppler was negative for DVT  Thrombocytopenia, improving CBC Latest Ref Rng & Units 09/24/2019 09/23/2019 09/22/2019  WBC 4.0 - 10.5 K/uL 9.8 10.4 -  Hemoglobin 13.0 - 17.0 g/dL 8.1(L) 8.5(L) -  Hematocrit 39.0 - 52.0 % 25.3(L) 26.9(L) -  Platelets 150 - 400 K/uL 72(L) 64(L) 57(L)  - Baseline around 150 per chart review - Questionably from consumption; DC heparin.  - Now on argatroban - heparin Ab positive - Ddimer 3.4; Fibrinogen 381, INR 1.0, Protime 13.0, PTT 49  Hypernatremia -Much improved.  Monitor  AKI on probable CKD stage IV -Creatinine was around 4 in 2018 in care everywhere -Creatinine 9.68 at admission.  Nephrology has signed off.  Monitor.  Strict input and output. Lab Results  Component Value Date   CREATININE 4.73 (H) 09/24/2019   CREATININE 5.26 (H)  09/23/2019   CREATININE 5.12 (H) 09/22/2019   Anemia of chronic disease -Hemoglobin 8.5 today. -Status post 1 unit packed red cell transfusion on 09/12/2019.  Monitor  Hypertension  -Stable.  Continue Norvasc  Acute metabolic  encephalopathy/delirium, resolved -Initial CT head with no acute findings, multiple remote lacunar strokes seen on CAT scan -Monitor mental status.  Frequent reorientation.  Fall precautions. -Patient alert and oriented x4 on the morning of 09/24/2019, continue to remove sedating medications as tolerated  History of CVA -LDL low.  Lipitor on hold.  Restart Plavix once able to tolerate orally.  Generalized conditioning -Overall prognosis is guarded to poor.  Will request palliative care evaluation for goals of care discussion  DVT prophylaxis: DC heparin -now on argatroban Code Status: Full Family Communication: None at bedside Disposition Plan: We will remain inpatient for further work-up and awaiting orthopedic intervention Consultants: PCCM/nephrology/orthopedics Procedures: Intubation on presentation and self extubation on 09/17/2019 Right IJ central line placement on 09/15/2019  Antimicrobials:  Anti-infectives (From admission, onward)   Start     Dose/Rate Route Frequency Ordered Stop   09/28/19 0000  ceFAZolin (ANCEF) IVPB 2g/100 mL premix    Note to Pharmacy: Surgery delayed until 1/5.   2 g 200 mL/hr over 30 Minutes Intravenous To Surgery 09/22/19 0904 09/29/19 0000   09/22/19 0815  ceFAZolin (ANCEF) IVPB 2g/100 mL premix  Status:  Discontinued     2 g 200 mL/hr over 30 Minutes Intravenous To Surgery 09/22/19 0806 09/22/19 0905   09/15/19 1715  azithromycin (ZITHROMAX) 500 mg in sodium chloride 0.9 % 250 mL IVPB  Status:  Discontinued     500 mg 250 mL/hr over 60 Minutes Intravenous Every 24 hours 09/15/19 1711 09/18/19 1107   09/15/19 1715  cefTRIAXone (ROCEPHIN) 1 g in sodium chloride 0.9 % 100 mL IVPB     1 g 200 mL/hr over 30 Minutes Intravenous Every 24 hours 09/15/19 1711 09/21/19 1754       Objective: Vitals:   09/23/19 1700 09/23/19 1800 09/23/19 2044 09/24/19 0541  BP: 139/68 140/72 (!) 156/74 132/60  Pulse: 66 62 60 64  Resp: (!) 27 (!) _0 Temp:    98.6 F (37 C) 97.9 F (36.6 C)  TempSrc:   Axillary Oral  SpO2: 100% 100% 100% 97%  Weight:      Height:        Intake/Output Summary (Last 24 hours) at 09/24/2019 0757 Last data filed at 09/24/2019 0751 Gross per 24 hour  Intake 2625.2 ml  Output 650 ml  Net 1975.2 ml   Filed Weights   09/21/19 0407 09/22/19 0500 09/23/19 0500  Weight: 69.3 kg 70 kg 69.7 kg    Examination:  General exam: Looks chronically ill and older than stated age.  No distress.  Awake alert oriented x4. Lungs: Diminished breath sounds bilaterally, scant rales bibasilarly without overt rhonchi or wheeze Cardiovascular: S1 & S2 heard, mild intermittent bradycardia Abdomen: Abdomen is nondistended, soft and nontender. Normal bowel sounds heard. Extremities: No cyanosis, clubbing; trace lower extremity edema present.  Right BKA present.  Right upper extremity ecchymosis  Data Reviewed: I have personally reviewed following labs and imaging studies  CBC: Recent Labs  Lab 09/18/19 0353 09/19/19 0630 09/20/19 0530 09/21/19 0334 09/22/19 0443 09/22/19 0942 09/23/19 0543 09/24/19 0057  WBC 10.4 10.4 10.5 11.1* 10.9*  --  10.4 9.8  NEUTROABS 8.6* 8.4* 8.0*  --   --   --  8.1*  --   HGB  8.3* 9.6* 8.6* 7.8* 7.6*  --  8.5* 8.1*  HCT 25.6* 30.4* 27.2* 24.2* 23.7*  --  26.9* 25.3*  MCV 94.5 94.7 96.1 96.0 95.6  --  97.8 97.3  PLT 150 146* 123* 79* 58* 57* 64* 72*   Basic Metabolic Panel: Recent Labs  Lab 09/18/19 0353 09/19/19 0630 09/20/19 0530 09/21/19 0334 09/22/19 0442 09/23/19 0543 09/24/19 0057  NA 155* 150* 145 140 142 143 143  K 3.4* 3.5 3.2* 3.1* 3.5 4.2 4.5  CL 113* 109 104 103 106 107 109  CO2 _0 GLUCOSE 157* 212* 250* 308* 130* 216* 187*  BUN 105* 88* 89* 84* 86* 91* 92*  CREATININE 5.87* 5.09* 5.02* 5.17* 5.12* 5.26* 4.73*  CALCIUM 7.0* 7.8* 7.1* 6.8* 6.9* 7.4* 7.3*  MG 1.7 1.7  --  1.4* 2.2 2.1  --   PHOS 4.7* 4.1 3.7 3.9 4.0 5.4* 5.4*   GFR: Estimated  Creatinine Clearance: 15.5 mL/min (A) (by C-G formula based on SCr of 4.73 mg/dL (H)). Liver Function Tests: Recent Labs  Lab 09/20/19 0530 09/21/19 0334 09/22/19 0442 09/23/19 0543 09/24/19 0057  AST 17  --   --  20  --   ALT 17  --   --  23  --   ALKPHOS 49  --   --  56  --   BILITOT 0.5  --   --  0.5  --   PROT 4.4*  --   --  4.6*  --   ALBUMIN 1.9*  1.9* 1.7* 1.7* 1.8* 1.8*   No results for input(s): LIPASE, AMYLASE in the last 168 hours. No results for input(s): AMMONIA in the last 168 hours. Coagulation Profile: Recent Labs  Lab 09/22/19 0942 09/23/19 1902  INR 1.0 0.9   Cardiac Enzymes: Recent Labs  Lab 09/19/19 0630  CKTOTAL 298   CBG: Recent Labs  Lab 09/23/19 1509 09/23/19 2109 09/24/19 0012 09/24/19 0340 09/24/19 0713  GLUCAP 180* 202* 174* 168* 176*    Recent Results (from the past 240 hour(s))  MRSA PCR Screening     Status: Abnormal   Collection Time: 09/15/19  4:41 PM   Specimen: Nasal Mucosa; Nasopharyngeal  Result Value Ref Range Status   MRSA by PCR POSITIVE (A) NEGATIVE Final    Comment:        The GeneXpert MRSA Assay (FDA approved for NASAL specimens only), is one component of a comprehensive MRSA colonization surveillance program. It is not intended to diagnose MRSA infection nor to guide or monitor treatment for MRSA infections. RESULT CALLED TO, READ BACK BY AND VERIFIED WITH: D [facility] RN 09/15/19 2120 JDW Performed at [place] [address], [place], [place] [address]   Urine culture     Status: Abnormal   Collection Time: 09/15/19  4:47 PM   Specimen: Urine, Random  Result Value Ref Range Status   Specimen Description URINE, RANDOM  Final   Special Requests   Final    NONE Performed at [facility], [place] [address], [place], [place] [address]    Culture 80,000 COLONIES/mL ESCHERICHIA COLI (A)  Final   Report Status 09/18/2019 FINAL  Final   Organism ID, Bacteria ESCHERICHIA COLI (A)  Final       Susceptibility   Escherichia coli - MIC*    AMPICILLIN >=32 RESISTANT Resistant     CEFAZOLIN 8 SENSITIVE Sensitive     CEFTRIAXONE <=1 SENSITIVE Sensitive  CIPROFLOXACIN <=0.25 SENSITIVE Sensitive     GENTAMICIN <=1 SENSITIVE Sensitive     IMIPENEM <=0.25 SENSITIVE Sensitive     NITROFURANTOIN <=16 SENSITIVE Sensitive     TRIMETH/SULFA <=20 SENSITIVE Sensitive     AMPICILLIN/SULBACTAM 16 INTERMEDIATE Intermediate     PIP/TAZO 8 SENSITIVE Sensitive     * 80,000 COLONIES/mL ESCHERICHIA COLI  Culture, blood (routine x 2)     Status: None   Collection Time: 09/15/19  6:12 PM   Specimen: BLOOD LEFT FOREARM  Result Value Ref Range Status   Specimen Description BLOOD LEFT FOREARM  Final   Special Requests   Final    BOTTLES DRAWN AEROBIC AND ANAEROBIC Blood Culture results may not be optimal due to an inadequate volume of blood received in culture bottles   Culture   Final    NO GROWTH 5 DAYS Performed at [facility], [place] [address] [place], [place] [address]    Report Status 09/20/2019 FINAL  Final  Culture, blood (routine x 2)     Status: None   Collection Time: 09/15/19  6:17 PM   Specimen: BLOOD LEFT HAND  Result Value Ref Range Status   Specimen Description BLOOD LEFT HAND  Final   Special Requests   Final    BOTTLES DRAWN AEROBIC AND ANAEROBIC Blood Culture results may not be optimal due to an inadequate volume of blood received in culture bottles   Culture   Final    NO GROWTH 5 DAYS Performed at [facility], [place] [address], [place], [place] [address]    Report Status 09/20/2019 FINAL  Final  SARS CORONAVIRUS 2 (TAT 6-24 HRS) Nasopharyngeal Nasopharyngeal Swab     Status: None   Collection Time: 09/20/19  5:01 PM   Specimen: Nasopharyngeal Swab  Result Value Ref Range Status   SARS Coronavirus 2 NEGATIVE NEGATIVE Final    Comment: (NOTE) SARS-CoV-2 target nucleic acids are NOT DETECTED. The SARS-CoV-2 RNA is generally detectable in upper and  lower respiratory specimens during the acute phase of infection. Negative results do not preclude SARS-CoV-2 infection, do not rule out co-infections with other pathogens, and should not be used as the sole basis for treatment or other patient management decisions. Negative results must be combined with clinical observations, patient history, and epidemiological information. The expected result is Negative. Fact Sheet for Patients: SugarRoll.be Fact Sheet for Healthcare Providers: https://www.woods-mathews.com/ This test is not yet approved or cleared by the [place] FDA and  has been authorized for detection and/or diagnosis of SARS-CoV-2 by FDA under an Emergency Use Authorization (EUA). This EUA will remain  in effect (meaning this test can be used) for the duration of the COVID-19 declaration under Section 56 4(b)(1) of the Act, 21 U.S.C. section 360bbb-3(b)(1), unless the authorization is terminated or revoked sooner. Performed at [facility], [place] [address], [place], [place] [address]   Surgical pcr screen     Status: Abnormal   Collection Time: 09/21/19  3:02 PM   Specimen: Nasal Mucosa; Nasal Swab  Result Value Ref Range Status   MRSA, PCR POSITIVE (A) NEGATIVE Final   Staphylococcus aureus POSITIVE (A) NEGATIVE Final    Comment: (NOTE) The Xpert SA Assay (FDA approved for NASAL specimens in patients 71 years of age and older), is one component of a comprehensive surveillance program. It is not intended to diagnose infection nor to guide or monitor treatment. Performed at [facility], [place] [address] [place], [place]  [address]      Radiology Studies: DG Abd Portable 1V  Result Date: 09/22/2019 CLINICAL DATA:  Feeding tube placement EXAM: PORTABLE ABDOMEN - 1 VIEW COMPARISON:  None. FINDINGS: Feeding tube with the tip projecting over the antrum of the stomach. There is oral contrast in the colon. There is no  bowel dilatation to suggest obstruction. There is no evidence of pneumoperitoneum, portal venous gas or pneumatosis. There are no pathologic calcifications along the expected course of the ureters. The osseous structures are unremarkable. IMPRESSION: Feeding tube with the tip projecting over the antrum of the stomach. Electronically Signed   By: Kathreen Devoid   On: 09/22/2019 14:56    Scheduled Meds: . sodium chloride   Intravenous Once  . amLODipine  10 mg Per Tube Daily  . chlorhexidine  60 mL Topical Once  . chlorhexidine gluconate (MEDLINE KIT)  15 mL Mouth Rinse BID  . Chlorhexidine Gluconate Cloth  6 each Topical Daily  . darbepoetin (ARANESP) injection - NON-DIALYSIS  100 mcg Subcutaneous Q Thu-1800  . feeding supplement (PRO-STAT SUGAR FREE 64)  30 mL Per Tube TID  . free water  300 mL Per Tube Q6H  . insulin aspart  0-9 Units Subcutaneous Q4H  . mupirocin ointment   Nasal BID  . pantoprazole (PROTONIX) IV  40 mg Intravenous QHS   Continuous Infusions: . sodium chloride 10 mL/hr at 09/21/19 1100  . argatroban 1.2 mcg/kg/min (09/24/19 0645)  . [START ON 09/28/2019]  ceFAZolin (ANCEF) IV    . feeding supplement (OSMOLITE 1.2 CAL) 0 mL (09/23/19 1104)    Little Ishikawa, DO [facility] 09/24/2019, 7:57 AM

## 2019-09-24 NOTE — Progress Notes (Signed)
[place] for Argatroban Indication: r/o HIT, r/o PE  Allergies  Allergen Reactions  . Heparin     "Heparin antibody positive; SRA pending"    Patient Measurements: Height: 5\' 8"  (172.7 cm) Weight: 153 lb 10.6 oz (69.7 kg) IBW/kg (Calculated) : 68.4 Heparin Dosing Weight:   Vital Signs: Temp: 98.6 F (37 C) (12/31 2044) Temp Source: Axillary (12/31 2044) BP: 156/74 (12/31 2044) Pulse Rate: 60 (12/31 2044)  Labs: Recent Labs    09/22/19 0442 09/22/19 0443 09/22/19 0942 09/23/19 0543 09/23/19 1902 09/24/19 0057  HGB  --  7.6*  --  8.5*  --  8.1*  HCT  --  23.7*  --  26.9*  --  25.3*  PLT  --  58* 57* 64*  --  72*  APTT  --   --  26  --   --  47*  LABPROT  --   --  13.0  --  11.8  --   INR  --   --  1.0  --  0.9  --   CREATININE 5.12*  --   --  5.26*  --  4.73*    Estimated Creatinine Clearance: 15.5 mL/min (A) (by C-G formula based on SCr of 4.73 mg/dL (H)).   Medical History: No past medical history on file.  Assessment: 61 YOM on heparin SQ for VTE prophylaxis with a >50% drop in platelets on day 7 of therapy concerning for HITT. Heparin was stopped on 12/30 and HITT testing was sent.   The patient's estimated 4T score is 5 and the heparin antibody ODT was 2.961 making HITT a possibility, SRA is ordered and pending results. Noted RUE edema with dopplers negative for DVT however per discussion with MD, also with concern for possible PE. Will start argatroban for anticoagulation while awaiting further results.   1/1 AM update:  APTT is just below goal Slight inc in plts this AM (64>72)  Goal of Therapy:  aPTT 50-90 seconds Monitor platelets by anticoagulation protocol: Yes   Plan:  -Inc Argatroban to a rate of 1.2 mcg/kg/min -Re-check aPTT at [place], PharmD, [place] Pharmacist Phone: [phone]

## 2019-09-24 NOTE — Progress Notes (Signed)
Found cortrak tube feed leaking from a small whole midway from the port. Apparently patient bit into the cortrak tube. Jeannette Corpus, NP notified. Will put the tube feed on hold for now until IR can replace tube.

## 2019-09-24 NOTE — Progress Notes (Signed)
  Speech Language Pathology Treatment: Dysphagia  Patient Details Name: Ryan Villa MRN: [id number] DOB: [birth year] Today's Date: 09/24/2019 Time: [id number] SLP Time Calculation (min) (ACUTE ONLY): 26 min  Assessment / Plan / Recommendation Clinical Impression  Pt partially reclined upon SLP entrance to room.  SLP assisted RN to reposition pt to be sitting upright for po trials as SlP informed MD wanted him to be reevaluated.  After oral care (provided by SlP with oral suction toothbrush), po trials commenced. Immediate cough before and post-swallow of single ice chip boluses x3 with wet voice that eventually cleared with moderate verbal cues to clear throat clearing/cough.  Unfortunately, pt's reflexive cough did not expel possible secretions and/or water from ice retained in pharynx or larynx = concerning for airway protection with po intake.   SLP educated pt to his MBS vs normal MBS showing him video loops and clinical reasoning he is npo x oral care at this time.  He demonstrates significant aspiration and retention sans sensation on his MBS 12/28  Pt denies dysphagia prior to admit but given h/o CVA, very rapid rate of speech - likely pt with some baseline dysphagia/dysarthria.  In addition, per [facility] note in chart, pt was receiving [place] for dysarthria in 2018 - ? If dysarthria was from CVA?  Of note, pt admits he speaks and eats at rapid rate even prior to admit.  He also states he did not work outside of the home and his mother takes care of him?  Pt was oriented to self but no location or situation during this session.  Question baseline function given previously listed findings.   At this time, recommend continue NPO with oral care.  Given improved mentation, goal will be to repeat MBS tomorrow in hopes to transition pt into po intake.  At this time, would not advise to remove his Cortrak as he continues to demonstrates significant symptoms of dysphagia/decreased airway protection.    Advised RN to  recommendation and to SLP setting up oral suction in room for pt.  Thanks.    HPI HPI: 64 year old male transferred from [facility] for acute respiratory failure, rhabdomyolysis (fell and laid on floor 3 days), acute kidney injury. PMH: CKD stage IV, diabetes mellitus type 2, hyperlipidemia, hypertension, left carotid stenosis, intracerebral hemorrhage, status post right BKA. Per chart EMS reported that the patient's sister was bringing him food while he was lying on the floor during that 3-day period. Intubated 12/23-12/24 (self extubated). Found to have right humeral neck fracture and right upper lobe consolidation per chest x-ray, head CT no acute intracranial process,   Pt underwent MBS 12/28 with findings of severe dysphagia - asp of nectar/honey thick.  Repeat MBS advised prior to po administration given silent nature of dysphagia. SLP notified by RN that MD wants pt reevaluated as he is more alert with plan for Cortrak to be removed.      SLP Plan  MBS(09/25/2019)       Recommendations  Diet recommendations: NPO Medication Administration: (via Cortrak)                Oral Care Recommendations: Oral care QID Follow up Recommendations: Skilled Nursing facility SLP Visit Diagnosis: Dysphagia, pharyngeal phase (R13.13) Plan: MBS(09/25/2019)       GO                Ryan Villa 09/24/2019, 4:39 PM   Kathleen Lime, MS [place] Office [phone]

## 2019-09-24 NOTE — Progress Notes (Signed)
Orthopedics Progress Note  Subjective: Patient resting comfortably  Objective:  Vitals:   09/24/19 0541 09/24/19 0945  BP: 132/60 (!) 135/55  Pulse: 64 70  Resp: 20 18  Temp: 97.9 F (36.6 C) 97.7 F (36.5 C)  SpO2: 97% 94%    General: Awake and alert  Musculoskeletal: right shoulder remains swollen and bruised Skin tear stable right forearm Neurovascularly intact  Lab Results  Component Value Date   WBC 9.8 09/24/2019   HGB 8.1 (L) 09/24/2019   HCT 25.3 (L) 09/24/2019   MCV 97.3 09/24/2019   PLT 72 (L) 09/24/2019       Component Value Date/Time   NA 143 09/24/2019 0057   K 4.5 09/24/2019 0057   CL 109 09/24/2019 0057   CO2 28 09/24/2019 0057   GLUCOSE 187 (H) 09/24/2019 0057   BUN 92 (H) 09/24/2019 0057   CREATININE 4.73 (H) 09/24/2019 0057   CALCIUM 7.3 (L) 09/24/2019 0057   GFRNONAA 12 (L) 09/24/2019 0057   GFRAA 14 (L) 09/24/2019 0057    Lab Results  Component Value Date   INR 0.9 09/23/2019   INR 1.0 09/22/2019    Assessment/Plan: Plan for  RIGHT REVERSE SHOULDER ARTHROPLASTY this Tuesday Medical optimization and local skin care  Remo Lipps R. Veverly Fells, MD 09/24/2019 9:52 AM

## 2019-09-24 NOTE — Progress Notes (Signed)
[place] for Argatroban Indication: r/o HIT, r/o PE  Allergies  Allergen Reactions  . Heparin     "Heparin antibody positive; SRA pending"    Patient Measurements: Height: 5\' 8"  (172.7 cm) Weight: 153 lb 10.6 oz (69.7 kg) IBW/kg (Calculated) : 68.4 Heparin Dosing Weight:   Vital Signs: Temp: 97.9 F (36.6 C) (01/01 0541) Temp Source: Oral (01/01 0541) BP: 132/60 (01/01 0541) Pulse Rate: 64 (01/01 0541)  Labs: Recent Labs    09/22/19 0442 09/22/19 0443 09/22/19 0942 09/23/19 0543 09/23/19 1902 09/24/19 0057 09/24/19 0600  HGB  --  7.6*  --  8.5*  --  8.1*  --   HCT  --  23.7*  --  26.9*  --  25.3*  --   PLT  --  58* 57* 64*  --  72*  --   APTT  --   --  26  --   --  47* 49*  LABPROT  --   --  13.0  --  11.8  --   --   INR  --   --  1.0  --  0.9  --   --   CREATININE 5.12*  --   --  5.26*  --  4.73*  --     Estimated Creatinine Clearance: 15.5 mL/min (A) (by C-G formula based on SCr of 4.73 mg/dL (H)).   Medical History: No past medical history on file.  Assessment: 58 YOM on heparin SQ for VTE prophylaxis with a >50% drop in platelets on day 7 of therapy concerning for HITT. Heparin was stopped on 12/30 and HITT testing was sent.   The patient's estimated 4T score is 5 and the heparin antibody ODT was 2.961 making HITT a possibility, SRA is ordered and pending results. Noted RUE edema with dopplers negative for DVT however per discussion with MD, also with concern for possible PE. Will start argatroban for anticoagulation while awaiting further results.   aPTT subtherapeutic at 49 (goal 50-90) on argatrobran 1.71mcg/kg/min. No reported bleeding. Hgb 8.1. Plt 72.   Goal of Therapy:  aPTT 50-90 seconds Monitor platelets by anticoagulation protocol: Yes   Plan:  - Increase argatroban to a rate of 1.4 mcg/kg/min - Check aPTT at 1030 - Follow up results of SRA which is still pending   Cristela Felt, PharmD PGY1 Pharmacy  Resident [employer]: [phone]

## 2019-09-25 ENCOUNTER — Inpatient Hospital Stay (HOSPITAL_COMMUNITY): Payer: Medicare Other

## 2019-09-25 LAB — GLUCOSE, CAPILLARY
Glucose-Capillary: 134 mg/dL — ABNORMAL HIGH (ref 70–99)
Glucose-Capillary: 144 mg/dL — ABNORMAL HIGH (ref 70–99)
Glucose-Capillary: 149 mg/dL — ABNORMAL HIGH (ref 70–99)
Glucose-Capillary: 167 mg/dL — ABNORMAL HIGH (ref 70–99)
Glucose-Capillary: 184 mg/dL — ABNORMAL HIGH (ref 70–99)
Glucose-Capillary: 244 mg/dL — ABNORMAL HIGH (ref 70–99)
Glucose-Capillary: 264 mg/dL — ABNORMAL HIGH (ref 70–99)

## 2019-09-25 LAB — CBC
HCT: 26.7 % — ABNORMAL LOW (ref 39.0–52.0)
Hemoglobin: 8.3 g/dL — ABNORMAL LOW (ref 13.0–17.0)
MCH: 30.5 pg (ref 26.0–34.0)
MCHC: 31.1 g/dL (ref 30.0–36.0)
MCV: 98.2 fL (ref 80.0–100.0)
Platelets: 103 10*3/uL — ABNORMAL LOW (ref 150–400)
RBC: 2.72 MIL/uL — ABNORMAL LOW (ref 4.22–5.81)
RDW: 14.7 % (ref 11.5–15.5)
WBC: 9.4 10*3/uL (ref 4.0–10.5)
nRBC: 0 % (ref 0.0–0.2)

## 2019-09-25 LAB — RENAL FUNCTION PANEL
Albumin: 1.9 g/dL — ABNORMAL LOW (ref 3.5–5.0)
Anion gap: 11 (ref 5–15)
BUN: 88 mg/dL — ABNORMAL HIGH (ref 8–23)
CO2: 26 mmol/L (ref 22–32)
Calcium: 7.6 mg/dL — ABNORMAL LOW (ref 8.9–10.3)
Chloride: 111 mmol/L (ref 98–111)
Creatinine, Ser: 4.52 mg/dL — ABNORMAL HIGH (ref 0.61–1.24)
GFR calc Af Amer: 15 mL/min — ABNORMAL LOW (ref >60)
GFR calc non Af Amer: 13 mL/min — ABNORMAL LOW (ref >60)
Glucose, Bld: 147 mg/dL — ABNORMAL HIGH (ref 70–99)
Phosphorus: 4.6 mg/dL (ref 2.5–4.6)
Potassium: 4.9 mmol/L (ref 3.5–5.1)
Sodium: 148 mmol/L — ABNORMAL HIGH (ref 135–145)

## 2019-09-25 LAB — APTT: aPTT: 65 seconds — ABNORMAL HIGH (ref 24–36)

## 2019-09-25 NOTE — Progress Notes (Addendum)
[place] for Argatroban Indication: r/o HIT, r/o PE  Allergies  Allergen Reactions  . Heparin     "Heparin antibody positive; SRA pending"    Patient Measurements: Height: 5\' 8"  (172.7 cm) Weight: 150 lb 2.1 oz (68.1 kg) IBW/kg (Calculated) : 68.4  Vital Signs: Temp: 98.1 F (36.7 C) (01/02 0533) Temp Source: Oral (01/02 0533) BP: 110/88 (01/02 0533) Pulse Rate: 67 (01/02 0533)  Labs: Recent Labs    09/22/19 0942 09/22/19 0942 09/23/19 0543 09/23/19 1902 09/24/19 0057 09/24/19 1019 09/24/19 1345 09/25/19 0505  HGB  --    < > 8.5*  --  8.1*  --   --  8.3*  HCT  --   --  26.9*  --  25.3*  --   --  26.7*  PLT 57*  --  64*  --  72*  --   --  103*  APTT 26  --   --   --  47* 60* 58* 65*  LABPROT 13.0  --   --  11.8  --   --   --   --   INR 1.0  --   --  0.9  --   --   --   --   CREATININE  --   --  5.26*  --  4.73*  --   --  4.52*   < > = values in this interval not displayed.    Estimated Creatinine Clearance: 16.1 mL/min (A) (by C-G formula based on SCr of 4.52 mg/dL (H)).   Medical History: No past medical history on file.  Assessment: 78 YOM on heparin SQ for VTE prophylaxis with a >50% drop in platelets on day 7 of therapy concerning for HITT. Heparin was stopped on 12/30 and HITT testing was sent.   The patient's estimated 4T score is 5 and the heparin antibody ODT was 2.961 making HITT a possibility, SRA is ordered and pending results. Noted RUE edema with dopplers negative for DVT however per discussion with MD, also with concern for possible PE. Will start argatroban for anticoagulation while awaiting further results.   Daily aPTT is therapeutic at 65 (goal 50-90) on argatrobran 1.26mcg/kg/min. No reported bleeding. Hgb 8.3. Plt 103, which increased from 72.   Goal of Therapy:  aPTT 50-90 seconds Monitor platelets by anticoagulation protocol: Yes   Plan:  - Continue argatroban at 1.4 mcg/kg/min - Check aPTT  daily - Follow up results of SRA which is still pending  - Follow up ability to obtain CTA to evaluate presence of PE  Cristela Felt, PharmD PGY1 Pharmacy Resident [employer]: [phone]

## 2019-09-25 NOTE — Progress Notes (Signed)
Modified Barium Swallow Progress Note  Patient Details  Name: Ryan Villa MRN: [id number] Date of Birth: 01-31-1956  Today's Date: 09/25/2019  Modified Barium Swallow completed.  Full report located under Chart Review in the Imaging Section.  Brief recommendations include the following:  Clinical Impression  Pt presents with ongoing dysphagia, though after cortrak removal pt improved (cortrak not functional, had been bitten, discussed with MD). Primary deficit is decreased oral coordination with pumping bolus proplusion leading to premature spill/delay in swallow initaition. If the head of the bolus passes the tip of the epiglottis it is consistently aspirated during the swallow without sensation. However two strategies were effective to eliminate aspiration of nectar thick liquids, puree and solids (not thin): 1) reducing bolus size to 1/2 teaspoon (pt consistently aspirated a full teaspoon of puree) or 2) chin tuck without bolus size regulation as valleculae captured bolus. Chin tuck also reduced residue in valleculae. Initially, pt was not capable of this strategy, with with multimodal cueing he was 100% successful by end of session with only a verbal cue. he will need full staff supervision with PO to safely begin a diet. Recommend Dys 3/nectar thick liquids with a chin tuck at all times. If pt has wet vocal quality, cue throat clearing and dry swallow. Will f/u for interventions.    Swallow Evaluation Recommendations       SLP Diet Recommendations: Dysphagia 3 (Mech soft) solids;Nectar thick liquid   Liquid Administration via: Straw   Medication Administration: Whole meds with puree   Supervision: Full assist for feeding;Staff to assist with self feeding;Full supervision/cueing for compensatory strategies   Compensations: Chin tuck;Clear throat intermittently               Herbie Baltimore, MA CCC-SLP  Acute Rehabilitation Services Pager [phone] Office  [phone]  Traves Majchrzak, Katherene Ponto 09/25/2019,10:53 AM

## 2019-09-25 NOTE — Progress Notes (Signed)
Patient ID: Ryan Villa, male   DOB: [birth year], 64 y.o.   MRN: [id number]  PROGRESS NOTE    Ryan Villa  [name] DOB: 1956-03-21 DOA: 09/15/2019 PCP: Ryan Shivers, MD   Brief Narrative:  64 year old 64 with history of chronic renal disease stage IV, diabetes mellitus type 2, hyperlipidemia, hypertension, left carotid stenosis, intracerebral hemorrhage, status post right BKA presented to [facility] ER after a fall at home 3 days prior to presentation.  He was found to have slurred speech along with hypoxia requiring nonrebreather and significant ecchymosis over the right shoulder area.  He subsequently became more hypoxic and required intubation.  Creatinine was 10.5.  CK 2411.  Procalcitonin 0.61.  UDS negative.  He responded fairly well to IV fluids and briefly required pressor support.  He was found to have right humeral neck fracture on x-ray per ER paper chart, right upper lobe consolidation per chest x-ray, CT of the head had no acute intracranial process, diffuse paraspinous inflammation.  As per PCCM, patient was tested negative for Covid at [facility].  He was admitted under PCCM service, started on broad-spectrum antibiotics.  Nephrology and orthopedics were consulted.  He subsequently self extubated at night on 09/17/2019.  Creatinine improved with IV fluids and neurology signed off.  He was transferred to [facility] service on 09/19/2019.  Subjective: No acute issues or events overnight, patient remains more awake and alert, currently doing down for modified barium swallow, declines headache, fever, chills, nausea, vomiting, diarrhea, constipation.  Assessment & Plan:    Acute hypoxic respiratory failure, secondary to pneumonia below, POA Rule out aspiration -Intubated on admission and self extubated on 12/20 overnight.  Transferred to [facility] service on 09/19/2019.  PCCM signed off. SpO2: 93 % O2 Flow Rate (L/min): 4 L/min FiO2 (%): 40 % -Wean off oxygen as tolerated.  Incentive  spirometry. -Given elevated D-dimer we will continue anticoagulation and rule out PE given ongoing hypoxia, awaiting creatinine/kidney function improve -Diet as per SLP recommendations.  Patient pulled out his cortrak - now replaced 09/22/19 -attempting to undergo modified barium, core track interfering subsequently removed 09/25/19  Right lower pneumonia, POA -Covid test was negative at [facility] -Blood cultures negative so far. -Patient received 7 days of IV Rocephin, last dose on 09/01/2019 and also received 3 days of oral Zithromax. -Currently afebrile -Monitor off antibiotics.  E. coli UTI -Has completed treatment with Rocephin  Displaced right humerus surgical neck  Fracture -Orthopedics following.  Orthopedics update on 09/22/2019 and surgical intervention will be postponed till next Tuesday (09/28/19) given thrombocytopenia which continues to improve as below -Tentative plan for reverse right shoulder arthroplasty  Right upper extremity edema -Probably secondary to humerus fracture.  Venous Doppler was negative for DVT  Thrombocytopenia, improving CBC Latest Ref Rng & Units 09/25/2019 09/24/2019 09/23/2019  WBC 4.0 - 10.5 K/uL 9.4 9.8 10.4  Hemoglobin 13.0 - 17.0 g/dL 8.3(L) 8.1(L) 8.5(L)  Hematocrit 39.0 - 52.0 % 26.7(L) 25.3(L) 26.9(L)  Platelets 150 - 400 K/uL 103(L) 72(L) 64(L)  - Baseline around 150 per chart review - Questionably from consumption; DC heparin.  - Now on argatroban - heparin Ab positive - Ddimer 3.4; Fibrinogen 381, INR 1.0, Protime 13.0, PTT 49 -Tentatively planning for further imaging once creatinine improves  Hypernatremia -Much improved.  Monitor  AKI on probable CKD stage IV -Creatinine was around 4 in 2018 in care everywhere -Creatinine 9.68 at admission.  Nephrology has signed off.  Monitor.  Strict input and output. Lab Results  Component Value  Date   CREATININE 4.52 (H) 09/25/2019   CREATININE 4.73 (H) 09/24/2019   CREATININE 5.26 (H)  09/23/2019   Anemia of chronic disease -Hemoglobin 8.5 today. -Status post 1 unit packed red cell transfusion on 09/12/2019.  Monitor  Hypertension  -Stable.  Continue Norvasc  Acute metabolic encephalopathy/delirium, resolved -Initial CT head with no acute findings, multiple remote lacunar strokes seen on CAT scan -Monitor mental status.  Frequent reorientation.  Fall precautions. -Patient alert and oriented x4 on the morning of 09/24/2019, continue to remove sedating medications as tolerated  History of CVA -LDL low.  Lipitor on hold.  Restart Plavix once able to tolerate orally.  Generalized conditioning -Overall prognosis is guarded to poor.  Will request palliative care evaluation for goals of care discussion  DVT prophylaxis: DC heparin -now on argatroban Code Status: Full Family Communication: None at bedside Disposition Plan: We will remain inpatient for further work-up and awaiting orthopedic intervention Consultants: PCCM/nephrology/orthopedics Procedures: Intubation on presentation and self extubation on 09/17/2019 Right IJ central line placement on 09/15/2019  Antimicrobials:  Anti-infectives (From admission, onward)   Start     Dose/Rate Route Frequency Ordered Stop   09/28/19 0000  ceFAZolin (ANCEF) IVPB 2g/100 mL premix    Note to Pharmacy: Surgery delayed until 1/5.   2 g 200 mL/hr over 30 Minutes Intravenous To Surgery 09/22/19 0904 09/29/19 0000   09/22/19 0815  ceFAZolin (ANCEF) IVPB 2g/100 mL premix  Status:  Discontinued     2 g 200 mL/hr over 30 Minutes Intravenous To Surgery 09/22/19 0806 09/22/19 0905   09/15/19 1715  azithromycin (ZITHROMAX) 500 mg in sodium chloride 0.9 % 250 mL IVPB  Status:  Discontinued     500 mg 250 mL/hr over 60 Minutes Intravenous Every 24 hours 09/15/19 1711 09/18/19 1107   09/15/19 1715  cefTRIAXone (ROCEPHIN) 1 g in sodium chloride 0.9 % 100 mL IVPB     1 g 200 mL/hr over 30 Minutes Intravenous Every 24 hours 09/15/19 1711  09/21/19 1754       Objective: Vitals:   09/24/19 0945 09/24/19 1639 09/24/19 2042 09/25/19 0533  BP: (!) 135/55 (!) 157/54 (!) 116/56 110/88  Pulse: 70 66 72 67  Resp: '18 18 18 16  ' Temp: 97.7 F (36.5 C) 98.9 F (37.2 C) 98.3 F (36.8 C) 98.1 F (36.7 C)  TempSrc: Oral Oral Oral Oral  SpO2: 94% 95% 92% 93%  Weight:   68.1 kg   Height:        Intake/Output Summary (Last 24 hours) at 09/25/2019 7253 Last data filed at 09/25/2019 0600 Gross per 24 hour  Intake 209.9 ml  Output 2850 ml  Net -2640.1 ml   Filed Weights   09/22/19 0500 09/23/19 0500 09/24/19 2042  Weight: 70 kg 69.7 kg 68.1 kg    Examination:  General exam: Looks chronically ill and older than stated age.  No distress.  Awake alert oriented x4. Lungs: Diminished breath sounds bilaterally, scant rales bibasilarly without overt rhonchi or wheeze Cardiovascular: S1 & S2 heard, mild intermittent bradycardia Abdomen: Abdomen is nondistended, soft and nontender. Normal bowel sounds heard. Extremities: No cyanosis, clubbing; trace lower extremity edema present.  Right BKA present.  Right upper extremity ecchymosis  Data Reviewed: I have personally reviewed following labs and imaging studies  CBC: Recent Labs  Lab 09/19/19 0630 09/20/19 0530 09/21/19 0334 09/22/19 0443 09/22/19 0942 09/23/19 0543 09/24/19 0057 09/25/19 0505  WBC 10.4 10.5 11.1* 10.9*  --  10.4 9.8 9.4  NEUTROABS 8.4* 8.0*  --   --   --  8.1*  --   --   HGB 9.6* 8.6* 7.8* 7.6*  --  8.5* 8.1* 8.3*  HCT 30.4* 27.2* 24.2* 23.7*  --  26.9* 25.3* 26.7*  MCV 94.7 96.1 96.0 95.6  --  97.8 97.3 98.2  PLT 146* 123* 79* 58* 57* 64* 72* 010*   Basic Metabolic Panel: Recent Labs  Lab 09/19/19 0630 09/21/19 0334 09/22/19 0442 09/23/19 0543 09/24/19 0057 09/25/19 0505  NA 150* 140 142 143 143 148*  K 3.5 3.1* 3.5 4.2 4.5 4.9  CL 109 103 106 107 109 111  CO2 '29 28 26 26 28 26  ' GLUCOSE 212* 308* 130* 216* 187* 147*  BUN 88* 84* 86* 91* 92*  88*  CREATININE 5.09* 5.17* 5.12* 5.26* 4.73* 4.52*  CALCIUM 7.8* 6.8* 6.9* 7.4* 7.3* 7.6*  MG 1.7 1.4* 2.2 2.1  --   --   PHOS 4.1 3.9 4.0 5.4* 5.4* 4.6   GFR: Estimated Creatinine Clearance: 16.1 mL/min (A) (by C-G formula based on SCr of 4.52 mg/dL (H)). Liver Function Tests: Recent Labs  Lab 09/20/19 0530 09/21/19 0334 09/22/19 0442 09/23/19 0543 09/24/19 0057 09/25/19 0505  AST 17  --   --  20  --   --   ALT 17  --   --  23  --   --   ALKPHOS 49  --   --  56  --   --   BILITOT 0.5  --   --  0.5  --   --   PROT 4.4*  --   --  4.6*  --   --   ALBUMIN 1.9*  1.9* 1.7* 1.7* 1.8* 1.8* 1.9*   No results for input(s): LIPASE, AMYLASE in the last 168 hours. No results for input(s): AMMONIA in the last 168 hours. Coagulation Profile: Recent Labs  Lab 09/22/19 0942 09/23/19 1902  INR 1.0 0.9   Cardiac Enzymes: Recent Labs  Lab 09/19/19 0630  CKTOTAL 298   CBG: Recent Labs  Lab 09/24/19 1121 09/24/19 1636 09/24/19 2000 09/25/19 0000 09/25/19 0405  GLUCAP 215* 221* 170* 134* 144*    Recent Results (from the past 240 hour(s))  MRSA PCR Screening     Status: Abnormal   Collection Time: 09/15/19  4:41 PM   Specimen: Nasal Mucosa; Nasopharyngeal  Result Value Ref Range Status   MRSA by PCR POSITIVE (A) NEGATIVE Final    Comment:        The GeneXpert MRSA Assay (FDA approved for NASAL specimens only), is one component of a comprehensive MRSA colonization surveillance program. It is not intended to diagnose MRSA infection nor to guide or monitor treatment for MRSA infections. RESULT CALLED TO, READ BACK BY AND VERIFIED WITH: D [facility] RN 09/15/19 2120 JDW Performed at [facility] [address] [place], [place] [address]   Urine culture     Status: Abnormal   Collection Time: 09/15/19  4:47 PM   Specimen: Urine, Random  Result Value Ref Range Status   Specimen Description URINE, RANDOM  Final   Special Requests   Final    NONE Performed at  [facility], [place] [address] [place], [place] [address]    Culture 80,000 COLONIES/mL ESCHERICHIA COLI (A)  Final   Report Status 09/18/2019 FINAL  Final   Organism ID, Bacteria ESCHERICHIA COLI (A)  Final      Susceptibility   Escherichia coli -  MIC*    AMPICILLIN >=32 RESISTANT Resistant     CEFAZOLIN 8 SENSITIVE Sensitive     CEFTRIAXONE <=1 SENSITIVE Sensitive     CIPROFLOXACIN <=0.25 SENSITIVE Sensitive     GENTAMICIN <=1 SENSITIVE Sensitive     IMIPENEM <=0.25 SENSITIVE Sensitive     NITROFURANTOIN <=16 SENSITIVE Sensitive     TRIMETH/SULFA <=20 SENSITIVE Sensitive     AMPICILLIN/SULBACTAM 16 INTERMEDIATE Intermediate     PIP/TAZO 8 SENSITIVE Sensitive     * 80,000 COLONIES/mL ESCHERICHIA COLI  Culture, blood (routine x 2)     Status: None   Collection Time: 09/15/19  6:12 PM   Specimen: BLOOD LEFT FOREARM  Result Value Ref Range Status   Specimen Description BLOOD LEFT FOREARM  Final   Special Requests   Final    BOTTLES DRAWN AEROBIC AND ANAEROBIC Blood Culture results may not be optimal due to an inadequate volume of blood received in culture bottles   Culture   Final    NO GROWTH 5 DAYS Performed at [facility], [place] [address], [place], [place] [address]    Report Status 09/20/2019 FINAL  Final  Culture, blood (routine x 2)     Status: None   Collection Time: 09/15/19  6:17 PM   Specimen: BLOOD LEFT HAND  Result Value Ref Range Status   Specimen Description BLOOD LEFT HAND  Final   Special Requests   Final    BOTTLES DRAWN AEROBIC AND ANAEROBIC Blood Culture results may not be optimal due to an inadequate volume of blood received in culture bottles   Culture   Final    NO GROWTH 5 DAYS Performed at [facility], [place] [address], [place], [place] [address]    Report Status 09/20/2019 FINAL  Final  SARS CORONAVIRUS 2 (TAT 6-24 HRS) Nasopharyngeal Nasopharyngeal Swab     Status: None   Collection Time: 09/20/19  5:01 PM   Specimen: Nasopharyngeal  Swab  Result Value Ref Range Status   SARS Coronavirus 2 NEGATIVE NEGATIVE Final    Comment: (NOTE) SARS-CoV-2 target nucleic acids are NOT DETECTED. The SARS-CoV-2 RNA is generally detectable in upper and lower respiratory specimens during the acute phase of infection. Negative results do not preclude SARS-CoV-2 infection, do not rule out co-infections with other pathogens, and should not be used as the sole basis for treatment or other patient management decisions. Negative results must be combined with clinical observations, patient history, and epidemiological information. The expected result is Negative. Fact Sheet for Patients: SugarRoll.be Fact Sheet for Healthcare Providers: https://www.woods-mathews.com/ This test is not yet approved or cleared by the [place] FDA and  has been authorized for detection and/or diagnosis of SARS-CoV-2 by FDA under an Emergency Use Authorization (EUA). This EUA will remain  in effect (meaning this test can be used) for the duration of the COVID-19 declaration under Section 56 4(b)(1) of the Act, 21 U.S.C. section 360bbb-3(b)(1), unless the authorization is terminated or revoked sooner. Performed at [facility], [place] [address], [place], [place] [address]   Surgical pcr screen     Status: Abnormal   Collection Time: 09/21/19  3:02 PM   Specimen: Nasal Mucosa; Nasal Swab  Result Value Ref Range Status   MRSA, PCR POSITIVE (A) NEGATIVE Final   Staphylococcus aureus POSITIVE (A) NEGATIVE Final    Comment: (NOTE) The Xpert SA Assay (FDA approved for NASAL specimens in patients 26 years of age and older), is one component of a  comprehensive surveillance program. It is not intended to diagnose infection nor to guide or monitor treatment. Performed at [facility], [place] [address], [place], [place] [address]      Radiology Studies: No results found.  Scheduled Meds: . sodium chloride    Intravenous Once  . amLODipine  10 mg Per Tube Daily  . chlorhexidine  60 mL Topical Once  . chlorhexidine gluconate (MEDLINE KIT)  15 mL Mouth Rinse BID  . Chlorhexidine Gluconate Cloth  6 each Topical Daily  . darbepoetin (ARANESP) injection - NON-DIALYSIS  100 mcg Subcutaneous Q Thu-1800  . feeding supplement (PRO-STAT SUGAR FREE 64)  30 mL Per Tube TID  . free water  300 mL Per Tube Q6H  . insulin aspart  0-9 Units Subcutaneous Q4H  . mupirocin ointment   Nasal BID  . pantoprazole (PROTONIX) IV  40 mg Intravenous QHS   Continuous Infusions: . sodium chloride 10 mL/hr at 09/21/19 1100  . argatroban 1.4 mcg/kg/min (09/25/19 0517)  . [START ON 09/28/2019]  ceFAZolin (ANCEF) IV    . feeding supplement (OSMOLITE 1.2 CAL) 0 mL (09/23/19 1104)    Little Ishikawa, DO [facility] 09/25/2019, 7:13 AM

## 2019-09-26 LAB — CBC
HCT: 26.8 % — ABNORMAL LOW (ref 39.0–52.0)
Hemoglobin: 8.3 g/dL — ABNORMAL LOW (ref 13.0–17.0)
MCH: 30.7 pg (ref 26.0–34.0)
MCHC: 31 g/dL (ref 30.0–36.0)
MCV: 99.3 fL (ref 80.0–100.0)
Platelets: 130 10*3/uL — ABNORMAL LOW (ref 150–400)
RBC: 2.7 MIL/uL — ABNORMAL LOW (ref 4.22–5.81)
RDW: 15.3 % (ref 11.5–15.5)
WBC: 10.6 10*3/uL — ABNORMAL HIGH (ref 4.0–10.5)
nRBC: 0 % (ref 0.0–0.2)

## 2019-09-26 LAB — RENAL FUNCTION PANEL
Albumin: 1.9 g/dL — ABNORMAL LOW (ref 3.5–5.0)
Anion gap: 11 (ref 5–15)
BUN: 77 mg/dL — ABNORMAL HIGH (ref 8–23)
CO2: 24 mmol/L (ref 22–32)
Calcium: 7.8 mg/dL — ABNORMAL LOW (ref 8.9–10.3)
Chloride: 112 mmol/L — ABNORMAL HIGH (ref 98–111)
Creatinine, Ser: 4.68 mg/dL — ABNORMAL HIGH (ref 0.61–1.24)
GFR calc Af Amer: 14 mL/min — ABNORMAL LOW (ref >60)
GFR calc non Af Amer: 12 mL/min — ABNORMAL LOW (ref >60)
Glucose, Bld: 144 mg/dL — ABNORMAL HIGH (ref 70–99)
Phosphorus: 4.5 mg/dL (ref 2.5–4.6)
Potassium: 5 mmol/L (ref 3.5–5.1)
Sodium: 147 mmol/L — ABNORMAL HIGH (ref 135–145)

## 2019-09-26 LAB — APTT: aPTT: 59 seconds — ABNORMAL HIGH (ref 24–36)

## 2019-09-26 LAB — GLUCOSE, CAPILLARY
Glucose-Capillary: 140 mg/dL — ABNORMAL HIGH (ref 70–99)
Glucose-Capillary: 138 mg/dL — ABNORMAL HIGH (ref 70–99)
Glucose-Capillary: 171 mg/dL — ABNORMAL HIGH (ref 70–99)
Glucose-Capillary: 205 mg/dL — ABNORMAL HIGH (ref 70–99)
Glucose-Capillary: 252 mg/dL — ABNORMAL HIGH (ref 70–99)

## 2019-09-26 MED ORDER — INSULIN ASPART 100 UNIT/ML ~~LOC~~ SOLN
0.0000 [IU] | Freq: Every day | SUBCUTANEOUS | Status: DC
  Administered 2019-09-26 – 2019-09-28 (×2): 3 [IU] via SUBCUTANEOUS

## 2019-09-26 MED ORDER — INSULIN ASPART 100 UNIT/ML ~~LOC~~ SOLN
0.0000 [IU] | Freq: Three times a day (TID) | SUBCUTANEOUS | Status: DC
  Administered 2019-09-26: 2 [IU] via SUBCUTANEOUS
  Administered 2019-09-26: 3 [IU] via SUBCUTANEOUS
  Administered 2019-09-27: 1 [IU] via SUBCUTANEOUS
  Administered 2019-09-28 – 2019-09-29 (×4): 2 [IU] via SUBCUTANEOUS
  Administered 2019-09-30 (×2): 1 [IU] via SUBCUTANEOUS
  Administered 2019-09-30: 2 [IU] via SUBCUTANEOUS

## 2019-09-26 NOTE — Progress Notes (Signed)
Patient ID: Ryan Villa, male   DOB: 02-26-1956, 64 y.o.   MRN: [id number]  PROGRESS NOTE    Ryan Villa  [id number] DOB: 1956-07-31 DOA: 09/15/2019 PCP: Maryella Shivers, MD   Brief Narrative:  65 year old 31 with history of chronic renal disease stage IV, diabetes mellitus type 2, hyperlipidemia, hypertension, left carotid stenosis, intracerebral hemorrhage, status post right BKA presented to [facility] ER after a fall at home 3 days prior to presentation.  He was found to have slurred speech along with hypoxia requiring nonrebreather and significant ecchymosis over the right shoulder area.  He subsequently became more hypoxic and required intubation.  Creatinine was 10.5.  CK 2411.  Procalcitonin 0.61.  UDS negative.  He responded fairly well to IV fluids and briefly required pressor support.  He was found to have right humeral neck fracture on x-ray per ER paper chart, right upper lobe consolidation per chest x-ray, CT of the head had no acute intracranial process, diffuse paraspinous inflammation. As per PCCM, patient was tested negative for Covid at [facility]. He was admitted under PCCM service, started on broad-spectrum antibiotics. Nephrology and orthopedics were consulted.  He subsequently self extubated at night on 09/17/2019.  Creatinine improved with IV fluids and neurology signed off.  He was transferred to [facility] service on 09/19/2019.  Subjective: No acute issues or events overnight, patient remains more awake and alert, somewhat somnolent this morning but improving daily.  Denies chest pain, shortness of breath, nausea, vomiting, diarrhea, constipation, headache, fevers, chills.  Assessment & Plan:   Acute hypoxic respiratory failure, secondary to pneumonia below, POA Rule out aspiration -Intubated on admission and self extubated on 12/20 overnight. Transferred to [facility] service on 09/19/2019. PCCM signed off. SpO2: 95 % O2 Flow Rate (L/min): 4 L/min FiO2 (%): 40 % -Wean off  oxygen as tolerated.  Incentive spirometry. -Given elevated D-dimer we will continue anticoagulation and rule out PE given ongoing hypoxia, awaiting creatinine/kidney function improve -Diet as per SLP recommendations -ongoing concern about increased aspiration risk; track formally removed on 09/25/2019  Right lower lobe pneumonia, POA Cannot rule out aspiration -Covid test was negative at [facility] -Blood cultures negative so far. -Patient received 7 days of IV Rocephin, last dose on 09/01/2019 and also received 3 days of oral Zithromax. -Remains afebrile -Monitor, currently off antibiotics.  E. coli UTI -Has completed treatment with Rocephin as above  Displaced right humerus surgical neck  Fracture - Orthopedics following.  Orthopedics update on 09/22/2019 and surgical intervention will be postponed till next Tuesday (09/28/19) given thrombocytopenia which continues to improve as below - Tentative plan for reverse right shoulder arthroplasty  Right upper extremity edema -Likely secondary to humerus fracture.   -Venous Doppler was negative for DVT  Thrombocytopenia, improving CBC Latest Ref Rng & Units 09/26/2019 09/25/2019 09/24/2019  WBC 4.0 - 10.5 K/uL 10.6(H) 9.4 9.8  Hemoglobin 13.0 - 17.0 g/dL 8.3(L) 8.3(L) 8.1(L)  Hematocrit 39.0 - 52.0 % 26.8(L) 26.7(L) 25.3(L)  Platelets 150 - 400 K/uL 130(L) 103(L) 72(L)  - Baseline around 150 per chart review - Questionably from consumption/HIT; DC heparin.  - Now on argatroban - heparin Ab positive - Ddimer 3.4; Fibrinogen 381, INR 1.0, Protime 13.0, PTT 49 - Tentatively planning for further imaging once creatinine improves  Hypernatremia, likely hypovolemic in the setting of poor p.o. intake and dehydration - Much improved.  Monitor  AKI on probable CKD stage IV in the setting of dehydration as above - Creatinine was around 4 in 2018 in care  everywhere - Creatinine 9.68 at admission.  Nephrology has signed off.  Monitor.  Strict  input and output. Lab Results  Component Value Date   CREATININE 4.68 (H) 09/26/2019   CREATININE 4.52 (H) 09/25/2019   CREATININE 4.73 (H) 09/24/2019   Anemia of chronic disease - Hemoglobin remained stable around 8 for the past 72h - Status post 1 unit packed red cell transfusion on 09/12/2019.  Monitor  Hypertension  - Stable. Continue Norvasc  Acute metabolic encephalopathy/delirium, resolved - Initial CT head with no acute findings, multiple remote lacunar strokes noted - Monitor mental status. Frequent reorientation. Fall precautions. - Patient alert and oriented x4 on the morning of 09/24/2019, continue to remove sedating medications as tolerated. - Mental status waxing/waning but generally continues to improve daily  History of CVA - LDL low.  Lipitor on hold.  Restart Plavix once able to tolerate orally.  Generalized conditioning -Overall prognosis is guarded to poor.  Will request palliative care evaluation for goals of care discussion  DVT prophylaxis: DC heparin -now on argatroban Code Status: Full Family Communication: Mother updated on phone - asking them to bring his prosthetic to help with PT/OT. Also they're requesting SNF near their home () if at all possible. Disposition Plan: We will remain inpatient for further work-up and awaiting orthopedic intervention - ultimate disposition likely SNF given amputation and RUE fracture will likely limit patient's ambulation and safety Consultants: PCCM/nephrology/orthopedics Procedures: Intubation on presentation and self extubation on 09/17/2019 Right IJ central line placement on 09/15/2019  Antimicrobials:  Anti-infectives (From admission, onward)   Start     Dose/Rate Route Frequency Ordered Stop   09/28/19 0000  ceFAZolin (ANCEF) IVPB 2g/100 mL premix    Note to Pharmacy: Surgery delayed until 1/5.   2 g 200 mL/hr over 30 Minutes Intravenous To Surgery 09/22/19 0904 09/29/19 0000   09/22/19 0815  ceFAZolin  (ANCEF) IVPB 2g/100 mL premix  Status:  Discontinued     2 g 200 mL/hr over 30 Minutes Intravenous To Surgery 09/22/19 0806 09/22/19 0905   09/15/19 1715  azithromycin (ZITHROMAX) 500 mg in sodium chloride 0.9 % 250 mL IVPB  Status:  Discontinued     500 mg 250 mL/hr over 60 Minutes Intravenous Every 24 hours 09/15/19 1711 09/18/19 1107   09/15/19 1715  cefTRIAXone (ROCEPHIN) 1 g in sodium chloride 0.9 % 100 mL IVPB     1 g 200 mL/hr over 30 Minutes Intravenous Every 24 hours 09/15/19 1711 09/21/19 1754      Objective: Vitals:   09/25/19 1741 09/25/19 2041 09/26/19 0534 09/26/19 1005  BP: 102/76 108/66 (!) 125/51 (!) 108/41  Pulse: 72 66 65 66  Resp: '18 18 18 18  ' Temp: 98.7 F (37.1 C) 98.8 F (37.1 C) 98.4 F (36.9 C) 98.3 F (36.8 C)  TempSrc: Oral Oral Oral Oral  SpO2: 94% 94% 98% 95%  Weight:  65.3 kg    Height:        Intake/Output Summary (Last 24 hours) at 09/26/2019 1250 Last data filed at 09/26/2019 0700 Gross per 24 hour  Intake 1100 ml  Output 2000 ml  Net -900 ml   Filed Weights   09/23/19 0500 09/24/19 2042 09/25/19 2041  Weight: 69.7 kg 68.1 kg 65.3 kg   Examination:  General exam: Looks chronically ill and older than stated age.  No distress.  Awake alert oriented x4. Lungs: Diminished breath sounds bilaterally, scant rales bibasilarly without overt rhonchi or wheeze Cardiovascular: S1 & S2 heard, mild  intermittent bradycardia Abdomen: Abdomen is nondistended, soft and nontender. Normal bowel sounds heard. Extremities: No cyanosis, clubbing; trace lower extremity edema present.  Right BKA present. Right upper extremity ecchymosis/edema  Data Reviewed: I have personally reviewed following labs and imaging studies  CBC: Recent Labs  Lab 09/20/19 0530 09/22/19 0443 09/22/19 0942 09/23/19 0543 09/24/19 0057 09/25/19 0505 09/26/19 0730  WBC 10.5 10.9*  --  10.4 9.8 9.4 10.6*  NEUTROABS 8.0*  --   --  8.1*  --   --   --   HGB 8.6* 7.6*  --  8.5* 8.1*  8.3* 8.3*  HCT 27.2* 23.7*  --  26.9* 25.3* 26.7* 26.8*  MCV 96.1 95.6  --  97.8 97.3 98.2 99.3  PLT 123* 58* 57* 64* 72* 103* 372*   Basic Metabolic Panel: Recent Labs  Lab 09/21/19 0334 09/22/19 0442 09/23/19 0543 09/24/19 0057 09/25/19 0505 09/26/19 0730  NA 140 142 143 143 148* 147*  K 3.1* 3.5 4.2 4.5 4.9 5.0  CL 103 106 107 109 111 112*  CO2 '28 26 26 28 26 24  ' GLUCOSE 308* 130* 216* 187* 147* 144*  BUN 84* 86* 91* 92* 88* 77*  CREATININE 5.17* 5.12* 5.26* 4.73* 4.52* 4.68*  CALCIUM 6.8* 6.9* 7.4* 7.3* 7.6* 7.8*  MG 1.4* 2.2 2.1  --   --   --   PHOS 3.9 4.0 5.4* 5.4* 4.6 4.5   GFR: Estimated Creatinine Clearance: 14.9 mL/min (A) (by C-G formula based on SCr of 4.68 mg/dL (H)). Liver Function Tests: Recent Labs  Lab 09/20/19 0530 09/22/19 0442 09/23/19 0543 09/24/19 0057 09/25/19 0505 09/26/19 0730  AST 17  --  20  --   --   --   ALT 17  --  23  --   --   --   ALKPHOS 49  --  56  --   --   --   BILITOT 0.5  --  0.5  --   --   --   PROT 4.4*  --  4.6*  --   --   --   ALBUMIN 1.9*  1.9* 1.7* 1.8* 1.8* 1.9* 1.9*   No results for input(s): LIPASE, AMYLASE in the last 168 hours. No results for input(s): AMMONIA in the last 168 hours. Coagulation Profile: Recent Labs  Lab 09/22/19 0942 09/23/19 1902  INR 1.0 0.9   Cardiac Enzymes: No results for input(s): CKTOTAL, CKMB, CKMBINDEX, TROPONINI in the last 168 hours. CBG: Recent Labs  Lab 09/25/19 1959 09/25/19 2345 09/26/19 0412 09/26/19 0719 09/26/19 1116  GLUCAP 264* 167* 140* 138* 171*    Recent Results (from the past 240 hour(s))  SARS CORONAVIRUS 2 (TAT 6-24 HRS) Nasopharyngeal Nasopharyngeal Swab     Status: None   Collection Time: 09/20/19  5:01 PM   Specimen: Nasopharyngeal Swab  Result Value Ref Range Status   SARS Coronavirus 2 NEGATIVE NEGATIVE Final    Comment: (NOTE) SARS-CoV-2 target nucleic acids are NOT DETECTED. The SARS-CoV-2 RNA is generally detectable in upper and  lower respiratory specimens during the acute phase of infection. Negative results do not preclude SARS-CoV-2 infection, do not rule out co-infections with other pathogens, and should not be used as the sole basis for treatment or other patient management decisions. Negative results must be combined with clinical observations, patient history, and epidemiological information. The expected result is Negative. Fact Sheet for Patients: SugarRoll.be Fact Sheet for Healthcare Providers: https://www.woods-mathews.com/ This test is not yet approved or cleared by the  [place] Architectural technologist and  has been authorized for detection and/or diagnosis of SARS-CoV-2 by FDA under an Print production planner (EUA). This EUA will remain  in effect (meaning this test can be used) for the duration of the COVID-19 declaration under Section 56 4(b)(1) of the Act, 21 U.S.C. section 360bbb-3(b)(1), unless the authorization is terminated or revoked sooner. Performed at [facility], [place] [address] [place], [place] [address]   Surgical pcr screen     Status: Abnormal   Collection Time: 09/21/19  3:02 PM   Specimen: Nasal Mucosa; Nasal Swab  Result Value Ref Range Status   MRSA, PCR POSITIVE (A) NEGATIVE Final   Staphylococcus aureus POSITIVE (A) NEGATIVE Final    Comment: (NOTE) The Xpert SA Assay (FDA approved for NASAL specimens in patients 55 years of age and older), is one component of a comprehensive surveillance program. It is not intended to diagnose infection nor to guide or monitor treatment. Performed at [facility], [place] [address], [place], [place] [address]     Radiology Studies: No results found.  Scheduled Meds: . sodium chloride   Intravenous Once  . amLODipine  10 mg Per Tube Daily  . chlorhexidine  60 mL Topical Once  . chlorhexidine gluconate (MEDLINE KIT)  15 mL Mouth Rinse BID  . Chlorhexidine Gluconate Cloth  6 each Topical  Daily  . darbepoetin (ARANESP) injection - NON-DIALYSIS  100 mcg Subcutaneous Q Thu-1800  . feeding supplement (PRO-STAT SUGAR FREE 64)  30 mL Per Tube TID  . free water  300 mL Per Tube Q6H  . insulin aspart  0-5 Units Subcutaneous QHS  . insulin aspart  0-9 Units Subcutaneous TID WC  . mupirocin ointment   Nasal BID  . pantoprazole (PROTONIX) IV  40 mg Intravenous QHS   Continuous Infusions: . sodium chloride 10 mL/hr at 09/21/19 1100  . argatroban 1.4 mcg/kg/min (09/26/19 0340)  . [START ON 09/28/2019]  ceFAZolin (ANCEF) IV    . feeding supplement (OSMOLITE 1.2 CAL) 0 mL (09/23/19 1104)    Little Ishikawa, DO [facility] 09/26/2019, 12:50 PM

## 2019-09-26 NOTE — Progress Notes (Signed)
Ryan Villa for Argatroban Indication: r/o HIT, r/o PE  Allergies  Allergen Reactions  . Heparin     "Heparin antibody positive; SRA pending"    Patient Measurements: Height: 5\' 8"  (172.7 cm) Weight: 143 lb 15.4 oz (65.3 kg) IBW/kg (Calculated) : 68.4  Vital Signs: Temp: 98.4 F (36.9 C) (01/03 0534) Temp Source: Oral (01/03 0534) BP: 125/51 (01/03 0534) Pulse Rate: 65 (01/03 0534)  Labs: Recent Labs    09/23/19 1902 09/24/19 0057 09/24/19 1019 09/24/19 1345 09/25/19 0505  HGB  --  8.1*  --   --  8.3*  HCT  --  25.3*  --   --  26.7*  PLT  --  72*  --   --  103*  APTT  --  47* 60* 58* 65*  LABPROT 11.8  --   --   --   --   INR 0.9  --   --   --   --   CREATININE  --  4.73*  --   --  4.52*    Estimated Creatinine Clearance: 15.5 mL/min (A) (by C-G formula based on SCr of 4.52 mg/dL (H)).   Medical History: No past medical history on file.  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 Heparin was stopped on 12/30 and HITT testing was sent.   The patient's estimated 4T score is 5 and the heparin antibody ODT was 2.961 making HITT a possibility, SRA is ordered and pending results. Noted RUE edema with dopplers negative for DVT however per discussion with MD, also with concern for possible PE. Will start argatroban for anticoagulation while awaiting further results.   Daily aPTT is therapeutic at 59 (goal 50-90) on argatrobran 1.5mcg/kg/min. No reported bleeding. Hgb stable at 8.3. Plt 130 which is now increasing and up from 57.  Goal of Therapy:  aPTT 50-90 seconds Monitor platelets by anticoagulation protocol: Yes   Plan:  - Continue argatroban at 1.4 mcg/kg/min - Check aPTT daily - Follow up results of SRA which is still pending  - Follow up ability to obtain CTA to evaluate presence of PE  Cristela Felt, PharmD PGY1 Pharmacy Resident [employer]:  [phone]

## 2019-09-27 ENCOUNTER — Inpatient Hospital Stay (HOSPITAL_COMMUNITY): Payer: Medicare Other

## 2019-09-27 LAB — RENAL FUNCTION PANEL
Albumin: 2 g/dL — ABNORMAL LOW (ref 3.5–5.0)
Anion gap: 12 (ref 5–15)
BUN: 79 mg/dL — ABNORMAL HIGH (ref 8–23)
CO2: 22 mmol/L (ref 22–32)
Calcium: 7.8 mg/dL — ABNORMAL LOW (ref 8.9–10.3)
Chloride: 112 mmol/L — ABNORMAL HIGH (ref 98–111)
Creatinine, Ser: 4.9 mg/dL — ABNORMAL HIGH (ref 0.61–1.24)
GFR calc Af Amer: 14 mL/min — ABNORMAL LOW (ref >60)
GFR calc non Af Amer: 12 mL/min — ABNORMAL LOW (ref >60)
Glucose, Bld: 125 mg/dL — ABNORMAL HIGH (ref 70–99)
Phosphorus: 4.7 mg/dL — ABNORMAL HIGH (ref 2.5–4.6)
Potassium: 5.2 mmol/L — ABNORMAL HIGH (ref 3.5–5.1)
Sodium: 146 mmol/L — ABNORMAL HIGH (ref 135–145)

## 2019-09-27 LAB — SEROTONIN RELEASE ASSAY (SRA)
SRA .2 IU/mL UFH Ser-aCnc: 91 % — ABNORMAL HIGH (ref 0–20)
SRA 100IU/mL UFH Ser-aCnc: <1 % (ref 0–20)

## 2019-09-27 LAB — CBC
HCT: 27.6 % — ABNORMAL LOW (ref 39.0–52.0)
Hemoglobin: 8.5 g/dL — ABNORMAL LOW (ref 13.0–17.0)
MCH: 31.1 pg (ref 26.0–34.0)
MCHC: 30.8 g/dL (ref 30.0–36.0)
MCV: 101.1 fL — ABNORMAL HIGH (ref 80.0–100.0)
Platelets: 164 10*3/uL (ref 150–400)
RBC: 2.73 MIL/uL — ABNORMAL LOW (ref 4.22–5.81)
RDW: 15.4 % (ref 11.5–15.5)
WBC: 9.2 10*3/uL (ref 4.0–10.5)
nRBC: 0 % (ref 0.0–0.2)

## 2019-09-27 LAB — GLUCOSE, CAPILLARY
Glucose-Capillary: 142 mg/dL — ABNORMAL HIGH (ref 70–99)
Glucose-Capillary: 120 mg/dL — ABNORMAL HIGH (ref 70–99)
Glucose-Capillary: 112 mg/dL — ABNORMAL HIGH (ref 70–99)
Glucose-Capillary: 100 mg/dL — ABNORMAL HIGH (ref 70–99)

## 2019-09-27 LAB — APTT: aPTT: 56 seconds — ABNORMAL HIGH (ref 24–36)

## 2019-09-27 MED ORDER — TECHNETIUM TO 99M ALBUMIN AGGREGATED
1.6100 | Freq: Once | INTRAVENOUS | Status: AC | PRN
  Administered 2019-09-27: 1.61 via INTRAVENOUS

## 2019-09-27 MED ORDER — VANCOMYCIN HCL IN DEXTROSE 1-5 GM/200ML-% IV SOLN
1000.0000 mg | INTRAVENOUS | Status: DC
  Filled 2019-09-27: qty 200

## 2019-09-27 NOTE — Progress Notes (Signed)
Plan for surgical treatment of right proximal humerus tomorrow.  We will make nothing by mouth tonight at midnight.  Appreciate medical management from the hospitalist service.  Labs appear appropriate at this time to proceed with surgery.

## 2019-09-27 NOTE — Progress Notes (Signed)
SLP Cancellation Note  Patient Details Name: Ryan Villa MRN: [id number] DOB: [birth year]   Cancelled treatment:        Pt currently in IR for VQ scan and unable to see for dysphagia tx. Called by RN reporting that pt is coughing following all swallows (puree and liquid) despite full supervision and use of chin tuck strategy as recommended per MBS 1/2. Per RN he is scheduled for surgery tomorrow for displaced right humerus surgical neck fracture. Advised RN to stop feeding at dinner if he has significant s/s aspiration (NPO after midnight for surgery). SLP will see pt first available time.    Houston Siren 09/27/2019, 2:43 PM  Orbie Pyo Colvin Caroli.Ed Risk analyst [phone] Office [phone]

## 2019-09-27 NOTE — Progress Notes (Signed)
Pharmacy Heparin Induced Thrombocytopenia (HIT) Note:  Ryan Villa is an 64 y.o. male being evaluated for HIT. Heparin was started 12/23 for VTE prophylaxis, and baseline platelets were 190.   HIT labs were ordered on 12/30 when platelets dropped to 57.  Auto-populate labs:  Heparin Induced Plt Ab  Date/Time Value Ref Range Status  09/22/2019 09:42 AM 2.961 (H) 0.000 - 0.400 OD Final    Comment:    (NOTE) Performed At: [facility] [address] [place], [place] [id number] Rush Farmer MD [id number]    [place] .2 IU/mL UFH Ser-aCnc  Date/Time Value Ref Range Status  09/22/2019 09:42 AM 91 (H) 0 - 20 % Final    Comment:    (NOTE) This test was developed and its performance characteristics determined by [facility]. It has not been cleared or approved by the Food and Drug Administration.    SRA 100IU/mL UFH Ser-aCnc  Date/Time Value Ref Range Status  09/22/2019 09:42 AM <1 0 - 20 % Final    Comment:    (NOTE) This test was developed and its performance characteristics determined by [facility]. It has not been cleared or approved by the Food and Drug Administration.      CALCULATE SCORE:  4Ts (see the HIT Algorithm) Score  Thrombocytopenia 2  Timing 2  Thrombosis 0  Other causes of thrombocytopenia 1  Total 5     Recommendations (A or B) are based on available lab results (HIT antibody and/or SRA) and the HIT algorithm    A. HIT antibody result available  Possible HIT    Order SRA:  Yes  Discontinue heparin / LMWH:  Yes  Initiate alternative anticoagulation:  Yes  Document heparin allergy:  Yes  B. 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 Anticoagulation plans:  Begin  alternative anticoagulation with Argatroban   Results discussed with MD who is planning to get a VQ scan to evaluate for PE and will make decision on anticoagulation after the results of that study. Platelets have recovered and are up to 164.   Comments (List any alternative plans or if there are contraindications to therapy)  Thank you for allowing pharmacy to be a part of this patient's care.  Alycia Rossetti, PharmD, BCPS Clinical Pharmacist Clinical phone for 09/27/2019: [phone] 09/27/2019 2:46 PM   **Pharmacist phone directory can now be found on [email or web address] ([email or web address]).  Listed under [place].

## 2019-09-27 NOTE — Progress Notes (Signed)
[place] for Argatroban Indication: r/o HIT, r/o PE  Allergies  Allergen Reactions  . Heparin     "Heparin antibody positive; SRA pending"    Patient Measurements: Height: 5\' 8"  (172.7 cm) Weight: 144 lb 2.9 oz (65.4 kg) IBW/kg (Calculated) : 68.4  Vital Signs: Temp: 97.7 F (36.5 C) (01/04 0608) Temp Source: Oral (01/04 0608) BP: 137/36 (01/04 0608) Pulse Rate: 66 (01/04 0608)  Labs: Recent Labs    09/25/19 0505 09/26/19 0730 09/27/19 0351  HGB 8.3* 8.3* 8.5*  HCT 26.7* 26.8* 27.6*  PLT 103* 130* 164  APTT 65* 59* 56*  CREATININE 4.52* 4.68* 4.90*    Estimated Creatinine Clearance: 14.3 mL/min (A) (by C-G formula based on SCr of 4.9 mg/dL (H)).   Medical History: No past medical history on file.  Assessment: 4 YOM on heparin SQ for VTE prophylaxis with a >50% drop in platelets on day 7 of therapy concerning for HITT. Heparin was stopped on 12/30 and HITT testing was sent.   The patient's estimated 4T score is 5 and the heparin antibody ODT was 2.961 making HITT a possibility, SRA is ordered and pending results. Noted RUE edema with dopplers negative for DVT however per discussion with MD, also with concern for possible PE. Will start argatroban for anticoagulation while awaiting further results.   Daily aPTT is therapeutic at 89 (goal 50-90) on argatrobran 1.7mcg/kg/min. No reported bleeding. Hgb stable at 8.5, plts are improving and up to 164.  Goal of Therapy:  aPTT 50-90 seconds Monitor platelets by anticoagulation protocol: Yes   Plan:  - Continue argatroban at 1.4 mcg/kg/min - Check aPTT daily - Follow up results of SRA which is still pending (as of 1/4) - Follow up ability to obtain CTA to evaluate presence of PE  Thank you for allowing pharmacy to be a part of this patient's care.  Alycia Rossetti, PharmD, BCPS Clinical Pharmacist Clinical phone for 09/27/2019: [phone] 09/27/2019 8:29 AM   **Pharmacist phone  directory can now be found on [email or web address] ([email or web address]).  Listed under [place].

## 2019-09-27 NOTE — Progress Notes (Addendum)
Physical Therapy Treatment Patient Details Name: Ryan Villa MRN: [id number] DOB: April 22, 1956 Today's Date: 09/27/2019    History of Present Illness Pt adm with rt humeral fx after fall at home and in the floor x 3 days. Pt intubated 12/23 and self extubated 12/25. Pt with acute metabolic encephalopathy and delirium, PMH - rt bka, ckd, dm, htn, iintracerebral hemorrhage    PT Comments    Pt was seen for mobility on bed with max assist to get out to EOB, but also to stand and pivot on LLE to chair.  Pt is notably not aware of his deficits, and tends to attend to L side more than R.  Very unaware of the RUE particularly and was given pillow support to focus his attention on R arm.  Follow up on goals of PT acutely and will work toward more safety and independence with transfers.  Follow Up Recommendations  SNF     Equipment Recommendations  None recommended by PT    Recommendations for Other Services       Precautions / Restrictions Precautions Precautions: Fall Precaution Comments: R proximal humerus fracture - plan for OR on 1/5 Required Braces or Orthoses: Sling Restrictions Weight Bearing Restrictions: Yes RUE Weight Bearing: Non weight bearing    Mobility  Bed Mobility Overal bed mobility: Needs Assistance Bed Mobility: Supine to Sit     Supine to sit: Mod assist;Max assist     General bed mobility comments: pt is continually shifting posture on side of bed, max assist to scoot to EOB and mod to control balance  Transfers Overall transfer level: Needs assistance Equipment used: 1 person hand held assist Transfers: Sit to/from [employer] Transfers Sit to Stand: Max assist Stand pivot transfers: Max assist Squat pivot transfers: Max assist     General transfer comment: pt is completely unable to slide on the bed or back in the chair  Ambulation/Gait                 Stairs             Wheelchair Mobility    Modified Rankin (Stroke  Patients Only)       Balance                                            Cognition Arousal/Alertness: Awake/alert Behavior During Therapy: Flat affect Overall Cognitive Status: Impaired/Different from baseline Area of Impairment: Orientation;Attention;Memory;Following commands;Safety/judgement;Awareness;Problem solving                 Orientation Level: Time;Situation Current Attention Level: Selective Memory: Decreased recall of precautions;Decreased short-term memory Following Commands: Follows one step commands inconsistently;Follows one step commands with increased time Safety/Judgement: Decreased awareness of safety;Decreased awareness of deficits Awareness: Intellectual Problem Solving: Slow processing;Difficulty sequencing;Decreased initiation;Requires verbal cues;Requires tactile cues General Comments: mildly agitated then calmer to get into the chair      Exercises      General Comments        Pertinent Vitals/Pain Pain Assessment: Faces Faces Pain Scale: No hurt Pain Location: no complaints or grimacing    Home Living                      Prior Function            PT Goals (current goals can now be found in the care plan section)  Acute Rehab PT Goals Patient Stated Goal: none stated Progress towards PT goals: Progressing toward goals    Frequency    Min 2X/week      PT Plan Current plan remains appropriate    Co-evaluation              AM-PAC PT "6 Clicks" Mobility   Outcome Measure  Help needed turning from your back to your side while in a flat bed without using bedrails?: A Little Help needed moving from lying on your back to sitting on the side of a flat bed without using bedrails?: A Lot Help needed moving to and from a bed to a chair (including a wheelchair)?: Total Help needed standing up from a chair using your arms (e.g., wheelchair or bedside chair)?: Total Help needed to walk in hospital  room?: Total Help needed climbing 3-5 steps with a railing? : Total 6 Click Score: 9    End of Session Equipment Utilized During Treatment: Gait belt;Oxygen Activity Tolerance: Patient tolerated treatment well;Patient limited by fatigue Patient left: with call bell/phone within reach;in chair;with chair alarm set Nurse Communication: Mobility status PT Visit Diagnosis: Other abnormalities of gait and mobility (R26.89);Muscle weakness (generalized) (M62.81);Pain Pain - Right/Left: Right Pain - part of body: Shoulder     Time: [id number] PT Time Calculation (min) (ACUTE ONLY): 29 min  Charges:  $Therapeutic Activity: 23-37 mins                   Ramond Dial 09/27/2019, 7:51 PM   Mee Hives, PT MS Acute Rehab Dept. Number: [place]

## 2019-09-27 NOTE — Progress Notes (Addendum)
Patient ID: Ryan Villa, male   DOB: [birth year], 64 y.o.   MRN: [id number]  PROGRESS NOTE    Lexington Krotz  [id number] DOB: 1955/11/04 DOA: 09/15/2019 PCP: Maryella Shivers, MD   Brief Narrative:  64 year old 58 with history of chronic renal disease stage IV, diabetes mellitus type 2, hyperlipidemia, hypertension, left carotid stenosis, intracerebral hemorrhage, status post right BKA presented to [facility] ER after a fall at home 3 days prior to presentation.  He was found to have slurred speech along with hypoxia requiring nonrebreather and significant ecchymosis over the right shoulder area.  He subsequently became more hypoxic and required intubation.  Creatinine was 10.5.  CK 2411.  Procalcitonin 0.61.  UDS negative.  He responded fairly well to IV fluids and briefly required pressor support.  He was found to have right humeral neck fracture on x-ray per ER paper chart, right upper lobe consolidation per chest x-ray, CT of the head had no acute intracranial process, diffuse paraspinous inflammation. As per PCCM, patient was tested negative for Covid at [facility]. He was admitted under PCCM service, started on broad-spectrum antibiotics. Nephrology and orthopedics were consulted.  He subsequently self extubated at night on 09/17/2019.  Creatinine improved with IV fluids and neurology signed off.  He was transferred to [facility] service on 09/19/2019.  Subjective: No acute issues or events overnight, patient remains more awake and alert daily, remains somewhat somnolent but easily arousable -continues to answer questions somewhat inappropriately but otherwise much more interactive than previously, following simple commands. Denies chest pain, shortness of breath, nausea, vomiting, diarrhea, constipation, headache, fevers, chills.  Assessment & Plan:   Acute hypoxic respiratory failure, secondary to pneumonia below, POA Rule out aspiration, resolving -Intubated on admission and self extubated on  12/20 overnight. Transferred to [facility] service on 09/19/2019. PCCM signed off. -Patient now on room air for over 24 hours, will need ambulatory O2 screen prior to disposition -Given elevated D-dimer we will follow VQ scan, creatinine continues to be elevated unable to perform CTA -Diet as per SLP recommendations - ongoing concern about increased aspiration risk; NG removed on 09/25/2019  Right lower lobe pneumonia, POA Cannot rule out aspiration -Covid test was negative at [facility] -Blood cultures negative so far. -Patient received 7 days of IV Rocephin, last dose on 09/01/2019 and also received 3 days of oral Zithromax. -Remains afebrile -Monitor, currently off antibiotics.  HIT, confirmed -Ab positive; SRA positive -Continues on argatroban per pharmacy -VQ scan pending above (cannot perform CTA due to creatinine; DVT studies so far negative) -Will continue anticoagulation (DOAC) for 4 weeks minimum, depending on VQ findings may need to extend to 3 months per protocol.   E. coli UTI, resolved (POA). -Has completed treatment with Rocephin as above  Displaced right humerus surgical neck  Fracture - Orthopedics following.  Orthopedics update on 09/22/2019 and surgical intervention will be postponed till next Tuesday (09/28/19) given thrombocytopenia which continues to improve as below - Tentative plan for reverse right shoulder arthroplasty  Right upper extremity edema -Likely secondary to humerus fracture.   -Venous Doppler was negative for DVT  Thrombocytopenia, resolved CBC Latest Ref Rng & Units 09/27/2019 09/26/2019 09/25/2019  WBC 4.0 - 10.5 K/uL 9.2 10.6(H) 9.4  Hemoglobin 13.0 - 17.0 g/dL 8.5(L) 8.3(L) 8.3(L)  Hematocrit 39.0 - 52.0 % 27.6(L) 26.8(L) 26.7(L)  Platelets 150 - 400 K/uL 164 130(L) 103(L)  - Baseline around 150 per chart review - Questionably from consumption/HIT; DC heparin. See above for HIT. - Ddimer 3.4; Fibrinogen  381, INR 1.0, Protime 13.0, PTT  49  Hypernatremia, likely hypovolemic in the setting of poor p.o. intake and dehydration - Much improved.  Monitor  AKI on probable CKD stage IV in the setting of dehydration as above - Creatinine was around 4 in 2018 in care everywhere - Creatinine 9.68 at admission.  Nephrology has signed off.  Monitor.  Strict input and output. -Encourage PO intake -K 5.2 - follow am labs - if continues to rise will need to treat  Lab Results  Component Value Date   CREATININE 4.90 (H) 09/27/2019   CREATININE 4.68 (H) 09/26/2019   CREATININE 4.52 (H) 09/25/2019    Anemia of chronic disease - Hemoglobin remained stable around 8 for the past 72h - Status post 1 unit packed red cell transfusion on 09/12/2019.  Monitor  Hypertension  - Stable. Continue Norvasc  Acute metabolic encephalopathy/delirium, resolved - Initial CT head with no acute findings, multiple remote lacunar strokes noted - Monitor mental status. Frequent reorientation. Fall precautions. - Patient alert and oriented x4 on the morning of 09/24/2019, continue to remove sedating medications as tolerated. - Mental status waxing/waning but generally continues to improve daily  History of CVA - LDL low.  Lipitor on hold.  Restart Plavix once able to tolerate orally.  Generalized conditioning -Overall prognosis is guarded to poor.  Will request palliative care evaluation for goals of care discussion  DVT prophylaxis: DC heparin - now on argatroban Code Status: Full Family Communication: Mother updated on phone - asking them to bring his prosthetic to help with PT/OT. Also they're requesting SNF near their home ([place]) if at all possible. Disposition Plan: We will remain inpatient for further work-up and awaiting orthopedic intervention - ultimate disposition likely SNF given amputation and RUE fracture will likely limit patient's ambulation and safety Consultants: PCCM/nephrology/orthopedics Procedures: Intubation on presentation  and self extubation on 09/17/2019 Right IJ central line placement on 09/15/2019  Antimicrobials:  Anti-infectives (From admission, onward)   Start     Dose/Rate Route Frequency Ordered Stop   09/28/19 0600  vancomycin (VANCOCIN) IVPB 1000 mg/200 mL premix     1,000 mg 200 mL/hr over 60 Minutes Intravenous On call to O.R. 09/27/19 1159 09/29/19 0559   09/28/19 0000  ceFAZolin (ANCEF) IVPB 2g/100 mL premix    Note to Pharmacy: Surgery delayed until 1/5.   2 g 200 mL/hr over 30 Minutes Intravenous To Surgery 09/22/19 0904 09/29/19 0000   09/22/19 0815  ceFAZolin (ANCEF) IVPB 2g/100 mL premix  Status:  Discontinued     2 g 200 mL/hr over 30 Minutes Intravenous To Surgery 09/22/19 0806 09/22/19 0905   09/15/19 1715  azithromycin (ZITHROMAX) 500 mg in sodium chloride 0.9 % 250 mL IVPB  Status:  Discontinued     500 mg 250 mL/hr over 60 Minutes Intravenous Every 24 hours 09/15/19 1711 09/18/19 1107   09/15/19 1715  cefTRIAXone (ROCEPHIN) 1 g in sodium chloride 0.9 % 100 mL IVPB     1 g 200 mL/hr over 30 Minutes Intravenous Every 24 hours 09/15/19 1711 09/21/19 1754      Objective: Vitals:   09/26/19 1644 09/26/19 2121 09/27/19 0608 09/27/19 0849  BP: (!) 122/55 (!) 122/49 (!) 137/36 133/82  Pulse: 64 68 66 70  Resp: '18 18 16 ' (!) 24  Temp: 99 F (37.2 C) 99.1 F (37.3 C) 97.7 F (36.5 C) 98.7 F (37.1 C)  TempSrc: Oral Oral Oral Oral  SpO2: 93% 95% 94% 94%  Weight:  65.4  kg    Height:        Intake/Output Summary (Last 24 hours) at 09/27/2019 1229 Last data filed at 09/27/2019 0754 Gross per 24 hour  Intake 940 ml  Output 2000 ml  Net -1060 ml   Filed Weights   09/24/19 2042 09/25/19 2041 09/26/19 2121  Weight: 68.1 kg 65.3 kg 65.4 kg   Examination:  General exam: Looks chronically ill and older than stated age.  No distress.  Awake alert oriented to person place and situation. Lungs: Diminished breath sounds bilaterally, without overt rhonchi or rales Cardiovascular: S1  & S2 heard, mild intermittent bradycardia Abdomen: Abdomen is nondistended, soft and nontender. Normal bowel sounds heard. Extremities: No cyanosis, clubbing; trace lower extremity edema present.  Right BKA present. Right upper extremity ecchymosis/2+ edema  Data Reviewed: I have personally reviewed following labs and imaging studies  CBC: Recent Labs  Lab 09/23/19 0543 09/24/19 0057 09/25/19 0505 09/26/19 0730 09/27/19 0351  WBC 10.4 9.8 9.4 10.6* 9.2  NEUTROABS 8.1*  --   --   --   --   HGB 8.5* 8.1* 8.3* 8.3* 8.5*  HCT 26.9* 25.3* 26.7* 26.8* 27.6*  MCV 97.8 97.3 98.2 99.3 101.1*  PLT 64* 72* 103* 130* 630   Basic Metabolic Panel: Recent Labs  Lab 09/21/19 0334 09/22/19 0442 09/23/19 0543 09/24/19 0057 09/25/19 0505 09/26/19 0730 09/27/19 0351  NA 140 142 143 143 148* 147* 146*  K 3.1* 3.5 4.2 4.5 4.9 5.0 5.2*  CL 103 106 107 109 111 112* 112*  CO2 '28 26 26 28 26 24 22  ' GLUCOSE 308* 130* 216* 187* 147* 144* 125*  BUN 84* 86* 91* 92* 88* 77* 79*  CREATININE 5.17* 5.12* 5.26* 4.73* 4.52* 4.68* 4.90*  CALCIUM 6.8* 6.9* 7.4* 7.3* 7.6* 7.8* 7.8*  MG 1.4* 2.2 2.1  --   --   --   --   PHOS 3.9 4.0 5.4* 5.4* 4.6 4.5 4.7*   GFR: Estimated Creatinine Clearance: 14.3 mL/min (A) (by C-G formula based on SCr of 4.9 mg/dL (H)). Liver Function Tests: Recent Labs  Lab 09/23/19 0543 09/24/19 0057 09/25/19 0505 09/26/19 0730 09/27/19 0351  AST 20  --   --   --   --   ALT 23  --   --   --   --   ALKPHOS 56  --   --   --   --   BILITOT 0.5  --   --   --   --   PROT 4.6*  --   --   --   --   ALBUMIN 1.8* 1.8* 1.9* 1.9* 2.0*   No results for input(s): LIPASE, AMYLASE in the last 168 hours. No results for input(s): AMMONIA in the last 168 hours. Coagulation Profile: Recent Labs  Lab 09/22/19 0942 09/23/19 1902  INR 1.0 0.9   Cardiac Enzymes: No results for input(s): CKTOTAL, CKMB, CKMBINDEX, TROPONINI in the last 168 hours. CBG: Recent Labs  Lab 09/26/19 1116  09/26/19 1641 09/26/19 2121 09/27/19 0651 09/27/19 1124  GLUCAP 171* 205* 252* 142* 120*    Recent Results (from the past 240 hour(s))  SARS CORONAVIRUS 2 (TAT 6-24 HRS) Nasopharyngeal Nasopharyngeal Swab     Status: None   Collection Time: 09/20/19  5:01 PM   Specimen: Nasopharyngeal Swab  Result Value Ref Range Status   SARS Coronavirus 2 NEGATIVE NEGATIVE Final    Comment: (NOTE) SARS-CoV-2 target nucleic acids are NOT DETECTED. The SARS-CoV-2 RNA is generally  detectable in upper and lower respiratory specimens during the acute phase of infection. Negative results do not preclude SARS-CoV-2 infection, do not rule out co-infections with other pathogens, and should not be used as the sole basis for treatment or other patient management decisions. Negative results must be combined with clinical observations, patient history, and epidemiological information. The expected result is Negative. Fact Sheet for Patients: SugarRoll.be Fact Sheet for Healthcare Providers: https://www.woods-mathews.com/ This test is not yet approved or cleared by the [place] FDA and  has been authorized for detection and/or diagnosis of SARS-CoV-2 by FDA under an Emergency Use Authorization (EUA). This EUA will remain  in effect (meaning this test can be used) for the duration of the COVID-19 declaration under Section 56 4(b)(1) of the Act, 21 U.S.C. section 360bbb-3(b)(1), unless the authorization is terminated or revoked sooner. Performed at [facility], [place] [address] [place], [place] [address]   Surgical pcr screen     Status: Abnormal   Collection Time: 09/21/19  3:02 PM   Specimen: Nasal Mucosa; Nasal Swab  Result Value Ref Range Status   MRSA, PCR POSITIVE (A) NEGATIVE Final   Staphylococcus aureus POSITIVE (A) NEGATIVE Final    Comment: (NOTE) The Xpert SA Assay (FDA approved for NASAL specimens in patients 48 years of age and older),  is one component of a comprehensive surveillance program. It is not intended to diagnose infection nor to guide or monitor treatment. Performed at [facility], [place] [address] [place], [place] [address]     Radiology Studies: No results found.  Scheduled Meds: . sodium chloride   Intravenous Once  . amLODipine  10 mg Per Tube Daily  . chlorhexidine  60 mL Topical Once  . chlorhexidine gluconate (MEDLINE KIT)  15 mL Mouth Rinse BID  . Chlorhexidine Gluconate Cloth  6 each Topical Daily  . darbepoetin (ARANESP) injection - NON-DIALYSIS  100 mcg Subcutaneous Q Thu-1800  . feeding supplement (PRO-STAT SUGAR FREE 64)  30 mL Per Tube TID  . free water  300 mL Per Tube Q6H  . insulin aspart  0-5 Units Subcutaneous QHS  . insulin aspart  0-9 Units Subcutaneous TID WC  . mupirocin ointment   Nasal BID  . pantoprazole (PROTONIX) IV  40 mg Intravenous QHS   Continuous Infusions: . sodium chloride 10 mL/hr at 09/21/19 1100  . argatroban 1.4 mcg/kg/min (09/27/19 1202)  . [START ON 09/28/2019]  ceFAZolin (ANCEF) IV    . feeding supplement (OSMOLITE 1.2 CAL) 0 mL (09/23/19 1104)  . [START ON 09/28/2019] vancomycin      Little Ishikawa, DO [facility] 09/27/2019, 12:29 PM

## 2019-09-28 ENCOUNTER — Inpatient Hospital Stay (HOSPITAL_COMMUNITY): Payer: Medicare Other | Admitting: Anesthesiology

## 2019-09-28 ENCOUNTER — Encounter (HOSPITAL_COMMUNITY)
Admission: AD | Disposition: A | Discharge status: Skilled Nursing Facility | Payer: Self-pay | Source: Other Acute Inpatient Hospital | Attending: Internal Medicine

## 2019-09-28 ENCOUNTER — Inpatient Hospital Stay (HOSPITAL_COMMUNITY): Payer: Medicare Other

## 2019-09-28 ENCOUNTER — Encounter (HOSPITAL_COMMUNITY): Payer: Self-pay | Admitting: Pulmonary Disease

## 2019-09-28 HISTORY — PX: REVERSE SHOULDER ARTHROPLASTY: SHX5054

## 2019-09-28 LAB — RENAL FUNCTION PANEL
Albumin: 2 g/dL — ABNORMAL LOW (ref 3.5–5.0)
Anion gap: 11 (ref 5–15)
BUN: 76 mg/dL — ABNORMAL HIGH (ref 8–23)
CO2: 20 mmol/L — ABNORMAL LOW (ref 22–32)
Calcium: 7.9 mg/dL — ABNORMAL LOW (ref 8.9–10.3)
Chloride: 118 mmol/L — ABNORMAL HIGH (ref 98–111)
Creatinine, Ser: 4.84 mg/dL — ABNORMAL HIGH (ref 0.61–1.24)
GFR calc Af Amer: 14 mL/min — ABNORMAL LOW (ref >60)
GFR calc non Af Amer: 12 mL/min — ABNORMAL LOW (ref >60)
Glucose, Bld: 111 mg/dL — ABNORMAL HIGH (ref 70–99)
Phosphorus: 4.5 mg/dL (ref 2.5–4.6)
Potassium: 5.3 mmol/L — ABNORMAL HIGH (ref 3.5–5.1)
Sodium: 149 mmol/L — ABNORMAL HIGH (ref 135–145)

## 2019-09-28 LAB — CBC
HCT: 27.1 % — ABNORMAL LOW (ref 39.0–52.0)
Hemoglobin: 8.2 g/dL — ABNORMAL LOW (ref 13.0–17.0)
MCH: 30.6 pg (ref 26.0–34.0)
MCHC: 30.3 g/dL (ref 30.0–36.0)
MCV: 101.1 fL — ABNORMAL HIGH (ref 80.0–100.0)
Platelets: 174 10*3/uL (ref 150–400)
RBC: 2.68 MIL/uL — ABNORMAL LOW (ref 4.22–5.81)
RDW: 15.4 % (ref 11.5–15.5)
WBC: 6.4 10*3/uL (ref 4.0–10.5)
nRBC: 0 % (ref 0.0–0.2)

## 2019-09-28 LAB — GLUCOSE, CAPILLARY
Glucose-Capillary: 99 mg/dL (ref 70–99)
Glucose-Capillary: 99 mg/dL (ref 70–99)
Glucose-Capillary: 152 mg/dL — ABNORMAL HIGH (ref 70–99)
Glucose-Capillary: 271 mg/dL — ABNORMAL HIGH (ref 70–99)

## 2019-09-28 LAB — APTT: aPTT: 55 seconds — ABNORMAL HIGH (ref 24–36)

## 2019-09-28 LAB — SURGICAL PCR SCREEN
MRSA, PCR: POSITIVE — AB
Staphylococcus aureus: POSITIVE — AB

## 2019-09-28 SURGERY — ARTHROPLASTY, SHOULDER, TOTAL, REVERSE
Anesthesia: General | Site: Shoulder | Laterality: Right

## 2019-09-28 MED ORDER — LIDOCAINE 2% (20 MG/ML) 5 ML SYRINGE
INTRAMUSCULAR | Status: DC | PRN
  Administered 2019-09-28: 60 mg via INTRAVENOUS

## 2019-09-28 MED ORDER — VANCOMYCIN HCL 1000 MG IV SOLR
INTRAVENOUS | Status: DC | PRN
  Administered 2019-09-28: 1000 mg via INTRAVENOUS

## 2019-09-28 MED ORDER — FENTANYL CITRATE (PF) 250 MCG/5ML IJ SOLN
INTRAMUSCULAR | Status: AC
  Filled 2019-09-28: qty 5

## 2019-09-28 MED ORDER — EPINEPHRINE PF 1 MG/ML IJ SOLN
INTRAMUSCULAR | Status: AC
  Filled 2019-09-28: qty 1

## 2019-09-28 MED ORDER — METOCLOPRAMIDE HCL 5 MG PO TABS: 5.0000 mg | ORAL_TABLET | Freq: Three times a day (TID) | ORAL | Status: DC | PRN

## 2019-09-28 MED ORDER — ALBUMIN HUMAN 5 % IV SOLN: INTRAVENOUS | Status: DC | PRN

## 2019-09-28 MED ORDER — DEXAMETHASONE SODIUM PHOSPHATE 4 MG/ML IJ SOLN
INTRAMUSCULAR | Status: DC | PRN
  Administered 2019-09-28: 4 mg via INTRAVENOUS

## 2019-09-28 MED ORDER — MENTHOL 3 MG MT LOZG
1.0000 | LOZENGE | OROMUCOSAL | Status: DC | PRN
  Filled 2019-09-28: qty 9

## 2019-09-28 MED ORDER — SUCCINYLCHOLINE CHLORIDE 20 MG/ML IJ SOLN
INTRAMUSCULAR | Status: DC | PRN
  Administered 2019-09-28: 60 mg via INTRAVENOUS

## 2019-09-28 MED ORDER — ONDANSETRON HCL 4 MG/2ML IJ SOLN
INTRAMUSCULAR | Status: AC
  Filled 2019-09-28: qty 2

## 2019-09-28 MED ORDER — EPINEPHRINE PF 1 MG/ML IJ SOLN
INTRAMUSCULAR | Status: DC | PRN
  Administered 2019-09-28: .15 mL

## 2019-09-28 MED ORDER — PROPOFOL 10 MG/ML IV BOLUS
INTRAVENOUS | Status: DC | PRN
  Administered 2019-09-28: 80 mg via INTRAVENOUS

## 2019-09-28 MED ORDER — FENTANYL CITRATE (PF) 100 MCG/2ML IJ SOLN
INTRAMUSCULAR | Status: DC | PRN
  Administered 2019-09-28: 50 ug via INTRAVENOUS

## 2019-09-28 MED ORDER — ONDANSETRON HCL 4 MG/2ML IJ SOLN
4.0000 mg | Freq: Four times a day (QID) | INTRAMUSCULAR | Status: DC | PRN
  Administered 2019-10-01: 4 mg via INTRAVENOUS
  Filled 2019-09-28: qty 2

## 2019-09-28 MED ORDER — 0.9 % SODIUM CHLORIDE (POUR BTL) OPTIME
TOPICAL | Status: DC | PRN
  Administered 2019-09-28: 1000 mL

## 2019-09-28 MED ORDER — PHENYLEPHRINE 40 MCG/ML (10ML) SYRINGE FOR IV PUSH (FOR BLOOD PRESSURE SUPPORT)
PREFILLED_SYRINGE | INTRAVENOUS | Status: AC
  Filled 2019-09-28: qty 10

## 2019-09-28 MED ORDER — MIDAZOLAM HCL 2 MG/2ML IJ SOLN
INTRAMUSCULAR | Status: AC
  Filled 2019-09-28: qty 2

## 2019-09-28 MED ORDER — METOCLOPRAMIDE HCL 5 MG/ML IJ SOLN: 5.0000 mg | Freq: Three times a day (TID) | INTRAMUSCULAR | Status: DC | PRN

## 2019-09-28 MED ORDER — PHENYLEPHRINE 40 MCG/ML (10ML) SYRINGE FOR IV PUSH (FOR BLOOD PRESSURE SUPPORT)
PREFILLED_SYRINGE | INTRAVENOUS | Status: DC | PRN
  Administered 2019-09-28 (×2): 80 ug via INTRAVENOUS

## 2019-09-28 MED ORDER — SUCCINYLCHOLINE CHLORIDE 200 MG/10ML IV SOSY
PREFILLED_SYRINGE | INTRAVENOUS | Status: AC
  Filled 2019-09-28: qty 10

## 2019-09-28 MED ORDER — DEXAMETHASONE SODIUM PHOSPHATE 10 MG/ML IJ SOLN
INTRAMUSCULAR | Status: AC
  Filled 2019-09-28: qty 1

## 2019-09-28 MED ORDER — PHENOL 1.4 % MT LIQD: 1.0000 | OROMUCOSAL | Status: DC | PRN

## 2019-09-28 MED ORDER — FENTANYL CITRATE (PF) 100 MCG/2ML IJ SOLN
INTRAMUSCULAR | Status: AC
  Filled 2019-09-28: qty 2

## 2019-09-28 MED ORDER — BUPIVACAINE HCL (PF) 0.25 % IJ SOLN
INTRAMUSCULAR | Status: AC
  Filled 2019-09-28: qty 30

## 2019-09-28 MED ORDER — DOCUSATE SODIUM 100 MG PO CAPS
100.0000 mg | ORAL_CAPSULE | Freq: Two times a day (BID) | ORAL | Status: DC
  Administered 2019-09-28 – 2019-09-30 (×5): 100 mg via ORAL
  Filled 2019-09-28 (×5): qty 1

## 2019-09-28 MED ORDER — LIDOCAINE 2% (20 MG/ML) 5 ML SYRINGE
INTRAMUSCULAR | Status: AC
  Filled 2019-09-28: qty 5

## 2019-09-28 MED ORDER — KETAMINE HCL 50 MG/5ML IJ SOSY
PREFILLED_SYRINGE | INTRAMUSCULAR | Status: AC
  Filled 2019-09-28: qty 5

## 2019-09-28 MED ORDER — SODIUM CHLORIDE 0.9 % IV SOLN: INTRAVENOUS | Status: DC

## 2019-09-28 MED ORDER — TRANEXAMIC ACID 1000 MG/10ML IV SOLN
2000.0000 mg | Freq: Once | INTRAVENOUS | Status: DC
  Filled 2019-09-28: qty 20

## 2019-09-28 MED ORDER — ROCURONIUM BROMIDE 10 MG/ML (PF) SYRINGE
PREFILLED_SYRINGE | INTRAVENOUS | Status: AC
  Filled 2019-09-28: qty 10

## 2019-09-28 MED ORDER — PHENYLEPHRINE HCL-NACL 10-0.9 MG/250ML-% IV SOLN
INTRAVENOUS | Status: DC | PRN
  Administered 2019-09-28: 50 ug/min via INTRAVENOUS

## 2019-09-28 MED ORDER — VANCOMYCIN HCL 1 G IV SOLR
INTRAVENOUS | Status: DC | PRN
  Administered 2019-09-28: 1000 mg via TOPICAL

## 2019-09-28 MED ORDER — ONDANSETRON HCL 4 MG/2ML IJ SOLN
INTRAMUSCULAR | Status: DC | PRN
  Administered 2019-09-28: 4 mg via INTRAVENOUS

## 2019-09-28 MED ORDER — ONDANSETRON HCL 4 MG PO TABS: 4.0000 mg | ORAL_TABLET | Freq: Four times a day (QID) | ORAL | Status: DC | PRN

## 2019-09-28 MED ORDER — KETAMINE HCL 50 MG/ML IJ SOLN
INTRAMUSCULAR | Status: DC | PRN
  Administered 2019-09-28 (×3): 10 mg via INTRAMUSCULAR

## 2019-09-28 MED ORDER — CEFAZOLIN SODIUM-DEXTROSE 1-4 GM/50ML-% IV SOLN
1.0000 g | Freq: Two times a day (BID) | INTRAVENOUS | Status: AC
  Administered 2019-09-28 – 2019-09-29 (×2): 1 g via INTRAVENOUS
  Filled 2019-09-28 (×2): qty 50

## 2019-09-28 MED ORDER — SODIUM CHLORIDE 0.9 % IV SOLN
INTRAVENOUS | Status: DC | PRN
  Administered 2019-09-28: 120 ug via INTRAVENOUS
  Administered 2019-09-28: 50 ug via INTRAVENOUS
  Administered 2019-09-28: 80 ug via INTRAVENOUS
  Administered 2019-09-28: 100 ug via INTRAVENOUS

## 2019-09-28 MED ORDER — BUPIVACAINE HCL (PF) 0.25 % IJ SOLN
INTRAMUSCULAR | Status: DC | PRN
  Administered 2019-09-28: 30 mL

## 2019-09-28 MED ORDER — ACETAMINOPHEN 10 MG/ML IV SOLN
INTRAVENOUS | Status: AC
  Filled 2019-09-28: qty 100

## 2019-09-28 MED ORDER — ARGATROBAN 50 MG/50ML IV SOLN
1.4500 ug/kg/min | INTRAVENOUS | Status: DC
  Administered 2019-09-29: 1.6 ug/kg/min via INTRAVENOUS
  Administered 2019-09-29: 1.5 ug/kg/min via INTRAVENOUS
  Administered 2019-09-30 – 2019-10-02 (×7): 1.6 ug/kg/min via INTRAVENOUS
  Administered 2019-10-02 – 2019-10-09 (×22): 1.45 ug/kg/min via INTRAVENOUS
  Filled 2019-09-28 (×35): qty 50

## 2019-09-28 MED ORDER — STERILE WATER FOR IRRIGATION IR SOLN
Status: DC | PRN
  Administered 2019-09-28: 1000 mL

## 2019-09-28 MED ORDER — VANCOMYCIN HCL 1000 MG IV SOLR
INTRAVENOUS | Status: AC
  Filled 2019-09-28: qty 1000

## 2019-09-28 MED ORDER — ACETAMINOPHEN 10 MG/ML IV SOLN
INTRAVENOUS | Status: DC | PRN
  Administered 2019-09-28: 1000 mg via INTRAVENOUS

## 2019-09-28 MED ORDER — TRANEXAMIC ACID 1000 MG/10ML IV SOLN
INTRAVENOUS | Status: DC | PRN
  Administered 2019-09-28: 2000 mg via TOPICAL

## 2019-09-28 SURGICAL SUPPLY — 76 items
BIT DRILL 5/64X5 DISP (BIT) ×3 IMPLANT
BIT DRILL FLUTED 3.0 STRL (BIT) IMPLANT
BLADE SAG 18X100X1.27 (BLADE) ×3 IMPLANT
CLOSURE WOUND 1/2 X4 (GAUZE/BANDAGES/DRESSINGS) ×1
COVER SURGICAL LIGHT HANDLE (MISCELLANEOUS) ×3 IMPLANT
COVER WAND RF STERILE (DRAPES) ×3 IMPLANT
CUP SUT UNIV REVERS 39 NEU (Shoulder) ×2 IMPLANT
DRAPE IMP U-DRAPE 54X76 (DRAPES) ×6 IMPLANT
DRAPE INCISE IOBAN 66X45 STRL (DRAPES) ×3 IMPLANT
DRAPE ORTHO SPLIT 77X108 STRL (DRAPES) ×4
DRAPE SURG 17X23 STRL (DRAPES) ×3 IMPLANT
DRAPE SURG ORHT 6 SPLT 77X108 (DRAPES) ×2 IMPLANT
DRAPE U-SHAPE 47X51 STRL (DRAPES) ×3 IMPLANT
DRSG ADAPTIC 3X8 NADH LF (GAUZE/BANDAGES/DRESSINGS) ×2 IMPLANT
DRSG AQUACEL AG ADV 3.5X10 (GAUZE/BANDAGES/DRESSINGS) ×3 IMPLANT
DURAPREP 26ML APPLICATOR (WOUND CARE) ×3 IMPLANT
ELECT BLADE 4.0 EZ CLEAN MEGAD (MISCELLANEOUS) ×3
ELECT REM PT RETURN 9FT ADLT (ELECTROSURGICAL) ×3
ELECTRODE BLDE 4.0 EZ CLN MEGD (MISCELLANEOUS) ×1 IMPLANT
ELECTRODE REM PT RTRN 9FT ADLT (ELECTROSURGICAL) ×1 IMPLANT
GAUZE SPONGE 4X4 12PLY STRL (GAUZE/BANDAGES/DRESSINGS) ×2 IMPLANT
GLENOID UNI REV MOD 24 +2 LAT (Joint) ×2 IMPLANT
GLENOSPHERE 39+4 LAT/24 UNI RV (Joint) ×2 IMPLANT
GLOVE BIO SURGEON STRL SZ7.5 (GLOVE) ×3 IMPLANT
GLOVE BIOGEL PI IND STRL 8 (GLOVE) ×1 IMPLANT
GLOVE BIOGEL PI INDICATOR 8 (GLOVE) ×2
GOWN STRL REUS W/ TWL LRG LVL3 (GOWN DISPOSABLE) ×1 IMPLANT
GOWN STRL REUS W/ TWL XL LVL3 (GOWN DISPOSABLE) ×1 IMPLANT
GOWN STRL REUS W/TWL LRG LVL3 (GOWN DISPOSABLE) ×2
GOWN STRL REUS W/TWL XL LVL3 (GOWN DISPOSABLE) ×2
INSERT HUMERAL MED 39/ +3 (Shoulder) IMPLANT
INSERT MEDIUM HUMERAL 39/ +3 (Shoulder) ×2 IMPLANT
KIT BASIN OR (CUSTOM PROCEDURE TRAY) ×3 IMPLANT
KIT TURNOVER KIT B (KITS) ×3 IMPLANT
MANIFOLD NEPTUNE II (INSTRUMENTS) ×3 IMPLANT
NDL 1/2 CIR MAYO (NEEDLE) ×1 IMPLANT
NDL HYPO 25GX1X1/2 BEV (NEEDLE) ×1 IMPLANT
NEEDLE 1/2 CIR MAYO (NEEDLE) ×3 IMPLANT
NEEDLE HYPO 25GX1X1/2 BEV (NEEDLE) ×3 IMPLANT
NS IRRIG 1000ML POUR BTL (IV SOLUTION) ×3 IMPLANT
PACK SHOULDER (CUSTOM PROCEDURE TRAY) ×3 IMPLANT
PAD ABD 8X10 STRL (GAUZE/BANDAGES/DRESSINGS) ×2 IMPLANT
PAD ARMBOARD 7.5X6 YLW CONV (MISCELLANEOUS) ×6 IMPLANT
PIN NITINOL TARGETER 2.8 (PIN) ×2 IMPLANT
PIN SET MODULAR GLENOID SYSTEM (PIN) ×2 IMPLANT
RESTRAINT HEAD UNIVERSAL NS (MISCELLANEOUS) ×3 IMPLANT
SCREW CENTRAL MOD 30MM (Screw) ×2 IMPLANT
SCREW PERI LOCK 5.5X16 (Screw) ×2 IMPLANT
SCREW PERI LOCK 5.5X32 (Screw) ×2 IMPLANT
SCREW PERI LOCK 5.5X36 (Screw) ×2 IMPLANT
SCREW PERIPHERAL 5.5X20 LOCK (Screw) ×2 IMPLANT
SLING ARM FOAM STRAP LRG (SOFTGOODS) ×2 IMPLANT
SLING ARM IMMOBILIZER LRG (SOFTGOODS) IMPLANT
SLING ARM IMMOBILIZER MED (SOFTGOODS) IMPLANT
SPONGE LAP 18X18 RF (DISPOSABLE) IMPLANT
SPONGE LAP 4X18 RFD (DISPOSABLE) ×3 IMPLANT
STAPLER VISISTAT 35W (STAPLE) ×2 IMPLANT
STEM HUMERAL UNI REVERS SZ6 (Stem) ×2 IMPLANT
STRIP CLOSURE SKIN 1/2X4 (GAUZE/BANDAGES/DRESSINGS) ×2 IMPLANT
SUCTION FRAZIER HANDLE 10FR (MISCELLANEOUS) ×2
SUCTION TUBE FRAZIER 10FR DISP (MISCELLANEOUS) ×1 IMPLANT
SUT FIBERWIRE #2 38 T-5 BLUE (SUTURE) ×3
SUT MNCRL AB 4-0 PS2 18 (SUTURE) ×3 IMPLANT
SUT SILK 2 0 REEL (SUTURE) ×2 IMPLANT
SUT VIC AB 1 CT1 27 (SUTURE)
SUT VIC AB 1 CT1 27XBRD ANBCTR (SUTURE) IMPLANT
SUT VIC AB 2-0 CT1 27 (SUTURE) ×2
SUT VIC AB 2-0 CT1 TAPERPNT 27 (SUTURE) ×1 IMPLANT
SUTURE FIBERWR #2 38 T-5 BLUE (SUTURE) ×1 IMPLANT
SYR CONTROL 10ML LL (SYRINGE) ×3 IMPLANT
TAPE CLOTH SURG 6X10 WHT LF (GAUZE/BANDAGES/DRESSINGS) ×2 IMPLANT
TOWEL GREEN STERILE (TOWEL DISPOSABLE) ×3 IMPLANT
TOWEL GREEN STERILE FF (TOWEL DISPOSABLE) ×3 IMPLANT
TOWER CARTRIDGE SMART MIX (DISPOSABLE) IMPLANT
WATER STERILE IRR 1000ML POUR (IV SOLUTION) ×3 IMPLANT
YANKAUER SUCT BULB TIP NO VENT (SUCTIONS) ×3 IMPLANT

## 2019-09-28 NOTE — Anesthesia Procedure Notes (Addendum)
Procedure Name: Intubation Date/Time: 09/28/2019 12:46 PM Performed by: Jenne Campus, CRNA Pre-anesthesia Checklist: Patient identified, Emergency Drugs available, Suction available and Patient being monitored Patient Re-evaluated:Patient Re-evaluated prior to induction Oxygen Delivery Method: Circle system utilized Preoxygenation: Pre-oxygenation with 100% oxygen Induction Type: IV induction Ventilation: Mask ventilation without difficulty Laryngoscope Size: Glidescope and 4 Grade View: Grade I Tube type: Oral Tube size: 7.5 mm Number of attempts: 1 Airway Equipment and Method: Stylet Placement Confirmation: ETT inserted through vocal cords under direct vision,  positive ETCO2,  CO2 detector and breath sounds checked- equal and bilateral Secured at: 23 cm Tube secured with: Tape Dental Injury: Teeth and Oropharynx as per pre-operative assessment  Comments: Elective video-glide. Patient's records indicate that he has a neck fracture. Head/neck remained in neutral position during induction and intubation.

## 2019-09-28 NOTE — Transfer of Care (Signed)
Immediate Anesthesia Transfer of Care Note  Patient: Delrico Shenkman  Procedure(s) Performed: RIGHT REVERSE SHOULDER ARTHROPLASTY (Right Shoulder)  Patient Location: PACU  Anesthesia Type:General  Level of Consciousness: oriented, sedated and patient cooperative  Airway & Oxygen Therapy: Patient Spontanous Breathing and Patient connected to face mask oxygen  Post-op Assessment: Report given to RN and Post -op Vital signs reviewed and stable  Post vital signs: Reviewed  Last Vitals:  Vitals Value Taken Time  BP 127/52 09/28/19 1502  Temp    Pulse 59 09/28/19 1504  Resp 17 09/28/19 1504  SpO2 100 % 09/28/19 1504  Vitals shown include unvalidated device data.  Last Pain:  Vitals:   09/28/19 0928  TempSrc: Oral  PainSc:          Complications: No apparent anesthesia complications

## 2019-09-28 NOTE — Progress Notes (Signed)
SLP Cancellation Note  Patient Details Name: Ryan Villa MRN: [id number] DOB: 1956-08-27   Cancelled treatment:       Reason Eval/Treat Not Completed: Per chart review pt is NPO for planned surgery later today.  SLP will f/u as appropriate.    [name] 09/28/2019, 8:45 AM

## 2019-09-28 NOTE — H&P (Signed)
H&P update  At this time we are moving forward with her surgical management of the right proximal humerus.  This will help facilitate discharge to his skilled nursing facility so that he can bear weight through the arm.  He has been diagnosed with HIT and has been on continuous anticoagulant to protect against clotting, we have returned the soft 3 hours prior to today's procedure.  Otherwise his thrombocytopenia has been treated nicely.  He is in a better medical state today than last week.  The condition still exists that makes this procedure necessary. The treatment plan remains the same, without new options for care.  No new pharmacological allergies or types of therapy has been initiated that would change the plan or the appropriateness of the plan.  The family understand the potential benefits and risks.  Melvyn Hommes P. Stann Mainland, MD 09/28/2019 11:56 AM

## 2019-09-28 NOTE — Plan of Care (Signed)
  Problem: Education: Goal: Knowledge of General Education information will improve Description Including pain rating scale, medication(s)/side effects and non-pharmacologic comfort measures Outcome: Progressing   

## 2019-09-28 NOTE — Progress Notes (Signed)
Ord for Argatroban Indication: r/o HIT, r/o PE  Allergies  Allergen Reactions  . 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)      Patient Measurements: Height: 5\' 8"  (172.7 cm) Weight: 144 lb 2.9 oz (65.4 kg) IBW/kg (Calculated) : 68.4  Vital Signs: Temp: 97.6 F (36.4 C) (01/05 0451) Temp Source: Oral (01/05 0451) BP: 133/49 (01/05 0451) Pulse Rate: 67 (01/05 0451)  Labs: Recent Labs    09/26/19 0730 09/27/19 0351 09/28/19 0559  HGB 8.3* 8.5* 8.2*  HCT 26.8* 27.6* 27.1*  PLT 130* 164 174  APTT 59* 56* 55*  CREATININE 4.68* 4.90* 4.84*    Estimated Creatinine Clearance: 14.5 mL/min (A) (by C-G formula based on SCr of 4.84 mg/dL (H)).   Medical History: No past medical history on file.  Assessment: 19 YOM on heparin SQ for VTE prophylaxis with a >50% drop in platelets on day 7 of therapy concerning for HITT. Heparin was stopped on 12/30 and HITT testing was sent.   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 Noted RUE edema with dopplers negative for DVT, VQ scan on 1/4 also negative for PE.   The patient remains on argatroban due to thrombosis risk with HIT. The guidelines are unclear on the intended length of therapy with the patient does not have a confirmed thrombosis but could consider continuing on anticoagulation for 4 weeks. The patient could eventually transition to oral anticoagulation.   The patient's aPTT this morning is therapeutic at 55  (goal 50-90) on argatrobran 1.83mcg/kg/min. The drip is being turned off this morning due to an ortho procedure planned around noon today. Hgb is low but stable at 8.2, plts improved and up to 174. Will follow-up on restart plans post-op.   Goal of Therapy:  aPTT 50-90 seconds Monitor platelets by anticoagulation protocol: Yes   Plan:  - Argatroban on hold for ortho procedure - Will follow-up on restart plans  post-op  Thank you for allowing pharmacy to be a part of this patient's care.  Alycia Rossetti, PharmD, BCPS Clinical Pharmacist Clinical phone for 09/28/2019: [phone] 09/28/2019 8:40 AM   **Pharmacist phone directory can now be found on [email or web address] ([email or web address]).  Listed under [place].

## 2019-09-28 NOTE — Anesthesia Preprocedure Evaluation (Addendum)
Anesthesia Evaluation  Patient identified by MRN, date of birth, ID band Patient awake    Reviewed: Allergy & Precautions, NPO status , Patient's Chart, lab work & pertinent test results  Airway Mallampati: II  TM Distance: >3 FB Neck ROM: Full    Dental no notable dental hx. (+) Teeth Intact, Dental Advisory Given   Pulmonary pneumonia,    Pulmonary exam normal breath sounds clear to auscultation       Cardiovascular hypertension, Pt. on medications + Peripheral Vascular Disease  Normal cardiovascular exam Rhythm:Regular Rate:Normal     Neuro/Psych CVA negative psych ROS   GI/Hepatic negative GI ROS,   Endo/Other  diabetes, Type 2  Renal/GU Renal InsufficiencyRenal disease     Musculoskeletal R neck fx   Abdominal   Peds  Hematology  (+) anemia , Hgb 8.2 Plt 174  Hx of HIT    Anesthesia Other Findings Records indicate patient has a neck fracture.  Reproductive/Obstetrics                         Anesthesia Physical Anesthesia Plan  ASA: IV  Anesthesia Plan: General   Post-op Pain Management:    Induction: Intravenous  PONV Risk Score and Plan: Treatment may vary due to age or medical condition, Ondansetron and Dexamethasone  Airway Management Planned: Oral ETT and Video Laryngoscope Planned  Additional Equipment: None  Intra-op Plan:   Post-operative Plan: Possible Post-op intubation/ventilation  Informed Consent:     Dental advisory given  Plan Discussed with: CRNA  Anesthesia Plan Comments:        Anesthesia Quick Evaluation

## 2019-09-28 NOTE — Progress Notes (Signed)
Hospitalist progress note   Page Lancon [id number] DOB: September 15, 1956 DOA: 09/15/2019  PCP: Maryella Shivers, MD   Narrative:  64 year old white male DM 2 status post, HTN, HLD, R BKA, CKD stage IV had a couple falls at home-ambulance from 12/23 2020-911 was called patient was able to answer questions although some slurred speech was hypoxic on room air placed on nonrebreather and doxy.  T-max 93 9 became unresponsive and was intubated and central line was placed 9 sodium 149 potassium 6.  BUN/creatinine 172/10 procalcitonin 0.6 Multiple antibiotics Also found to have right neck fracture right upper lobe consolidation Patient self extubated 12/25 transferred to hospitalist service 12/27  Data Reviewed:  Sodium 149 potassium 5 BUN/26/4.8 (baseline 4.7-4.5-has come down from 8 range) bicarb 20 WBC 6.4 hemoglobin 8.2 platelet 174 VQ scan 1/4 Assessment & Plan: Acute hypoxic secondary aspiration Overall seems resolved was intubated but extubated 12/20-SLP to see ongoing with regards to diet NG tube removed 1/2 HIT confirmed Continue argatroban, VQ scan does not show any pulmonary embolism so may just need 4 weeks minimum E. coli urinary infection Treated with Rocephin earlier admission Displaced right humeral fracture with right upper Patient has gone for surgery (reverse right shoulder arthroplasty) resume anticoagulation when okay with them Pain control as per them Hypernatremia, hyperkalemia Sodium still elevated 149, placed on D5 at 50 cc/h recheck a.m. AKI superimposed on CKD 4 Creatinine has stabilized in the 4 range this may be the new normal Anemia chronic disease Delirium on admission  Subjective: Very sleepy but awakens when I agitate him significantly Tells me he is in [place] able to tell me that he is in the hospital No significant pain Aware that he is going for shoulder surgery Consultants:   Orthopedics Procedures:   None yet Antimicrobials:   Perioperative at  present time   Objective: Vitals:   09/27/19 0849 09/27/19 1639 09/27/19 2125 09/28/19 0451  BP: 133/82 (!) 147/76 104/69 (!) 133/49  Pulse: 70 65 68 67  Resp: (!) _0 Temp: 98.7 F (37.1 C) 97.9 F (36.6 C) 98.2 F (36.8 C) 97.6 F (36.4 C)  TempSrc: Oral Oral Oral Oral  SpO2: 94% 97% 96% 94%  Weight:      Height:        Intake/Output Summary (Last 24 hours) at 09/28/2019 0735 Last data filed at 09/27/2019 2300 Gross per 24 hour  Intake 100 ml  Output 900 ml  Net -800 ml   Filed Weights   09/24/19 2042 09/25/19 2041 09/26/19 2121  Weight: 68.1 kg 65.3 kg 65.4 kg    Examination: Awake coherent no distress Chest clear no added sound no rales no rhonchi Abdomen soft nontender no rebound no guarding Neurologically intact Large bandage on right upper extremity  Scheduled Meds: . sodium chloride   Intravenous Once  . amLODipine  10 mg Per Tube Daily  . chlorhexidine  60 mL Topical Once  . chlorhexidine gluconate (MEDLINE KIT)  15 mL Mouth Rinse BID  . Chlorhexidine Gluconate Cloth  6 each Topical Daily  . darbepoetin (ARANESP) injection - NON-DIALYSIS  100 mcg Subcutaneous Q Thu-1800  . feeding supplement (PRO-STAT SUGAR FREE 64)  30 mL Per Tube TID  . free water  300 mL Per Tube Q6H  . insulin aspart  0-5 Units Subcutaneous QHS  . insulin aspart  0-9 Units Subcutaneous TID WC  . mupirocin ointment   Nasal BID  . pantoprazole (PROTONIX) IV  40 mg Intravenous QHS  Continuous Infusions: . sodium chloride 10 mL/hr at 09/21/19 1100  . argatroban 1.4 mcg/kg/min (09/28/19 0315)  .  ceFAZolin (ANCEF) IV    . feeding supplement (OSMOLITE 1.2 CAL) 0 mL (09/23/19 1104)  . vancomycin       LOS: 13 days   Time spent: [place], MD [facility]  09/28/2019, 7:35 AM

## 2019-09-28 NOTE — Brief Op Note (Signed)
09/28/2019  2:56 PM  PATIENT:  Ryan Villa  64 y.o. male  PRE-OPERATIVE DIAGNOSIS:  Right Proximal Humerus Fracture  POST-OPERATIVE DIAGNOSIS:  Right Proximal Humerus Fracture  PROCEDURE:  Procedure(s): RIGHT REVERSE SHOULDER ARTHROPLASTY (Right)  SURGEON:  Surgeon(s) and Role:    * Nicholes Stairs, MD - Primary  PHYSICIAN ASSISTANT:   ASSISTANTS: Laure Kidney, RNFA   ANESTHESIA:   local and general  EBL:  250 mL   BLOOD ADMINISTERED:none  DRAINS: none   LOCAL MEDICATIONS USED:  MARCAINE     SPECIMEN:  No Specimen  DISPOSITION OF SPECIMEN:  N/A  COUNTS:  YES  TOURNIQUET:  * No tourniquets in log *  DICTATION: .Note written in EPIC  PLAN OF CARE: Admit to inpatient   PATIENT DISPOSITION:  PACU - guarded condition.   Delay start of Pharmacological VTE agent (>24hrs) due to surgical blood loss or risk of bleeding: yes

## 2019-09-28 NOTE — Progress Notes (Signed)
PHARMACY NOTE:  ANTIMICROBIAL RENAL DOSAGE ADJUSTMENT  Current antimicrobial regimen includes a mismatch between antimicrobial dosage and estimated renal function.  As per policy approved by the Pharmacy & Therapeutics and Medical Executive Committees, the antimicrobial dosage will be adjusted accordingly.  Current antimicrobial dosage:  Cefazolin 1 gm IV Q 6 hrs X 3 doses post op  Indication: Surgical prophylaxis  Renal Function:  Estimated Creatinine Clearance: 14.5 mL/min (A) (by C-G formula based on SCr of 4.84 mg/dL (H)). []      On intermittent HD, scheduled: []      On CRRT    Antimicrobial dosage has been changed to:  Cefazolin 1 gm IV Q 12 hrs X 2 doses post op   Thank you for allowing pharmacy to be a part of this patient's care.  Gillermina Hu, PharmD, BCPS, [facility] Clinical Pharmacist 09/28/2019 6:17 PM

## 2019-09-28 NOTE — Progress Notes (Signed)
[place] for Argatroban Indication: r/o HIT, r/o PE  Allergies  Allergen Reactions  . 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   Medical History: History reviewed. No pertinent past medical history.  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 Heparin was stopped on 12/30 and HITT testing was sent.   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 Noted RUE edema with dopplers negative for DVT, VQ scan on 1/4 also negative for PE.   The patient remains on argatroban due to thrombosis risk with HIT. The guidelines are unclear on the intended length of therapy with the patient does not have a confirmed thrombosis but could consider continuing on anticoagulation for 4 weeks. The patient could eventually transition to oral anticoagulation.   He is now s/p surgery and per OR note, argatroban can restart at midnight. He was previously at 1.4 mcg/kg/min and aPTT was 55  Goal of Therapy:  aPTT 50-90 seconds Monitor platelets by anticoagulation protocol: Yes   Plan:  -Restart argatroban at midnight at 1.5 mcg/kg/min -aPTT in 4 hours and daily  Hildred Laser, PharmD Clinical Pharmacist **Pharmacist phone directory can now be found on [email or web address] ([email or web address]).  Listed under [place].     **Pharmacist phone directory can now be found on [email or web address] ([email or web address]).  Listed under [place].

## 2019-09-28 NOTE — Op Note (Signed)
Date: 09/28/2019   PRE-OPERATIVE DIAGNOSIS:  Right 4 part proximal humerus fracture   POST-OPERATIVE DIAGNOSIS:  Same   PROCEDURE:  1. REVERSE SHOULDER ARTHROPLASTY  SURGEON:  Nicholes Stairs, MD   ASSISTANT: Laure Kidney, RNFA   ANESTHESIA:   General    ESTIMATED BLOOD LOSS: 250 cc   PREOPERATIVE INDICATIONS:    [name] is an unfortunate gentleman who was found down after about 3 days in his residence.  His sister was concerned for his overall health and called the EMS.  He presented to an outside facility where he was in acute respiratory failure and kidney failure.  He was then transported to our hospital where he was admitted to the ICU for multisystem organ failure.  He was ultimately resuscitated and extubated.  He has a comminuted and displaced four-part right proximal humerus fracture, and was indicated for reverse shoulder arthroplasty given the nature of the fracture and his presence of a previous right lower extremity amputation and need to mobilize early with weightbearing through the right upper extremity.  The risks benefits and alternatives were discussed with the patient preoperatively including but not limited to the risks of infection, bleeding, nerve injury, cardiopulmonary complications, the need for revision surgery, dislocation, brachial plexus palsy, incomplete relief of pain, among others, and the patient was willing to proceed. The patient's healthcare power of attorney provided informed consent.   OPERATIVE IMPLANTS:  Arthrex size 6  humeral  stem press fitted  24 +2 baseplate 39 +4 gleno sphere 39 neurtral suture cup 39 + 3 poly    OPERATIVE FINDINGS:   This patient presented with a very medially displaced humeral shaft component to this complex proximal humerus fracture.  The humeral shaft was displaced medially by the forces of the pectoralis tendon and had completely severed the conjoined tendon about 2 cm distal to the tip of the coracoid.   Furthermore, due to the 2-week delay in definitive treatment secondary to his complicated medical profile, the humeral shaft to become quite shortened and scarred into the medial position.  We did carefully dissect the fibrotic tissue off of the medial shaft.  Care was taken not to violate any of the neurovascular structures medial to the conjoined tendon.  Otherwise, the rotator cuff itself on the humeral head component was intact.  We utilized the fracture through the greater tuberosity and lesser tuberosity to preserve the rotator cuff musculature.   OPERATIVE PROCEDURE: The patient was brought to the operating room and placed in the supine position. General anesthesia was administered. IV antibiotics were given. Time out was performed. The upper extremity was prepped and draped in usual sterile fashion. The patient was in a beachchair position. Deltopectoral approach was carried out. After dissection through skin and subcutaneous fat, the cephalic vein was identified with the deltopectoral interval.  This was mobilized and taken medial.   The fracture was identified and working through the fracture in the biceps groove the lesser and greater tuberosities were freed up and tagged with #2 Fiber wire sutures.  The humeral head was fragmented and removed from the wound.       I then performed circumferential releases of the humerus.  I then moved to sizing the humerus.  There was no calcar bone loss and this was used to reference the height of the stem, as was the upper border of the pectoralis major tendon.  The canal was reamed and found to fit best with a 6 stem.  We did ultimately,  have to countersink the size 6 to get good rotational stability.  This works to our advantage as the proximal musculature had contracted and created a tight fit with a size 7.  With the size 6 stem we had excellent fit and good humeral head height.   We next turned to the glenoid.  Deep retractors were placed, and I resected  the labrum as well as the residual long head of biceps, and then placed a guidepin into the center position on the glenoid, with slight inferior declination. I then reamed over the guidepin, and this created a small metaphyseal cancellus blush inferiorly, removing just the cartilage to the subchondral bone superiorly. The base plate was selected and impacted place, and then I secured it centrally with a nonlocking screw, and I had excellent purchase both inferiorly and superiorly. I placed a short locking screws on anterior aspect,  And posterior aspect.   I then turned my attention to the glenosphere, and impacted this into place, placing slight inferior offset.    The glenoid sphere was completely seated, and had engagement of the [facility] taper. I then turned my attention back to the humerus.    The 6 mm stem was seated to the appropriate height to allow approximate 5.6 cm from the top of the Pectoralis major tendon to the top of the glenosphere.  The stem was placed in 30 degrees of retroversion.   Once the stem was impacted into place we trialed poly liners.  Using the standard humeral metaglen,  The shoulder had excellent motion, and was stable.  The final poly was impacted and again showed good motion and stability.  Next, I irrigated the wounds copiously.    The greater tuberosity was brought back to the humeral stem suture holes and secured with bone graft from the humeral head as augment.  The lesser tuberosity was unfortunately not able to be brought back to the stem due to lateralization from the  glenosphere, but the deltoid was noted to have excellent tension.  The axillary nerve was palpated at the end of implanting, and found to be in continuity and not under undo tension.   I then irrigated the shoulder copiously once more, a gram of vancomycin powder was placed in the deltopectoral interval, repaired the deltopectoral interval with Vicryl followed by staples for the skin and sterile gauze  for the skin. The patient was awakened and returned back in stable and satisfactory condition. There no complications and he tolerated the procedure well.  All counts were correct.   The patient awakened from general anesthesia with no complications and transferred to PACU in stable condition.   Postoperative Plan: Ryan Villa will remain in his sling until her regional block has worn off, but is ok to remove it for use with his walker and may weight-bear through the right upper extremity when using his walker.  Otherwise, no lifting with the right arm.  He will be allowed with passive forward elevation and internal rotation but no active elevation, abduction or rotation.  He may begin his anticoagulant infusion tonight at midnight.  He will return back to the medicine service for continuation of his multisystem care.

## 2019-09-29 ENCOUNTER — Encounter: Payer: Self-pay | Admitting: *Deleted

## 2019-09-29 DIAGNOSIS — Z96611 Presence of right artificial shoulder joint: Secondary | ICD-10-CM

## 2019-09-29 LAB — CBC WITH DIFFERENTIAL/PLATELET
Abs Immature Granulocytes: 0.03 10*3/uL (ref 0.00–0.07)
Basophils Absolute: 0 10*3/uL (ref 0.0–0.1)
Basophils Relative: 0 %
Eosinophils Absolute: 0 10*3/uL (ref 0.0–0.5)
Eosinophils Relative: 0 %
HCT: 24 % — ABNORMAL LOW (ref 39.0–52.0)
Hemoglobin: 7.2 g/dL — ABNORMAL LOW (ref 13.0–17.0)
Immature Granulocytes: 0 %
Lymphocytes Relative: 10 %
Lymphs Abs: 1 10*3/uL (ref 0.7–4.0)
MCH: 30.4 pg (ref 26.0–34.0)
MCHC: 30 g/dL (ref 30.0–36.0)
MCV: 101.3 fL — ABNORMAL HIGH (ref 80.0–100.0)
Monocytes Absolute: 0.7 10*3/uL (ref 0.1–1.0)
Monocytes Relative: 7 %
Neutro Abs: 8 10*3/uL — ABNORMAL HIGH (ref 1.7–7.7)
Neutrophils Relative %: 83 %
Platelets: 160 10*3/uL (ref 150–400)
RBC: 2.37 MIL/uL — ABNORMAL LOW (ref 4.22–5.81)
RDW: 15.7 % — ABNORMAL HIGH (ref 11.5–15.5)
WBC: 9.7 10*3/uL (ref 4.0–10.5)
nRBC: 0 % (ref 0.0–0.2)

## 2019-09-29 LAB — POTASSIUM: Potassium: 5.2 mmol/L — ABNORMAL HIGH (ref 3.5–5.1)

## 2019-09-29 LAB — BASIC METABOLIC PANEL
Anion gap: 11 (ref 5–15)
BUN: 75 mg/dL — ABNORMAL HIGH (ref 8–23)
CO2: 15 mmol/L — ABNORMAL LOW (ref 22–32)
Calcium: 7.7 mg/dL — ABNORMAL LOW (ref 8.9–10.3)
Chloride: 119 mmol/L — ABNORMAL HIGH (ref 98–111)
Creatinine, Ser: 5.23 mg/dL — ABNORMAL HIGH (ref 0.61–1.24)
GFR calc Af Amer: 13 mL/min — ABNORMAL LOW (ref >60)
GFR calc non Af Amer: 11 mL/min — ABNORMAL LOW (ref >60)
Glucose, Bld: 190 mg/dL — ABNORMAL HIGH (ref 70–99)
Potassium: 5.6 mmol/L — ABNORMAL HIGH (ref 3.5–5.1)
Sodium: 145 mmol/L (ref 135–145)

## 2019-09-29 LAB — RENAL FUNCTION PANEL
Albumin: 2.1 g/dL — ABNORMAL LOW (ref 3.5–5.0)
Anion gap: 10 (ref 5–15)
BUN: 76 mg/dL — ABNORMAL HIGH (ref 8–23)
CO2: 21 mmol/L — ABNORMAL LOW (ref 22–32)
Calcium: 7.7 mg/dL — ABNORMAL LOW (ref 8.9–10.3)
Chloride: 116 mmol/L — ABNORMAL HIGH (ref 98–111)
Creatinine, Ser: 5.07 mg/dL — ABNORMAL HIGH (ref 0.61–1.24)
GFR calc Af Amer: 13 mL/min — ABNORMAL LOW (ref >60)
GFR calc non Af Amer: 11 mL/min — ABNORMAL LOW (ref >60)
Glucose, Bld: 229 mg/dL — ABNORMAL HIGH (ref 70–99)
Phosphorus: 3.9 mg/dL (ref 2.5–4.6)
Potassium: 6 mmol/L — ABNORMAL HIGH (ref 3.5–5.1)
Sodium: 147 mmol/L — ABNORMAL HIGH (ref 135–145)

## 2019-09-29 LAB — GLUCOSE, CAPILLARY
Glucose-Capillary: 156 mg/dL — ABNORMAL HIGH (ref 70–99)
Glucose-Capillary: 200 mg/dL — ABNORMAL HIGH (ref 70–99)
Glucose-Capillary: 184 mg/dL — ABNORMAL HIGH (ref 70–99)
Glucose-Capillary: 168 mg/dL — ABNORMAL HIGH (ref 70–99)

## 2019-09-29 LAB — APTT
aPTT: 54 seconds — ABNORMAL HIGH (ref 24–36)
aPTT: 49 seconds — ABNORMAL HIGH (ref 24–36)
aPTT: 61 seconds — ABNORMAL HIGH (ref 24–36)

## 2019-09-29 MED ORDER — PANTOPRAZOLE SODIUM 40 MG PO TBEC
40.0000 mg | DELAYED_RELEASE_TABLET | Freq: Every day | ORAL | Status: DC
  Administered 2019-09-29 – 2019-09-30 (×2): 40 mg via ORAL
  Filled 2019-09-29 (×2): qty 1

## 2019-09-29 MED ORDER — SODIUM ZIRCONIUM CYCLOSILICATE 10 G PO PACK
10.0000 g | PACK | Freq: Two times a day (BID) | ORAL | Status: DC
  Administered 2019-09-29 – 2019-10-04 (×11): 10 g via ORAL
  Filled 2019-09-29 (×11): qty 1

## 2019-09-29 MED ORDER — DARBEPOETIN ALFA 150 MCG/0.3ML IJ SOSY
150.0000 ug | PREFILLED_SYRINGE | INTRAMUSCULAR | Status: DC
  Administered 2019-09-30 – 2019-10-07 (×2): 150 ug via SUBCUTANEOUS
  Filled 2019-09-29 (×3): qty 0.3

## 2019-09-29 MED ORDER — NEPRO/CARBSTEADY PO LIQD
237.0000 mL | Freq: Three times a day (TID) | ORAL | Status: DC
  Administered 2019-09-29 – 2019-09-30 (×5): 237 mL via ORAL

## 2019-09-29 NOTE — Progress Notes (Signed)
Ryan Villa for Argatroban Indication: r/o HIT, r/o PE  Allergies  Allergen Reactions  . 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   Medical History: History reviewed. No pertinent past medical history.  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 Heparin was stopped on 12/30 and HITT testing was sent.   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 Noted RUE edema with dopplers negative for DVT, VQ scan on 1/4 also negative for PE.   The patient remains on argatroban due to thrombosis risk with HIT. The guidelines are unclear on the intended length of therapy with the patient does not have a confirmed thrombosis but could consider continuing on anticoagulation for 4 weeks. The patient could eventually transition to oral anticoagulation.   The patient's argatroban was restarted at midnight after the patient's ortho surgery. An aPTT this morning was therapeutic at 54 with a confirmatory level slightly SUBtherapeutic at 49 (goal 50-90). Hgb has dropped to 7.2 << 8.2 likely related to the surgery, plts 160 and stable.   Goal of Therapy:  aPTT 50-90 seconds Monitor platelets by anticoagulation protocol: Yes   Plan:  - Increase Argatroban slightly to 1.6 mcg/kg/min - Per discussion with  MD, likely to transition to po [facility] tomorrow if Hgb stable - Will continue to monitor for any signs/symptoms of bleeding and will follow up with aPTT in the a.m.   Thank you for allowing pharmacy to be a part of this patient's care.  Alycia Rossetti, PharmD, BCPS Clinical Pharmacist Clinical phone for 09/29/2019: [phone] 09/29/2019 9:36 AM   **Pharmacist phone directory can now be found on [email or web address] ([email or web address]).  Listed under [place].

## 2019-09-29 NOTE — Progress Notes (Signed)
  Speech Language Pathology Treatment: Dysphagia  Patient Details Name: Ryan Villa MRN: [id number] DOB: 20-Feb-1956 Today's Date: 09/29/2019 Time: [id number] SLP Time Calculation (min) (ACUTE ONLY): 16 min  Assessment / Plan / Recommendation Clinical Impression  Pt seen for ongoing dysphagia therapy, utilization of compensatory swallow strategies and safety with current diet recommendations. Current order in chart is incorrect with nursing aware and only administering nectar thick liquids as prior diet order had instructed. This Probation officer revised present order to read dysphagia 3 with nectar thick liquids. As SLP entered pt's room, he begin calling out for water. Pt with no recall of need for nectar thick liquids and education provided on silent aspiration and need to tuck his chin to prevent aspiration. With Mod A visual and verbal cues, pt able to effectively demonstrate chin tuck and was able to produce with only supervision verbal cues after several sips. Pt was free of overt s/s of aspiration (is known silent aspirator when not using chin tuck). At this time, current diet continues to be appropriate. Nursing is aware of recommendations and states that pt is good with using chin tuck. ST to follow.    HPI HPI: 64 year old male transferred from [facility] for acute respiratory failure, rhabdomyolysis (fell and laid on floor 3 days), acute kidney injury. PMH: CKD stage IV, diabetes mellitus type 2, hyperlipidemia, hypertension, left carotid stenosis, intracerebral hemorrhage, status post right BKA. Per chart EMS reported that the patient's sister was bringing him food while he was lying on the floor during that 3-day period. Intubated 12/23-12/24 (self extubated). Found to have right humeral neck fracture and right upper lobe consolidation per chest x-ray, head CT no acute intracranial process,   Pt underwent MBS 12/28 with findings of severe dysphagia - asp of nectar/honey thick.  Repeat MBS advised  prior to po administration given silent nature of dysphagia. SLP notified by RN that MD wants pt reevaluated as he is more alert with plan for Cortrak to be removed.      SLP Plan  Continue with current plan of care       Recommendations  Diet recommendations: Dysphagia 3 (mechanical soft);Nectar-thick liquid Liquids provided via: Straw Medication Administration: Whole meds with puree Supervision: Full supervision/cueing for compensatory strategies;Staff to assist with self feeding;Patient able to self feed Compensations: Chin tuck;Clear throat intermittently Postural Changes and/or Swallow Maneuvers: Out of bed for meals;Seated upright 90 degrees                Oral Care Recommendations: Oral care QID Follow up Recommendations: Skilled Nursing facility SLP Visit Diagnosis: Dysphagia, oropharyngeal phase (R13.12) Plan: Continue with current plan of care       GO                Tawnia Schirm 09/29/2019, 1:28 PM

## 2019-09-29 NOTE — Anesthesia Postprocedure Evaluation (Signed)
Anesthesia Post Note  Patient: Ryan Villa  Procedure(s) Performed: RIGHT REVERSE SHOULDER ARTHROPLASTY (Right Shoulder)     Patient location during evaluation: PACU Anesthesia Type: General Level of consciousness: awake and alert Pain management: pain level controlled Vital Signs Assessment: post-procedure vital signs reviewed and stable Respiratory status: spontaneous breathing, nonlabored ventilation, respiratory function stable and patient connected to nasal cannula oxygen Cardiovascular status: blood pressure returned to baseline and stable Postop Assessment: no apparent nausea or vomiting Anesthetic complications: no    Last Vitals:  Vitals:   09/29/19 0545 09/29/19 0647  BP: (!) 95/44 (!) 139/53  Pulse: 69   Resp: 16   Temp: (!) 36.4 C   SpO2: 96%     Last Pain:  Vitals:   09/29/19 0545  TempSrc: Oral  PainSc:                  [place]et [place]

## 2019-09-29 NOTE — Progress Notes (Signed)
[facility] Progress Note    Assessment/Plan: **CKD 5:  Renal function relatively stable but noted MAP did hit 50 during OR yesteday, may have some tubular injury.  UOP good yesterday.  Will monitor in following days given hypotension.  **Hyperkalemia: expect just K load with operative tissue damage + CKD 5.  Lokelma 10 today, will recheck afternoon K at 4pm  **Anemia: primary plans transfusion once K controlled. Iron replete 1 week ago. On ESA, will increase dose.   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 Was seen earlier in admission by [facility] and s/o -- 63M DM, HTN, vascular disease with CKD 4-5 with Cr ~5 at baseline.  Followed by Dr. Sanford who signed off 09/21/19.   Yesterday underwent operative intervention to R humeral fracture.  This AM Cr up from 4.8 to 5.1. K 6 and he received lokelma.  UOP yesterday was 1700.  Hb down from mid 8s to 7.2 this AM.   He has no complaints currently  Objective Vitals:   09/28/19 2052 09/29/19 0545 09/29/19 0647 09/29/19 0900  BP: 117/67 (!) 95/44 (!) 139/53 (!) 134/54  Pulse: 79 69  65  Resp: 16 16  17  Temp: (!) 97.4 F (36.3 C) (!) 97.5 F (36.4 C)  98 F (36.7 C)  TempSrc: Oral Oral  Oral  SpO2: 93% 96%  98%  Weight: 63.8 kg     Height:       Physical Exam General: sitting in bedside chair Heart: no rub Lungs: normal WOB, clear ant and laterally Abdomen: soft, thin Extremities: no edema Neuro: nonfocal. No asterixis  Additional Objective Labs: Basic Metabolic Panel: Recent Labs  Lab 09/27/19 0351 09/28/19 0559 09/29/19 0332  NA 146* 149* 147*  K 5.2* 5.3* 6.0*  CL 112* 118* 116*  CO2 22 20* 21*  GLUCOSE 125* 111* 229*  BUN 79* 76* 76*  CREATININE 4.90* 4.84* 5.07*   CALCIUM 7.8* 7.9* 7.7*  PHOS 4.7* 4.5 3.9   Liver Function Tests: Recent Labs  Lab 09/23/19 0543 09/27/19 0351 09/28/19 0559 09/29/19 0332  AST 20  --   --   --   ALT 23  --   --   --   ALKPHOS 56  --   --   --   BILITOT 0.5  --   --   --   PROT 4.6*  --   --   --   ALBUMIN 1.8* 2.0* 2.0* 2.1*   No results for input(s): LIPASE, AMYLASE in the last 168 hours. CBC: Recent Labs  Lab 09/23/19 0543 09/25/19 0505 09/26/19 0730 09/27/19 0351 09/28/19 0559 09/29/19 0332  WBC 10.4 9.4 10.6* 9.2 6.4 9.7  NEUTROABS 8.1*  --   --   --   --  8.0*  HGB 8.5* 8.3* 8.3* 8.5* 8.2* 7.2*  HCT 26.9* 26.7* 26.8* 27.6* 27.1* 24.0*  MCV 97.8 98.2 99.3 101.1* 101.1* 101.3*  PLT 64* 103* 130* 164 174 160   Blood Culture    Component Value Date/Time   SDES BLOOD LEFT HAND 09/15/2019 1817   SPECREQUEST  09/15/2019 1817    BOTTLES DRAWN AEROBIC AND ANAEROBIC Blood Culture results may not be optimal due to an inadequate volume of blood received in culture bottles   CULT  09/15/2019 1817    NO GROWTH 5 DAYS Performed at   [facility], [place] [address] [place], [place] [address]    REPTSTATUS 09/20/2019 FINAL 09/15/2019 1817    Cardiac Enzymes: No results for input(s): CKTOTAL, CKMB, CKMBINDEX, TROPONINI in the last 168 hours. CBG: Recent Labs  Lab 09/28/19 1151 09/28/19 1605 09/28/19 2051 09/29/19 0658 09/29/19 1119  GLUCAP 99 152* 271* 156* 200*   Iron Studies: No results for input(s): IRON, TIBC, TRANSFERRIN, FERRITIN in the last 72 hours. _0 @ Studies/Results: DG Chest 2 View  Result Date: 09/27/2019 CLINICAL DATA:  Dry cough with shortness of breath. Right arm erythema and swelling. EXAM: CHEST - 2 VIEW COMPARISON:  Radiographs 09/19/2019. CT 09/15/2019. FINDINGS: 1451 hours. Mild patient rotation to the right. The central line has been removed. The heart size and mediastinal contours are stable. There is improved aeration of the lungs which are now essentially  clear. No pneumothorax or significant pleural effusion. Displaced fracture of the right humeral neck noted. IMPRESSION: Interval improved aeration of the lungs. No acute cardiopulmonary process. Electronically Signed   By: Richardean Sale M.D.   On: 09/27/2019 15:33   NM Pulmonary Perf and Vent  Result Date: 09/27/2019 CLINICAL DATA:  Pulmonary embolism suspected.  Short of breath. EXAM: NUCLEAR MEDICINE PERFUSION LUNG SCAN TECHNIQUE: Perfusion images were obtained in multiple projections after intravenous injection of radiopharmaceutical. Ventilation scans intentionally deferred if perfusion scan and chest x-ray adequate for interpretation during COVID 19 epidemic. RADIOPHARMACEUTICALS:  1.6 mCi Tc-80mMAA IV COMPARISON:  Chest radiograph 09/27/2019 FINDINGS: No wedge-shaped peripheral perfusion defects within LEFT or RIGHT lung to suggest acute pulmonary embolism. IMPRESSION: No evidence acute pulmonary embolism. Electronically Signed   By: SSuzy BouchardM.D.   On: 09/27/2019 16:42   DG Shoulder Right Port  Result Date: 09/28/2019 CLINICAL DATA:  Right shoulder arthroplasty. EXAM: PORTABLE RIGHT SHOULDER COMPARISON:  Chest x-ray 09/27/2019. FINDINGS: Total right shoulder replacement. Fracture fragments noted about the proximal humerus. Hardware intact. Anatomic alignment. IMPRESSION: Total right shoulder replacement anatomic alignment. Fracture fragments noted about the proximal humerus. Electronically Signed   By: TMarcello Moores Register   On: 09/28/2019 15:37   Medications: . sodium chloride 10 mL/hr at 09/21/19 1100  . sodium chloride 10 mL/hr at 09/28/19 1740  . argatroban 1.5 mcg/kg/min (09/29/19 0024)  . feeding supplement (OSMOLITE 1.2 CAL) 0 mL (09/23/19 1104)   . sodium chloride   Intravenous Once  . amLODipine  10 mg Per Tube Daily  . chlorhexidine gluconate (MEDLINE KIT)  15 mL Mouth Rinse BID  . Chlorhexidine Gluconate Cloth  6 each Topical Daily  . darbepoetin (ARANESP) injection -  NON-DIALYSIS  100 mcg Subcutaneous Q Thu-1800  . docusate sodium  100 mg Oral BID  . feeding supplement (PRO-STAT SUGAR FREE 64)  30 mL Per Tube TID  . free water  300 mL Per Tube Q6H  . insulin aspart  0-5 Units Subcutaneous QHS  . insulin aspart  0-9 Units Subcutaneous TID WC  . mupirocin ointment   Nasal BID  . pantoprazole (PROTONIX) IV  40 mg Intravenous QHS  . sodium zirconium cyclosilicate  10 g Oral BID  . tranexamic acid (CYKLOKAPRON) topical -INTRAOP  2,000 mg Topical Once

## 2019-09-29 NOTE — Plan of Care (Signed)
  Problem: Education: Goal: Knowledge of General Education information will improve Description Including pain rating scale, medication(s)/side effects and non-pharmacologic comfort measures Outcome: Progressing   

## 2019-09-29 NOTE — Progress Notes (Signed)
Nutrition Follow-up  DOCUMENTATION CODES:   Severe malnutrition in context of acute illness/injury  INTERVENTION:   Pt without BM x 7 days- regimen in place    Add Nepro Shake po TID, each supplement provides 425 kcal and 19 grams protein  MVI daily   NUTRITION DIAGNOSIS:   Severe Malnutrition related to acute illness(S/P fall at home with rhabdomyolysis after lying on the floor for 3 days) as evidenced by moderate fat depletion, moderate muscle depletion, severe muscle depletion, severe fat depletion.  Ongoing   GOAL:   Patient will meet greater than or equal to 90% of their needs  Progressing PO  MONITOR:   Diet advancement, TF tolerance, Labs, Skin, I & O's  REASON FOR ASSESSMENT:   Consult Enteral/tube feeding initiation and management  ASSESSMENT:   64 yo male admitted with respiratory failure, rhabdomyolysis, and AKI after fall at home 3 days PTA. He had been lying on the floor x 3 days since his fall. Found to have PNA, UTI, and right proximal humerus fracture. PMH includes CKD-IV, HLD, DM-2, HTN, L carotid stenosis, ICH, R BKA.  12/25- extubated  12/28- failed MBS, gastric Cortrak placed 1/5- s/p R reverse shoulder arthroplasty   1/2- Cortrak removed, diet advanced DYS1/nectar thick liquids  Diet advanced this am to DYS 3, nectar thick liquids. Meal completions charted as 75-100% for his last two meals. Reports having great appetite. Does not like [employer]. Discussed the importance of protein intake for preservation of lean body mass and to promote post-op healing. Pt amendable to different supplementation.   Admission weight: 68.5 kg  Current weight: 63.8 kg   I/O: +5,970 ml since admit UOP: 1,700 ml x 24 hrs   Medications: aranesp, colace, ss novolog, lokelma   Labs: K 5.6 (H) Cr 5.23- trending up CBG 99-200   Diet Order:   Diet Order            DIET DYS 3 Room service appropriate? Yes; Fluid consistency: Nectar Thick  Diet effective now               EDUCATION NEEDS:   Not appropriate for education at this time  Skin:  skin tear x 2 to R forearm, R shoulder incision   Last BM:  12/30  Height:   Ht Readings from Last 1 Encounters:  09/28/19 5\' 8"  (1.727 m)    Weight:   Wt Readings from Last 1 Encounters:  09/28/19 63.8 kg    Ideal Body Weight:  65.5 kg(adjusted for BKA)  BMI:  Body mass index is 21.39 kg/m.  Estimated Nutritional Needs:   Kcal:  2000-2200  Protein:  110-123  Fluid:  >/= 2 L   Mariana Single RD, LDN Clinical Nutrition Pager # [phone]

## 2019-09-29 NOTE — Progress Notes (Addendum)
Patient ID: Hillary Schwegler, male   DOB: 04/02/1956, 64 y.o.   MRN: [id number]  PROGRESS NOTE    Ella Guillotte  DJS:[id number] DOB: Jan 30, 1956 DOA: 09/15/2019 PCP: Maryella Shivers, MD   Brief Narrative:  63/M with history of chronic renal disease stage IV, diabetes mellitus type 2, hyperlipidemia, hypertension, left carotid stenosis, intracerebral hemorrhage, status post right BKA presented to [facility] ER after a fall at home 3 days prior to presentation.  He was found to have hypoxia requiring nonrebreather and significant ecchymosis over the right shoulder area.  He subsequently became more hypoxic and required intubation.  Creatinine was 10.5.  CK 2411.   He was found to have right humeral neck fracture, right upper lobe consolidation per chest x-ray, - He was admitted under PCCM service, to ICU, with respiratory failure from aspiration pneumonia, started on broad-spectrum antibiotics. Nephrology and orthopedics were consulted.  He subsequently self extubated at night on 09/17/2019.  Creatinine improved with IV fluids and neurology signed off.  He was transferred to [facility] service on 09/19/2019.  Subjective: -Underwent right shoulder surgery yesterday -No events overnight, remains stable, kidney function slightly worse and potassium 6 today  Assessment & Plan:  Acute hypoxic respiratory failure, likely secondary to aspiration pneumonia -Resolved, Intubated on admission and self extubated on 12/20 overnight. Transferred to [facility] service on 09/19/2019. PCCM signed off. -Stable from the standpoint -Diet as per SLP recommendations - ongoing concern about increased aspiration risk; NG removed on 09/25/2019, continue dysphagia 1 diet/renal  Aspiration pneumonia -Covid test was negative at [facility] -Blood cultures negative, received 7 days of antibiotics, improved and stable, continue dysphagia diet  HIT, confirmed -Ab positive; SRA positive -Continues on argatroban per pharmacy -VQ scan  negative, could not perform CTA due to significantly abnormal creatinine -Continue argatroban today and if hemoglobin is stable will transition to oral Eliquis in the next 1 to 2 days for a minimum of 4 weeks, platelet count has normalized   E. coli UTI, resolved (POA). -Has completed treatment with Rocephin as above  Displaced right humerus surgical neck  Fracture - Orthopedics following.   -Underwent reverse right shoulder arthroplasty 1/5  AKI on probable CKD stage IV  - Creatinine was around 4 in 2018 in care everywhere - Creatinine 9.68 at admission, felt to be from combination of dehydration and rhabdomyolysis, improved with hydration down to mid 4 range which is felt to be his baseline, nephrology signed off -Urine output continues to be good, unfortunately potassium continues to trend up is 6.0 today and creatinine slightly higher than baseline -Give Lokelma, will discuss with nephrology-given worsening hyperkalemia -He is on a dysphagia 1 diet,/add renal restrictions  Hyperkalemia -As above  Hypernatremia -Resolved  Anemia of chronic disease -Received 1 unit of PRBC on 12/20, hemoglobin down to 7.2 today, -Given hyperkalemia will hold off on transfusion today, may benefit from this tomorrow if potassium is not any worse  Hypertension  - Stable. Continue Norvasc  Acute metabolic encephalopathy/delirium, resolved - Initial CT head with no acute findings, multiple remote lacunar strokes noted - Patient alert and oriented x3 now - Mental status waxing/waning but generally continues to improve daily  History of CVA  - LDL low.  Lipitor on hold.  Currently on argatroban, hold Plavix for next 4 weeks while on anticoagulation, after this course is completed Plavix can be resumed  Generalized conditioning -Overall prognosis is guarded to poor.  Will request palliative care evaluation for goals of care discussion   DVT prophylaxis: now  on argatraban Code Status: Full Family  Communication: No family at bedside Disposition Plan: Will likely need SNF pending improvement in kidney function, stabilization of potassium and hemoglobin Consultants: PCCM/nephrology/orthopedics Procedures: Intubation on presentation and self extubation on 09/17/2019 Right IJ central line placement on 09/15/2019  Antimicrobials:  Anti-infectives (From admission, onward)   Start     Dose/Rate Route Frequency Ordered Stop   09/28/19 2030  ceFAZolin (ANCEF) IVPB 1 g/50 mL premix     1 g 100 mL/hr over 30 Minutes Intravenous Every 12 hours 09/28/19 1624 09/29/19 0827   09/28/19 1157  vancomycin (VANCOCIN) powder  Status:  Discontinued       As needed 09/28/19 1158 09/28/19 1457   09/28/19 0600  vancomycin (VANCOCIN) IVPB 1000 mg/200 mL premix  Status:  Discontinued     1,000 mg 200 mL/hr over 60 Minutes Intravenous On call to O.R. 09/27/19 1159 09/28/19 1607   09/28/19 0000  ceFAZolin (ANCEF) IVPB 2g/100 mL premix    Note to Pharmacy: Surgery delayed until 1/5.   2 g 200 mL/hr over 30 Minutes Intravenous To Surgery 09/22/19 0904 09/28/19 1222   09/22/19 0815  ceFAZolin (ANCEF) IVPB 2g/100 mL premix  Status:  Discontinued     2 g 200 mL/hr over 30 Minutes Intravenous To Surgery 09/22/19 0806 09/22/19 0905   09/15/19 1715  azithromycin (ZITHROMAX) 500 mg in sodium chloride 0.9 % 250 mL IVPB  Status:  Discontinued     500 mg 250 mL/hr over 60 Minutes Intravenous Every 24 hours 09/15/19 1711 09/18/19 1107   09/15/19 1715  cefTRIAXone (ROCEPHIN) 1 g in sodium chloride 0.9 % 100 mL IVPB     1 g 200 mL/hr over 30 Minutes Intravenous Every 24 hours 09/15/19 1711 09/21/19 1754      Objective: Vitals:   09/28/19 2052 09/29/19 0545 09/29/19 0647 09/29/19 0900  BP: 117/67 (!) 95/44 (!) 139/53 (!) 134/54  Pulse: 79 69  65  Resp: '16 16  17  ' Temp: (!) 97.4 F (36.3 C) (!) 97.5 F (36.4 C)  98 F (36.7 C)  TempSrc: Oral Oral  Oral  SpO2: 93% 96%  98%  Weight: 63.8 kg     Height:         Intake/Output Summary (Last 24 hours) at 09/29/2019 1040 Last data filed at 09/29/2019 1002 Gross per 24 hour  Intake 819.57 ml  Output 2050 ml  Net -1230.43 ml   Filed Weights   09/26/19 2121 09/28/19 1137 09/28/19 2052  Weight: 65.4 kg 65.4 kg 63.8 kg   Examination:  [id number] ill appearing male, appears much older than stated age, AAO x2, no distress HEENT: PERRLA, Neck supple, no JVD Lungs: Diminished breath sounds the bases otherwise clear CVS: S1-S2, regular rhythm Abd: soft, Non tender, non distended, BS present Extremities: Right BKA, right shoulder with dressing  skin: no new rashes  Data Reviewed: I have personally reviewed following labs and imaging studies  CBC: Recent Labs  Lab 09/23/19 0543 09/25/19 0505 09/26/19 0730 09/27/19 0351 09/28/19 0559 09/29/19 0332  WBC 10.4 9.4 10.6* 9.2 6.4 9.7  NEUTROABS 8.1*  --   --   --   --  8.0*  HGB 8.5* 8.3* 8.3* 8.5* 8.2* 7.2*  HCT 26.9* 26.7* 26.8* 27.6* 27.1* 24.0*  MCV 97.8 98.2 99.3 101.1* 101.1* 101.3*  PLT 64* 103* 130* 164 174 707   Basic Metabolic Panel: Recent Labs  Lab 09/23/19 0543 09/25/19 0505 09/26/19 0730 09/27/19 0351 09/28/19 0559 09/29/19 6151  NA 143 148* 147* 146* 149* 147*  K 4.2 4.9 5.0 5.2* 5.3* 6.0*  CL 107 111 112* 112* 118* 116*  CO2 '26 26 24 22 ' 20* 21*  GLUCOSE 216* 147* 144* 125* 111* 229*  BUN 91* 88* 77* 79* 76* 76*  CREATININE 5.26* 4.52* 4.68* 4.90* 4.84* 5.07*  CALCIUM 7.4* 7.6* 7.8* 7.8* 7.9* 7.7*  MG 2.1  --   --   --   --   --   PHOS 5.4* 4.6 4.5 4.7* 4.5 3.9   GFR: Estimated Creatinine Clearance: 13.5 mL/min (A) (by C-G formula based on SCr of 5.07 mg/dL (H)). Liver Function Tests: Recent Labs  Lab 09/23/19 0543 09/25/19 0505 09/26/19 0730 09/27/19 0351 09/28/19 0559 09/29/19 0332  AST 20  --   --   --   --   --   ALT 23  --   --   --   --   --   ALKPHOS 56  --   --   --   --   --   BILITOT 0.5  --   --   --   --   --   PROT 4.6*  --   --   --    --   --   ALBUMIN 1.8* 1.9* 1.9* 2.0* 2.0* 2.1*   No results for input(s): LIPASE, AMYLASE in the last 168 hours. No results for input(s): AMMONIA in the last 168 hours. Coagulation Profile: Recent Labs  Lab 09/23/19 1902  INR 0.9   Cardiac Enzymes: No results for input(s): CKTOTAL, CKMB, CKMBINDEX, TROPONINI in the last 168 hours. CBG: Recent Labs  Lab 09/28/19 0658 09/28/19 1151 09/28/19 1605 09/28/19 2051 09/29/19 0658  GLUCAP 99 99 152* 271* 156*    Recent Results (from the past 240 hour(s))  SARS CORONAVIRUS 2 (TAT 6-24 HRS) Nasopharyngeal Nasopharyngeal Swab     Status: None   Collection Time: 09/20/19  5:01 PM   Specimen: Nasopharyngeal Swab  Result Value Ref Range Status   SARS Coronavirus 2 NEGATIVE NEGATIVE Final    Comment: (NOTE) SARS-CoV-2 target nucleic acids are NOT DETECTED. The SARS-CoV-2 RNA is generally detectable in upper and lower respiratory specimens during the acute phase of infection. Negative results do not preclude SARS-CoV-2 infection, do not rule out co-infections with other pathogens, and should not be used as the sole basis for treatment or other patient management decisions. Negative results must be combined with clinical observations, patient history, and epidemiological information. The expected result is Negative. Fact Sheet for Patients: SugarRoll.be Fact Sheet for Healthcare Providers: https://www.woods-mathews.com/ This test is not yet approved or cleared by the [place] FDA and  has been authorized for detection and/or diagnosis of SARS-CoV-2 by FDA under an Emergency Use Authorization (EUA). This EUA will remain  in effect (meaning this test can be used) for the duration of the COVID-19 declaration under Section 56 4(b)(1) of the Act, 21 U.S.C. section 360bbb-3(b)(1), unless the authorization is terminated or revoked sooner. Performed at [facility], [place] [address]  [place], [place] [address]   Surgical pcr screen     Status: Abnormal   Collection Time: 09/21/19  3:02 PM   Specimen: Nasal Mucosa; Nasal Swab  Result Value Ref Range Status   MRSA, PCR POSITIVE (A) NEGATIVE Final   Staphylococcus aureus POSITIVE (A) NEGATIVE Final    Comment: (NOTE) The Xpert SA Assay (FDA approved for NASAL specimens in patients 54 years of age and older), is one component  of a comprehensive surveillance program. It is not intended to diagnose infection nor to guide or monitor treatment. Performed at [facility], [place] [address], [place], [place] [address]   Surgical pcr screen     Status: Abnormal   Collection Time: 09/28/19  6:36 AM   Specimen: Nasal Mucosa; Nasal Swab  Result Value Ref Range Status   MRSA, PCR POSITIVE (A) NEGATIVE Final    Comment: RESULT CALLED TO, READ BACK BY AND VERIFIED WITH: Tawni Levy RN 9:00 09/28/19 ([email or web address])    Staphylococcus aureus POSITIVE (A) NEGATIVE Final    Comment: (NOTE) The Xpert SA Assay (FDA approved for NASAL specimens in patients 27 years of age and older), is one component of a comprehensive surveillance program. It is not intended to diagnose infection nor to guide or monitor treatment. Performed at [facility], [place] [address] [place],  [address]     Radiology Studies: DG Chest 2 View  Result Date: 09/27/2019 CLINICAL DATA:  Dry cough with shortness of breath. Right arm erythema and swelling. EXAM: CHEST - 2 VIEW COMPARISON:  Radiographs 09/19/2019. CT 09/15/2019. FINDINGS: 1451 hours. Mild patient rotation to the right. The central line has been removed. The heart size and mediastinal contours are stable. There is improved aeration of the lungs which are now essentially clear. No pneumothorax or significant pleural effusion. Displaced fracture of the right humeral neck noted. IMPRESSION: Interval improved aeration of the lungs. No acute cardiopulmonary process. Electronically Signed   By: Richardean Sale M.D.   On: 09/27/2019 15:33   NM Pulmonary Perf and Vent  Result Date: 09/27/2019 CLINICAL DATA:  Pulmonary embolism suspected.  Short of breath. EXAM: NUCLEAR MEDICINE PERFUSION LUNG SCAN TECHNIQUE: Perfusion images were obtained in multiple projections after intravenous injection of radiopharmaceutical. Ventilation scans intentionally deferred if perfusion scan and chest x-ray adequate for interpretation during COVID 19 epidemic. RADIOPHARMACEUTICALS:  1.6 mCi Tc-93mMAA IV COMPARISON:  Chest radiograph 09/27/2019 FINDINGS: No wedge-shaped peripheral perfusion defects within LEFT or RIGHT lung to suggest acute pulmonary embolism. IMPRESSION: No evidence acute pulmonary embolism. Electronically Signed   By: SSuzy BouchardM.D.   On: 09/27/2019 16:42   DG Shoulder Right Port  Result Date: 09/28/2019 CLINICAL DATA:  Right shoulder arthroplasty. EXAM: PORTABLE RIGHT SHOULDER COMPARISON:  Chest x-ray 09/27/2019. FINDINGS: Total right shoulder replacement. Fracture fragments noted about the proximal humerus. Hardware intact. Anatomic alignment. IMPRESSION: Total right shoulder replacement anatomic alignment. Fracture fragments noted about the proximal humerus. Electronically Signed   By: [place]  On: 09/28/2019 15:37    Scheduled Meds: . sodium chloride   Intravenous Once  . amLODipine  10 mg Per Tube Daily  . chlorhexidine gluconate (MEDLINE KIT)  15 mL Mouth Rinse BID  . Chlorhexidine Gluconate Cloth  6 each Topical Daily  . darbepoetin (ARANESP) injection - NON-DIALYSIS  100 mcg Subcutaneous Q Thu-1800  . docusate sodium  100 mg Oral BID  . feeding supplement (PRO-STAT SUGAR FREE 64)  30 mL Per Tube TID  . free water  300 mL Per Tube Q6H  . insulin aspart  0-5 Units Subcutaneous QHS  . insulin aspart  0-9 Units Subcutaneous TID WC  . mupirocin ointment   Nasal BID  . pantoprazole (PROTONIX) IV  40 mg Intravenous QHS  . sodium zirconium cyclosilicate  10 g Oral BID  .  tranexamic acid (CYKLOKAPRON) topical -INTRAOP  2,000 mg Topical Once   Continuous Infusions: . sodium chloride 10 mL/hr  at 09/21/19 1100  . sodium chloride 10 mL/hr at 09/28/19 1740  . argatroban 1.5 mcg/kg/min (09/29/19 0024)  . feeding supplement (OSMOLITE 1.2 CAL) 0 mL (09/23/19 1104)   Time spent: 59mn  PDomenic Polite MD [facility] 09/29/2019, 10:40 AM

## 2019-09-29 NOTE — Progress Notes (Signed)
Occupational Therapy Treatment Patient Details Name: Ryan Villa MRN: [id number] DOB: [birth year] Today's Date: 09/29/2019    History of present illness Pt adm with rt humeral fx after fall at home and in the floor x 3 days. Pt intubated 12/23 and self extubated 12/25. Pt with acute metabolic encephalopathy and delirium, PMH - rt bka, ckd, dm, htn, iintracerebral hemorrhage. Pt now s/p R TSA 1/5   OT comments  Pt seen for OT re-eval following R TSA. Goals remain appropriate from original care plan. Pt needing mod A to complete bed mobility and maintain sitting balance EOB to manage sling and for peri care. Pt soiled with urine, had pulled condom cath off and seemed unaware of this. Pt current cognitive level limiting ability to process new R shoulder precautions and sling management. Sling repositioned EOB, pt having difficulty engaging in exercises considering still slightly numb from sx. He is able to open close hand this date, refuses OT touch elbow. Noted prosthetic present in room, pt adamantly refusing use stating he is missing part to it when all parts appeared present. Completed squat pivot transfer with max A +2 to chair. Pt perseverative on having water, followed up with SLP for confirmation of orders. D/c recs remain appropriate. OT will continue to follow.    Follow Up Recommendations  SNF;Supervision/Assistance - 24 hour    Equipment Recommendations  None recommended by OT    Recommendations for Other Services      Precautions / Restrictions Precautions Precautions: Fall;Shoulder Type of Shoulder Precautions: okay to WB with transfers, otherwise no lifting; sling on when not ambulating or doing BADLs, okay for pendulums; no abduction; ER: 30 degrees; no AROM; okay hand wrist and elbow AROM Precaution Comments: R humerus ORIF 1/5 Required Braces or Orthoses: Sling Restrictions Weight Bearing Restrictions: Yes RUE Weight Bearing: Non weight bearing Other Position/Activity  Restrictions: able to bear weight with transfers, although pt unable to tolerate this date       Mobility Bed Mobility Overal bed mobility: Needs Assistance Bed Mobility: Supine to Sit     Supine to sit: Mod assist     General bed mobility comments: mod A for transfer to EOB, needing mod A to maintain sitting balance safely, flucutating to control sitting balance EOB  Transfers Overall transfer level: Needs assistance Equipment used: 1 person hand held assist Transfers: Sit to/from [employer] pivot transfers: Max assist;+2 physical assistance;+2 safety/equipment     General transfer comment: max A +2, increased support on R side    Balance Overall balance assessment: Needs assistance Sitting-balance support: Single extremity supported;No upper extremity supported;Feet supported Sitting balance-Leahy Scale: Poor Sitting balance - Comments: fluctuating to mod A for sitting balance with weight shift and donning gown and fixing sling       Standing balance comment: unable to attain full stand                           ADL either performed or assessed with clinical judgement   ADL Overall ADL's : Needs assistance/impaired     Grooming: Moderate assistance;Sitting   Upper Body Bathing: Maximal assistance;Sitting   Lower Body Bathing: Total assistance;+2 for physical assistance;Sitting/lateral leans   Upper Body Dressing : Total assistance;Sitting Upper Body Dressing Details (indicate cue type and reason): to don sling and new gown, pt soiled and unaware Lower Body Dressing: Total assistance;+2 for physical assistance;Sitting/lateral leans   Toilet Transfer: Maximal assistance;+2  for physical assistance;+2 for safety/equipment;Squat-pivot Toilet Transfer Details (indicate cue type and reason): simulated through squat pivot to recliner Toileting- Clothing Manipulation and Hygiene: Total assistance Toileting - Clothing Manipulation  Details (indicate cue type and reason): soiled with urine and unaware     Functional mobility during ADLs: Maximal assistance;+2 for physical assistance;+2 for safety/equipment(squat pivot only) General ADL Comments: pt limited by RUE precautions  being dominant arm for BADL, impaired cognition, gneralized weakness with decreased activity tolerance, and poor balance for safe and independent BADL     Vision   Vision Assessment?: No apparent visual deficits   Perception     Praxis      Cognition Arousal/Alertness: Awake/alert Behavior During Therapy: Flat affect Overall Cognitive Status: Impaired/Different from baseline Area of Impairment: Orientation;Attention;Memory;Following commands;Safety/judgement;Awareness;Problem solving                 Orientation Level: Time;Situation Current Attention Level: Selective Memory: Decreased recall of precautions;Decreased short-term memory Following Commands: Follows one step commands inconsistently;Follows one step commands with increased time Safety/Judgement: Decreased awareness of safety;Decreased awareness of deficits Awareness: Intellectual Problem Solving: Slow processing;Difficulty sequencing;Decreased initiation;Requires verbal cues;Requires tactile cues General Comments: perseverating on having water; limited safety awareness and processing in session with cues and tasks. Limited cognition to comprehend new shoulder precautions and restrictions        Exercises     Shoulder Instructions       General Comments      Pertinent Vitals/ Pain       Pain Assessment: Faces Faces Pain Scale: Hurts a little bit Pain Location: grimacing, refused elbow movement due to pain Pain Descriptors / Indicators: Grimacing;Guarding Pain Intervention(s): Monitored during session;Repositioned  Home Living                                          Prior Functioning/Environment              Frequency  Min 2X/week         Progress Toward Goals  OT Goals(current goals can now be found in the care plan section)  Progress towards OT goals: Progressing toward goals  Acute Rehab OT Goals Patient Stated Goal: none stated OT Goal Formulation: With patient Time For Goal Achievement: 10/06/19 Potential to Achieve Goals: Good  Plan Discharge plan remains appropriate;Frequency remains appropriate    Co-evaluation                 AM-PAC OT "6 Clicks" Daily Activity     Outcome Measure   Help from another person eating meals?: Total Help from another person taking care of personal grooming?: A Lot Help from another person toileting, which includes using toliet, bedpan, or urinal?: Total Help from another person bathing (including washing, rinsing, drying)?: A Lot Help from another person to put on and taking off regular upper body clothing?: A Lot Help from another person to put on and taking off regular lower body clothing?: Total 6 Click Score: 9    End of Session Equipment Utilized During Treatment: Gait belt  OT Visit Diagnosis: Other abnormalities of gait and mobility (R26.89);Muscle weakness (generalized) (M62.81);History of falling (Z91.81);Pain;Other symptoms and signs involving cognitive function Pain - Right/Left: Right Pain - part of body: Shoulder;Arm   Activity Tolerance Patient tolerated treatment well   Patient Left in chair;with call bell/phone within reach;with chair alarm set   Nurse Communication Mobility status;Other (  comment)(pt asking for water, condom cath needs replaced)        Time: 1040-1100 OT Time Calculation (min): 20 min  Charges: OT General Charges $OT Visit: 1 Visit OT Evaluation $OT Re-eval: 1 Re-eval   Zenovia Jarred, MSOT, OTR/L Acute Rehabilitation Services [facility] Office Number: [phone]  Zenovia Jarred 09/29/2019, 11:45 AM

## 2019-09-29 NOTE — Care Management Important Message (Signed)
Important Message  Patient Details  Name: Ryan Villa MRN: [id number] Date of Birth: Nov 04, 1955   Medicare Important Message Given:  Yes     Orbie Pyo 09/29/2019, 2:41 PM

## 2019-09-29 NOTE — Progress Notes (Signed)
   Subjective:  Patient reports pain as No complaints of shoulder pain at this time.  No acute overnight events.  Objective:   VITALS:   Vitals:   09/29/19 0647 09/29/19 0900 09/29/19 1622 09/29/19 2033  BP: (!) 139/53 (!) 134/54 (!) 129/59 (!) 128/46  Pulse:  65 69 75  Resp:  17 16 15   Temp:  98 F (36.7 C) 98.3 F (36.8 C) 98.4 F (36.9 C)  TempSrc:  Oral Oral   SpO2:  98% 100% 95%  Weight:      Height:        Neurologically intact Sensation intact distally Bandages clean, dry, and intact.  He continues with moderate swelling down distal to the elbow.  He has motor intact.  He endorses sensation intact.  Sling is in place.  Lab Results  Component Value Date   WBC 9.7 09/29/2019   HGB 7.2 (L) 09/29/2019   HCT 24.0 (L) 09/29/2019   MCV 101.3 (H) 09/29/2019   PLT 160 09/29/2019   BMET    Component Value Date/Time   NA 145 09/29/2019 1255   K 5.2 (H) 09/29/2019 1816   CL 119 (H) 09/29/2019 1255   CO2 15 (L) 09/29/2019 1255   GLUCOSE 190 (H) 09/29/2019 1255   BUN 75 (H) 09/29/2019 1255   CREATININE 5.23 (H) 09/29/2019 1255   CALCIUM 7.7 (L) 09/29/2019 1255   GFRNONAA 11 (L) 09/29/2019 1255   GFRAA 13 (L) 09/29/2019 1255     Assessment/Plan: 1 Day Post-Op   Principal Problem:   Closed fracture of right proximal humerus Active Problems:   Acute respiratory failure (HCC)   AKI (acute kidney injury) ([place])   Rhabdomyolysis   Acute renal failure (ARF) (HCC)   Pressure injury of skin   Protein-calorie malnutrition, severe   Encounter for feeding tube placement   Up with therapy  - Sling at all times other than when weightbearing on his walker or doing activities of daily living.  I am okay with him weightbearing through the right upper extremity on his walker.  Otherwise no lifting.  - May change dressing as needed if saturates.   Nicholes Stairs 09/29/2019, 8:34 PM   Geralynn Rile, MD [phone]

## 2019-09-29 NOTE — Progress Notes (Signed)
[place] for Argatroban Indication: r/o HIT, r/o PE  Allergies  Allergen Reactions  . 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)      Patient Measurements: Height: 5\' 8"  (172.7 cm) Weight: 140 lb 10.5 oz (63.8 kg) IBW/kg (Calculated) : 68.4  Vital Signs: Temp: 98.3 F (36.8 C) (01/06 1622) Temp Source: Oral (01/06 1622) BP: 129/59 (01/06 1622) Pulse Rate: 69 (01/06 1622)  Labs: Recent Labs    09/27/19 0351 09/28/19 0559 09/29/19 0332 09/29/19 1255 09/29/19 1816  HGB 8.5* 8.2* 7.2*  --   --   HCT 27.6* 27.1* 24.0*  --   --   PLT 164 174 160  --   --   APTT 56* 55* 54* 49* 61*  CREATININE 4.90* 4.84* 5.07* 5.23*  --     Estimated Creatinine Clearance: 13 mL/min (A) (by C-G formula based on SCr of 5.23 mg/dL (H)).   Medical History: History reviewed. No pertinent past medical history.  Assessment: 64 yr old male on heparin SQ for VTE prophylaxis with a >50% drop in platelets on day 7 of therapy, concerning for HIT. Heparin was stopped on 12/30, and HIT testing was sent.   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 Noted RUE edema with dopplers negative for DVT, VQ scan on 1/4 also negative for PE.   The patient remains on argatroban due to thrombosis risk with HIT. The guidelines are unclear on the intended length of therapy when the patient does not have a confirmed thrombosis, but could consider continuing on anticoagulation for 4 weeks. The patient could eventually transition to oral anticoagulation.   The patient's argatroban was restarted at midnight after the patient's ortho surgery. aPTT this evening after argatroban infusion was increased to 1.6 mcg/kg/min earlier today was 61 sec, which is within the goal range for this pt. With AM labs today, hgb has dropped to 7.2 << 8.2 likely related to the surgery, plts 160 and stable. Per RN, no issues with IV or  bleeding observed.  Goal of Therapy:  aPTT 50-90 seconds Monitor platelets by anticoagulation protocol: Yes   Plan:  Continue argatroban at 1.6 mcg/kg/min Monitor aPTT with AM labs Per discussion with MD, likely to transition to po [facility] tomorrow if Hgb stable Monitor for signs/symptoms of bleeding  Thank you for allowing pharmacy to be a part of this patient's care.  Gillermina Hu, PharmD, BCPS, [facility] Clinical Pharmacist 09/29/2019 7:20 PM   **Pharmacist phone directory can now be found on [email or web address] ([email or web address]).  Listed under [place].

## 2019-09-30 LAB — RENAL FUNCTION PANEL
Albumin: 1.9 g/dL — ABNORMAL LOW (ref 3.5–5.0)
Anion gap: 12 (ref 5–15)
BUN: 78 mg/dL — ABNORMAL HIGH (ref 8–23)
CO2: 20 mmol/L — ABNORMAL LOW (ref 22–32)
Calcium: 7.6 mg/dL — ABNORMAL LOW (ref 8.9–10.3)
Chloride: 116 mmol/L — ABNORMAL HIGH (ref 98–111)
Creatinine, Ser: 5.03 mg/dL — ABNORMAL HIGH (ref 0.61–1.24)
GFR calc Af Amer: 13 mL/min — ABNORMAL LOW (ref >60)
GFR calc non Af Amer: 11 mL/min — ABNORMAL LOW (ref >60)
Glucose, Bld: 147 mg/dL — ABNORMAL HIGH (ref 70–99)
Phosphorus: 3.7 mg/dL (ref 2.5–4.6)
Potassium: 4.8 mmol/L (ref 3.5–5.1)
Sodium: 148 mmol/L — ABNORMAL HIGH (ref 135–145)

## 2019-09-30 LAB — CBC
HCT: 21.9 % — ABNORMAL LOW (ref 39.0–52.0)
Hemoglobin: 6.6 g/dL — CL (ref 13.0–17.0)
MCH: 30.6 pg (ref 26.0–34.0)
MCHC: 30.1 g/dL (ref 30.0–36.0)
MCV: 101.4 fL — ABNORMAL HIGH (ref 80.0–100.0)
Platelets: 144 10*3/uL — ABNORMAL LOW (ref 150–400)
RBC: 2.16 MIL/uL — ABNORMAL LOW (ref 4.22–5.81)
RDW: 15.9 % — ABNORMAL HIGH (ref 11.5–15.5)
WBC: 6.6 10*3/uL (ref 4.0–10.5)
nRBC: 0 % (ref 0.0–0.2)

## 2019-09-30 LAB — APTT: aPTT: 61 seconds — ABNORMAL HIGH (ref 24–36)

## 2019-09-30 LAB — GLUCOSE, CAPILLARY
Glucose-Capillary: 128 mg/dL — ABNORMAL HIGH (ref 70–99)
Glucose-Capillary: 186 mg/dL — ABNORMAL HIGH (ref 70–99)
Glucose-Capillary: 127 mg/dL — ABNORMAL HIGH (ref 70–99)
Glucose-Capillary: 146 mg/dL — ABNORMAL HIGH (ref 70–99)

## 2019-09-30 LAB — HEMOGLOBIN AND HEMATOCRIT, BLOOD
HCT: 29.6 % — ABNORMAL LOW (ref 39.0–52.0)
Hemoglobin: 9.5 g/dL — ABNORMAL LOW (ref 13.0–17.0)

## 2019-09-30 LAB — PREPARE RBC (CROSSMATCH)

## 2019-09-30 MED ORDER — SODIUM CHLORIDE 0.9% IV SOLUTION: Freq: Once | INTRAVENOUS | Status: DC

## 2019-09-30 NOTE — Progress Notes (Signed)
CRITICAL VALUE ALERT  Critical Value: Hgb 6.6 g/dL  Date & Time Notied: 09/30/19 @ 5:10am  Provider Notified: NP Sharlet Salina  Orders Received/Actions taken: Yes

## 2019-09-30 NOTE — Progress Notes (Signed)
[facility] Progress Note    Assessment/Plan: **CKD 5:  Renal function relatively stable but noted MAP did hit 50 during OR 1/5, risk for some tubular injury but per labs and UOP in past 24h renal function stable.  UOP good yesterday.  Check labs in AM again tomorrow.   **Hyperkalemia: expect just K load with operative tissue damage + CKD 5.  Lokelma 10 yesterday, resolved.  Use lokelma today to manage K in light of blood transfusion then can go to PRN.   **Anemia: primary plans transfusion once K controlled. Iron replete 1 week ago. On ESA, dose increased.  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 Was seen earlier in admission by [facility] and s/o -- 60M DM, HTN, vascular disease with CKD 4-5 with Cr ~5 at baseline.  Followed by Dr. Joelyn Villa who signed off 09/21/19.   Yesterday underwent operative intervention to R humeral fracture.  This AM Cr up from 4.8 to 5.1. K 6 and he received lokelma.  UOP yesterday was 1700.  Hb down from mid 8s to 7.2 this AM.    Subjective:  He has no complaints currently.  2u pRBC this AM for Hb 6.6.   Objective Vitals:   09/29/19 0900 09/29/19 1622 09/29/19 2033 09/30/19 0431  BP: (!) 134/54 (!) 129/59 (!) 128/46 (!) 143/46  Pulse: 65 69 75 75  Resp: _0 Temp: 98 F (36.7 C) 98.3 F (36.8 C) 98.4 F (36.9 C) 97.6 F (36.4 C)  TempSrc: Oral Oral    SpO2: 98% 100% 95%   Weight:      Height:       Physical Exam General: sitting in bedside chair Heart: no rub Lungs: normal WOB, clear ant and laterally Abdomen: soft, thin Extremities: no edema Neuro: nonfocal. No asterixis  Additional Objective Labs: Basic Metabolic Panel: Recent Labs  Lab 09/28/19 0559 09/29/19 0332 09/29/19 1255  09/29/19 1816 09/30/19 0405  NA 149* 147* 145  --  148*  K 5.3* 6.0* 5.6* 5.2* 4.8  CL 118* 116* 119*  --  116*  CO2 20* 21* 15*  --  20*  GLUCOSE 111* 229* 190*  --  147*  BUN 76* 76* 75*  --  78*  CREATININE 4.84* 5.07* 5.23*  --  5.03*  CALCIUM 7.9* 7.7* 7.7*  --  7.6*  PHOS 4.5 3.9  --   --  3.7   Liver Function Tests: Recent Labs  Lab 09/28/19 0559 09/29/19 0332 09/30/19 0405  ALBUMIN 2.0* 2.1* 1.9*   No results for input(s): LIPASE, AMYLASE in the last 168 hours. CBC: Recent Labs  Lab 09/26/19 0730 09/27/19 0351 09/28/19 0559 09/29/19 0332 09/30/19 0405  WBC 10.6* 9.2 6.4 9.7 6.6  NEUTROABS  --   --   --  8.0*  --   HGB 8.3* 8.5* 8.2* 7.2* 6.6*  HCT 26.8* 27.6* 27.1* 24.0* 21.9*  MCV 99.3 101.1* 101.1* 101.3* 101.4*  PLT 130* 164 174 160 144*   Blood Culture    Component Value Date/Time   SDES BLOOD LEFT HAND 09/15/2019 1817   SPECREQUEST  09/15/2019 1817    BOTTLES DRAWN AEROBIC AND ANAEROBIC Blood Culture results may not be optimal due to an inadequate volume of blood received in culture bottles   CULT  09/15/2019 1817    NO GROWTH  5 DAYS Performed at [facility], [place] [address], [place], [place] [address]    REPTSTATUS 09/20/2019 FINAL 09/15/2019 1817    Cardiac Enzymes: No results for input(s): CKTOTAL, CKMB, CKMBINDEX, TROPONINI in the last 168 hours. CBG: Recent Labs  Lab 09/29/19 0658 09/29/19 1119 09/29/19 1600 09/29/19 2032 09/30/19 0652  GLUCAP 156* 200* 184* 168* 128*   Iron Studies: No results for input(s): IRON, TIBC, TRANSFERRIN, FERRITIN in the last 72 hours. _0 @ Studies/Results: DG Shoulder Right Port  Result Date: 09/28/2019 CLINICAL DATA:  Right shoulder arthroplasty. EXAM: PORTABLE RIGHT SHOULDER COMPARISON:  Chest x-ray 09/27/2019. FINDINGS: Total right shoulder replacement. Fracture fragments noted about the proximal humerus. Hardware intact. Anatomic alignment. IMPRESSION: Total right shoulder replacement  anatomic alignment. Fracture fragments noted about the proximal humerus. Electronically Signed   By: Ryan Villa  Register   On: 09/28/2019 15:37   Medications: . argatroban 1.6 mcg/kg/min (09/30/19 0012)   . sodium chloride   Intravenous Once  . amLODipine  10 mg Per Tube Daily  . chlorhexidine gluconate (MEDLINE KIT)  15 mL Mouth Rinse BID  . Chlorhexidine Gluconate Cloth  6 each Topical Daily  . darbepoetin (ARANESP) injection - NON-DIALYSIS  150 mcg Subcutaneous Q Thu-1800  . docusate sodium  100 mg Oral BID  . feeding supplement (NEPRO CARB STEADY)  237 mL Oral TID BM  . insulin aspart  0-5 Units Subcutaneous QHS  . insulin aspart  0-9 Units Subcutaneous TID WC  . mupirocin ointment   Nasal BID  . pantoprazole  40 mg Oral Q1200  . sodium zirconium cyclosilicate  10 g Oral BID  . tranexamic acid (CYKLOKAPRON) topical -INTRAOP  2,000 mg Topical Once

## 2019-09-30 NOTE — Progress Notes (Signed)
PT Cancellation Note  Patient Details Name: Ryan Villa MRN: [id number] DOB: 1955-10-01   Cancelled Treatment:    Reason Eval/Treat Not Completed: Medical issues which prohibited therapy. Hgb 6.6. Pt scheduled to receive 2 units PRBCs. PT to re-attempt as time allows.   Lorriane Shire 09/30/2019, 8:14 AM   Lorrin Goodell, PT  Office # [phone] Pager [phone]

## 2019-09-30 NOTE — Progress Notes (Addendum)
Patient ID: Ryan Villa, male   DOB: 05-10-1956, 64 y.o.   MRN: [id number]  PROGRESS NOTE    Ryan Villa  STM:[id number] DOB: Apr 21, 1956 DOA: 09/15/2019 PCP: Maryella Shivers, MD   Brief Narrative:  63/M with history of chronic renal disease stage IV, diabetes mellitus type 2, hyperlipidemia, hypertension, left carotid stenosis, intracerebral hemorrhage, status post right BKA presented to [facility] ER after a fall at home 3 days prior to presentation.  He was found to have hypoxia requiring nonrebreather and significant ecchymosis over the right shoulder area.  He subsequently became more hypoxic and required intubation.  Creatinine was 10.5.  CK 2411.   He was found to have right humeral neck fracture, right upper lobe consolidation per chest x-ray, - He was admitted under PCCM service, to ICU, with respiratory failure from aspiration pneumonia, started on broad-spectrum antibiotics. Nephrology and orthopedics were consulted.  He subsequently self extubated at night on 09/17/2019.   He was transferred to [facility] service on 09/19/2019. [facility] course complicated by fluctuating kidney function, recurrent hyperkalemia, anemia -Underwent right shoulder reverse arthroplasty on 1/5 1/7; Hb down to 6.6- >2 units PRBC ordered  Subjective: -Hemoglobin down to 6.6 this morning, patient denies any overt bleeding, is a very poor historian -He complains of pain everywhere -No overnight events reported by staff RN  Assessment & Plan:  Acute hypoxic respiratory failure, likely secondary to aspiration pneumonia -Resolved, Intubated on admission and self extubated on 12/20 overnight. Transferred to [facility] service on 09/19/2019. PCCM signed off. -Stable from the standpoint -Diet as per SLP recommendations - ongoing concern about increased aspiration risk; NG removed on 09/25/2019, continue dysphagia 1 diet/renal  Aspiration pneumonia -Covid test was negative at [facility] -Blood cultures negative, received  7 days of antibiotics, improved and stable, continue dysphagia diet  HIT, confirmed -Ab positive; SRA positive -Continues on argatroban per pharmacy -VQ scan negative, could not perform CTA due to significantly abnormal creatinine -Continue argatroban today, may need to hold this temporarily if hemoglobin continues to trend down, plan eventually is to transition to oral Eliquis for a minimum of 4 weeks, platelet count had normalized    E. coli UTI, resolved (POA). -Has completed treatment with Rocephin as above  Displaced right humerus surgical neck  Fracture - Orthopedics following.   -Underwent reverse right shoulder arthroplasty 1/5  AKI on probable CKD stage IV  - Creatinine was around 4 in 2018 in care everywhere - Creatinine 9.68 at admission, felt to be from combination of dehydration and rhabdomyolysis, improved with hydration down to mid 4 range which is felt to be his baseline, nephrology signed off -Urine output continues to be good, potassium trended up to 6.0 yesterday, improving with Lokelma, appreciate Renal FU -Followed by nephrology in [facility] -He is on a dysphagia 1 diet,/add renal restrictions  Hyperkalemia -As above  Hypernatremia -Resolved  Anemia of chronic disease -Received 1 unit of PRBC on 12/20, -Hemoglobin down to 6.6 today likely postop blood loss and hemodilution -Transfused 2 units of PRBC, also has anemia of chronic disease -Continue iron and EPO  Hypertension  - Stable. Continue Norvasc  Acute metabolic encephalopathy/delirium, resolved - Initial CT head with no acute findings, multiple remote lacunar strokes noted - Patient alert and oriented x3 now - Mental status waxing/waning but generally continues to improve daily  History of CVA  - Currently on argatroban, hold Plavix for next 4 weeks while on anticoagulation, after this course is completed Plavix can be resumed -Resume statin  Generalized  conditioning -Overall prognosis is  guarded to poor  DVT prophylaxis: now on argatraban Code Status: Full Family Communication: No family at bedside Disposition Plan: Will likely need SNF pending improvement in kidney function, stabilization of potassium and hemoglobin Consultants: PCCM/nephrology/orthopedics Procedures: Intubation on presentation and self extubation on 09/17/2019 Right IJ central line placement on 09/15/2019  Antimicrobials:  Anti-infectives (From admission, onward)   Start     Dose/Rate Route Frequency Ordered Stop   09/28/19 2030  ceFAZolin (ANCEF) IVPB 1 g/50 mL premix     1 g 100 mL/hr over 30 Minutes Intravenous Every 12 hours 09/28/19 1624 09/29/19 1910   09/28/19 1157  vancomycin (VANCOCIN) powder  Status:  Discontinued       As needed 09/28/19 1158 09/28/19 1457   09/28/19 0600  vancomycin (VANCOCIN) IVPB 1000 mg/200 mL premix  Status:  Discontinued     1,000 mg 200 mL/hr over 60 Minutes Intravenous On call to O.R. 09/27/19 1159 09/28/19 1607   09/28/19 0000  ceFAZolin (ANCEF) IVPB 2g/100 mL premix    Note to Pharmacy: Surgery delayed until 1/5.   2 g 200 mL/hr over 30 Minutes Intravenous To Surgery 09/22/19 0904 09/28/19 1222   09/22/19 0815  ceFAZolin (ANCEF) IVPB 2g/100 mL premix  Status:  Discontinued     2 g 200 mL/hr over 30 Minutes Intravenous To Surgery 09/22/19 0806 09/22/19 0905   09/15/19 1715  azithromycin (ZITHROMAX) 500 mg in sodium chloride 0.9 % 250 mL IVPB  Status:  Discontinued     500 mg 250 mL/hr over 60 Minutes Intravenous Every 24 hours 09/15/19 1711 09/18/19 1107   09/15/19 1715  cefTRIAXone (ROCEPHIN) 1 g in sodium chloride 0.9 % 100 mL IVPB     1 g 200 mL/hr over 30 Minutes Intravenous Every 24 hours 09/15/19 1711 09/21/19 1754      Objective: Vitals:   09/30/19 0931 09/30/19 0953 09/30/19 1015 09/30/19 1055  BP: (!) 106/37 120/60 120/60 120/64  Pulse: 81  81 72  Resp: (!) '24  20 20  ' Temp: 99 F (37.2 C)  99.6 F (37.6 C) 99.4 F (37.4 C)  TempSrc: Oral   Axillary Axillary  SpO2: 90% 93% 94% 92%  Weight:      Height:        Intake/Output Summary (Last 24 hours) at 09/30/2019 1436 Last data filed at 09/30/2019 1300 Gross per 24 hour  Intake 860.28 ml  Output 900 ml  Net -39.72 ml   Filed Weights   09/26/19 2121 09/28/19 1137 09/28/19 2052  Weight: 65.4 kg 65.4 kg 63.8 kg   Examination: Gen: Chronically ill appearing male, looks much older than stated age, AAO x2, mild cognitive deficits noted, no distress HEENT: PERRLA, Neck supple, no JVD Lungs: Diminished breath sounds at both bases CVS: RRR,No Gallops,Rubs or new Murmurs Abd: soft, Non tender, non distended, BS present Extremities: Right BKA, right shoulder with dressing in a sling Skin: no new rashes  Data Reviewed: I have personally reviewed following labs and imaging studies  CBC: Recent Labs  Lab 09/26/19 0730 09/27/19 0351 09/28/19 0559 09/29/19 0332 09/30/19 0405  WBC 10.6* 9.2 6.4 9.7 6.6  NEUTROABS  --   --   --  8.0*  --   HGB 8.3* 8.5* 8.2* 7.2* 6.6*  HCT 26.8* 27.6* 27.1* 24.0* 21.9*  MCV 99.3 101.1* 101.1* 101.3* 101.4*  PLT 130* 164 174 160 725*   Basic Metabolic Panel: Recent Labs  Lab 09/26/19 0730 09/27/19 0351 09/28/19 0559 09/29/19  5830 09/29/19 1255 09/29/19 1816 09/30/19 0405  NA 147* 146* 149* 147* 145  --  148*  K 5.0 5.2* 5.3* 6.0* 5.6* 5.2* 4.8  CL 112* 112* 118* 116* 119*  --  116*  CO2 24 22 20* 21* 15*  --  20*  GLUCOSE 144* 125* 111* 229* 190*  --  147*  BUN 77* 79* 76* 76* 75*  --  78*  CREATININE 4.68* 4.90* 4.84* 5.07* 5.23*  --  5.03*  CALCIUM 7.8* 7.8* 7.9* 7.7* 7.7*  --  7.6*  PHOS 4.5 4.7* 4.5 3.9  --   --  3.7   GFR: Estimated Creatinine Clearance: 13.6 mL/min (A) (by C-G formula based on SCr of 5.03 mg/dL (H)). Liver Function Tests: Recent Labs  Lab 09/26/19 0730 09/27/19 0351 09/28/19 0559 09/29/19 0332 09/30/19 0405  ALBUMIN 1.9* 2.0* 2.0* 2.1* 1.9*   No results for input(s): LIPASE, AMYLASE in the last 168  hours. No results for input(s): AMMONIA in the last 168 hours. Coagulation Profile: Recent Labs  Lab 09/23/19 1902  INR 0.9   Cardiac Enzymes: No results for input(s): CKTOTAL, CKMB, CKMBINDEX, TROPONINI in the last 168 hours. CBG: Recent Labs  Lab 09/29/19 1119 09/29/19 1600 09/29/19 2032 09/30/19 0652 09/30/19 1154  GLUCAP 200* 184* 168* 128* 186*    Recent Results (from the past 240 hour(s))  SARS CORONAVIRUS 2 (TAT 6-24 HRS) Nasopharyngeal Nasopharyngeal Swab     Status: None   Collection Time: 09/20/19  5:01 PM   Specimen: Nasopharyngeal Swab  Result Value Ref Range Status   SARS Coronavirus 2 NEGATIVE NEGATIVE Final    Comment: (NOTE) SARS-CoV-2 target nucleic acids are NOT DETECTED. The SARS-CoV-2 RNA is generally detectable in upper and lower respiratory specimens during the acute phase of infection. Negative results do not preclude SARS-CoV-2 infection, do not rule out co-infections with other pathogens, and should not be used as the sole basis for treatment or other patient management decisions. Negative results must be combined with clinical observations, patient history, and epidemiological information. The expected result is Negative. Fact Sheet for Patients: SugarRoll.be Fact Sheet for Healthcare Providers: https://www.woods-mathews.com/ This test is not yet approved or cleared by the [place] FDA and  has been authorized for detection and/or diagnosis of SARS-CoV-2 by FDA under an Emergency Use Authorization (EUA). This EUA will remain  in effect (meaning this test can be used) for the duration of the COVID-19 declaration under Section 56 4(b)(1) of the Act, 21 U.S.C. section 360bbb-3(b)(1), unless the authorization is terminated or revoked sooner. Performed at [facility], [place] [address], [place], [place] [address]   Surgical pcr screen     Status: Abnormal   Collection Time: 09/21/19  3:02 PM    Specimen: Nasal Mucosa; Nasal Swab  Result Value Ref Range Status   MRSA, PCR POSITIVE (A) NEGATIVE Final   Staphylococcus aureus POSITIVE (A) NEGATIVE Final    Comment: (NOTE) The Xpert SA Assay (FDA approved for NASAL specimens in patients 53 years of age and older), is one component of a comprehensive surveillance program. It is not intended to diagnose infection nor to guide or monitor treatment. Performed at [facility], [place] [address], [place], [place] [address]   Surgical pcr screen     Status: Abnormal   Collection Time: 09/28/19  6:36 AM   Specimen: Nasal Mucosa; Nasal Swab  Result Value Ref Range Status   MRSA, PCR POSITIVE (A) NEGATIVE Final    Comment: RESULT  CALLED TO, READ BACK BY AND VERIFIED WITH: Tawni Levy RN 9:00 09/28/19 ([email or web address])    Staphylococcus aureus POSITIVE (A) NEGATIVE Final    Comment: (NOTE) The Xpert SA Assay (FDA approved for NASAL specimens in patients 3 years of age and older), is one component of a comprehensive surveillance program. It is not intended to diagnose infection nor to guide or monitor treatment. Performed at [facility], [place] [address] [place], [place] [address]     Radiology Studies: DG Shoulder Right Port  Result Date: 09/28/2019 CLINICAL DATA:  Right shoulder arthroplasty. EXAM: PORTABLE RIGHT SHOULDER COMPARISON:  Chest x-ray 09/27/2019. FINDINGS: Total right shoulder replacement. Fracture fragments noted about the proximal humerus. Hardware intact. Anatomic alignment. IMPRESSION: Total right shoulder replacement anatomic alignment. Fracture fragments noted about the proximal humerus. Electronically Signed   By: [place]   On: 09/28/2019 15:37    Scheduled Meds: . sodium chloride   Intravenous Once  . amLODipine  10 mg Per Tube Daily  . chlorhexidine gluconate (MEDLINE KIT)  15 mL Mouth Rinse BID  . Chlorhexidine Gluconate Cloth  6 each Topical Daily  . darbepoetin (ARANESP) injection - NON-DIALYSIS   150 mcg Subcutaneous Q Thu-1800  . docusate sodium  100 mg Oral BID  . feeding supplement (NEPRO CARB STEADY)  237 mL Oral TID BM  . insulin aspart  0-5 Units Subcutaneous QHS  . insulin aspart  0-9 Units Subcutaneous TID WC  . mupirocin ointment   Nasal BID  . pantoprazole  40 mg Oral Q1200  . sodium zirconium cyclosilicate  10 g Oral BID   Continuous Infusions: . argatroban 1.6 mcg/kg/min (09/30/19 0820)   Time spent: 41mn  PDomenic Polite MD [facility] 09/30/2019, 2:36 PM

## 2019-09-30 NOTE — Progress Notes (Signed)
[place] for Argatroban Indication: r/o HIT, r/o PE  Allergies  Allergen Reactions  . 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)      Patient Measurements: Height: 5\' 8"  (172.7 cm) Weight: 140 lb 10.5 oz (63.8 kg) IBW/kg (Calculated) : 68.4  Vital Signs: Temp: 97.6 F (36.4 C) (01/07 0431) BP: 143/46 (01/07 0431) Pulse Rate: 75 (01/07 0431)  Labs: Recent Labs    09/28/19 0559 09/29/19 0332 09/29/19 1255 09/29/19 1816 09/30/19 0405  HGB 8.2* 7.2*  --   --  6.6*  HCT 27.1* 24.0*  --   --  21.9*  PLT 174 160  --   --  144*  APTT 55* 54* 49* 61* 61*  CREATININE 4.84* 5.07* 5.23*  --  5.03*    Estimated Creatinine Clearance: 13.6 mL/min (A) (by C-G formula based on SCr of 5.03 mg/dL (H)).   Medical History: History reviewed. No pertinent past medical history.  Assessment: 21 YOM on heparin SQ for VTE prophylaxis with a >50% drop in platelets on day 7 of therapy concerning for HITT. Heparin was stopped on 12/30 and HITT testing was sent.   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 Noted RUE edema with dopplers negative for DVT, VQ scan on 1/4 also negative for PE.   The patient remains on argatroban due to thrombosis risk with HIT. The guidelines are unclear on the intended length of therapy with the patient does not have a confirmed thrombosis but could consider continuing on anticoagulation for 4 weeks. The patient could eventually transition to oral anticoagulation.   Argatroban was stopped on 1/5 AM for ortho surgery and resumed at midnight that night.   An aPTT this morning remains therapeutic after a rate increase yesterday (aPTT 61, goal of 50-90). Hgb has dropped to 6.6 today - likely still related to post-op bleeding. No active bleeding noted at this time. The patient is scheduled to receive 2 units of PRBC this AM. Plts are down slightly to 144.   Goal of  Therapy:  aPTT 50-90 seconds Monitor platelets by anticoagulation protocol: Yes   Plan:  - Continue Argatroban slightly to 1.6 mcg/kg/min - MD planning to transition to po AC when CBC stable - Will continue to monitor for any signs/symptoms of bleeding and will follow up with aPTT in the a.m.   Thank you for allowing pharmacy to be a part of this patient's care.  Alycia Rossetti, PharmD, BCPS Clinical Pharmacist Clinical phone for 09/30/2019: [phone] 09/30/2019 8:58 AM   **Pharmacist phone directory can now be found on [email or web address] ([email or web address]).  Listed under [place].

## 2019-10-01 ENCOUNTER — Inpatient Hospital Stay (HOSPITAL_COMMUNITY): Payer: Medicare Other

## 2019-10-01 DIAGNOSIS — T148XXA Other injury of unspecified body region, initial encounter: Secondary | ICD-10-CM

## 2019-10-01 DIAGNOSIS — T17908A Unspecified foreign body in respiratory tract, part unspecified causing other injury, initial encounter: Secondary | ICD-10-CM

## 2019-10-01 DIAGNOSIS — S42201A Unspecified fracture of upper end of right humerus, initial encounter for closed fracture: Secondary | ICD-10-CM

## 2019-10-01 LAB — POCT I-STAT 7, (LYTES, BLD GAS, ICA,H+H)
Acid-Base Excess: 1 mmol/L (ref 0.0–2.0)
Bicarbonate: 24.5 mmol/L (ref 20.0–28.0)
Calcium, Ion: 1.07 mmol/L — ABNORMAL LOW (ref 1.15–1.40)
HCT: 26 % — ABNORMAL LOW (ref 39.0–52.0)
Hemoglobin: 8.8 g/dL — ABNORMAL LOW (ref 13.0–17.0)
O2 Saturation: 100 %
Patient temperature: 100.6
Potassium: 4.5 mmol/L (ref 3.5–5.1)
Sodium: 151 mmol/L — ABNORMAL HIGH (ref 135–145)
TCO2: 26 mmol/L (ref 22–32)
pCO2 arterial: 37.3 mmHg (ref 32.0–48.0)
pH, Arterial: 7.43 (ref 7.350–7.450)
pO2, Arterial: 239 mmHg — ABNORMAL HIGH (ref 83.0–108.0)

## 2019-10-01 LAB — CBC
HCT: 34.2 % — ABNORMAL LOW (ref 39.0–52.0)
Hemoglobin: 10.3 g/dL — ABNORMAL LOW (ref 13.0–17.0)
MCH: 30.2 pg (ref 26.0–34.0)
MCHC: 30.1 g/dL (ref 30.0–36.0)
MCV: 100.3 fL — ABNORMAL HIGH (ref 80.0–100.0)
Platelets: 125 10*3/uL — ABNORMAL LOW (ref 150–400)
RBC: 3.41 MIL/uL — ABNORMAL LOW (ref 4.22–5.81)
RDW: 15.8 % — ABNORMAL HIGH (ref 11.5–15.5)
WBC: 2.9 10*3/uL — ABNORMAL LOW (ref 4.0–10.5)
nRBC: 0 % (ref 0.0–0.2)

## 2019-10-01 LAB — BLOOD GAS, ARTERIAL
Acid-base deficit: 2.9 mmol/L — ABNORMAL HIGH (ref 0.0–2.0)
Bicarbonate: 21.1 mmol/L (ref 20.0–28.0)
Drawn by: 330991
FIO2: 100
O2 Saturation: 98.5 %
Patient temperature: 38.9
pCO2 arterial: 38.1 mmHg (ref 32.0–48.0)
pH, Arterial: 7.372 (ref 7.350–7.450)
pO2, Arterial: 136 mmHg — ABNORMAL HIGH (ref 83.0–108.0)

## 2019-10-01 LAB — RENAL FUNCTION PANEL
Albumin: 2 g/dL — ABNORMAL LOW (ref 3.5–5.0)
Anion gap: 13 (ref 5–15)
BUN: 79 mg/dL — ABNORMAL HIGH (ref 8–23)
CO2: 21 mmol/L — ABNORMAL LOW (ref 22–32)
Calcium: 7.5 mg/dL — ABNORMAL LOW (ref 8.9–10.3)
Chloride: 116 mmol/L — ABNORMAL HIGH (ref 98–111)
Creatinine, Ser: 5.97 mg/dL — ABNORMAL HIGH (ref 0.61–1.24)
GFR calc Af Amer: 11 mL/min — ABNORMAL LOW (ref >60)
GFR calc non Af Amer: 9 mL/min — ABNORMAL LOW (ref >60)
Glucose, Bld: 174 mg/dL — ABNORMAL HIGH (ref 70–99)
Phosphorus: 4.6 mg/dL (ref 2.5–4.6)
Potassium: 4.6 mmol/L (ref 3.5–5.1)
Sodium: 150 mmol/L — ABNORMAL HIGH (ref 135–145)

## 2019-10-01 LAB — APTT: aPTT: 59 seconds — ABNORMAL HIGH (ref 24–36)

## 2019-10-01 LAB — GLUCOSE, CAPILLARY
Glucose-Capillary: 158 mg/dL — ABNORMAL HIGH (ref 70–99)
Glucose-Capillary: 119 mg/dL — ABNORMAL HIGH (ref 70–99)
Glucose-Capillary: 170 mg/dL — ABNORMAL HIGH (ref 70–99)
Glucose-Capillary: 121 mg/dL — ABNORMAL HIGH (ref 70–99)
Glucose-Capillary: 97 mg/dL (ref 70–99)

## 2019-10-01 MED ORDER — ETOMIDATE 2 MG/ML IV SOLN
20.0000 mg | Freq: Once | INTRAVENOUS | Status: AC
  Administered 2019-10-01: 20 mg via INTRAVENOUS

## 2019-10-01 MED ORDER — ROCURONIUM BROMIDE 50 MG/5ML IV SOLN
50.0000 mg | Freq: Once | INTRAVENOUS | Status: AC
  Administered 2019-10-01: 50 mg via INTRAVENOUS

## 2019-10-01 MED ORDER — DEXTROSE 5 % IV SOLN: INTRAVENOUS | Status: DC

## 2019-10-01 MED ORDER — PHENYLEPHRINE HCL-NACL 10-0.9 MG/250ML-% IV SOLN
INTRAVENOUS | Status: AC
  Filled 2019-10-01: qty 250

## 2019-10-01 MED ORDER — INSULIN ASPART 100 UNIT/ML ~~LOC~~ SOLN
0.0000 [IU] | SUBCUTANEOUS | Status: DC
  Administered 2019-10-01 (×2): 2 [IU] via SUBCUTANEOUS
  Administered 2019-10-01 – 2019-10-05 (×4): 1 [IU] via SUBCUTANEOUS
  Administered 2019-10-05: 2 [IU] via SUBCUTANEOUS
  Administered 2019-10-05 (×3): 1 [IU] via SUBCUTANEOUS
  Administered 2019-10-06: 3 [IU] via SUBCUTANEOUS
  Administered 2019-10-06 (×3): 2 [IU] via SUBCUTANEOUS
  Administered 2019-10-06: 1 [IU] via SUBCUTANEOUS
  Administered 2019-10-06: 2 [IU] via SUBCUTANEOUS
  Administered 2019-10-07 (×2): 1 [IU] via SUBCUTANEOUS
  Administered 2019-10-07: 2 [IU] via SUBCUTANEOUS
  Administered 2019-10-07 (×2): 1 [IU] via SUBCUTANEOUS
  Administered 2019-10-07 – 2019-10-08 (×2): 2 [IU] via SUBCUTANEOUS
  Administered 2019-10-08 (×2): 1 [IU] via SUBCUTANEOUS
  Administered 2019-10-08 (×3): 2 [IU] via SUBCUTANEOUS
  Administered 2019-10-09: 05:00:00 1 [IU] via SUBCUTANEOUS
  Administered 2019-10-09: 2 [IU] via SUBCUTANEOUS
  Administered 2019-10-09 – 2019-10-19 (×14): 1 [IU] via SUBCUTANEOUS
  Administered 2019-10-20: 21:00:00 2 [IU] via SUBCUTANEOUS
  Administered 2019-10-20: 13:00:00 1 [IU] via SUBCUTANEOUS
  Administered 2019-10-21: 2 [IU] via SUBCUTANEOUS

## 2019-10-01 MED ORDER — FENTANYL CITRATE (PF) 100 MCG/2ML IJ SOLN
50.0000 ug | INTRAMUSCULAR | Status: DC | PRN
  Administered 2019-10-03: 50 ug via INTRAVENOUS
  Filled 2019-10-01: qty 2

## 2019-10-01 MED ORDER — VITAL AF 1.2 CAL PO LIQD
1000.0000 mL | ORAL | Status: DC
  Administered 2019-10-01: 1000 mL

## 2019-10-01 MED ORDER — IPRATROPIUM-ALBUTEROL 0.5-2.5 (3) MG/3ML IN SOLN
3.0000 mL | RESPIRATORY_TRACT | Status: DC | PRN
  Filled 2019-10-01: qty 3

## 2019-10-01 MED ORDER — PANTOPRAZOLE SODIUM 40 MG IV SOLR
40.0000 mg | Freq: Every day | INTRAVENOUS | Status: DC
  Administered 2019-10-01: 40 mg via INTRAVENOUS
  Filled 2019-10-01: qty 40

## 2019-10-01 MED ORDER — MIDAZOLAM HCL 2 MG/2ML IJ SOLN: 2.0000 mg | INTRAMUSCULAR | Status: DC | PRN

## 2019-10-01 MED ORDER — FREE WATER
200.0000 mL | Freq: Three times a day (TID) | Status: DC
  Administered 2019-10-01 – 2019-10-02 (×2): 200 mL

## 2019-10-01 MED ORDER — FENTANYL CITRATE (PF) 100 MCG/2ML IJ SOLN
50.0000 ug | INTRAMUSCULAR | Status: AC | PRN
  Administered 2019-10-02 – 2019-10-03 (×3): 50 ug via INTRAVENOUS
  Filled 2019-10-01 (×3): qty 2

## 2019-10-01 MED ORDER — NOREPINEPHRINE 4 MG/250ML-% IV SOLN: 2.0000 ug/min | INTRAVENOUS | Status: DC

## 2019-10-01 MED ORDER — SODIUM CHLORIDE 0.9 % IV SOLN
250.0000 mL | INTRAVENOUS | Status: DC
  Administered 2019-10-04: 250 mL via INTRAVENOUS

## 2019-10-01 MED ORDER — ACETAMINOPHEN 650 MG RE SUPP
650.0000 mg | Freq: Once | RECTAL | Status: AC
  Administered 2019-10-01: 650 mg via RECTAL
  Filled 2019-10-01: qty 1

## 2019-10-01 MED ORDER — PHENYLEPHRINE 40 MCG/ML (10ML) SYRINGE FOR IV PUSH (FOR BLOOD PRESSURE SUPPORT)
400.0000 ug | PREFILLED_SYRINGE | Freq: Once | INTRAVENOUS | Status: AC
  Administered 2019-10-01: 200 ug via INTRAVENOUS

## 2019-10-01 MED ORDER — VITAL HIGH PROTEIN PO LIQD: 1000.0000 mL | ORAL | Status: DC

## 2019-10-01 MED ORDER — PROPOFOL 1000 MG/100ML IV EMUL
0.0000 ug/kg/min | INTRAVENOUS | Status: DC
  Administered 2019-10-01: 5 ug/kg/min via INTRAVENOUS
  Filled 2019-10-01: qty 100

## 2019-10-01 MED ORDER — SODIUM CHLORIDE 0.9 % IV SOLN
3.0000 g | Freq: Two times a day (BID) | INTRAVENOUS | Status: DC
  Administered 2019-10-01 – 2019-10-02 (×4): 3 g via INTRAVENOUS
  Filled 2019-10-01 (×4): qty 8
  Filled 2019-10-01 (×2): qty 3

## 2019-10-01 NOTE — Progress Notes (Signed)
0200 ; pt. Vomited large amount of brown emesis. Pt. Coughing and checked vital signs with O2  saturation at 92% on 2L [place]. Respiration on th 30"s. textpaged NP Bodenheimer made ware. Orders were given and carried out. @ 0400 pt vomited again with large amount of brown emesis and pt is visibly on respiratory distress O2 saturation at 89% on 3L, RR 38, Called Rapid Response for further evaluation and management on patient.

## 2019-10-01 NOTE — Progress Notes (Signed)
Pt. is to be transferred to 4N

## 2019-10-01 NOTE — Progress Notes (Signed)
PCCM Interval Progress Note  Vomited further. On arrival to ICU, less responsive and sats in low to mid 80s. Not protecting airway well.  Proceeded with intubation.  See intubation note.   [name], [place] [facility] 10/01/2019, 6:52 AM

## 2019-10-01 NOTE — Progress Notes (Signed)
Nutrition Follow-up  DOCUMENTATION CODES:   Severe malnutrition in context of acute illness/injury  INTERVENTION:   Initiate Vital AF 1.2 @ 20 ml/hr (480 ml/day) via NG tube  RECOMMEND Advance TF to goal rate 70 ml/hr  Provides: 2016 kcal, 126 grams protein, and 1362 ml free water.    NUTRITION DIAGNOSIS:   Severe Malnutrition related to acute illness(S/P fall at home with rhabdomyolysis after lying on the floor for 3 days) as evidenced by moderate fat depletion, moderate muscle depletion, severe muscle depletion, severe fat depletion.  Ongoing.  GOAL:   Patient will meet greater than or equal to 90% of their needs  Progressing.   MONITOR:   Vent status, TF tolerance  REASON FOR ASSESSMENT:   Consult, Ventilator Enteral/tube feeding initiation and management  ASSESSMENT:   64 yo male admitted with respiratory failure, rhabdomyolysis, and AKI after fall at home 3 days PTA. He had been lying on the floor x 3 days since his fall. Found to have PNA, UTI, and right proximal humerus fracture. PMH includes CKD-IV, HLD, DM-2, HTN, L carotid stenosis, ICH, R BKA.  12/25- extubated  12/28- failed MBS, gastric Cortrak placed 1/5- s/p R reverse shoulder arthroplasty   1/2- Cortrak removed, diet advanced DYS1/nectar thick liquids 1/8 vomited x 4 and aspirated, pt tx to ICU and intubated  Patient is currently intubated on ventilator support MV: 11 L/min Temp (24hrs), Avg:99.9 F (37.7 C), Min:98.3 F (36.8 C), Max:102.3 F (39.1 C)  Propofol: 2 ml/hr (5 mcg) provides: 52 kcal  Medications reviewed and include: colace, SSI, lokelma D5 @ 50  Labs reviewed: Na 150 (H), BUN/Cr: 79/5.97 (H)     Diet Order:   Diet Order            Diet NPO time specified  Diet effective now              EDUCATION NEEDS:   Not appropriate for education at this time  Skin:  Skin Assessment: Skin Integrity Issues: Skin Integrity Issues:: Other (Comment) Stage II: N/A Other: skin  tear x 2 to R forearm  Last BM:  1/8  Height:   Ht Readings from Last 1 Encounters:  10/01/19 5\' 8"  (1.727 m)    Weight:   Wt Readings from Last 1 Encounters:  10/01/19 66.2 kg    Ideal Body Weight:  65.5 kg(adjusted for BKA)  BMI:  Body mass index is 22.19 kg/m.  Estimated Nutritional Needs:   Kcal:  2037  Protein:  100-125 grams  Fluid:  >2 L/day  Maylon Peppers RD, LDN, CNSC [phone] Pager [phone] After Hours Pager

## 2019-10-01 NOTE — Significant Event (Signed)
Rapid Response Event Note  Overview: Called d/t tachypnea/respiratory distress after vomiting. Pt vomited around 2300 last night. NP notified and pt made NPO and  PCXR obtained d/t concern for aspiration. Pt vomited again around 0400 and SpO2 dropped to 80s on 4L [place], RR-40s Time Called: 0412 Arrival Time: 0415 Event Type: Respiratory  Initial Focused Assessment: Pt laying in bed with eyes closed. Pt will wake up and look around but will go back to sleep very easily. Pt will not follow commands but moves all extremities. T-102.3(O), HR-97, BP-130/42, 38, SpO2-90% on 4L [place]. Pt FiO2 increased to 6L [place] with SpO2 increasing to 94%, then falling to 88%. Pt then placed on NRB with SpO2 increasing to 96%. Lungs diminished with rhonchi. R more diminished than L. Skin hot to touch. Pt has weak, congested cough.   Pt vomited again while being assessed. Tried to wean NRB to HFNC 15 L d/t vomiting and SpO2 decreased to 85%.   Pt NTS'd x 2-thick brown  Pt continues to vomit>ngt inserted.   PCCM to bedside-pt more awake. Will tx to 4N for closer monitoring.   Interventions: NRB PCXR(obtained PTA RRT)>Right-greater-than-left basilar opacities, which may indicate aspiration or atelectasis. NTS'd X 2-thick brown  ABG ABD xray NGT inserted Plan of Care (if not transferred): PCCM consulted. Pt transferring to 4N. Event Summary: Name of Physician Notified: Bodenheimer, NP at (PTA RRT)    at          [place], Carren Rang

## 2019-10-01 NOTE — Progress Notes (Signed)
   Subjective:  Patient currently intubated with no acute events.    Objective:   VITALS:   Vitals:   10/01/19 2230 10/01/19 2245 10/01/19 2300 10/01/19 2315  BP: (!) 105/38 (!) 93/44 (!) 108/48 (!) 118/53  Pulse: 69 70 64 68  Resp: 19 18 18 19   Temp:      TempSrc:      SpO2: 99% 97% 99% 97%  Weight:      Height:        NAD, intubated RUE- incision is c/d/i, with dressing clean  3+ edema throughout 2 + radial pulse  Lab Results  Component Value Date   WBC 2.9 (L) 10/01/2019   HGB 10.3 (L) 10/01/2019   HCT 34.2 (L) 10/01/2019   MCV 100.3 (H) 10/01/2019   PLT 125 (L) 10/01/2019   BMET    Component Value Date/Time   NA 150 (H) 10/01/2019 0925   K 4.6 10/01/2019 0925   CL 116 (H) 10/01/2019 0925   CO2 21 (L) 10/01/2019 0925   GLUCOSE 174 (H) 10/01/2019 0925   BUN 79 (H) 10/01/2019 0925   CREATININE 5.97 (H) 10/01/2019 0925   CALCIUM 7.5 (L) 10/01/2019 0925   GFRNONAA 9 (L) 10/01/2019 0925   GFRAA 11 (L) 10/01/2019 0925     Assessment/Plan: 3 Days Post-Op   Principal Problem:   Closed fracture of right proximal humerus Active Problems:   Acute respiratory failure (HCC)   AKI (acute kidney injury) ([place])   Rhabdomyolysis   Acute renal failure (ARF) (HCC)   Pressure injury of skin   Protein-calorie malnutrition, severe   Encounter for feeding tube placement   Aspiration into airway   Fracture  - OK to WBAT to RUE with walker only, otherwise NWB and sling when able - prn dry dressings to right arm  Nicholes Stairs 10/01/2019, 11:26 PM   Geralynn Rile, MD [phone]

## 2019-10-01 NOTE — Progress Notes (Signed)
Paged by bedside RN regarding pt experiencing an episode of brown emesis x 2 that likely resulted in the pt aspirating. Had previously been intubated and treated for acute hypoxic respiratory failure due to secondary aspiration pneumonia. Initially pt was on 3L via [place] with respirations in the 20's-30's. Ordered a CXR and made the pt NPO. Pt then proceeded to experience another episode of large brown emesis and requiring a NRB to maintain O2 sat of 91%. Temp of 102.3 so 650mg  of rectal Tylenol ordered. Upon entering the room pt is tachypneic into the 50's. LS: rhonchus bilaterally.  O2 sat 90% on a NRB. NT suction performed with a small amount of thick brown secretions removed. NGT placed by rapid response nurse and ABD X-Ray obtained. ABG ordered and awaiting results. PCCM consulted and will come see the pt ASAP.  Arby Barrette APRN-C [facility] Pager [phone]

## 2019-10-01 NOTE — Progress Notes (Signed)
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)      Patient Measurements: Height: 5\' 8"  (172.7 cm) Weight: 145 lb 15.1 oz (66.2 kg) IBW/kg (Calculated) : 68.4  Vital Signs: Temp: 100.6 F (38.1 C) (01/08 0800) Temp Source: Axillary (01/08 0800) BP: 100/67 (01/08 0800) Pulse Rate: 93 (01/08 0800)  Labs: Recent Labs    09/29/19 0332 09/29/19 1255 09/29/19 1816 09/30/19 0405 09/30/19 2130 10/01/19 0849 10/01/19 0925  HGB 7.2*  --   --  6.6* 9.5* 8.8* 10.3*  HCT 24.0*  --   --  21.9* 29.6* 26.0* 34.2*  PLT 160  --   --  144*  --   --  125*  APTT 54* 49* 61* 61*  --   --  59*  CREATININE 5.07* 5.23*  --  5.03*  --   --   --     Estimated Creatinine Clearance: 14.1 mL/min (A) (by C-G formula based on SCr of 5.03 mg/dL (H)).   Medical History: History reviewed. No pertinent past medical history.  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 Heparin was stopped on 12/30 and HITT testing was sent.   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 Noted RUE edema with dopplers negative for DVT, VQ scan on 1/4 also negative for PE.   The patient remains on argatroban due to thrombosis risk with HIT. The guidelines are unclear on the intended length of therapy with the patient does not have a confirmed thrombosis but could consider continuing on anticoagulation for 4 weeks. The patient could eventually transition to oral anticoagulation.   His aPTT this morning remains therapeutic on rate of 1.6 mcg/kg/min (aPTT 59, goal of 50-90). Hgb up to 10.3 today. Plts are down to 125.   Goal of Therapy:  aPTT 50-90 seconds Monitor platelets by anticoagulation protocol: Yes   Plan:  - Continue Argatroban slightly to 1.6 mcg/kg/min - MD  planning to transition to po AC when CBC stable - Will continue to monitor for any signs/symptoms of bleeding and will follow up with aPTT in the a.m.    Thank you for allowing pharmacy to be a part of this patient's care.  Sloan Leiter, PharmD, BCPS, BCCCP Clinical Pharmacist Please refer to [facility] for [place] numbers 10/01/2019 10:20 AM

## 2019-10-01 NOTE — Progress Notes (Signed)
Pharmacy Antibiotic Note  Ryan Villa is a 64 y.o. male admitted on 09/15/2019 s/p fall with acute respiratory failure, rhabdomyolysis, acute kidney injury.  Pt was clinically improving until 1/8 PM when patient began vomiting multiple times.  Now with fever (Tm 102.3) and MD concerned for aspiration pna.  Pharmacy has been consulted for Unasyn dosing.  Noted CKD.  Will adjust abx dosing as appropriate.  Plan: Unasyn 3gm IV q12h.  Height: 5\' 8"  (172.7 cm) Weight: 145 lb 15.1 oz (66.2 kg) IBW/kg (Calculated) : 68.4  Temp (24hrs), Avg:99.8 F (37.7 C), Min:98.5 F (36.9 C), Max:102.3 F (39.1 C)  Recent Labs  Lab 09/26/19 0730 09/27/19 0351 09/28/19 0559 09/29/19 0332 09/29/19 1255 09/30/19 0405  WBC 10.6* 9.2 6.4 9.7  --  6.6  CREATININE 4.68* 4.90* 4.84* 5.07* 5.23* 5.03*    Estimated Creatinine Clearance: 14.1 mL/min (A) (by C-G formula based on SCr of 5.03 mg/dL (H)).    Allergies  Allergen Reactions  . 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  [employer], Pharm.D., BCPS Clinical Pharmacist  **Pharmacist phone directory can be found on [email or web address] listed under [place].  10/01/2019 6:39 AM

## 2019-10-01 NOTE — Progress Notes (Signed)
SLP Cancellation Note  Patient Details Name: Ryan Villa MRN: [id number] DOB: 1956/02/21   Cancelled treatment:       Reason Eval/Treat Not Completed: Medical issues which prohibited therapy - pt transferred to ICU, intubated. SLP to sign off for now - please reorder post-extubation as needed.     Osie Bond., M.A. [place] [facility] Pager [phone] Office [phone]  10/01/2019, 7:51 AM

## 2019-10-01 NOTE — Progress Notes (Signed)
NAME:  Ryan Villa, MRN:  [id number], DOB:  1956/08/24, LOS: 9 ADMISSION DATE:  09/15/2019, CONSULTATION DATE:  09/15/2019 REFERRING MD:  OSH CHIEF COMPLAINT: Respiratory failure, rhabdomyolysis, acute kidney injury  Brief History   64 year old male transferred from [facility] 12/23 for acute respiratory failure, rhabdomyolysis, acute kidney injury due to services unavailable at that facility.  Self extubated 12/25.  Taken to OR for right shoulder surgery 1/5.  Had vomiting with probable aspiration early AM 1/87, PCCM re-consulted.  History of present illness   This is a 64 year old male past medical history CKD stage IV, diabetes mellitus type 2, hyperlipidemia, hypertension, left carotid stenosis, intracerebral hemorrhage, status post right BKA presented to [facility] emergency department after apparently falling at his home 3 days ago, apparently lying on the floor since that time.  EMS reported that the patient's sister was bringing him food while he was lying on the floor during that 3-day period. He is status post recent right BKA and uses a prosthetic and is supposed to use a walker but is noncompliant with his per family.  Reportedly not using his walker when he fell and landed on his right side.  Unclear if there was loss of consciousness.  At that time the patient did not want his sister to call 911.  On 12/23 she called 911 because she noticed that he was having a hard time breathing and bruising on his right shoulder.  On arrival to [facility] ED the patient was able to answer questions with difficulty in understanding his speech.  He was hypoxic on room air and placed on nonrebreather, noted to appear severely ill appearing and tachypneic.  He was also noted to be covered in dried stool/urine.  He complained of right shoulder pain.  Work-up and findings significant for hypothermia with a temperature of 93.9 F, significant ecchymosis over the right shoulder area, hypotensive, given IV  fluids.  He progressed to have worsening respiratory distress, hypotension, became unresponsive and subsequently intubated, central line placed.  He had significant metabolic derangements including sodium 149, potassium 6.8, BUN 172, creatinine 10.5.  CK 2411. Procalcitonin 0.61.  UDS negative.  pH less than 7 per ABG report.  He responded fairly well to IV fluids and briefly required pressor support.  Repeat labs showed minimal improvement in metabolic derangements.  He had some urine output, milky white in color.  Noted right humeral neck fracture on x-ray per ER paper chart, right upper lobe consolidation per chest x-ray, head CT no acute intracranial process, Diffuse parasinus inflammation.   He self extubated 12/25.  Transferred out of ICU 12/27. Taken for right shoulder reverse arthroplasty 1/5.  Early AM 1/8 had vomiting with probable aspiration.  Placed on NRB and transferred to ICU for possible intubation.  Past Medical History  Diabetes mellitus type 2 CKD stage IV Hyperlipidemia Hypertension Left carotid gnosis Intracerebral hemorrhage Status post right [place] [facility] Events   12/23 admitted  12/25 self extubated 12/27 transferred out of ICU 1/2 NG removed and started on dysphagia 1 diet 1/5 right reverse shoulder arthroplasty 1/6 advanced to dysphagia 3 diet 1/8 vomited multiple times.  Presumed aspiration.  Transferred to ICU for possible intubation  Consults:  Nephrology Orthopedics  Procedures:  NA  Significant Diagnostic Tests:  CXR 1/8 > aspiration  Micro Data:  SARS Coronavirus 2 negative per [facility] paper chart  Antimicrobials:  Ceftriaxone 1 dose 12/23 OSH>> 1220 Azithromycin 1 dose 12/23 OSH>>12/26 Unasyn 1/8 >   Interim history/subjective:  Vomited multiple times.  Had NG placed and NTS'd with thick brown secretions removed. Febrile to 102.  Received 650mg  rectal tylenol. On NRB with RR in 48s to 14s. Does wake up to voice. ABG 7.37 /  38 / 136.  Objective   Blood pressure (!) 130/42, pulse 97, temperature (!) 102.3 F (39.1 C), temperature source Oral, resp. rate (!) 38, height 5\' 8"  (1.727 m), weight 63.8 kg, SpO2 92 %.        Intake/Output Summary (Last 24 hours) at 10/01/2019 0556 Last data filed at 10/01/2019 0000 Gross per 24 hour  Intake 686.77 ml  Output 1200 ml  Net -513.23 ml   Filed Weights   09/26/19 2121 09/28/19 1137 09/28/19 2052  Weight: 65.4 kg 65.4 kg 63.8 kg    Examination: General: Adult male, chronically ill appearing, in mild distress. Neuro: Sleepy but arouses to voice and follows basic commands. HEENT: [place]/AT. NG in place with minimal output. Cardiovascular: RRR, no M/R/G.  Lungs: Respirations shallow and rapid.  Course bases R > L. Abdomen: BS x 4, soft, NT/ND.  Musculoskeletal: R BKA. Nno edema.  Skin: Intact, warm, no rashes.   Assessment & Plan:   Acute hypoxic respiratory failure - presumed 2/2 aspiration after multiple vomiting episodes early AM 1/8.  Did receive 7 days abx (  - Transfer to ICU. - Continue supplemental O2 as needed to maintain SpO2 > 92%. - Might require intubation, hoping to avoid if able given his cachectic state.  Vomiting. - Zofran PRN.  Dysphagia - had NG removed 1/2 and advanced to dysphagia 3 diet 1/6.  Now with vomiting early AM 1/8 leading to aspiration. - Change to NPO. - Might need MBS once stabilized from respiratory standpoint.  CKD 5. Rhabdo - resolved. - Appreciate nephrology input. - Follow BMP.  Hyperkalemia post op - resolved. - Monitor.  Mild hypernatremia. - Gentle D5W.  Hyperglycemia. - Change SSI to q4hrs.  Anemia - partly expected post op.  Had 2u PRBC 1/7. - Transfuse for Hgb < 7.  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 Concern PE / DVT - VQ neg.  CTA not performed due to CKD5. - Continue argatroban per pharm. - Per TRH notes, plan was to transition to eliquis x 4 weeks. - Can consider LE or UE duplex.  Hx HTN. - D/c  norvasc.  E.coli UTI - s/p course ceftriaxone. - No further interventions required.  Right proximal humerus fracture - s/p reverse shoulder arthroplasty 1/5. - Post op care per ortho. - PT / OT.  Generalized failure to thrive. Severe malnutrition. Hx CVA. - PT / OT. - Will need to assess nutrition options once over acute issue (? MBS).  Best practice:  Diet: N.p.o. Pain/Anxiety/Delirium protocol (if indicated): N/A. VAP protocol (if indicated): N/A. DVT prophylaxis: Argatroban GI prophylaxis: None. Glucose control: Sliding scale insulin every 4 hours Mobility: Bedrest Code Status: Full Family Communication: day team to notify family of transfer. Disposition: Transfer to ICU.   [name], [place] [facility] 10/01/2019, 6:26 AM

## 2019-10-01 NOTE — Progress Notes (Signed)
PT Cancellation Note  Patient Details Name: Ryan Villa MRN: [id number] DOB: 05-15-1956   Cancelled Treatment:    Reason Eval/Treat Not Completed: Medical issues which prohibited therapy(Pt intubated this am.  Nurse asked to [place] today. )   Denice Paradise 10/01/2019, 9:45 AM Alex Mcmanigal W,PT [place] Pager:  [phone]  Office:  [phone]

## 2019-10-01 NOTE — Progress Notes (Signed)
[facility] Progress Note    Assessment/Plan: **CKD 5:  Renal function relatively stable but noted MAP did hit 50 during OR 1/5 and then with hypoxic event overnight Cr up to 6 today. UOP good yesterday, fair today.  No current indications for RRT but likely will develop in the coming days.   **Acute hypoxic resp failure: secondary to aspiration, intubated now; per PCCM.    **Hyperkalemia: expect just K load with operative tissue damage + CKD 5.  Lokelma 10 yesterday, resolved.  Use lokelma PRN for now.  **Hypernatremia:  PCCM started D5W gtt, follow.  **Anemia: improved following transfusion 1/7.  Iron replete 1 week ago. On ESA, dose increased on 1/7.  **HTN: normotensive to modestly hypotensive, no pressors.   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 1234m UOP yesterday, I see 409min bag at 2pm.  Objective Vitals:   10/01/19 0706 10/01/19 0800 10/01/19 0859 10/01/19 1206  BP:  100/67  (!) 100/55  Pulse:  93  86  Resp:  (!) 28  (!) 21  Temp:  (!) 100.6 F (38.1 C)  100.2 F (37.9 C)  TempSrc:  Axillary  Axillary  SpO2: 100% 100% 100% 96%  Weight:      Height:       Physical Exam General: intubated and sedated Heart: no rub Lungs: vent, clear ant and laterally Abdomen: soft, thin Extremities: no edema Neuro: nonfocal. No asterixis  Additional Objective Labs: Basic Metabolic Panel: Recent Labs  Lab 09/29/19 0332 09/29/19 1255 09/30/19 0405 10/01/19 0849 10/01/19 0925  NA 147* 145 148* 151* 150*  K 6.0* 5.6* 4.8 4.5 4.6  CL 116* 119* 116*  --  116*  CO2 21* 15* 20*  --  21*  GLUCOSE 229* 190* 147*  --  174*  BUN 76* 75* 78*  --  79*  CREATININE 5.07* 5.23* 5.03*  --  5.97*  CALCIUM 7.7* 7.7* 7.6*  --  7.5*  PHOS 3.9  --  3.7  --  4.6    Liver Function Tests: Recent Labs  Lab 09/29/19 0332 09/30/19 0405 10/01/19 0925  ALBUMIN 2.1* 1.9* 2.0*   No results for input(s): LIPASE, AMYLASE in the last 168 hours. CBC: Recent Labs  Lab 09/27/19 0351 09/28/19 0559 09/29/19 0332 09/30/19 0405 09/30/19 2130 10/01/19 0849 10/01/19 0925  WBC 9.2 6.4 9.7 6.6  --   --  2.9*  NEUTROABS  --   --  8.0*  --   --   --   --   HGB 8.5* 8.2* 7.2* 6.6* 9.5* 8.8* 10.3*  HCT 27.6* 27.1* 24.0* 21.9* 29.6* 26.0* 34.2*  MCV 101.1* 101.1* 101.3* 101.4*  --   --  100.3*  PLT 164 174 160 144*  --   --  125*   Blood Culture    Component Value Date/Time   SDES TRACHEAL ASPIRATE 10/01/2019 1210   SPECREQUEST NONE 10/01/2019 1210   CULT PENDING 10/01/2019 1210   REPTSTATUS PENDING 10/01/2019 1210    Cardiac Enzymes: No results for input(s): CKTOTAL, CKMB, CKMBINDEX, TROPONINI in the last 168 hours. CBG: Recent Labs  Lab 09/30/19 1154 09/30/19 1607 09/30/19 2300 10/01/19 0834 10/01/19 1204  GLUCAP 186* 127* 146* 158* 119*   Iron Studies: No results for input(s): IRON, TIBC, TRANSFERRIN, FERRITIN in the last 72 hours. '@[email or web address]' @ Studies/Results: DG CHEST PORT 1 VIEW  Result  Date: 10/01/2019 CLINICAL DATA:  Status post intubation today. EXAM: PORTABLE CHEST 1 VIEW COMPARISON:  Single-view of the chest 10/01/2019 at 2:53 a.m. FINDINGS: Endotracheal tube is now in place with the tip in good position 3 cm above the carina. New NG tube courses into the stomach and below the inferior margin of the film. Airspace disease in the right lung base has worsened. Mild airspace disease in the left lung base is unchanged. Heart size is normal. No pneumothorax or pleural effusion. IMPRESSION: Endotracheal tube is in good position. OG tube courses into the stomach and below the inferior margin the film. Worsened right lower lobe airspace disease which could be due to aspiration or pneumonia. Electronically Signed   By: Inge Rise M.D.   On:  10/01/2019 07:24   DG CHEST PORT 1 VIEW  Result Date: 10/01/2019 CLINICAL DATA:  Shortness of breath EXAM: PORTABLE CHEST 1 VIEW COMPARISON:  09/27/2019 FINDINGS: Status post right shoulder total arthroplasty. Increased opacity at the right lung base. No pleural effusion or pneumothorax. Normal cardiomediastinal contours. Mild left basilar opacities, likely atelectasis. IMPRESSION: Right-greater-than-left basilar opacities, which may indicate aspiration or atelectasis. Electronically Signed   By: Ulyses Jarred M.D.   On: 10/01/2019 03:11   DG Abd Portable 1V  Result Date: 10/01/2019 CLINICAL DATA:  Vomiting, fever and nausea EXAM: PORTABLE ABDOMEN - 1 VIEW COMPARISON:  Radiograph 09/22/2019 FINDINGS: A transesophageal tube tip is directed towards the gastric antrum/duodenal bulb with mild kinking. Side port is distal to the GE junction. Radiodense contrast material partially opacifies the colon. Few air-filled loops of bowel are seen in the mid abdomen. Patchy mixed interstitial airspace opacities are present throughout the lungs. No pneumothorax or effusion. Cardiomediastinal contours as included are unremarkable. Mild degenerative changes present in the spine. IMPRESSION: 1. A transesophageal tube tip is directed towards the gastric antrum/duodenal bulb with mild kinking. Side port is distal to the GE junction. 2. Radiodense contrast material partially opacifies the colon. Electronically Signed   By: Lovena Le M.D.   On: 10/01/2019 05:41   Medications: . sodium chloride    . ampicillin-sulbactam (UNASYN) IV 3 g (10/01/19 0939)  . argatroban 1.6 mcg/kg/min (10/01/19 0800)  . dextrose 50 mL/hr at 10/01/19 0800  . feeding supplement (VITAL AF 1.2 CAL) 1,000 mL (10/01/19 1218)  . propofol (DIPRIVAN) infusion 5 mcg/kg/min (10/01/19 0800)   . sodium chloride   Intravenous Once  . chlorhexidine gluconate (MEDLINE KIT)  15 mL Mouth Rinse BID  . Chlorhexidine Gluconate Cloth  6 each Topical Daily  .  darbepoetin (ARANESP) injection - NON-DIALYSIS  150 mcg Subcutaneous Q Thu-1800  . docusate sodium  100 mg Oral BID  . insulin aspart  0-9 Units Subcutaneous Q4H  . mupirocin ointment   Nasal BID  . pantoprazole (PROTONIX) IV  40 mg Intravenous Daily  . sodium zirconium cyclosilicate  10 g Oral BID

## 2019-10-01 NOTE — Procedures (Signed)
Intubation Procedure Note Ryan Villa [id number] [birth year]  Procedure: Intubation Indications: Respiratory insufficiency  Procedure Details Consent: Unable to obtain consent because of emergent medical necessity. Time Out: Verified patient identification, verified procedure, site/side was marked, verified correct patient position, special equipment/implants available, medications/allergies/relevent history reviewed, required imaging and test results available.  Performed  Drugs:  20 mg Etomidate, 50 mg Rocuronium. VL x 1 with # 7.5 blade. Grade 1 view. 7.5 tube visualized passing through vocal cords. Following intubation:  positive color change on ETCO2, condensation seen in endotracheal tube, equal breath sounds bilaterally.  Evaluation Hemodynamic Status: Transient hypotension 254mcg neosynephrine; O2 sats: stable throughout Patient's Current Condition: stable Complications: No apparent complications Patient did tolerate procedure well. Chest X-ray ordered to verify placement.  CXR: pending.   Ryan Villa, [place] [facility] Pager: [phone]  or [phone] 10/01/2019, 6:50 AM

## 2019-10-02 ENCOUNTER — Inpatient Hospital Stay (HOSPITAL_COMMUNITY): Payer: Medicare Other

## 2019-10-02 LAB — GLUCOSE, CAPILLARY
Glucose-Capillary: 109 mg/dL — ABNORMAL HIGH (ref 70–99)
Glucose-Capillary: 110 mg/dL — ABNORMAL HIGH (ref 70–99)
Glucose-Capillary: 111 mg/dL — ABNORMAL HIGH (ref 70–99)
Glucose-Capillary: 94 mg/dL (ref 70–99)
Glucose-Capillary: 80 mg/dL (ref 70–99)
Glucose-Capillary: 84 mg/dL (ref 70–99)

## 2019-10-02 LAB — TRIGLYCERIDES: Triglycerides: 81 mg/dL (ref <150)

## 2019-10-02 LAB — CBC
HCT: 27.4 % — ABNORMAL LOW (ref 39.0–52.0)
Hemoglobin: 8.2 g/dL — ABNORMAL LOW (ref 13.0–17.0)
MCH: 30.4 pg (ref 26.0–34.0)
MCHC: 29.9 g/dL — ABNORMAL LOW (ref 30.0–36.0)
MCV: 101.5 fL — ABNORMAL HIGH (ref 80.0–100.0)
Platelets: DECREASED 10*3/uL (ref 150–400)
RBC: 2.7 MIL/uL — ABNORMAL LOW (ref 4.22–5.81)
RDW: 15.5 % (ref 11.5–15.5)
WBC: 5.8 10*3/uL (ref 4.0–10.5)
nRBC: 0 % (ref 0.0–0.2)

## 2019-10-02 LAB — RENAL FUNCTION PANEL
Albumin: 1.6 g/dL — ABNORMAL LOW (ref 3.5–5.0)
Anion gap: 13 (ref 5–15)
BUN: 94 mg/dL — ABNORMAL HIGH (ref 8–23)
CO2: 21 mmol/L — ABNORMAL LOW (ref 22–32)
Calcium: 7.4 mg/dL — ABNORMAL LOW (ref 8.9–10.3)
Chloride: 115 mmol/L — ABNORMAL HIGH (ref 98–111)
Creatinine, Ser: 7.52 mg/dL — ABNORMAL HIGH (ref 0.61–1.24)
GFR calc Af Amer: 8 mL/min — ABNORMAL LOW (ref >60)
GFR calc non Af Amer: 7 mL/min — ABNORMAL LOW (ref >60)
Glucose, Bld: 113 mg/dL — ABNORMAL HIGH (ref 70–99)
Phosphorus: 6.3 mg/dL — ABNORMAL HIGH (ref 2.5–4.6)
Potassium: 4.9 mmol/L (ref 3.5–5.1)
Sodium: 149 mmol/L — ABNORMAL HIGH (ref 135–145)

## 2019-10-02 LAB — APTT: aPTT: 89 seconds — ABNORMAL HIGH (ref 24–36)

## 2019-10-02 MED ORDER — DOCUSATE SODIUM 50 MG/5ML PO LIQD
100.0000 mg | Freq: Two times a day (BID) | ORAL | Status: DC
  Administered 2019-10-02 – 2019-10-23 (×33): 100 mg
  Filled 2019-10-02 (×36): qty 10

## 2019-10-02 MED ORDER — ORAL CARE MOUTH RINSE
15.0000 mL | OROMUCOSAL | Status: DC
  Administered 2019-10-02 – 2019-10-04 (×20): 15 mL via OROMUCOSAL

## 2019-10-02 MED ORDER — POLYETHYLENE GLYCOL 3350 17 G PO PACK
17.0000 g | PACK | Freq: Two times a day (BID) | ORAL | Status: DC
  Administered 2019-10-02 – 2019-10-23 (×25): 17 g
  Filled 2019-10-02 (×31): qty 1

## 2019-10-02 MED ORDER — CHLORHEXIDINE GLUCONATE 0.12% ORAL RINSE (MEDLINE KIT)
15.0000 mL | Freq: Two times a day (BID) | OROMUCOSAL | Status: DC
  Administered 2019-10-02 – 2019-10-23 (×40): 15 mL via OROMUCOSAL

## 2019-10-02 MED ORDER — POLYETHYLENE GLYCOL 3350 17 G PO PACK
17.0000 g | PACK | Freq: Two times a day (BID) | ORAL | Status: DC
  Administered 2019-10-02: 11:00:00 17 g via ORAL

## 2019-10-02 MED ORDER — PANTOPRAZOLE SODIUM 40 MG PO PACK
40.0000 mg | PACK | Freq: Every day | ORAL | Status: DC
  Administered 2019-10-02 – 2019-10-23 (×22): 40 mg
  Filled 2019-10-02 (×23): qty 20

## 2019-10-02 NOTE — Progress Notes (Signed)
[facility] Progress Note    Assessment/Plan: **CKD 5:  Baseline Cr ~5 now with AKI secondary to acute hypoxic event 1/8.   UOP decent with no diuretic.  No current absolute indications for RRT but may develop in the coming days.    **Acute hypoxic resp failure: secondary to aspiration, intubated now; per PCCM.    **Hyperkalemia: expect just K load with operative tissue damage + CKD 5.  Lokelma 10 yesterday, resolved.  Use lokelma PRN for now.  **Hypernatremia:  PCCM started D5W gtt, follow.  **Anemia: improved following transfusion 1/7.  Iron replete 1 week ago. On ESA, dose increased on 1/7.  **HTN: normotensive to modestly hypotensive, no pressors.   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 No asterixis  Additional Objective Labs: Basic Metabolic Panel: Recent Labs  Lab 09/30/19 0405 10/01/19 0849 10/01/19 0925 10/02/19 0549  NA 148* 151* 150* 149*  K 4.8 4.5 4.6 4.9  CL 116*  --  116* 115*  CO2 20*  --  21* 21*  GLUCOSE 147*  --  174* 113*  BUN 78*  --  79* 94*  CREATININE 5.03*  --  5.97* 7.52*  CALCIUM 7.6*  --  7.5* 7.4*  PHOS 3.7  --  4.6 6.3*   Liver Function Tests: Recent Labs  Lab 09/30/19 0405 10/01/19 0925 10/02/19 0549  ALBUMIN 1.9* 2.0* 1.6*   No results for input(s): LIPASE, AMYLASE in the last 168 hours. CBC: Recent Labs  Lab 09/28/19 0559 09/29/19 0332 09/30/19 0405 10/01/19 0849 10/01/19 0925 10/02/19 0549  WBC  6.4 9.7 6.6  --  2.9* 5.8  NEUTROABS  --  8.0*  --   --   --   --   HGB 8.2* 7.2* 6.6* 8.8* 10.3* 8.2*  HCT 27.1* 24.0* 21.9* 26.0* 34.2* 27.4*  MCV 101.1* 101.3* 101.4*  --  100.3* 101.5*  PLT 174 160 144*  --  125* PLATELET CLUMPS NOTED ON SMEAR, COUNT APPEARS DECREASED   Blood Culture    Component Value Date/Time   SDES TRACHEAL ASPIRATE 10/01/2019 1210   SPECREQUEST NONE 10/01/2019 1210   CULT  10/01/2019 1210    FEW PSEUDOMONAS AERUGINOSA SUSCEPTIBILITIES TO FOLLOW CULTURE REINCUBATED FOR BETTER GROWTH Performed at [facility], [place] [address], [place], [place] [address]    REPTSTATUS PENDING 10/01/2019 1210    Cardiac Enzymes: No results for input(s): CKTOTAL, CKMB, CKMBINDEX, TROPONINI in the last 168 hours. CBG: Recent Labs  Lab 10/01/19 1945 10/01/19 2326 10/02/19 0326 10/02/19 0819 10/02/19 1209  GLUCAP 121* 97 109* 110* 111*   Iron Studies: No results for input(s): IRON, TIBC, TRANSFERRIN, FERRITIN in the last 72 hours. _1 @ Studies/Results: Portable Chest xray  Result Date: 10/02/2019 CLINICAL DATA:  Intubated. EXAM: PORTABLE CHEST 1 VIEW COMPARISON:  10/01/2019 FINDINGS: Endotracheal tube terminates 3 cm above the carina. Enteric tube courses into the abdomen with tip not imaged. The cardiomediastinal silhouette  is unchanged with normal heart size. Mixed interstitial and airspace opacities in the right greater than left lungs, greatest in the bases, are unchanged on the left but significantly improved in the right base. No pleural effusion or pneumothorax is identified. IMPRESSION: Bilateral lower lung airspace disease with improved aeration on the right. Electronically Signed   By: Logan Bores M.D.   On: 10/02/2019 09:51   DG CHEST PORT 1 VIEW  Result Date: 10/01/2019 CLINICAL DATA:  Status post intubation today. EXAM: PORTABLE CHEST 1 VIEW COMPARISON:  Single-view of the chest 10/01/2019 at 2:53 a.m. FINDINGS: Endotracheal tube is now in place  with the tip in good position 3 cm above the carina. New NG tube courses into the stomach and below the inferior margin of the film. Airspace disease in the right lung base has worsened. Mild airspace disease in the left lung base is unchanged. Heart size is normal. No pneumothorax or pleural effusion. IMPRESSION: Endotracheal tube is in good position. OG tube courses into the stomach and below the inferior margin the film. Worsened right lower lobe airspace disease which could be due to aspiration or pneumonia. Electronically Signed   By: Inge Rise M.D.   On: 10/01/2019 07:24   DG CHEST PORT 1 VIEW  Result Date: 10/01/2019 CLINICAL DATA:  Shortness of breath EXAM: PORTABLE CHEST 1 VIEW COMPARISON:  09/27/2019 FINDINGS: Status post right shoulder total arthroplasty. Increased opacity at the right lung base. No pleural effusion or pneumothorax. Normal cardiomediastinal contours. Mild left basilar opacities, likely atelectasis. IMPRESSION: Right-greater-than-left basilar opacities, which may indicate aspiration or atelectasis. Electronically Signed   By: Ulyses Jarred M.D.   On: 10/01/2019 03:11   DG Abd Portable 1V  Result Date: 10/01/2019 CLINICAL DATA:  Vomiting, fever and nausea EXAM: PORTABLE ABDOMEN - 1 VIEW COMPARISON:  Radiograph 09/22/2019 FINDINGS: A transesophageal tube tip is directed towards the gastric antrum/duodenal bulb with mild kinking. Side port is distal to the GE junction. Radiodense contrast material partially opacifies the colon. Few air-filled loops of bowel are seen in the mid abdomen. Patchy mixed interstitial airspace opacities are present throughout the lungs. No pneumothorax or effusion. Cardiomediastinal contours as included are unremarkable. Mild degenerative changes present in the spine. IMPRESSION: 1. A transesophageal tube tip is directed towards the gastric antrum/duodenal bulb with mild kinking. Side port is distal to the GE junction. 2. Radiodense contrast material  partially opacifies the colon. Electronically Signed   By: Lovena Le M.D.   On: 10/01/2019 05:41   Medications: . sodium chloride    . ampicillin-sulbactam (UNASYN) IV 3 g (10/02/19 0807)  . argatroban 1.45 mcg/kg/min (10/02/19 1105)  . feeding supplement (VITAL AF 1.2 CAL) 20 mL/hr at 10/02/19 0600   . sodium chloride   Intravenous Once  . chlorhexidine gluconate (MEDLINE KIT)  15 mL Mouth Rinse BID  . Chlorhexidine Gluconate Cloth  6 each Topical Daily  . darbepoetin (ARANESP) injection - NON-DIALYSIS  150 mcg Subcutaneous Q Thu-1800  . docusate  100 mg Per Tube BID  . free water  200 mL Per Tube Q8H  . insulin aspart  0-9 Units Subcutaneous Q4H  . mouth rinse  15 mL Mouth Rinse 10 times per day  . pantoprazole sodium  40 mg Per Tube Daily  . polyethylene glycol  17 g Per Tube BID  . sodium zirconium cyclosilicate  10 g Oral BID

## 2019-10-02 NOTE — Progress Notes (Signed)
NAME:  Ryan Villa, MRN:  [id number], DOB:  01/01/1956, LOS: 89 ADMISSION DATE:  09/15/2019, CONSULTATION DATE:  09/15/2019 REFERRING MD:  [facility] CHIEF COMPLAINT: Respiratory failure, rhabdomyolysis, acute kidney injury  Brief History   64 year old male transferred from [facility] 12/23 for acute respiratory failure, rhabdomyolysis, acute kidney injury due to services unavailable at that facility.  Self extubated 12/25.  Taken to OR for right shoulder surgery 1/5.  Had vomiting with probable aspiration early AM 1/87, PCCM re-consulted.  History of present illness   This is a 63 year old male past medical history CKD stage IV, diabetes mellitus type 2, hyperlipidemia, hypertension, left carotid stenosis, intracerebral hemorrhage, status post right BKA presented to [facility] emergency department after apparently falling at his home 3 days ago, apparently lying on the floor since that time.  EMS reported that the patient's sister was bringing him food while he was lying on the floor during that 3-day period. He is status post recent right BKA and uses a prosthetic and is supposed to use a walker but is noncompliant with his per family.  Reportedly not using his walker when he fell and landed on his right side.  Unclear if there was loss of consciousness.  At that time the patient did not want his sister to call 911.  On 12/23 she called 911 because she noticed that he was having a hard time breathing and bruising on his right shoulder.  On arrival to [facility] ED the patient was able to answer questions with difficulty in understanding his speech.  He was hypoxic on room air and placed on nonrebreather, noted to appear severely ill appearing and tachypneic.  He was also noted to be covered in dried stool/urine.  He complained of right shoulder pain.  Work-up and findings significant for hypothermia with a temperature of 93.9 F, significant ecchymosis over the right shoulder area, hypotensive, given IV  fluids.  He progressed to have worsening respiratory distress, hypotension, became unresponsive and subsequently intubated, central line placed.  He had significant metabolic derangements including sodium 149, potassium 6.8, BUN 172, creatinine 10.5.  CK 2411. Procalcitonin 0.61.  UDS negative.  pH less than 7 per ABG report.  He responded fairly well to IV fluids and briefly required pressor support.  Repeat labs showed minimal improvement in metabolic derangements.  He had some urine output, milky white in color.  Noted right humeral neck fracture on x-ray per ER paper chart, right upper lobe consolidation per chest x-ray, head CT no acute intracranial process, Diffuse parasinus inflammation.   He self extubated 12/25.  Transferred out of ICU 12/27. Taken for right shoulder reverse arthroplasty 1/5.  Early AM 1/8 had vomiting with probable aspiration.  Placed on NRB and transferred to ICU for intubation 10/01/2019   Past Medical History  Diabetes mellitus type 2 CKD stage IV Hyperlipidemia Hypertension Left carotid gnosis Intracerebral hemorrhage Status post right [place] [facility]   12/23 admitted  12/25 self extubated 12/27 transferred out of ICU 1/2 NG removed and started on dysphagia 1 diet 1/5 right reverse shoulder arthroplasty 1/6 advanced to dysphagia 3 diet 1/8 vomited multiple times.  Presumed aspiration.  Transferred to ICU for possible intubation  Consults:  Nephrology Orthopedics  Procedures:  NA  Significant Diagnostic Tests:  CXR 1/8 > aspiration  Micro Data:  SARS Coronavirus 2 negative per [facility] paper chart  Antimicrobials:  Ceftriaxone 1 dose 12/23 OSH>> 1220 Azithromycin 1 dose 12/23 OSH>>12/26 Unasyn 1/8 >   Interim history/subjective:  CXR 1/9>> Mixed interstitial and airspace opacities in the right greater than left lungs, > per  bases, unchanged on  left but  improved in the right base. Febrile to 100.6.  On 50%  FiO2 Radiodense contrast material partially opacifies the colon suspect this is the reason for vomiting  Objective   Blood pressure (!) 123/52, pulse 75, temperature 98.5 F (36.9 C), temperature source Oral, resp. rate 20, height 5\' 8"  (1.727 m), weight 67.1 kg, SpO2 96 %.    Vent Mode: PRVC FiO2 (%):  [50 %-60 %] 50 % Set Rate:  [18 bmp] 18 bmp Vt Set:  [460 mL] 460 mL PEEP:  [5 cmH20] 5 cmH20 Plateau Pressure:  [8 cmH20-16 cmH20] 11 cmH20   Intake/Output Summary (Last 24 hours) at 10/02/2019 1145 Last data filed at 10/02/2019 0800 Gross per 24 hour  Intake 1133 ml  Output 300 ml  Net 833 ml   Filed Weights   09/28/19 2052 10/01/19 0628 10/02/19 0500  Weight: 63.8 kg 66.2 kg 67.1 kg    Examination: General: Intubated male ,no sedation, in NAD Neuro: Sleepy but arouses to voice and follows basic commands. HEENT: [place]/AT. NG in place with minimal output. Cardiovascular: S1, S2, RRR, no M/R/G.  Lungs: Bilateral chest excursion, Respirations unlabored,  Course rhonchi per  bases R > L. Abdomen: BS x 4, soft, NT/ND. BS +, NO BM Musculoskeletal: R BKA. No edema, Skin: Intact, warm, no rashes, no lesions.   Assessment & Plan:   Acute hypoxic respiratory failure - presumed 2/2 aspiration after multiple vomiting episodes early AM 1/8.  Did receive 7 days abx   Plan - Continue ventilator support with lung protective strategies - Wean PEEP and FiO2 for sats > 90% - HOB elevated > 30 degrees - Plateau pressures < 30 cm H2O - Continue Unasyn - Trend CXR and ABG prn - Ensure adequate pulmonary hygiene - VAP bundle in place - PAD protocol   Vomiting. Most likely 2/2 to retained barium in the colon - Zofran PRN. - Bowel Regimen with enema, Miralax, Colace - Hold TF until patient has moved bowels to decrease chance of emesis/ aspiration  Dysphagia - had NG removed 1/2 and advanced to dysphagia 3 diet 1/6.  Now with vomiting early AM 1/8 leading to aspiration. - Change to NPO. -  Might need MBS once stabilized from respiratory standpoint.  CKD 5. Rhabdo - resolved. Creatinine now 7.5 Plan - Appreciate nephrology input. - Follow BMP. - May need HD as he is sleepy - May need HD access  Hyperkalemia post op - resolved. - Monitor closely - Trend BMET.  Mild hypernatremia. - Gentle D5W. - Trend BMET  Hyperglycemia. - Change SSI to q4hrs.  Anemia - partly expected post op.  Had 2u PRBC 1/7. HGB down trending to 8.2 on 1/9 - Transfuse for Hgb < 7. - Monitor for active bleeding  HIT - Ab and SRA pos. Concern PE / DVT - VQ neg.  CTA not performed due to CKD5. - Continue argatroban per pharm. - Per TRH notes, plan was to transition to eliquis x 4 weeks. - Can consider LE or UE duplex. - Platelets are clumped on todays CBC Plan Monitor CBC Trend platelets Continue Atgatroban  Hx HTN. - D/c norvasc. - BP stable at present  E.coli UTI - s/p course ceftriaxone. - No further interventions required. - Trend WBC and fever curve - re-culture as is indicated  Right proximal humerus fracture - s/p reverse shoulder arthroplasty 1/5. -  Post op care per ortho. - PT / OT.  Generalized failure to thrive. Severe malnutrition. Hx CVA. - PT / OT. - Will need to assess nutrition options once over acute issue (? MBS).  Best practice:  Diet: N.p.o. Pain/Anxiety/Delirium protocol (if indicated): N/A. VAP protocol (if indicated): N/A. DVT prophylaxis: Argatroban GI prophylaxis: None. Glucose control: Sliding scale insulin every 4 hours Mobility: Bedrest Code Status: Full Family Communication: per nursing Disposition: Transfer to ICU.   Magdalen Spatz, MSN, AGACNP-BC [place] Pager # [phone] After 4 pm please call [phone] 10/02/2019, 11:45 AM

## 2019-10-02 NOTE — Progress Notes (Signed)
Physical Therapy Treatment Patient Details Name: Ryan Villa MRN: [id number] DOB: July 02, 1956 Today's Date: 10/02/2019    History of Present Illness Pt adm with rt humeral fx after fall at home and in the floor x 3 days. Pt intubated 12/23 and self extubated 12/25. Pt with acute metabolic encephalopathy and delirium, PMH - rt bka, ckd, dm, htn, iintracerebral hemorrhage. Pt now s/p R TSA 1/5. Pt re-intubated 1/8 due to suspected aspiration PNA.    PT Comments    Pt re-intubated on 1/8. Pt following 1 step commands consistently and participating in therapy. Pt assist with transfer to EOB and engaged in LE exercises. Pt remains to require modA for mobility and suspect pt to cont to be maxAX2 for OOB mobility. Pt remains very deconditioned and is unable to move R LE on own. Pt with significant R UE swelling as well. Pt remains appropriate for SNF upon d/c once medically stable.    Follow Up Recommendations  SNF     Equipment Recommendations  None recommended by PT    Recommendations for Other Services       Precautions / Restrictions Precautions Precautions: Fall;Shoulder Type of Shoulder Precautions: okay to WB with transfers, otherwise no lifting; sling on when not ambulating or doing BADLs, okay for pendulums; no abduction; ER: 30 degrees; no AROM; okay hand wrist and elbow AROM Precaution Comments: R humerus ORIF 1/5 Required Braces or Orthoses: Sling Restrictions Weight Bearing Restrictions: Yes RUE Weight Bearing: Non weight bearing Other Position/Activity Restrictions: able to WB with RW with transfers however now back on vent    Mobility  Bed Mobility Overal bed mobility: Needs Assistance Bed Mobility: Sidelying to Sit;Sit to Supine   Sidelying to sit: Mod assist   Sit to supine: Min assist   General bed mobility comments: pt used L UE to pull up on bed rail, modA to manage R UE and for trunk elevation,pt returned self to bed impulsively  Transfers                  General transfer comment: deferred due to being on vent  Ambulation/Gait                 Stairs             Wheelchair Mobility    Modified Rankin (Stroke Patients Only)       Balance Overall balance assessment: Needs assistance Sitting-balance support: Single extremity supported;No upper extremity supported;Feet supported Sitting balance-Leahy Scale: Poor Sitting balance - Comments: fluctuating from min guard to modA, pt requiring max verbal cues to use L UE to support self. Pt able to sit x 10 min and complete LE ther ex and work on achieving upright posture and maintaining it       Standing balance comment: didn't attempt standing                            Cognition Arousal/Alertness: Awake/alert Behavior During Therapy: WFL for tasks assessed/performed Overall Cognitive Status: Impaired/Different from baseline                     Current Attention Level: Selective Memory: Decreased recall of precautions;Decreased short-term memory Following Commands: Follows one step commands with increased time Safety/Judgement: Decreased awareness of safety   Problem Solving: Requires verbal cues;Requires tactile cues;Slow processing General Comments: pt reaching with L hand for suctioner however trying to use it with ET tube in and with suction  covered      Exercises General Exercises - Lower Extremity Long Arc Quad: AAROM;Both;10 reps;Seated Heel Slides: AROM;10 reps;Left(with manual resistance) Hip ABduction/ADduction: AROM;Both;10 reps;Seated Hip Flexion/Marching: AROM;Both;10 reps;Seated    General Comments General comments (skin integrity, edema, etc.): VSS, on vent      Pertinent Vitals/Pain Pain Assessment: Faces Faces Pain Scale: No hurt    Home Living                      Prior Function            PT Goals (current goals can now be found in the care plan section) Progress towards PT goals: Not progressing  toward goals - comment(pt now back on vent)    Frequency    Min 2X/week      PT Plan Current plan remains appropriate    Co-evaluation              AM-PAC PT "6 Clicks" Mobility   Outcome Measure  Help needed turning from your back to your side while in a flat bed without using bedrails?: A Little Help needed moving from lying on your back to sitting on the side of a flat bed without using bedrails?: A Little Help needed moving to and from a bed to a chair (including a wheelchair)?: Total Help needed standing up from a chair using your arms (e.g., wheelchair or bedside chair)?: Total Help needed to walk in hospital room?: Total Help needed climbing 3-5 steps with a railing? : Total 6 Click Score: 10    End of Session Equipment Utilized During Treatment: Gait belt;Oxygen Activity Tolerance: Patient tolerated treatment well;Patient limited by fatigue Patient left: in bed;with call bell/phone within reach Nurse Communication: Mobility status PT Visit Diagnosis: Other abnormalities of gait and mobility (R26.89);Muscle weakness (generalized) (M62.81);Pain Pain - Right/Left: Right Pain - part of body: Shoulder     Time: [id number] PT Time Calculation (min) (ACUTE ONLY): 23 min  Charges:  $Therapeutic Exercise: 8-22 mins $Therapeutic Activity: 8-22 mins                     Kittie Plater, PT, DPT Acute Rehabilitation Services Pager #: [phone] Office #: [phone]    Berline Lopes 10/02/2019, 9:35 AM

## 2019-10-02 NOTE — Progress Notes (Addendum)
[place] for Argatroban Indication: + HIT  Allergies  Allergen Reactions  . 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)      Patient Measurements: Height: 5\' 8"  (172.7 cm) Weight: 147 lb 14.9 oz (67.1 kg) IBW/kg (Calculated) : 68.4  Vital Signs: Temp: 98 F (36.7 C) (01/09 0400) Temp Source: Oral (01/09 0400) BP: 123/52 (01/09 0846) Pulse Rate: 75 (01/09 0846)  Labs: Recent Labs    09/30/19 0405 10/01/19 0849 10/01/19 0925 10/02/19 0549  HGB 6.6* 8.8* 10.3* 8.2*  HCT 21.9* 26.0* 34.2* 27.4*  PLT 144*  --  125* PLATELET CLUMPS NOTED ON SMEAR, COUNT APPEARS DECREASED  APTT 61*  --  59* 89*  CREATININE 5.03*  --  5.97* 7.52*    Estimated Creatinine Clearance: 9.5 mL/min (A) (by C-G formula based on SCr of 7.52 mg/dL (H)).   Assessment: 40 YOM on heparin SQ for VTE prophylaxis with a >50% drop in platelets on day 7 of therapy, concerning for HITT. Heparin was stopped and SRA was positive on 09/22/19.  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 The guidelines are unclear on the intended length of therapy with the patient not having a confirmed thrombosis but could consider continuing anticoagulation for 4 weeks. The patient could eventually transition to oral anticoagulation.   His aPTT this morning remains therapeutic at 89 sec and appears to be accumulating.  No bleeding reported; he is on Aranesp for anemia.  Goal of Therapy:  aPTT 50-90 seconds Monitor platelets by anticoagulation protocol: Yes   Plan:  Reduce argatroban slightly to 1.45 mcg/kg/min Daily aPTT and CBC Monitor closely for s/sx of bleeding F/U transition to Eliquis as CBC stabilizes  Kirstan Fentress D. Mina Marble, PharmD, BCPS, [place] 10/02/2019, 9:00 AM

## 2019-10-02 NOTE — Plan of Care (Signed)
°  Problem: Coping: °Goal: Level of anxiety will decrease °Outcome: Progressing °  °

## 2019-10-03 ENCOUNTER — Inpatient Hospital Stay (HOSPITAL_COMMUNITY): Payer: Medicare Other

## 2019-10-03 LAB — RENAL FUNCTION PANEL
Albumin: 1.6 g/dL — ABNORMAL LOW (ref 3.5–5.0)
Anion gap: 13 (ref 5–15)
BUN: 98 mg/dL — ABNORMAL HIGH (ref 8–23)
CO2: 18 mmol/L — ABNORMAL LOW (ref 22–32)
Calcium: 7.6 mg/dL — ABNORMAL LOW (ref 8.9–10.3)
Chloride: 118 mmol/L — ABNORMAL HIGH (ref 98–111)
Creatinine, Ser: 8.28 mg/dL — ABNORMAL HIGH (ref 0.61–1.24)
GFR calc Af Amer: 7 mL/min — ABNORMAL LOW (ref >60)
GFR calc non Af Amer: 6 mL/min — ABNORMAL LOW (ref >60)
Glucose, Bld: 91 mg/dL (ref 70–99)
Phosphorus: 7.1 mg/dL — ABNORMAL HIGH (ref 2.5–4.6)
Potassium: 4.5 mmol/L (ref 3.5–5.1)
Sodium: 149 mmol/L — ABNORMAL HIGH (ref 135–145)

## 2019-10-03 LAB — CBC
HCT: 26.1 % — ABNORMAL LOW (ref 39.0–52.0)
Hemoglobin: 8.1 g/dL — ABNORMAL LOW (ref 13.0–17.0)
MCH: 30.7 pg (ref 26.0–34.0)
MCHC: 31 g/dL (ref 30.0–36.0)
MCV: 98.9 fL (ref 80.0–100.0)
Platelets: 115 10*3/uL — ABNORMAL LOW (ref 150–400)
RBC: 2.64 MIL/uL — ABNORMAL LOW (ref 4.22–5.81)
RDW: 14.9 % (ref 11.5–15.5)
WBC: 7.1 10*3/uL (ref 4.0–10.5)
nRBC: 0 % (ref 0.0–0.2)

## 2019-10-03 LAB — GLUCOSE, CAPILLARY
Glucose-Capillary: 80 mg/dL (ref 70–99)
Glucose-Capillary: 86 mg/dL (ref 70–99)
Glucose-Capillary: 96 mg/dL (ref 70–99)
Glucose-Capillary: 87 mg/dL (ref 70–99)
Glucose-Capillary: 85 mg/dL (ref 70–99)

## 2019-10-03 LAB — MAGNESIUM: Magnesium: 2 mg/dL (ref 1.7–2.4)

## 2019-10-03 LAB — TRIGLYCERIDES: Triglycerides: 132 mg/dL (ref <150)

## 2019-10-03 LAB — APTT: aPTT: 77 seconds — ABNORMAL HIGH (ref 24–36)

## 2019-10-03 MED ORDER — SODIUM CHLORIDE 0.9 % IV SOLN
1.0000 g | INTRAVENOUS | Status: AC
  Administered 2019-10-03: 1 g via INTRAVENOUS
  Filled 2019-10-03: qty 1

## 2019-10-03 MED ORDER — FREE WATER
300.0000 mL | Freq: Four times a day (QID) | Status: DC
  Administered 2019-10-03 – 2019-10-04 (×5): 300 mL

## 2019-10-03 MED ORDER — SODIUM CHLORIDE 0.9 % IV SOLN
1.0000 g | INTRAVENOUS | Status: DC
  Administered 2019-10-04: 1 g via INTRAVENOUS
  Filled 2019-10-03: qty 1

## 2019-10-03 MED ORDER — DEXTROSE 5 % IV SOLN
0.5000 g | INTRAVENOUS | Status: DC
  Filled 2019-10-03: qty 0.5

## 2019-10-03 NOTE — Progress Notes (Signed)
NAME:  Ryan Villa, MRN:  [id number], DOB:  1956/09/02, LOS: 59 ADMISSION DATE:  09/15/2019, CONSULTATION DATE:  09/15/2019 REFERRING MD:  [facility] CHIEF COMPLAINT: Respiratory failure, rhabdomyolysis, acute kidney injury  Brief History   64 year old male transferred from [facility] 12/23 for acute respiratory failure, rhabdomyolysis, acute kidney injury due to services unavailable at that facility.  Self extubated 12/25.  Taken to OR for right shoulder surgery 1/5.  Had vomiting with probable aspiration early AM 1/87, PCCM re-consulted.  History of present illness   This is a 64 year old male past medical history CKD stage IV, diabetes mellitus type 2, hyperlipidemia, hypertension, left carotid stenosis, intracerebral hemorrhage, status post right BKA presented to [facility] emergency department after apparently falling at his home 3 days ago, apparently lying on the floor since that time.  EMS reported that the patient's sister was bringing him food while he was lying on the floor during that 3-day period. He is status post recent right BKA and uses a prosthetic and is supposed to use a walker but is noncompliant with his per family.  Reportedly not using his walker when he fell and landed on his right side.  Unclear if there was loss of consciousness.  At that time the patient did not want his sister to call 911.  On 12/23 she called 911 because she noticed that he was having a hard time breathing and bruising on his right shoulder.  On arrival to [facility] ED the patient was able to answer questions with difficulty in understanding his speech.  He was hypoxic on room air and placed on nonrebreather, noted to appear severely ill appearing and tachypneic.  He was also noted to be covered in dried stool/urine.  He complained of right shoulder pain.  Work-up and findings significant for hypothermia with a temperature of 93.9 F, significant ecchymosis over the right shoulder area, hypotensive, given IV  fluids.  He progressed to have worsening respiratory distress, hypotension, became unresponsive and subsequently intubated, central line placed.  He had significant metabolic derangements including sodium 149, potassium 6.8, BUN 172, creatinine 10.5.  CK 2411. Procalcitonin 0.61.  UDS negative.  pH less than 7 per ABG report.  He responded fairly well to IV fluids and briefly required pressor support.  Repeat labs showed minimal improvement in metabolic derangements.  He had some urine output, milky white in color.  Noted right humeral neck fracture on x-ray per ER paper chart, right upper lobe consolidation per chest x-ray, head CT no acute intracranial process, Diffuse parasinus inflammation.   He self extubated 12/25.  Transferred out of ICU 12/27. Taken for right shoulder reverse arthroplasty 1/5.  Early AM 1/8 had vomiting with probable aspiration.  Placed on NRB and transferred to ICU for intubation 10/01/2019   Past Medical History  Diabetes mellitus type 2 CKD stage IV Hyperlipidemia Hypertension Left carotid gnosis Intracerebral hemorrhage Status post right [facility]   12/23 admitted  12/25 self extubated 12/27 transferred out of ICU 1/2 NG removed and started on dysphagia 1 diet 1/5 right reverse shoulder arthroplasty 1/6 advanced to dysphagia 3 diet 1/8 vomited multiple times.  Presumed aspiration.  Transferred to ICU for possible intubation  Consults:  Nephrology Orthopedics  Procedures:  NA  Significant Diagnostic Tests:  CXR 1/8 > aspiration  Micro Data:  SARS Coronavirus 2 negative per [facility] paper chart Sputum 1/08 >> Pseudomonas aeurginosa  Antimicrobials:  Ceftriaxone 1 dose 12/23 OSH>> 1220 Azithromycin 1 dose 12/23 OSH>>12/26 Unasyn 1/8 >  1/10 Tressie Ellis 1/10 >>   Interim history/subjective:  Remains on full vent support.  Objective   Blood pressure 140/67, pulse 66, temperature 99.1 F (37.3 C), temperature source Oral, resp.  rate 18, height 5\' 8"  (1.727 m), weight 67.1 kg, SpO2 97 %.    Vent Mode: PRVC FiO2 (%):  [50 %] 50 % Set Rate:  [18 bmp] 18 bmp Vt Set:  [460 mL] 460 mL PEEP:  [5 cmH20] 5 cmH20 Plateau Pressure:  [11 cmH20-13 cmH20] 13 cmH20   Intake/Output Summary (Last 24 hours) at 10/03/2019 0644 Last data filed at 10/03/2019 0600 Gross per 24 hour  Intake 348.6 ml  Output 250 ml  Net 98.6 ml   Filed Weights   09/28/19 2052 10/01/19 0628 10/02/19 0500  Weight: 63.8 kg 66.2 kg 67.1 kg    Examination:  General - sedated Eyes - pupils reactive ENT - ETT in place Cardiac - regular rate/rhythm, no murmur Chest - b/l crackles Abdomen - soft, non tender, + bowel sounds Extremities - Rt BKA Skin - Rt arm in wrap Neuro - RASS -1    Rhabdomyolysis, Hyperkalemia, E coli UTI   Assessment & Plan:   Acute hypoxic respiratory failure from Pseudomonal HCAP. - full vent support - f/u CXR - change Abx to fortaz  Vomiting. Most likely 2/2 to retained barium in the colon. Dysphagia. Severe protein calorie malnutrition. - prn zofran - bowel regimen - hold tube feeds for now - f/u abd xray  CKD 5. Hypernatremia. - renal following  Anemia of critical illness and chronic disease. - f/u CBC - transfuse for Hb < 7 or significant bleeding  Hyperglycemia. - Change SSI to q4hrs.  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 - D/c norvasc. - BP stable at present  Right proximal humerus fracture - s/p reverse shoulder arthroplasty 1/5. - Post op care per ortho. - PT / OT.  Best practice:  Diet: N.p.o. DVT prophylaxis: Argatroban GI prophylaxis: protonix Mobility: Bedrest Code Status: Full Disposition: Transfer to ICU.  Labs:   CMP Latest Ref Rng & Units 10/03/2019 10/02/2019 10/01/2019  Glucose 70 - 99 mg/dL 91 113(H) 174(H)  BUN 8 - 23 mg/dL 98(H) 94(H) 79(H)  Creatinine 0.61 - 1.24 mg/dL 8.28(H) 7.52(H) 5.97(H)  Sodium  135 - 145 mmol/L 149(H) 149(H) 150(H)  Potassium 3.5 - 5.1 mmol/L 4.5 4.9 4.6  Chloride 98 - 111 mmol/L 118(H) 115(H) 116(H)  CO2 22 - 32 mmol/L 18(L) 21(L) 21(L)  Calcium 8.9 - 10.3 mg/dL 7.6(L) 7.4(L) 7.5(L)  Total Protein 6.5 - 8.1 g/dL - - -  Total Bilirubin 0.3 - 1.2 mg/dL - - -  Alkaline Phos 38 - 126 U/L - - -  AST 15 - 41 U/L - - -  ALT 0 - 44 U/L - - -    CBC Latest Ref Rng & Units 10/03/2019 10/02/2019 10/01/2019  WBC 4.0 - 10.5 K/uL 7.1 5.8 2.9(L)  Hemoglobin 13.0 - 17.0 g/dL 8.1(L) 8.2(L) 10.3(L)  Hematocrit 39.0 - 52.0 % 26.1(L) 27.4(L) 34.2(L)  Platelets 150 - 400 K/uL 115(L) PLATELET CLUMPS NOTED ON SMEAR, COUNT APPEARS DECREASED 125(L)    ABG    Component Value Date/Time   PHART 7.430 10/01/2019 0849   PCO2ART 37.3 10/01/2019 0849   PO2ART 239.0 (H) 10/01/2019 0849   HCO3 24.5 10/01/2019 0849   TCO2 26 10/01/2019 0849   ACIDBASEDEF 2.9 (H) 10/01/2019 0540   O2SAT 100.0  10/01/2019 0849    CBG (last 3)  Recent Labs    10/02/19 1934 10/02/19 2322 10/03/19 0335  GLUCAP 80 84 80    CC time 31 minutes  Chesley Mires, MD [place] 10/03/2019, 6:52 AM

## 2019-10-03 NOTE — Progress Notes (Signed)
   Subjective:  Continues on vent   Objective:   VITALS:   Vitals:   10/03/19 0722 10/03/19 0800 10/03/19 0900 10/03/19 1000  BP: 140/74 (!) 141/59 (!) 141/62 135/69  Pulse: 71 71 71 75  Resp: 20 19 18 17   Temp:  98.2 F (36.8 C)    TempSrc:  Axillary    SpO2: 94% 97% 95% 95%  Weight:      Height:        NAD, intubated RUE- incision is c/d/i, with dressing clean  3+ edema throughout 2 + radial pulse  Lab Results  Component Value Date   WBC 7.1 10/03/2019   HGB 8.1 (L) 10/03/2019   HCT 26.1 (L) 10/03/2019   MCV 98.9 10/03/2019   PLT 115 (L) 10/03/2019   BMET    Component Value Date/Time   NA 149 (H) 10/03/2019 0137   K 4.5 10/03/2019 0137   CL 118 (H) 10/03/2019 0137   CO2 18 (L) 10/03/2019 0137   GLUCOSE 91 10/03/2019 0137   BUN 98 (H) 10/03/2019 0137   CREATININE 8.28 (H) 10/03/2019 0137   CALCIUM 7.6 (L) 10/03/2019 0137   GFRNONAA 6 (L) 10/03/2019 0137   GFRAA 7 (L) 10/03/2019 0137     Assessment/Plan: 5 Days Post-Op   Principal Problem:   Closed fracture of right proximal humerus Active Problems:   Acute respiratory failure (HCC)   AKI (acute kidney injury) ([place])   Rhabdomyolysis   Acute renal failure (ARF) (HCC)   Pressure injury of skin   Protein-calorie malnutrition, severe   Encounter for feeding tube placement   Aspiration into airway   Fracture  - OK to WBAT to RUE with walker only, otherwise NWB and sling when able - prn dry dressings to right arm  - will follow from afar until extubated, call with questions  Nicholes Stairs 10/03/2019, 11:38 AM   Geralynn Rile, MD [phone]

## 2019-10-03 NOTE — Progress Notes (Signed)
Pharmacy Antibiotic Note  Ryan Villa is a 64 y.o. male admitted on 09/15/2019 with pneumonia.  Pharmacy has been consulted for [facility] dosing.  ID: D#[id number] for PNA (vomited 1/8, poss. aspiration, intubated),  Afebrile, WBC WNL. Escalate abx for HCAP with Pseudomonas  Azith 12/23>>12/26 CTX 12/23>> 12/29 Unasyn 1/7 >>1/10 Fortaz 1/10>>  12/23 BCx - negative 12/23 UCx - 80k E.coli (sens CTX) 12/23 MRSA PCR - positive 12/28 covid - negative 1/8 TA - few PSA   Plan: Change Unasyn to [employer] 500mg  IV q24h   Height: 5\' 8"  (172.7 cm) Weight: 147 lb 14.9 oz (67.1 kg) IBW/kg (Calculated) : 68.4  Temp (24hrs), Avg:98.9 F (37.2 C), Min:98.5 F (36.9 C), Max:99.3 F (37.4 C)  Recent Labs  Lab 09/29/19 0332 09/29/19 1255 09/30/19 0405 10/01/19 0925 10/02/19 0549 10/03/19 0137  WBC 9.7  --  6.6 2.9* 5.8 7.1  CREATININE 5.07* 5.23* 5.03* 5.97* 7.52* 8.28*    Estimated Creatinine Clearance: 8.7 mL/min (A) (by C-G formula based on SCr of 8.28 mg/dL (H)).    Allergies  Allergen Reactions  . 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)       Ryan Villa S. Alford Highland, PharmD, BCPS Clinical Staff Pharmacist [email or web address] [place] 10/03/2019 7:02 AM

## 2019-10-03 NOTE — Progress Notes (Signed)
[facility] Progress Note    Assessment/Plan: **CKD 5:  Baseline Cr ~5 now with AKI secondary to acute hypoxic event 1/8, cr up to 8.28.   UOP down to 236m yesterday.  No current absolute indications for RRT but may develop in the coming days.  I called his contact in the chart - mother, she consents to dialysis if needed.   **Acute hypoxic resp failure: secondary to aspiration, intubated now; per PCCM.    **Hyperkalemia: expect just K load with operative tissue damage + CKD 5.  Lokelma 10 yesterday, resolved.  Use lokelma PRN for now.  **Hypernatremia:  PCCM started D5W gtt, follow.  **Anemia: improved following transfusion 1/7.  Iron replete 1 week ago. On ESA, dose increased on 1/7.  **HTN: normotensive to modestly hypotensive, no pressors.   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 No asterixis  Additional Objective Labs: Basic Metabolic Panel: Recent Labs  Lab 10/01/19 0925 10/02/19 0549 10/03/19 0137  NA 150* 149* 149*  K 4.6 4.9 4.5  CL 116* 115* 118*  CO2 21* 21* 18*  GLUCOSE 174* 113* 91  BUN 79* 94* 98*  CREATININE 5.97* 7.52* 8.28*  CALCIUM 7.5* 7.4* 7.6*  PHOS 4.6 6.3* 7.1*   Liver Function Tests: Recent Labs  Lab 10/01/19 0925 10/02/19 0549 10/03/19 0137  ALBUMIN 2.0* 1.6* 1.6*   No results for input(s): LIPASE, AMYLASE in the last 168 hours. CBC: Recent Labs  Lab 09/29/19 0332 09/30/19 0405  10/01/19 0925 10/02/19 0549 10/03/19 0137  WBC 9.7 6.6 2.9* 5.8 7.1  NEUTROABS 8.0*  --   --   --   --   HGB 7.2* 6.6* 10.3* 8.2* 8.1*  HCT 24.0* 21.9* 34.2* 27.4* 26.1*  MCV 101.3* 101.4* 100.3* 101.5* 98.9  PLT 160 144* 125* PLATELET CLUMPS NOTED ON SMEAR, COUNT APPEARS DECREASED 115*   Blood Culture    Component Value Date/Time   SDES TRACHEAL ASPIRATE 10/01/2019 1210   SPECREQUEST NONE 10/01/2019 1210   CULT  10/01/2019 1210    FEW PSEUDOMONAS AERUGINOSA CULTURE REINCUBATED FOR BETTER GROWTH Performed at [facility] [id number] [place]  [id number]   REPTSTATUS PENDING 10/01/2019 1210    Cardiac Enzymes: No results for input(s): CKTOTAL, CKMB, CKMBINDEX, TROPONINI in the last 168 hours. CBG: Recent Labs  Lab 10/02/19 1934 10/02/19 2322 10/03/19 0335 10/03/19 0749 10/03/19 1220  GLUCAP 80 84 80 86 96   Iron Studies: No results for input(s): IRON, TIBC, TRANSFERRIN, FERRITIN in the last 72 hours. '@[email or web address]' @ Studies/Results: DG CHEST PORT 1 VIEW  Result Date: 10/03/2019 CLINICAL DATA:  Respiratory failure. EXAM: PORTABLE CHEST 1 VIEW COMPARISON:  10/02/2019 FINDINGS: The patient is mildly rotated to the right. Endotracheal tube terminates slightly inferior to the clavicular heads, well above the carina. Enteric tube courses into the abdomen with tip not imaged. The cardiomediastinal silhouette is unchanged with normal heart size. Mixed interstitial  and airspace opacities, greatest in the lung bases, have not substantially changed allowing for patient rotation and slightly lower lung volumes on today's study. No sizable pleural effusion or pneumothorax is identified. IMPRESSION: Slightly lower lung volumes without significant change in bilateral airspace disease. Electronically Signed   By: Logan Bores M.D.   On: 10/03/2019 09:40   Portable Chest xray  Result Date: 10/02/2019 CLINICAL DATA:  Intubated. EXAM: PORTABLE CHEST 1 VIEW COMPARISON:   10/01/2019 FINDINGS: Endotracheal tube terminates 3 cm above the carina. Enteric tube courses into the abdomen with tip not imaged. The cardiomediastinal silhouette is unchanged with normal heart size. Mixed interstitial and airspace opacities in the right greater than left lungs, greatest in the bases, are unchanged on the left but significantly improved in the right base. No pleural effusion or pneumothorax is identified. IMPRESSION: Bilateral lower lung airspace disease with improved aeration on the right. Electronically Signed   By: Logan Bores M.D.   On: 10/02/2019 09:51   Medications: . sodium chloride    . argatroban 1.45 mcg/kg/min (10/03/19 1036)  . [START ON 10/04/2019] ceFEPime (MAXIPIME) IV    . feeding supplement (VITAL AF 1.2 CAL) 20 mL/hr at 10/03/19 0601   . chlorhexidine gluconate (MEDLINE KIT)  15 mL Mouth Rinse BID  . Chlorhexidine Gluconate Cloth  6 each Topical Daily  . darbepoetin (ARANESP) injection - NON-DIALYSIS  150 mcg Subcutaneous Q Thu-1800  . docusate  100 mg Per Tube BID  . free water  300 mL Per Tube Q6H  . insulin aspart  0-9 Units Subcutaneous Q4H  . mouth rinse  15 mL Mouth Rinse 10 times per day  . pantoprazole sodium  40 mg Per Tube Daily  . polyethylene glycol  17 g Per Tube BID  . sodium zirconium cyclosilicate  10 g Oral BID

## 2019-10-03 NOTE — Plan of Care (Signed)
  Problem: Elimination: Goal: Will not experience complications related to bowel motility Outcome: Progressing   

## 2019-10-03 NOTE — Progress Notes (Signed)
[place] NOTE  Pharmacy Consult for Argatroban + Cefepime Indication: HIT + Pseudomonas PNA  Allergies  Allergen Reactions  . 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)      Patient Measurements: Height: 5\' 8"  (172.7 cm) Weight: 147 lb 14.9 oz (67.1 kg) IBW/kg (Calculated) : 68.4  Vital Signs: Temp: 98.2 F (36.8 C) (01/10 0800) Temp Source: Axillary (01/10 0800) BP: 141/59 (01/10 0800) Pulse Rate: 71 (01/10 0800)  Labs: Recent Labs    10/01/19 0925 10/02/19 0549 10/03/19 0137  HGB 10.3* 8.2* 8.1*  HCT 34.2* 27.4* 26.1*  PLT 125* PLATELET CLUMPS NOTED ON SMEAR, COUNT APPEARS DECREASED 115*  APTT 59* 89* 77*  CREATININE 5.97* 7.52* 8.28*    Estimated Creatinine Clearance: 8.7 mL/min (A) (by C-G formula based on SCr of 8.28 mg/dL (H)).   Assessment: 44 YOM on heparin SQ for VTE prophylaxis with a >50% drop in platelets on day 7 of therapy, concerning for HITT. Heparin was stopped and SRA was positive on 09/22/19.  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 The guidelines are unclear on the intended length of therapy with the patient not having a confirmed thrombosis but could consider continuing anticoagulation for 4 weeks. The patient could eventually transition to oral anticoagulation.   His aPTT this morning remains therapeutic at 77 sec.  No bleeding reported; he is on Aranesp for anemia.  Platelet count is fluctuating.    Pharmacy also consulted to dose cefepime for Pseudomonas PNA.  Patient as acute on chronic AKI and SCr continues to trend up.  CrCL 9 ml/min, afebrile, WBC WNL.  Cefepime was suppressed on culture data, but Pseudomonas is susceptible to it per microbiology.  Azith 12/23>>12/26 CTX 12/23>> 12/29 Unasyn 1/7 >>1/10 Cefepime 1/10 >>  12/23 BCx - negative 12/23 UCx - 80k E.coli (sens CTX) 12/23 MRSA PCR - positive 12/28 covid -  negative 1/8 TA - few PSA (S Cipro, Primaxin, Zosyn - and cefepime per micro) + GPC on Gram stain   Goal of Therapy:  aPTT 50-90 seconds Monitor platelets by anticoagulation protocol: Yes  Resolution of infection   Plan:  Continue argatroban at 1.45 mc/kg/min Daily aPTT and CBC F/U transition to Eliquis as CBC stabilizes  Cefepime 1gm IV Q24H Monitor renal fxn, micro data, clinical progress  Rori Goar D. Mina Marble, PharmD, BCPS, [place] 10/03/2019, 9:22 AM

## 2019-10-04 ENCOUNTER — Inpatient Hospital Stay (HOSPITAL_COMMUNITY): Payer: Medicare Other

## 2019-10-04 DIAGNOSIS — T17908S Unspecified foreign body in respiratory tract, part unspecified causing other injury, sequela: Secondary | ICD-10-CM

## 2019-10-04 LAB — RENAL FUNCTION PANEL
Albumin: 1.7 g/dL — ABNORMAL LOW (ref 3.5–5.0)
Anion gap: 16 — ABNORMAL HIGH (ref 5–15)
BUN: 101 mg/dL — ABNORMAL HIGH (ref 8–23)
CO2: 20 mmol/L — ABNORMAL LOW (ref 22–32)
Calcium: 7.7 mg/dL — ABNORMAL LOW (ref 8.9–10.3)
Chloride: 118 mmol/L — ABNORMAL HIGH (ref 98–111)
Creatinine, Ser: 8.04 mg/dL — ABNORMAL HIGH (ref 0.61–1.24)
GFR calc Af Amer: 7 mL/min — ABNORMAL LOW (ref >60)
GFR calc non Af Amer: 6 mL/min — ABNORMAL LOW (ref >60)
Glucose, Bld: 89 mg/dL (ref 70–99)
Phosphorus: 6.5 mg/dL — ABNORMAL HIGH (ref 2.5–4.6)
Potassium: 4.1 mmol/L (ref 3.5–5.1)
Sodium: 154 mmol/L — ABNORMAL HIGH (ref 135–145)

## 2019-10-04 LAB — TYPE AND SCREEN
ABO/RH(D): O POS
Antibody Screen: NEGATIVE
Unit division: 0
Unit division: 0
Unit division: 0
Unit division: 0

## 2019-10-04 LAB — BPAM RBC
Blood Product Expiration Date: 202102082359
Blood Product Expiration Date: 202102082359
Blood Product Expiration Date: 202102082359
Blood Product Expiration Date: 202102082359
ISSUE DATE / TIME: 202101071034
ISSUE DATE / TIME: 202101071600
Unit Type and Rh: 5100
Unit Type and Rh: 5100
Unit Type and Rh: 5100
Unit Type and Rh: 5100

## 2019-10-04 LAB — CBC
HCT: 31.1 % — ABNORMAL LOW (ref 39.0–52.0)
Hemoglobin: 9.3 g/dL — ABNORMAL LOW (ref 13.0–17.0)
MCH: 29.8 pg (ref 26.0–34.0)
MCHC: 29.9 g/dL — ABNORMAL LOW (ref 30.0–36.0)
MCV: 99.7 fL (ref 80.0–100.0)
Platelets: 132 10*3/uL — ABNORMAL LOW (ref 150–400)
RBC: 3.12 MIL/uL — ABNORMAL LOW (ref 4.22–5.81)
RDW: 14.6 % (ref 11.5–15.5)
WBC: 7 10*3/uL (ref 4.0–10.5)
nRBC: 0 % (ref 0.0–0.2)

## 2019-10-04 LAB — GLUCOSE, CAPILLARY
Glucose-Capillary: 101 mg/dL — ABNORMAL HIGH (ref 70–99)
Glucose-Capillary: 105 mg/dL — ABNORMAL HIGH (ref 70–99)
Glucose-Capillary: 128 mg/dL — ABNORMAL HIGH (ref 70–99)
Glucose-Capillary: 129 mg/dL — ABNORMAL HIGH (ref 70–99)
Glucose-Capillary: 79 mg/dL (ref 70–99)
Glucose-Capillary: 81 mg/dL (ref 70–99)

## 2019-10-04 LAB — CULTURE, RESPIRATORY W GRAM STAIN

## 2019-10-04 LAB — APTT: aPTT: 73 seconds — ABNORMAL HIGH (ref 24–36)

## 2019-10-04 MED ORDER — SODIUM ZIRCONIUM CYCLOSILICATE 10 G PO PACK
10.0000 g | PACK | Freq: Every day | ORAL | Status: DC
  Filled 2019-10-04: qty 1

## 2019-10-04 MED ORDER — ORAL CARE MOUTH RINSE
15.0000 mL | Freq: Two times a day (BID) | OROMUCOSAL | Status: DC
  Administered 2019-10-04 – 2019-10-23 (×32): 15 mL via OROMUCOSAL

## 2019-10-04 MED ORDER — SODIUM CHLORIDE 0.9 % IV SOLN
500.0000 mg | INTRAVENOUS | Status: AC
  Administered 2019-10-04 – 2019-10-10 (×7): 500 mg via INTRAVENOUS
  Filled 2019-10-04 (×2): qty 500
  Filled 2019-10-04: qty 0.5
  Filled 2019-10-04 (×3): qty 500
  Filled 2019-10-04: qty 0.5

## 2019-10-04 MED ORDER — DEXTROSE 5 % IV SOLN: INTRAVENOUS | Status: DC

## 2019-10-04 MED ORDER — PNEUMOCOCCAL VAC POLYVALENT 25 MCG/0.5ML IJ INJ
0.5000 mL | INJECTION | INTRAMUSCULAR | Status: AC
  Administered 2019-10-23: 0.5 mL via INTRAMUSCULAR
  Filled 2019-10-04 (×5): qty 0.5

## 2019-10-04 NOTE — Progress Notes (Signed)
[facility] Progress Note    Assessment/Plan: **CKD 5:  Baseline Cr ~5 now with AKI secondary to acute hypoxic event 1/8, cr up to 8.28.   UOP was down but back up today.  No current absolute indications for RRT but may develop in the coming days.  Dr. Johnney Ou has contacted pts mother, she consents to him getting dialysis if needed. Now I have talked with the brother as well.  Unfortunately I feel will not be a good HD patient- will likely be non compliant unless in a facility  **Acute hypoxic resp failure: secondary to aspiration, intubation; per PCCM.  Now extubated  **Hyperkalemia: resolved- will dec lokelma to daily   **Hypernatremia:  On free water - total of 1200 per 24 hours -  Needs some D5 as well - have ordered   **Anemia: improved following transfusion 1/7.  Iron replete 1 week ago. On ESA, dose increased on 1/7.  **HTN: normotensive to  hypertensive, no pressors.   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 No asterixis  Additional Objective Labs: Basic Metabolic Panel: Recent Labs  Lab 10/02/19 0549 10/03/19 0137 10/04/19 0536  NA 149* 149* 154*  K 4.9 4.5 4.1  CL 115* 118* 118*  CO2 21* 18* 20*  GLUCOSE 113* 91 89  BUN 94* 98* 101*  CREATININE 7.52* 8.28* 8.04*  CALCIUM 7.4* 7.6* 7.7*  PHOS 6.3* 7.1* 6.5*   Liver Function Tests: Recent Labs  Lab  10/02/19 0549 10/03/19 0137 10/04/19 0536  ALBUMIN 1.6* 1.6* 1.7*   No results for input(s): LIPASE, AMYLASE in the last 168 hours. CBC: Recent Labs  Lab 09/29/19 0332 09/30/19 0405 10/01/19 0925 10/02/19 0549 10/03/19 0137 10/04/19 0536  WBC 9.7 6.6 2.9* 5.8 7.1 7.0  NEUTROABS 8.0*  --   --   --   --   --   HGB 7.2* 6.6* 10.3* 8.2* 8.1* 9.3*  HCT 24.0* 21.9* 34.2* 27.4* 26.1* 31.1*  MCV 101.3* 101.4* 100.3* 101.5* 98.9 99.7  PLT 160 144* 125* PLATELET CLUMPS NOTED ON SMEAR, COUNT APPEARS DECREASED 115* 132*   Blood Culture    Component Value Date/Time   SDES TRACHEAL ASPIRATE 10/01/2019 1210   SPECREQUEST NONE 10/01/2019 1210   CULT  10/01/2019 1210    FEW PSEUDOMONAS AERUGINOSA FEW ENTEROBACTER CLOACAE    REPTSTATUS 10/04/2019 FINAL 10/01/2019 1210    Cardiac Enzymes: No results for input(s): CKTOTAL, CKMB, CKMBINDEX, TROPONINI in the last 168 hours. CBG: Recent Labs  Lab 10/03/19 1220 10/03/19 1615 10/03/19 2313 10/04/19 0336 10/04/19 0825  GLUCAP 96 87 85 81 79   Iron Studies: No results for input(s): IRON, TIBC, TRANSFERRIN, FERRITIN in the last 72 hours. '@[email or web address]' @ Studies/Results: DG Chest Port 1 View  Result Date: 10/04/2019 CLINICAL DATA:  Respiratory failure intubation EXAM: PORTABLE CHEST 1 VIEW COMPARISON:  October 03, 2019 FINDINGS: The heart size and mediastinal contours are within normal limits. ETT is 2.4 cm above the carina. NG tube is seen coursing below the diaphragm. There is slight interval improvement in the increased interstitial markings at both lung bases. No acute osseous abnormality. IMPRESSION: Slight interval improvement in the interstitial opacities at both lung bases. Electronically Signed   By: Prudencio Pair M.D.   On: 10/04/2019 06:24   DG CHEST PORT 1 VIEW  Result Date: 10/03/2019 CLINICAL DATA:  Respiratory failure. EXAM: PORTABLE CHEST 1 VIEW COMPARISON:  10/02/2019 FINDINGS: The patient is mildly rotated to the right.  Endotracheal tube terminates slightly inferior to the clavicular heads, well above the carina. Enteric tube courses into the abdomen with tip not imaged. The cardiomediastinal silhouette is unchanged with normal heart size. Mixed interstitial and airspace opacities, greatest in the lung bases, have not substantially changed allowing for patient rotation and slightly lower lung volumes on today's study. No sizable pleural effusion or pneumothorax is identified. IMPRESSION: Slightly lower lung volumes without significant change in bilateral airspace disease. Electronically Signed   By: Logan Bores M.D.   On: 10/03/2019 09:40   DG Abd Portable 1V  Result Date: 10/04/2019 CLINICAL DATA:  Ileus EXAM: PORTABLE ABDOMEN - 1 VIEW COMPARISON:  October 01, 2019 FINDINGS: NG tube is seen within the first portion of the duodenum. Contrast is seen in nondilated loops of bowel to the level of the rectum. Air seen in nondilated bowel in the mid abdomen. It no acute osseous abnormality. IMPRESSION: Nonobstructive bowel gas pattern. Electronically Signed   By: Prudencio Pair M.D.   On: 10/04/2019 06:21   Medications: . sodium chloride 250 mL (10/04/19 0918)  . argatroban 1.45 mcg/kg/min (10/04/19 0800)  . ceFEPime (MAXIPIME) IV 1 g (10/04/19 0919)  . feeding supplement (VITAL AF 1.2 CAL) 20 mL/hr at 10/03/19 0601   . chlorhexidine gluconate (MEDLINE KIT)  15 mL Mouth Rinse BID  . Chlorhexidine Gluconate Cloth  6 each Topical Daily  . darbepoetin (ARANESP) injection - NON-DIALYSIS  150 mcg Subcutaneous Q Thu-1800  . docusate  100 mg Per Tube BID  . free water  300 mL Per Tube Q6H  . insulin aspart  0-9 Units Subcutaneous Q4H  . mouth rinse  15 mL Mouth Rinse q12n4p  . pantoprazole sodium  40 mg Per Tube Daily  . polyethylene glycol  17 g Per Tube BID  . sodium zirconium cyclosilicate  10 g Oral BID

## 2019-10-04 NOTE — Evaluation (Signed)
Clinical/Bedside Swallow Evaluation Patient Details  Name: Ryan Villa MRN: [id number] Date of Birth: 1956/02/24  Today's Date: 10/04/2019 Time: SLP Start Time (ACUTE ONLY): 1155 SLP Stop Time (ACUTE ONLY): 1211 SLP Time Calculation (min) (ACUTE ONLY): 16 min  Past Medical History: History reviewed. No pertinent past medical history. Past Surgical History:  Past Surgical History:  Procedure Laterality Date  . REVERSE SHOULDER ARTHROPLASTY Right 09/28/2019   Procedure: RIGHT REVERSE SHOULDER ARTHROPLASTY;  Surgeon: Nicholes Stairs, MD;  Location: [facility];  Service: Orthopedics;  Laterality: Right;   HPI:  64 year old male transferred from [facility] for acute respiratory failure, rhabdomyolysis (fell and laid on floor 3 days), acute kidney injury. PMH: CKD stage IV, diabetes mellitus type 2, hyperlipidemia, hypertension, left carotid stenosis, intracerebral hemorrhage, status post right BKA. Per chart EMS reported that the patient's sister was bringing him food while he was lying on the floor during that 3-day period. Intubated 12/23-12/24 (self extubated). Found to have right humeral neck fracture and right upper lobe consolidation per chest x-ray, head CT no acute intracranial process,   Pt underwent MBS 12/28 with findings of severe dysphagia - asp of nectar/honey thick.  Repeat MBS 1/2 recommended Dys 1 diet and nectar thick liquids with chin tuck. On 1/8 pt had vomiting and suspected aspiration requiring reintubation; extuabted 1/11.   Assessment / Plan / Recommendation Clinical Impression  Pt appears to have an exacerbation of his post-extubation dysphagia after having been reintubated for several days. He also has a large bore NGT that could be contributing. He has minimal phonation (almost completely aphonic) and at last swallow evaluation he was documented as having normal voval quality. Pt needs Mod-Max cues to utilize a chin tuck and there are strong cough responses to ice chips  and small spoonfuls of nectar thick liquids. He will likely need repeated instrumental testing, but I also think he would benefit from a little time first. Will f/u for improvements in vocal quality and clinical presentation before scheduling.  SLP Visit Diagnosis: Dysphagia, oropharyngeal phase (R13.12)    Aspiration Risk  Severe aspiration risk;Moderate aspiration risk    Diet Recommendation NPO;Alternative means - temporary   Medication Administration: Via alternative means    Other  Recommendations Oral Care Recommendations: Oral care QID Other Recommendations: Have oral suction available   Follow up Recommendations Skilled Nursing facility      Frequency and Duration min 2x/week  2 weeks       Prognosis Prognosis for Safe Diet Advancement: Good Barriers to Reach Goals: Cognitive deficits      Swallow Study   General HPI: 64 year old male transferred from [facility] for acute respiratory failure, rhabdomyolysis (fell and laid on floor 3 days), acute kidney injury. PMH: CKD stage IV, diabetes mellitus type 2, hyperlipidemia, hypertension, left carotid stenosis, intracerebral hemorrhage, status post right BKA. Per chart EMS reported that the patient's sister was bringing him food while he was lying on the floor during that 3-day period. Intubated 12/23-12/24 (self extubated). Found to have right humeral neck fracture and right upper lobe consolidation per chest x-ray, head CT no acute intracranial process,   Pt underwent MBS 12/28 with findings of severe dysphagia - asp of nectar/honey thick.  Repeat MBS 1/2 recommended Dys 1 diet and nectar thick liquids with chin tuck. On 1/8 pt had vomiting and suspected aspiration requiring reintubation; extuabted 1/11. Type of Study: Bedside Swallow Evaluation Previous Swallow Assessment: see HPI Diet Prior to this Study: NPO;NG Tube Temperature Spikes Noted: No Respiratory  Status: Nasal cannula History of Recent Intubation: Yes Length  of Intubations (days): 6 days(across 2 intubations) Date extubated: 10/04/19 Behavior/Cognition: Alert;Cooperative;Requires cueing;Confused Oral Cavity Assessment: Dry Oral Care Completed by SLP: Yes Oral Cavity - Dentition: Poor condition;Missing dentition Patient Positioning: Upright in bed Baseline Vocal Quality: Hoarse;Aphonic Volitional Cough: Weak;Congested Volitional Swallow: Able to elicit    Oral/Motor/Sensory Function Overall Oral Motor/Sensory Function: Within functional limits   Ice Chips Ice chips: Impaired Presentation: Spoon Pharyngeal Phase Impairments: Cough - Immediate   Thin Liquid Thin Liquid: Not tested    Nectar Thick Nectar Thick Liquid: Impaired Presentation: Spoon Oral phase functional implications: Prolonged oral transit Pharyngeal Phase Impairments: Cough - Immediate   Honey Thick Honey Thick Liquid: Not tested   Puree Puree: Not tested   Solid     Solid: Not tested      Osie Bond., M.A. [place] Pager [phone] Office [phone]  10/04/2019,1:11 PM

## 2019-10-04 NOTE — Progress Notes (Signed)
[place] for Argatroban  Indication: HIT   Allergies  Allergen Reactions  . 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 Heparin was stopped and SRA was positive on 09/22/19.  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 The guidelines are unclear on the intended length of therapy with the patient not having a confirmed thrombosis but could consider continuing anticoagulation for 4 weeks. The patient could eventually transition to oral anticoagulation.   His aPTT this morning remains therapeutic at 73 sec.  No bleeding reported; he is on Aranesp for anemia.  Platelet count is fluctuating.  Goal of Therapy:  aPTT 50-90 seconds Monitor platelets by anticoagulation protocol: Yes    Plan:  Continue argatroban at 1.45 mc/kg/min Daily aPTT and CBC F/U transition to Eliquis as CBC stabilizes  Alanda Slim, PharmD, [facility] Clinical Pharmacist Please see AMION for all Pharmacists' Contact Phone Numbers 10/04/2019, 7:38 AM

## 2019-10-04 NOTE — Progress Notes (Addendum)
NAME:  Ryan Villa, MRN:  [id number], DOB:  1956-02-16, LOS: 54 ADMISSION DATE:  09/15/2019, CONSULTATION DATE:  09/15/2019 REFERRING MD:  [facility] CHIEF COMPLAINT: Respiratory failure, rhabdomyolysis, acute kidney injury  Brief History   64 year old male transferred from [facility] 12/23 for acute respiratory failure, rhabdomyolysis, acute kidney injury due to services unavailable at that facility.  Self extubated 12/25.  Taken to OR for right shoulder surgery 1/5.  Had vomiting with probable aspiration early AM 1/87, PCCM re-consulted.  History of present illness   This is a 64 year old male past medical history CKD stage IV, diabetes mellitus type 2, hyperlipidemia, hypertension, left carotid stenosis, intracerebral hemorrhage, status post right BKA presented to [facility] emergency department after apparently falling at his home 3 days ago, apparently lying on the floor since that time.  EMS reported that the patient's sister was bringing him food while he was lying on the floor during that 3-day period. He is status post recent right BKA and uses a prosthetic and is supposed to use a walker but is noncompliant with his per family.  Reportedly not using his walker when he fell and landed on his right side.  Unclear if there was loss of consciousness.  At that time the patient did not want his sister to call 911.  On 12/23 she called 911 because she noticed that he was having a hard time breathing and bruising on his right shoulder.  On arrival to [facility] ED the patient was able to answer questions with difficulty in understanding his speech.  He was hypoxic on room air and placed on nonrebreather, noted to appear severely ill appearing and tachypneic.  He was also noted to be covered in dried stool/urine.  He complained of right shoulder pain.  Work-up and findings significant for hypothermia with a temperature of 93.9 F, significant ecchymosis over the right shoulder area, hypotensive, given IV  fluids.  He progressed to have worsening respiratory distress, hypotension, became unresponsive and subsequently intubated, central line placed.  He had significant metabolic derangements including sodium 149, potassium 6.8, BUN 172, creatinine 10.5.  CK 2411. Procalcitonin 0.61.  UDS negative.  pH less than 7 per ABG report.  He responded fairly well to IV fluids and briefly required pressor support.  Repeat labs showed minimal improvement in metabolic derangements.  He had some urine output, milky white in color.  Noted right humeral neck fracture on x-ray per ER paper chart, right upper lobe consolidation per chest x-ray, head CT no acute intracranial process, Diffuse parasinus inflammation.   He self extubated 12/25.  Transferred out of ICU 12/27. Taken for right shoulder reverse arthroplasty 1/5.  Early AM 1/8 had vomiting with probable aspiration.  Placed on NRB and transferred to ICU for intubation 10/01/2019   Past Medical History  Diabetes mellitus type 2 CKD stage IV Hyperlipidemia Hypertension Left carotid gnosis Intracerebral hemorrhage Status post right [facility]   12/23 admitted  12/25 self extubated 12/27 transferred out of ICU 1/2 NG removed and started on dysphagia 1 diet 1/5 right reverse shoulder arthroplasty 1/6 advanced to dysphagia 3 diet 1/8 vomited multiple times.  Presumed aspiration.  Transferred to ICU for possible intubation  Consults:  Nephrology Orthopedics  Procedures:  NA  Significant Diagnostic Tests:  CXR 1/8 > aspiration  Micro Data:  SARS Coronavirus 2 negative per [facility] paper chart Sputum 1/08 >> Pseudomonas aeurginosa  Antimicrobials:  Ceftriaxone 1 dose 12/23 OSH>> 1220 Azithromycin 1 dose 12/23 OSH>>12/26 Unasyn 1/8 >  1/10 Fortaz 1/10 >>   Interim history/subjective:  No events overnight Tolerating weaning this AM  Objective   Blood pressure (!) 176/72, pulse 65, temperature 98.1 F (36.7 C),  temperature source Oral, resp. rate 18, height 5\' 8"  (1.727 m), weight 61.9 kg, SpO2 100 %.    Vent Mode: PRVC FiO2 (%):  [40 %-50 %] 40 % Set Rate:  [18 bmp] 18 bmp Vt Set:  [460 mL] 460 mL PEEP:  [5 cmH20] 5 cmH20 Plateau Pressure:  [12 cmH20-13 cmH20] 12 cmH20   Intake/Output Summary (Last 24 hours) at 10/04/2019 0802 Last data filed at 10/04/2019 0000 Gross per 24 hour  Intake 196.99 ml  Output 1200 ml  Net -1003.01 ml   Filed Weights   10/01/19 0628 10/02/19 0500 10/04/19 0500  Weight: 66.2 kg 67.1 kg 61.9 kg    Examination:  General - Acute on chronically ill appearing male, NAD HEENT: [place]/AT, PERRL, EOM-I and MMM, ETT is in place Cardiac - RRR, Nl S1/S2 and -M/R/G Chest - CTA bilaterally Abdomen - Soft, NT, ND and +BS Extremities - Rt BKA Skin - Rt arm in wrap Neuro - RASS -1  I reviewed CXR myself, ETT is in a good position, infiltrate noted  Discussed with bedside RN    Rhabdomyolysis, Hyperkalemia, E coli UTI   Assessment & Plan:   Acute hypoxic respiratory failure from Pseudomonal HCAP. - Wean to extubate today - CXR to PRN at this point - Continue cefepime alone - SLP - OOB - PT/OT - IS - Flutter valve  Vomiting. Most likely 2/2 to retained barium in the colon. Dysphagia. Severe protein calorie malnutrition. - PRN Zofran as ordered - Bowel regimen - D/C TF and SLP, advance diet as tolerated - F/u abd xray  CKD 5. Hypernatremia. - Renal following  Anemia of critical illness and chronic disease. - F/u CBC - Transfuse for Hb < 7 or significant bleeding  Hyperglycemia. - Change SSI to q4hrs.  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 - Continue argatroban - Will eventually need to transition to eliquis when more stable  Hx HTN. - D/c norvasc. - BP stable at present - May need to restart home anti-HTN post extubation but will monitor for now  Right proximal humerus fracture - s/p reverse shoulder arthroplasty 1/5. - Post op care per ortho. - PT /  OT.  Patient appears well.  Will transfer to progressive care and to [facility] service with PCCM off 1/12  Best practice:  Diet: N.p.o. DVT prophylaxis: Argatroban GI prophylaxis: protonix Mobility: Bedrest Code Status: Full Disposition: Transfer to ICU.  Labs:   CMP Latest Ref Rng & Units 10/04/2019 10/03/2019 10/02/2019  Glucose 70 - 99 mg/dL 89 91 113(H)  BUN 8 - 23 mg/dL 101(H) 98(H) 94(H)  Creatinine 0.61 - 1.24 mg/dL 8.04(H) 8.28(H) 7.52(H)  Sodium 135 - 145 mmol/L 154(H) 149(H) 149(H)  Potassium 3.5 - 5.1 mmol/L 4.1 4.5 4.9  Chloride 98 - 111 mmol/L 118(H) 118(H) 115(H)  CO2 22 - 32 mmol/L 20(L) 18(L) 21(L)  Calcium 8.9 - 10.3 mg/dL 7.7(L) 7.6(L) 7.4(L)  Total Protein 6.5 - 8.1 g/dL - - -  Total Bilirubin 0.3 - 1.2 mg/dL - - -  Alkaline Phos 38 - 126 U/L - - -  AST 15 - 41 U/L - - -  ALT 0 - 44 U/L - - -    CBC Latest Ref Rng & Units 10/04/2019 10/03/2019 10/02/2019  WBC 4.0 - 10.5 K/uL 7.0 7.1 5.8  Hemoglobin  13.0 - 17.0 g/dL 9.3(L) 8.1(L) 8.2(L)  Hematocrit 39.0 - 52.0 % 31.1(L) 26.1(L) 27.4(L)  Platelets 150 - 400 K/uL 132(L) 115(L) PLATELET CLUMPS NOTED ON SMEAR, COUNT APPEARS DECREASED    ABG    Component Value Date/Time   PHART 7.430 10/01/2019 0849   PCO2ART 37.3 10/01/2019 0849   PO2ART 239.0 (H) 10/01/2019 0849   HCO3 24.5 10/01/2019 0849   TCO2 26 10/01/2019 0849   ACIDBASEDEF 2.9 (H) 10/01/2019 0540   O2SAT 100.0 10/01/2019 0849    CBG (last 3)  Recent Labs    10/03/19 1615 10/03/19 2313 10/04/19 0336  GLUCAP 87 85 81   The patient is critically ill with multiple organ systems failure and requires high complexity decision making for assessment and support, frequent evaluation and titration of therapies, application of advanced monitoring technologies and extensive interpretation of multiple databases.   Critical Care Time devoted to patient care services described in this note is  31  Minutes. This time reflects time of care of this signee Dr Jennet Maduro. This critical care time does not reflect procedure time, or teaching time or supervisory time of PA/NP/Med student/Med Resident etc but could involve care discussion time.  Rush Farmer, M.D. [facility] Pulmonary/Critical Care Medicine.

## 2019-10-04 NOTE — Progress Notes (Signed)
Pharmacy Antibiotic Note  Ryan Villa is a 64 y.o. male admitted on 09/15/2019 with pneumonia.  Pharmacy has been consulted for Meropenem dosing.  ID:  PNA (vomited 1/8, poss. aspiration, intubated),  Afebrile, WBC WNL. Escalate abx for HCAP with Pseudomonas  Azith 12/23>>12/26 CTX 12/23>> 12/29 Unasyn 1/7 >>1/10 Cefepime 1/10>>1/11 Meropenem 1/11>>  12/23 BCx - negative 12/23 UCx - 80k E.coli (sens CTX) 12/23 MRSA PCR - positive 12/28 covid - negative 1/8 TA - few PSA and Enterobacter  Plan: Change Cefepime to Meropenem 500mg  IV q24h   Height: 5\' 8"  (172.7 cm) Weight: 136 lb 7.4 oz (61.9 kg) IBW/kg (Calculated) : 68.4  Temp (24hrs), Avg:97.9 F (36.6 C), Min:97.4 F (36.3 C), Max:98.7 F (37.1 C)  Recent Labs  Lab 09/30/19 0405 10/01/19 0925 10/02/19 0549 10/03/19 0137 10/04/19 0536  WBC 6.6 2.9* 5.8 7.1 7.0  CREATININE 5.03* 5.97* 7.52* 8.28* 8.04*    Estimated Creatinine Clearance: 8.2 mL/min (A) (by C-G formula based on SCr of 8.04 mg/dL (H)).    Allergies  Allergen Reactions  . 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

## 2019-10-05 DIAGNOSIS — E43 Unspecified severe protein-calorie malnutrition: Secondary | ICD-10-CM

## 2019-10-05 LAB — CBC
HCT: 36.1 % — ABNORMAL LOW (ref 39.0–52.0)
Hemoglobin: 10.8 g/dL — ABNORMAL LOW (ref 13.0–17.0)
MCH: 30.3 pg (ref 26.0–34.0)
MCHC: 29.9 g/dL — ABNORMAL LOW (ref 30.0–36.0)
MCV: 101.4 fL — ABNORMAL HIGH (ref 80.0–100.0)
Platelets: 142 10*3/uL — ABNORMAL LOW (ref 150–400)
RBC: 3.56 MIL/uL — ABNORMAL LOW (ref 4.22–5.81)
RDW: 14.1 % (ref 11.5–15.5)
WBC: 4.3 10*3/uL (ref 4.0–10.5)
nRBC: 0 % (ref 0.0–0.2)

## 2019-10-05 LAB — MAGNESIUM: Magnesium: 2 mg/dL (ref 1.7–2.4)

## 2019-10-05 LAB — RENAL FUNCTION PANEL
Albumin: 1.8 g/dL — ABNORMAL LOW (ref 3.5–5.0)
Anion gap: 14 (ref 5–15)
BUN: 88 mg/dL — ABNORMAL HIGH (ref 8–23)
CO2: 24 mmol/L (ref 22–32)
Calcium: 7.6 mg/dL — ABNORMAL LOW (ref 8.9–10.3)
Chloride: 114 mmol/L — ABNORMAL HIGH (ref 98–111)
Creatinine, Ser: 7.18 mg/dL — ABNORMAL HIGH (ref 0.61–1.24)
GFR calc Af Amer: 9 mL/min — ABNORMAL LOW (ref >60)
GFR calc non Af Amer: 7 mL/min — ABNORMAL LOW (ref >60)
Glucose, Bld: 157 mg/dL — ABNORMAL HIGH (ref 70–99)
Phosphorus: 5.8 mg/dL — ABNORMAL HIGH (ref 2.5–4.6)
Potassium: 3.7 mmol/L (ref 3.5–5.1)
Sodium: 152 mmol/L — ABNORMAL HIGH (ref 135–145)

## 2019-10-05 LAB — GLUCOSE, CAPILLARY
Glucose-Capillary: 135 mg/dL — ABNORMAL HIGH (ref 70–99)
Glucose-Capillary: 153 mg/dL — ABNORMAL HIGH (ref 70–99)
Glucose-Capillary: 123 mg/dL — ABNORMAL HIGH (ref 70–99)
Glucose-Capillary: 124 mg/dL — ABNORMAL HIGH (ref 70–99)
Glucose-Capillary: 139 mg/dL — ABNORMAL HIGH (ref 70–99)

## 2019-10-05 LAB — APTT: aPTT: 69 seconds — ABNORMAL HIGH (ref 24–36)

## 2019-10-05 MED ORDER — OSMOLITE 1.2 CAL PO LIQD
1000.0000 mL | ORAL | Status: DC
  Administered 2019-10-05 – 2019-10-08 (×4): 1000 mL
  Filled 2019-10-05 (×8): qty 1000

## 2019-10-05 MED ORDER — FREE WATER
300.0000 mL | Status: DC
  Administered 2019-10-05 – 2019-10-23 (×105): 300 mL

## 2019-10-05 NOTE — Progress Notes (Signed)
[facility] Progress Note    Assessment/Plan: **CKD 5:  Baseline Cr ~5 - AKI secondary to acute hypoxic event 1/8, cr up to 8.28.   UOP was down but back up today.  No current absolute indications for RRT seems to be trending back toward his baseline again.  Dr. Johnney Ou has contacted pts mother, she consents to him getting dialysis if needed. Now I have talked with the brother as well.  Unfortunately I feel he will not be a good HD patient- will likely be non compliant unless in a facility  **Acute hypoxic resp failure: secondary to aspiration, intubation; per PCCM.  Now extubated  **Hyperkalemia: resolved- will stop lokelma   **Hypernatremia:  On free water - total of 1200 per 24 hours -  Needs some D5 at 75 per hour.  He pulled his NGT-  Is due to get coretrak today- have inc the frequency of his free water boluses  **Anemia: improved following transfusion 1/7.  Iron replete 1 week ago. On ESA, dose increased on 1/7.  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  Working with PT  Objective Vitals:   10/05/19 0500 10/05/19 0600 10/05/19 0700 10/05/19 0800  BP: (!) 149/67 (!) 137/103 (!) 150/64 111/60  Pulse: (!) 58 66 (!) 56 (!) 57  Resp: 18 (!) '21 16 19  ' Temp:    (!) 97.5 F (36.4 C)  TempSrc:    Axillary  SpO2: 97% 91% 96% 92%  Weight:      Height:       Physical Exam General: slow MS -  Trouble with voice but seems to understand what I am saying Heart: no rub Lungs: vent, clear ant and laterally Abdomen: soft, thin Extremities: no edema Neuro: nonfocal. No asterixis  Additional Objective Labs: Basic Metabolic Panel: Recent Labs  Lab 10/03/19 0137 10/04/19 0536 10/05/19 0549  NA 149* 154* 152*  K 4.5 4.1 3.7  CL 118* 118* 114*   CO2 18* 20* 24  GLUCOSE 91 89 157*  BUN 98* 101* 88*  CREATININE 8.28* 8.04* 7.18*  CALCIUM 7.6* 7.7* 7.6*  PHOS 7.1* 6.5* 5.8*   Liver Function Tests: Recent Labs  Lab 10/03/19 0137 10/04/19 0536 10/05/19 0549  ALBUMIN 1.6* 1.7* 1.8*   No results for input(s): LIPASE, AMYLASE in the last 168 hours. CBC: Recent Labs  Lab 09/29/19 0332 10/01/19 0925 10/02/19 0549 10/03/19 0137 10/04/19 0536 10/05/19 0549  WBC 9.7 2.9* 5.8 7.1 7.0 4.3  NEUTROABS 8.0*  --   --   --   --   --   HGB 7.2* 10.3* 8.2* 8.1* 9.3* 10.8*  HCT 24.0* 34.2* 27.4* 26.1* 31.1* 36.1*  MCV 101.3* 100.3* 101.5* 98.9 99.7 101.4*  PLT 160 125* PLATELET CLUMPS NOTED ON SMEAR, COUNT APPEARS DECREASED 115* 132* 142*   Blood Culture    Component Value Date/Time   SDES TRACHEAL ASPIRATE 10/01/2019 1210   SPECREQUEST NONE 10/01/2019 1210   CULT  10/01/2019 1210    FEW PSEUDOMONAS AERUGINOSA FEW ENTEROBACTER CLOACAE    REPTSTATUS 10/04/2019 FINAL 10/01/2019 1210    Cardiac Enzymes: No results for input(s): CKTOTAL, CKMB, CKMBINDEX, TROPONINI in the last 168 hours. CBG: Recent Labs  Lab 10/04/19 2002 10/04/19 2340 10/05/19 0341 10/05/19 0811 10/05/19 1136  GLUCAP 128* 129*  135* 153* 123*   Iron Studies: No results for input(s): IRON, TIBC, TRANSFERRIN, FERRITIN in the last 72 hours. '@[email or web address]' @ Studies/Results: DG Chest Port 1 View  Result Date: 10/04/2019 CLINICAL DATA:  Respiratory failure intubation EXAM: PORTABLE CHEST 1 VIEW COMPARISON:  October 03, 2019 FINDINGS: The heart size and mediastinal contours are within normal limits. ETT is 2.4 cm above the carina. NG tube is seen coursing below the diaphragm. There is slight interval improvement in the increased interstitial markings at both lung bases. No acute osseous abnormality. IMPRESSION: Slight interval improvement in the interstitial opacities at both lung bases. Electronically Signed   By: Prudencio Pair M.D.   On: 10/04/2019 06:24   DG  Abd Portable 1V  Result Date: 10/04/2019 CLINICAL DATA:  Ileus EXAM: PORTABLE ABDOMEN - 1 VIEW COMPARISON:  October 01, 2019 FINDINGS: NG tube is seen within the first portion of the duodenum. Contrast is seen in nondilated loops of bowel to the level of the rectum. Air seen in nondilated bowel in the mid abdomen. It no acute osseous abnormality. IMPRESSION: Nonobstructive bowel gas pattern. Electronically Signed   By: Prudencio Pair M.D.   On: 10/04/2019 06:21   Medications: . sodium chloride Stopped (10/04/19 1457)  . argatroban 1.45 mcg/kg/min (10/05/19 0800)  . dextrose 75 mL/hr at 10/05/19 0800  . feeding supplement (VITAL AF 1.2 CAL) 20 mL/hr at 10/03/19 0601  . meropenem (MERREM) IV Stopped (10/04/19 1444)   . chlorhexidine gluconate (MEDLINE KIT)  15 mL Mouth Rinse BID  . Chlorhexidine Gluconate Cloth  6 each Topical Daily  . darbepoetin (ARANESP) injection - NON-DIALYSIS  150 mcg Subcutaneous Q Thu-1800  . docusate  100 mg Per Tube BID  . free water  300 mL Per Tube Q6H  . insulin aspart  0-9 Units Subcutaneous Q4H  . mouth rinse  15 mL Mouth Rinse q12n4p  . pantoprazole sodium  40 mg Per Tube Daily  . pneumococcal 23 valent vaccine  0.5 mL Intramuscular Tomorrow-1000  . polyethylene glycol  17 g Per Tube BID  . sodium zirconium cyclosilicate  10 g Oral Daily

## 2019-10-05 NOTE — Progress Notes (Signed)
[place] for Argatroban  Indication: HIT   Allergies  Allergen Reactions  . 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 Heparin was stopped, and SRA was positive on 09/22/19.  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 The guidelines are unclear on the intended length of therapy with the patient not having a confirmed thrombosis, but could consider continuing anticoagulation for 4 weeks. The patient could eventually transition to oral anticoagulation.   aPTT this morning remains therapeutic at 69 sec. No bleeding reported; he is on Aranesp for anemia - Hg up to 10.8. Platelet count is fluctuating, increased back up to 142 today.  Goal of Therapy:  aPTT 50-90 seconds Monitor platelets by anticoagulation protocol: Yes    Plan:  Continue argatroban at 1.45 mc/kg/min Monitor daily aPTT and CBC, s/sx bleeding F/U transition to Eliquis as CBC stabilizes   Elicia Lamp, PharmD, BCPS Please check AMION for all [place] contact numbers Clinical Pharmacist 10/05/2019 11:13 AM

## 2019-10-05 NOTE — Progress Notes (Signed)
  Speech Language Pathology Treatment: Dysphagia  Patient Details Name: Ryan Villa MRN: [id number] DOB: Feb 15, 1956 Today's Date: 10/05/2019 Time: [id number] SLP Time Calculation (min) (ACUTE ONLY): 8 min  Assessment / Plan / Recommendation Clinical Impression  Pt appears mildly improved from yesterdays report, but immediate weak coughing still occurred after ice chip and 2 bites of puree. Pt expected to have a gradual recovery over the next days and is still significantly impaired. Recommend a Cortrak today with f/u tomorrow for instrumental testing as pt is eager to begin PO.    HPI HPI: 64 year old male transferred from [facility] for acute respiratory failure, rhabdomyolysis (fell and laid on floor 3 days), acute kidney injury. PMH: CKD stage IV, diabetes mellitus type 2, hyperlipidemia, hypertension, left carotid stenosis, intracerebral hemorrhage, status post right BKA. Per chart EMS reported that the patient's sister was bringing him food while he was lying on the floor during that 3-day period. Intubated 12/23-12/24 (self extubated). Found to have right humeral neck fracture and right upper lobe consolidation per chest x-ray, head CT no acute intracranial process,   Pt underwent MBS 12/28 with findings of severe dysphagia - asp of nectar/honey thick.  Repeat MBS 1/2 recommended Dys 1 diet and nectar thick liquids with chin tuck. On 1/8 pt had vomiting and suspected aspiration requiring reintubation; extuabted 1/11.      SLP Plan  MBS       Recommendations                   Oral Care Recommendations: Oral care QID Follow up Recommendations: Skilled Nursing facility SLP Visit Diagnosis: Dysphagia, oropharyngeal phase (R13.12) Plan: MBS       GO                Lissete Maestas, Katherene Ponto 10/05/2019, 11:57 AM

## 2019-10-05 NOTE — Procedures (Signed)
Cortrak  Person Inserting Tube:  Jacklynn Barnacle E, RD Tube Type:  Cortrak - 43 inches Tube Location:  Left nare Initial Placement:  Stomach Secured by: Bridle Technique Used to Measure Tube Placement:  Documented cm marking at nare/ corner of mouth Cortrak Secured At:  68 cm    Cortrak Tube Team Note:  Consult received to place a Cortrak feeding tube.   No x-ray is required. RN may begin using tube.   If the tube becomes dislodged please keep the tube and contact the Cortrak team at [email or web address] ([email or web address]) for replacement.  If after hours and replacement cannot be delayed, place a NG tube and confirm placement with an abdominal x-ray.    Jacklynn Barnacle, MS, RD, LDN Office: [phone] Pager: [phone] After Hours/Weekend Pager: [phone]

## 2019-10-05 NOTE — Progress Notes (Signed)
Occupational Therapy Treatment Patient Details Name: Ryan Villa MRN: [id number] DOB: 05/12/1956 Today's Date: 10/05/2019    History of present illness Pt adm with rt humeral fx after fall at home and in the floor x 3 days. Pt intubated 12/23 and self extubated 12/25. Pt with acute metabolic encephalopathy and delirium, PMH -R BKA, ckd, dm, htn, intracerebral hemorrhage. Pt now s/p R TSA 1/5. Pt re-intubated 1/8 due to suspected aspiration PNA.   OT comments  Pt seen for progress session s.p extubation. Pt exhibiting ability to follow most commands, but requires cues to attend to task. Pt tolerating PROM to shoulder- flex and ER; elbow, wrist and hand, AAROM. Pt reports no pain with RUE at this time. Pt fitted in sling for transfer as pt without sock for prosthetic application. Pt modA for bed mobility and modA to maxA +2 for transfers without prosthetic. Pt asked to have family bring in sleeve or socks for prosthetic use. Pt maxA+2 for any standing ADL and minA to maxA overall for seated ADL. Pt would benefit from continued OT skilled services for ADL, mobility and safety in SNF setting. OT following acutely. Goals updated for appropriateness.       Follow Up Recommendations  SNF;Supervision/Assistance - 24 hour    Equipment Recommendations  None recommended by OT    Recommendations for Other Services      Precautions / Restrictions Precautions Precautions: Fall;Shoulder Type of Shoulder Precautions: okay to WB with transfers, otherwise no lifting; sling on when not ambulating or doing BADLs, okay for pendulums; no abduction; ER: 30 degrees; no AROM; okay hand wrist and elbow AROM Shoulder Interventions: Shoulder sling/immobilizer;For comfort Precaution Comments: R humerus ORIF 1/5 Required Braces or Orthoses: Sling Restrictions Weight Bearing Restrictions: Yes RUE Weight Bearing: Non weight bearing       Mobility Bed Mobility Overal bed mobility: Needs Assistance Bed  Mobility: Sidelying to Sit;Sit to Supine   Sidelying to sit: Mod assist Supine to sit: Mod assist     General bed mobility comments: pt used L UE to pull up on bed rail, modA to manage R UE and for trunk elevation,pt returned self to bed impulsively  Transfers Overall transfer level: Needs assistance Equipment used: 2 person hand held assist Transfers: Sit to/from [facility] Sit to Stand: Mod assist;Max assist;+2 physical assistance;+2 safety/equipment Stand pivot transfers: Mod assist;Max assist;+2 physical assistance;+2 safety/equipment       General transfer comment: Pt without socks for prosthetic; modA +2 for stability with +2 hand held assist.    Balance Overall balance assessment: Needs assistance Sitting-balance support: Single extremity supported;No upper extremity supported;Feet supported Sitting balance-Leahy Scale: Poor Sitting balance - Comments: Pt sitting EOB ~5 mins with intermittent cues to sit upright                                   ADL either performed or assessed with clinical judgement   ADL Overall ADL's : Needs assistance/impaired                         Toilet Transfer: Moderate assistance;+2 for physical assistance;+2 for safety/equipment;Stand-pivot;BSC   Toileting- Clothing Manipulation and Hygiene: Maximal assistance;+2 for physical assistance;+2 for safety/equipment;Sitting/lateral lean;Sit to/from stand Toileting - Clothing Manipulation Details (indicate cue type and reason): Pt stood for ~30 secs for hygiene     Functional mobility during ADLs: Moderate assistance;Maximal assistance;+2 for physical assistance;+2  for safety/equipment;Cueing for safety;Cueing for sequencing General ADL Comments: pt limited by RUE precautions  being dominant arm for BADL, impaired cognition, gneralized weakness with decreased activity tolerance, and poor balance for safe and independent BADL     Vision   Vision  Assessment?: No apparent visual deficits   Perception     Praxis      Cognition Arousal/Alertness: Awake/alert Behavior During Therapy: WFL for tasks assessed/performed Overall Cognitive Status: Impaired/Different from baseline Area of Impairment: Orientation;Attention;Memory;Following commands;Safety/judgement;Awareness;Problem solving                 Orientation Level: Time;Situation Current Attention Level: Selective Memory: Decreased recall of precautions;Decreased short-term memory Following Commands: Follows one step commands with increased time Safety/Judgement: Decreased awareness of safety Awareness: Intellectual Problem Solving: Requires verbal cues;Requires tactile cues;Slow processing General Comments: Pt often shaking head no in regards to any movement or transfers        Exercises Exercises: Other exercises Other Exercises Other Exercises: PROM to shoulder- flex and ER; elbow, wrist and hand, AAROM.   Shoulder Instructions       General Comments Very soft voice    Pertinent Vitals/ Pain       Pain Assessment: Faces Faces Pain Scale: No hurt Pain Descriptors / Indicators: Grimacing;Guarding Pain Intervention(s): Monitored during session  Home Living Family/patient expects to be discharged to:: Private residence Living Arrangements: Parent Available Help at Discharge: Family;Available 24 hours/day   Home Access: Stairs to enter [employer] of Steps: 2-3   Home Layout: One level     Bathroom Shower/Tub: Occupational psychologist: Standard     Home Equipment: Environmental consultant - 2 wheels;Wheelchair - Biomedical scientist Comments: Info from last stay      Prior Functioning/Environment Level of Independence: Needs assistance  Gait / Transfers Assistance Needed: pt reports using RW and prosthesis for mobility PTA  ADL's / Homemaking Assistance Needed: independent ADLs, mother does IADLs, does not drive        Frequency  Min 2X/week        Progress Toward Goals  OT Goals(current goals can now be found in the care plan section)  Progress towards OT goals: Progressing toward goals  Acute Rehab OT Goals Patient Stated Goal: none stated OT Goal Formulation: With patient Time For Goal Achievement: 10/19/19 Potential to Achieve Goals: Good ADL Goals Pt Will Perform Grooming: with min assist;sitting Pt Will Perform Upper Body Bathing: with min assist;sitting Pt Will Perform Lower Body Dressing: with mod assist;with adaptive equipment;sitting/lateral leans Pt Will Transfer to Toilet: with min assist;stand pivot transfer;squat pivot transfer;bedside commode Additional ADL Goal #1: Patient will follow 1 step commands with 90% accuracy to optimize independence with ADLs. Additional ADL Goal #2: Patient will complete bed mobilty with min assist and maintain sitting EOB for 10 minutes dynamically with supervision as precursor to ADls.  Plan Discharge plan remains appropriate;Frequency remains appropriate    Co-evaluation    PT/OT/SLP Co-Evaluation/Treatment: Yes Reason for Co-Treatment: Complexity of the patient's impairments (multi-system involvement);For patient/therapist safety;To address functional/ADL transfers   OT goals addressed during session: ADL's and self-care      AM-PAC OT "6 Clicks" Daily Activity     Outcome Measure   Help from another person eating meals?: Total Help from another person taking care of personal grooming?: A Lot Help from another person toileting, which includes using toliet, bedpan, or urinal?: Total Help from another person bathing (including washing, rinsing, drying)?: A Lot Help from another person  to put on and taking off regular upper body clothing?: A Lot Help from another person to put on and taking off regular lower body clothing?: Total 6 Click Score: 9    End of Session Equipment Utilized During Treatment: Gait belt  OT Visit Diagnosis:  Muscle weakness (generalized) (M62.81);Other symptoms and signs involving cognitive function;Pain Pain - Right/Left: Right Pain - part of body: Shoulder;Arm   Activity Tolerance Patient tolerated treatment well   Patient Left in chair;with call bell/phone within reach;with chair alarm set   Nurse Communication Mobility status        Time: 1140-1200 OT Time Calculation (min): 20 min  Charges: OT General Charges $OT Visit: 1 Visit OT Treatments $Therapeutic Activity: 8-22 mins  Jefferey Pica OTR/L Acute Rehabilitation Services Pager: [phone] Office: [phone]    Batya Citron C 10/05/2019, 1:54 PM

## 2019-10-05 NOTE — Progress Notes (Signed)
PROGRESS NOTE  Le Faulcon EPP:[id number] DOB: 18-Nov-1955 DOA: 09/15/2019 PCP: Maryella Shivers, MD  Brief History   64 year old male transferred from [facility] 12/23 for acute respiratory failure, rhabdomyolysis, acute kidney injury due to services unavailable at that facility. He self extubated 12/25.  Transferred out of ICU 12/27. The patient was taken for right shoulder reverse arthroplasty 1/5. Early AM 1/8 had vomiting with probable aspiration.  Placed on NRB and transferred to ICU for intubation 10/01/2019. He was weaned to extubation on 10/04/2019. He has been evaluated by SLP . They plan to perform an MBS. cortrak has been placed.  Consultants  . Orthopedic surgery . Wound Care . Nephrology . PCCM  Procedures  . Intubation x 2 . ORIF of right shoulder  Antibiotics   Anti-infectives (From admission, onward)   Start     Dose/Rate Route Frequency Ordered Stop   10/04/19 1230  meropenem (MERREM) 500 mg in sodium chloride 0.9 % 100 mL IVPB     500 mg 200 mL/hr over 30 Minutes Intravenous Every 24 hours 10/04/19 1213     10/04/19 1000  ceFEPIme (MAXIPIME) 1 g in sodium chloride 0.9 % 100 mL IVPB  Status:  Discontinued     1 g 200 mL/hr over 30 Minutes Intravenous Every 24 hours 10/03/19 0921 10/04/19 1213   10/03/19 0930  ceFEPIme (MAXIPIME) 1 g in sodium chloride 0.9 % 100 mL IVPB     1 g 200 mL/hr over 30 Minutes Intravenous STAT 10/03/19 0921 10/03/19 1100   10/03/19 0800  cefTAZidime (FORTAZ) 0.5 g in dextrose 5 % 50 mL IVPB  Status:  Discontinued     0.5 g 100 mL/hr over 30 Minutes Intravenous Every 24 hours 10/03/19 0705 10/03/19 0921   10/01/19 0800  Ampicillin-Sulbactam (UNASYN) 3 g in sodium chloride 0.9 % 100 mL IVPB  Status:  Discontinued     3 g 200 mL/hr over 30 Minutes Intravenous Every 12 hours 10/01/19 0640 10/03/19 0655   09/28/19 2030  ceFAZolin (ANCEF) IVPB 1 g/50 mL premix     1 g 100 mL/hr over 30 Minutes Intravenous Every 12 hours 09/28/19 1624  09/29/19 1910   09/28/19 1157  vancomycin (VANCOCIN) powder  Status:  Discontinued       As needed 09/28/19 1158 09/28/19 1457   09/28/19 0600  vancomycin (VANCOCIN) IVPB 1000 mg/200 mL premix  Status:  Discontinued     1,000 mg 200 mL/hr over 60 Minutes Intravenous On call to O.R. 09/27/19 1159 09/28/19 1607   09/28/19 0000  ceFAZolin (ANCEF) IVPB 2g/100 mL premix    Note to Pharmacy: Surgery delayed until 1/5.   2 g 200 mL/hr over 30 Minutes Intravenous To Surgery 09/22/19 0904 09/28/19 1222   09/22/19 0815  ceFAZolin (ANCEF) IVPB 2g/100 mL premix  Status:  Discontinued     2 g 200 mL/hr over 30 Minutes Intravenous To Surgery 09/22/19 0806 09/22/19 0905   09/15/19 1715  azithromycin (ZITHROMAX) 500 mg in sodium chloride 0.9 % 250 mL IVPB  Status:  Discontinued     500 mg 250 mL/hr over 60 Minutes Intravenous Every 24 hours 09/15/19 1711 09/18/19 1107   09/15/19 1715  cefTRIAXone (ROCEPHIN) 1 g in sodium chloride 0.9 % 100 mL IVPB     1 g 200 mL/hr over 30 Minutes Intravenous Every 24 hours 09/15/19 1711 09/21/19 1754    .  [name]   Subjective  The patient is resting comfortably. No new complaints.  Objective   Vitals:  Vitals:   10/05/19 1400 10/05/19 1426  BP:  (!) 160/97  Pulse: (!) 58 (!) 55  Resp: 18 20  Temp:    SpO2: 99% 95%    Exam:  Constitutional:  . The patient is awake, alert, and oriented x 3. No acute distress. Respiratory:  . No increased work of breathing. . No wheezes, rales, or rhonchi . No tactile fremitus Cardiovascular:  . Regular rate and rhythm . No murmurs, ectopy, or gallups. . No lateral PMI. No thrills. Abdomen:  . Abdomen is soft, non-tender, non-distended . No hernias, masses, or organomegaly . Normoactive bowel sounds.  Musculoskeletal:  . No cyanosis, clubbing, or edema . Bruising to right shoulder with sutures in place. Skin:  . No rashes, lesions, ulcers . palpation of skin: no induration or nodules Neurologic:  . CN 2-12  intact . Sensation all 4 extremities intact Psychiatric:  . Mental status o Mood, affect appropriate o Orientation to person, place, time  . judgment and insight appear intact  I have personally reviewed the following:   Today's Data  . Vitals, CBC, BMP .  Micro Data  . Pseudomonoas from trach 10/01/2019. [name] Enterobacter cloacae from trach 10/01/2019. Scheduled Meds: . chlorhexidine gluconate (MEDLINE KIT)  15 mL Mouth Rinse BID  . Chlorhexidine Gluconate Cloth  6 each Topical Daily  . darbepoetin (ARANESP) injection - NON-DIALYSIS  150 mcg Subcutaneous Q Thu-1800  . docusate  100 mg Per Tube BID  . free water  300 mL Per Tube Q4H  . insulin aspart  0-9 Units Subcutaneous Q4H  . mouth rinse  15 mL Mouth Rinse q12n4p  . pantoprazole sodium  40 mg Per Tube Daily  . pneumococcal 23 valent vaccine  0.5 mL Intramuscular Tomorrow-1000  . polyethylene glycol  17 g Per Tube BID   Continuous Infusions: . sodium chloride Stopped (10/05/19 1339)  . argatroban 1.45 mcg/kg/min (10/05/19 1400)  . dextrose 75 mL/hr at 10/05/19 1400  . feeding supplement (OSMOLITE 1.2 CAL)    . meropenem (MERREM) IV Stopped (10/05/19 1331)    Principal Problem:   Closed fracture of right proximal humerus Active Problems:   Acute respiratory failure (HCC)   AKI (acute kidney injury) (HCC)   Rhabdomyolysis   Acute renal failure (ARF) (HCC)   Pressure injury of skin   Protein-calorie malnutrition, severe   Encounter for feeding tube placement   Aspiration into airway   Fracture   LOS: 20 days   A & P  Acute hypoxic respiratory failure from Pseudomonal HCAP. The patient was extubated on 10/04/2019. He will be continued of cefepime. He remains NPO per SLP. Flutter Valve at bedside to improve productive cough. PT/OT has been consulted and the patient will get OOB each shift.  Dysphagia and Vomiting leading to aspiration pneumonia: Continued NPO status pending MBS. Cortrak placed and nutrition consult  placed. It is felt that the patient's vomiting that led to aspiration was probably due to retain barium in the colon.   Severe protein calorie malnutrtion: nutrition consuled. MBS is planned. Continue NPO status until then. Cortrak has been placed.  CKD 5: Nephrology has been consulted. They feel that the patient would be a poor candidate for dialysis. Hyperkalemia has been resolved and Lokelma has been stopped. They see no current absolute indications for RRT. They feel that his creatinine is trending towards his baseline.  Hypernatremia: Renal following  Anemia of critical illness and chronic disease: Monitor hemoglobin and transfuse for less than 7.0  Hyperglycemia: The  patient is getting SSI q4hrs.  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 Will eventually need to transition to eliquis when more stable.  History of Essential Hypertension: Currently normotensive off medication. Monitor.  Right proximal humerus fracture - s/p reverse shoulder arthroplasty 1/5: Post op care per ortho. PT / OT has been consulted.   I have seen and examined this patient myself. I have spent 36 minutes in his evaluation and care.  DVT prophylaxis: Argatroban Code Status: Full Disposition: Transfer to ICU. Family Communication: None available.  Kyeshia Zinn, DO [facility] Direct contact: see [email or web address]  7PM-7AM contact night coverage as above 10/05/2019, 3:19 PM  LOS: 20 days

## 2019-10-05 NOTE — Progress Notes (Signed)
Nutrition Follow-up  DOCUMENTATION CODES:   Severe malnutrition in context of acute illness/injury  INTERVENTION:   Initiate Osmolite 1.2 @ 30 ml/hr via Cortrak tube and increase by 10 ml every 4 hours to goal rate of 70 ml/hr  30 ml Prostat daily  Provides: 2116 kcal, 108 grams protein, and 1362 ml free water.    NUTRITION DIAGNOSIS:   Severe Malnutrition related to acute illness(S/P fall at home with rhabdomyolysis after lying on the floor for 3 days) as evidenced by moderate fat depletion, moderate muscle depletion, severe muscle depletion, severe fat depletion.  Ongoing.  GOAL:   Patient will meet greater than or equal to 90% of their needs  Progressing.   MONITOR:   Vent status, TF tolerance  REASON FOR ASSESSMENT:   Consult, Ventilator Enteral/tube feeding initiation and management  ASSESSMENT:   65 yo male admitted with respiratory failure, rhabdomyolysis, and AKI after fall at home 3 days PTA. He had been lying on the floor x 3 days since his fall. Found to have PNA, UTI, and right proximal humerus fracture. PMH includes CKD-IV, HLD, DM-2, HTN, L carotid stenosis, ICH, R BKA.  12/25- extubated  12/28- failed MBS, gastric Cortrak placed 1/5- s/p R reverse shoulder arthroplasty   1/2- Cortrak removed, diet advanced DYS1/nectar thick liquids 1/8 vomited x 4 and aspirated, pt tx to ICU and intubated 1/11 extubated 1/12 plan for cortrak today, SLP planning MBS  Medications reviewed and include: colace, miralax, SSI 300 ml free water every 4 hours = 1800 ml  D5 @ 75 Labs reviewed: Na 152 (H), BUN/Cr: 88/7.18 (H)     Diet Order:   Diet Order            Diet NPO time specified  Diet effective now              EDUCATION NEEDS:   Not appropriate for education at this time  Skin:  Skin Assessment: Skin Integrity Issues: Skin Integrity Issues:: Other (Comment) Stage II: N/A Other: skin tear x 2 to R forearm  Last BM:  1/10  Height:   Ht  Readings from Last 1 Encounters:  10/01/19 5\' 8"  (1.727 m)    Weight:   Wt Readings from Last 1 Encounters:  10/04/19 61.9 kg    Ideal Body Weight:  65.5 kg(adjusted for BKA)  BMI:  Body mass index is 20.75 kg/m.  Estimated Nutritional Needs:   Kcal:  2000-2200  Protein:  100-125 grams  Fluid:  >2 L/day  Maylon Peppers RD, LDN, CNSC [phone] Pager [phone] After Hours Pager

## 2019-10-05 NOTE — Progress Notes (Signed)
Received from 4N after receiving report from [place].  Oriented to room and surroundings.  No complaints or signs of distress.

## 2019-10-05 NOTE — Progress Notes (Signed)
Physical Therapy Treatment Patient Details Name: Ryan Villa MRN: [id number] DOB: 10/26/1955 Today's Date: 10/05/2019    History of Present Illness Pt adm with rt humeral fx after fall at home and in the floor x 3 days. Pt intubated 12/23 and self extubated 12/25. Pt with acute metabolic encephalopathy and delirium, PMH -R BKA, ckd, dm, htn, intracerebral hemorrhage. Pt now s/p R TSA 1/5. Pt re-intubated 1/8 due to suspected aspiration PNA.    PT Comments    Pt agreeable to participation.  Very soft spoken, HOH, slower to respond to commands.  Emphasis on transitions, sitting balance, sit to stand and transfers.    Follow Up Recommendations  SNF     Equipment Recommendations  None recommended by PT    Recommendations for Other Services       Precautions / Restrictions Precautions Precautions: Fall;Shoulder Type of Shoulder Precautions: okay to WB with transfers, otherwise no lifting; sling on when not ambulating or doing BADLs, okay for pendulums; no abduction; ER: 30 degrees; no AROM; okay hand wrist and elbow AROM Shoulder Interventions: Shoulder sling/immobilizer;For comfort Precaution Comments: R humerus ORIF 1/5 Required Braces or Orthoses: Sling Restrictions Weight Bearing Restrictions: Yes RUE Weight Bearing: Non weight bearing    Mobility  Bed Mobility Overal bed mobility: Needs Assistance Bed Mobility: Sidelying to Sit;Sit to Supine   Sidelying to sit: Mod assist Supine to sit: Mod assist     General bed mobility comments: pt used L UE to pull up on bed rail, modA to manage R UE and for trunk elevation,pt returned self to bed impulsively  Transfers Overall transfer level: Needs assistance Equipment used: 2 person hand held assist Transfers: Sit to/from [facility] Sit to Stand: Mod assist;Max assist;+2 physical assistance;+2 safety/equipment Stand pivot transfers: Mod assist;Max assist;+2 physical assistance;+2 safety/equipment        General transfer comment: Pt without socks for prosthetic; modA +2 for stability with +2 hand held assist.  Ambulation/Gait             General Gait Details: NT   Stairs             Wheelchair Mobility    Modified Rankin (Stroke Patients Only)       Balance Overall balance assessment: Needs assistance Sitting-balance support: Single extremity supported;No upper extremity supported;Feet supported Sitting balance-Leahy Scale: Poor Sitting balance - Comments: Pt sitting EOB ~5 mins with intermittent cues to sit upright                                    Cognition Arousal/Alertness: Awake/alert Behavior During Therapy: WFL for tasks assessed/performed Overall Cognitive Status: Impaired/Different from baseline Area of Impairment: Orientation;Attention;Memory;Following commands;Safety/judgement;Awareness;Problem solving                 Orientation Level: Time;Situation Current Attention Level: Selective Memory: Decreased recall of precautions;Decreased short-term memory Following Commands: Follows one step commands with increased time Safety/Judgement: Decreased awareness of safety Awareness: Intellectual Problem Solving: Requires verbal cues;Requires tactile cues;Slow processing General Comments: Pt often shaking head no in regards to any movement or transfers      Exercises Other Exercises Other Exercises: PROM to shoulder- flex and ER; elbow, wrist and hand, AAROM. Other Exercises: AA ROM  with graded resistance in gross extension x10 reps to bil LE's    General Comments General comments (skin integrity, edema, etc.): Very soft voice      Pertinent Vitals/Pain  Pain Assessment: Faces Faces Pain Scale: No hurt Pain Descriptors / Indicators: Grimacing;Guarding Pain Intervention(s): Monitored during session    Home Living Family/patient expects to be discharged to:: Private residence Living Arrangements: Parent Available Help at  Discharge: Family;Available 24 hours/day   Home Access: Stairs to enter   Home Layout: One level Home Equipment: Walker - 2 wheels;Wheelchair - Sport and exercise psychologist Comments: Info from last stay    Prior Function Level of Independence: Needs assistance  Gait / Transfers Assistance Needed: pt reports using RW and prosthesis for mobility PTA  ADL's / Homemaking Assistance Needed: independent ADLs, mother does IADLs, does not drive     PT Goals (current goals can now be found in the care plan section) Acute Rehab PT Goals Patient Stated Goal: none stated PT Goal Formulation: With patient Time For Goal Achievement: 10/18/19 Potential to Achieve Goals: Good Progress towards PT goals: Progressing toward goals    Frequency    Min 2X/week      PT Plan      Co-evaluation PT/OT/SLP Co-Evaluation/Treatment: Yes Reason for Co-Treatment: Complexity of the patient's impairments (multi-system involvement) PT goals addressed during session: Mobility/safety with mobility OT goals addressed during session: ADL's and self-care      AM-PAC PT "6 Clicks" Mobility   Outcome Measure  Help needed turning from your back to your side while in a flat bed without using bedrails?: A Little Help needed moving from lying on your back to sitting on the side of a flat bed without using bedrails?: A Little Help needed moving to and from a bed to a chair (including a wheelchair)?: Total Help needed standing up from a chair using your arms (e.g., wheelchair or bedside chair)?: Total Help needed to walk in hospital room?: Total Help needed climbing 3-5 steps with a railing? : Total 6 Click Score: 10    End of Session   Activity Tolerance: Patient tolerated treatment well;Patient limited by fatigue Patient left: in chair;with call bell/phone within reach;with chair alarm set Nurse Communication: Mobility status PT Visit Diagnosis: Other abnormalities of gait and mobility (R26.89);Muscle  weakness (generalized) (M62.81);Pain Pain - Right/Left: Right Pain - part of body: Shoulder     Time: [id number] PT Time Calculation (min) (ACUTE ONLY): 35 min  Charges:  $Therapeutic Activity: 8-22 mins                     10/05/2019  Ryan Carne., PT Acute Rehabilitation Services [phone]  (pager) [phone]  (office)   Ryan Villa 10/05/2019, 3:11 PM

## 2019-10-06 ENCOUNTER — Inpatient Hospital Stay (HOSPITAL_COMMUNITY): Payer: Medicare Other

## 2019-10-06 LAB — CBC
HCT: 32.3 % — ABNORMAL LOW (ref 39.0–52.0)
Hemoglobin: 10.1 g/dL — ABNORMAL LOW (ref 13.0–17.0)
MCH: 30.7 pg (ref 26.0–34.0)
MCHC: 31.3 g/dL (ref 30.0–36.0)
MCV: 98.2 fL (ref 80.0–100.0)
Platelets: 138 10*3/uL — ABNORMAL LOW (ref 150–400)
RBC: 3.29 MIL/uL — ABNORMAL LOW (ref 4.22–5.81)
RDW: 14 % (ref 11.5–15.5)
WBC: 5.9 10*3/uL (ref 4.0–10.5)
nRBC: 0 % (ref 0.0–0.2)

## 2019-10-06 LAB — GLUCOSE, CAPILLARY
Glucose-Capillary: 134 mg/dL — ABNORMAL HIGH (ref 70–99)
Glucose-Capillary: 140 mg/dL — ABNORMAL HIGH (ref 70–99)
Glucose-Capillary: 169 mg/dL — ABNORMAL HIGH (ref 70–99)
Glucose-Capillary: 178 mg/dL — ABNORMAL HIGH (ref 70–99)
Glucose-Capillary: 190 mg/dL — ABNORMAL HIGH (ref 70–99)
Glucose-Capillary: 192 mg/dL — ABNORMAL HIGH (ref 70–99)
Glucose-Capillary: 208 mg/dL — ABNORMAL HIGH (ref 70–99)

## 2019-10-06 LAB — RENAL FUNCTION PANEL
Albumin: 1.7 g/dL — ABNORMAL LOW (ref 3.5–5.0)
Anion gap: 11 (ref 5–15)
BUN: 76 mg/dL — ABNORMAL HIGH (ref 8–23)
CO2: 22 mmol/L (ref 22–32)
Calcium: 7.2 mg/dL — ABNORMAL LOW (ref 8.9–10.3)
Chloride: 114 mmol/L — ABNORMAL HIGH (ref 98–111)
Creatinine, Ser: 6.13 mg/dL — ABNORMAL HIGH (ref 0.61–1.24)
GFR calc Af Amer: 10 mL/min — ABNORMAL LOW (ref >60)
GFR calc non Af Amer: 9 mL/min — ABNORMAL LOW (ref >60)
Glucose, Bld: 206 mg/dL — ABNORMAL HIGH (ref 70–99)
Phosphorus: 4 mg/dL (ref 2.5–4.6)
Potassium: 3.8 mmol/L (ref 3.5–5.1)
Sodium: 147 mmol/L — ABNORMAL HIGH (ref 135–145)

## 2019-10-06 LAB — APTT: aPTT: 64 seconds — ABNORMAL HIGH (ref 24–36)

## 2019-10-06 MED ORDER — RESOURCE THICKENUP CLEAR PO POWD
ORAL | Status: DC | PRN
  Filled 2019-10-06: qty 125

## 2019-10-06 NOTE — Progress Notes (Addendum)
RN notified MD Pokhrel about pt drinking from NS liter bottle that was on pt side table. Possible aspiration. Pt bilateral lower lobes have crackles on expiration. RN will continue to monitor patient.

## 2019-10-06 NOTE — Progress Notes (Signed)
[facility] for Argatroban  Indication: HIT   Allergies  Allergen Reactions  . 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 Heparin was stopped, and SRA was positive on 09/22/19.  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 The guidelines are unclear on the intended length of therapy with the patient not having a confirmed thrombosis, but could consider continuing anticoagulation for 4 weeks. The patient could eventually transition to oral anticoagulation.   aPTT this morning remains therapeutic at 64 sec. No bleeding reported; he is on Aranesp for anemia - Hg up to 10.1. Platelet count is fluctuating, 138 today.  Goal of Therapy:  aPTT 50-90 seconds Monitor platelets by anticoagulation protocol: Yes    Plan:  Continue argatroban at 1.45 mc/kg/min Monitor daily aPTT and CBC, s/sx bleeding F/U transition to Eliquis pending SLP eval  Thank you for allowing pharmacy to be a part of this patient's care.  Alycia Rossetti, PharmD, BCPS Clinical Pharmacist Clinical phone for 10/06/2019: [phone] 10/06/2019 9:01 AM   **Pharmacist phone directory can now be found on [email or web address] ([email or web address]).  Listed under [place].

## 2019-10-06 NOTE — Progress Notes (Signed)
[facility] Progress Note    Assessment/Plan: **CKD 5:  Baseline Cr ~5 -  Recurrent AKI secondary to acute hypoxic event 1/8, cr up to 8.28.   But now seems to be trending back toward his baseline again.  Dr. Johnney Ou has contacted pts mother, she consents to him getting dialysis if needed. Now I have talked with the brother as well.  Unfortunately I feel he will not be a good HD patient- will likely be non compliant unless in a facility.  Thankfully no indications today - numbers cont to improve  **Acute hypoxic resp failure: secondary to aspiration, intubation; per PCCM.  Now extubated- failing swallowing study  **Hyperkalemia: resolved-   **Hypernatremia:  On free water - total of 1800 per 24 hours -   D5 at 75 per hour.  Sodium better but not normal... cont both  **Anemia: improved following transfusion 1/7.  Iron replete 1 week ago. On ESA, dose increased on 1/7.  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   _______________________________________________________________________   Subjective:   Has 1100 of UOP-  BUN , crt and sodium all down, failed swallowing- he is still thirsty    Objective Vitals:   10/06/19 0500 10/06/19 0804 10/06/19 0851 10/06/19 1143  BP:   (!) 142/74 130/75  Pulse:   66 63  Resp:   18 16  Temp:   (!) 97.5 F (36.4 C) 98.1 F (36.7 C)  TempSrc:  Oral Oral Oral  SpO2:   99% 100%  Weight: 64.3 kg     Height:       Physical Exam General: slow MS -  Trouble with voice but seems to understand what I am saying Heart: no rub Lungs: vent, clear ant and laterally Abdomen: soft, thin Extremities: no edema Neuro: nonfocal. No asterixis  Additional Objective Labs: Basic Metabolic Panel: Recent Labs  Lab 10/04/19 0536 10/05/19 0549 10/06/19 0225  NA 154* 152* 147*  K 4.1 3.7 3.8  CL 118* 114* 114*  CO2 20* 24 22  GLUCOSE 89 157* 206*  BUN 101* 88* 76*  CREATININE 8.04* 7.18* 6.13*  CALCIUM  7.7* 7.6* 7.2*  PHOS 6.5* 5.8* 4.0   Liver Function Tests: Recent Labs  Lab 10/04/19 0536 10/05/19 0549 10/06/19 0225  ALBUMIN 1.7* 1.8* 1.7*   No results for input(s): LIPASE, AMYLASE in the last 168 hours. CBC: Recent Labs  Lab 10/02/19 0549 10/03/19 0137 10/04/19 0536 10/05/19 0549 10/06/19 0225  WBC 5.8 7.1 7.0 4.3 5.9  HGB 8.2* 8.1* 9.3* 10.8* 10.1*  HCT 27.4* 26.1* 31.1* 36.1* 32.3*  MCV 101.5* 98.9 99.7 101.4* 98.2  PLT PLATELET CLUMPS NOTED ON SMEAR, COUNT APPEARS DECREASED 115* 132* 142* 138*   Blood Culture    Component Value Date/Time   SDES TRACHEAL ASPIRATE 10/01/2019 1210   SPECREQUEST NONE 10/01/2019 1210   CULT  10/01/2019 1210    FEW PSEUDOMONAS AERUGINOSA FEW ENTEROBACTER CLOACAE    REPTSTATUS 10/04/2019 FINAL 10/01/2019 1210    Cardiac Enzymes: No results for input(s): CKTOTAL, CKMB, CKMBINDEX, TROPONINI in the last 168 hours. CBG: Recent Labs  Lab 10/05/19 1943 10/06/19 0005 10/06/19 0440 10/06/19 0803 10/06/19 1138  GLUCAP 139* 190* 178* 208* 134*   Iron Studies: No results for input(s): IRON, TIBC, TRANSFERRIN, FERRITIN in the last 72 hours. '@[email or web address]' @ Studies/Results: DG Swallowing Func-Speech Pathology  Result Date: 10/06/2019 Objective Swallowing Evaluation: Type of Study: MBS-Modified Barium Swallow Study  Patient Details Name: Ryan Villa MRN: [id number]  Date of Birth: 1956-07-06 Today's Date: 10/06/2019 Time: SLP Start Time (ACUTE ONLY): 0935 -SLP Stop Time (ACUTE ONLY): 1000 SLP Time Calculation (min) (ACUTE ONLY): 25 min Past Medical History: No past medical history on file. Past Surgical History: Past Surgical History: Procedure Laterality Date . REVERSE SHOULDER ARTHROPLASTY Right 09/28/2019  Procedure: RIGHT REVERSE SHOULDER ARTHROPLASTY;  Surgeon: Nicholes Stairs, MD;  Location: [facility];  Service: Orthopedics;  Laterality: Right; HPI: 64 year old male transferred from [facility] for acute respiratory failure,  rhabdomyolysis (fell and laid on floor 3 days), acute kidney injury. PMH: CKD stage IV, diabetes mellitus type 2, hyperlipidemia, hypertension, left carotid stenosis, intracerebral hemorrhage, status post right BKA. Per chart EMS reported that the patient's sister was bringing him food while he was lying on the floor during that 3-day period. Intubated 12/23-12/24 (self extubated). Found to have right humeral neck fracture and right upper lobe consolidation per chest x-ray, head CT no acute intracranial process,   Pt underwent MBS 12/28 with findings of severe dysphagia - asp of nectar/honey thick.  Repeat MBS 1/2 recommended Dys 1 diet and nectar thick liquids with chin tuck. On 1/8 pt had vomiting and suspected aspiration requiring reintubation; extuabted 1/11.  Subjective: alert, needs cues Assessment / Plan / Recommendation CHL IP CLINICAL IMPRESSIONS 10/06/2019 Clinical Impression Pts function is similiar in nature to prior MBS, but worsened in severeity with further deconditioning. Pt has slow lingual pumping and premature spillage with delayed swallow initiation. There is silent aspiration of moderate quantity with nectar and honey teaspoon. There is typically a very delayed congested cough when aspirate descends into the trachea. He cannot elicit a dry swallow without prolonged lingual pumping which makes a "clear throat and swallow" strategy pointless as he just reaspirates what he cleared while he tries to swallow. He was successful with puree, even with a heaping spoon. A chin tuck is very beneficial in increased epiglottic deflection, reducing vallecular residue and capturing a 1/2 teaspoon of honey thick prior to the swallow. HOWEVER, this must be done with 100% accuracy or pt will aspirate. Pt needs max cues for a deep chin tuck and the quantity of honey thick must be 1/2 teaspoon. And given silent aspiration it is difficult to determine success of this method subjectively. For this reason, recommend pt  continue with Cortrak, consuming pudding/puree only (no liquids). SLP only can offer liquid trials.  SLP Visit Diagnosis Dysphagia, oropharyngeal phase (R13.12) Attention and concentration deficit following -- Frontal lobe and executive function deficit following -- Impact on safety and function Moderate aspiration risk   CHL IP TREATMENT RECOMMENDATION 10/06/2019 Treatment Recommendations Therapy as outlined in treatment plan below   Prognosis 10/06/2019 Prognosis for Safe Diet Advancement Good Barriers to Reach Goals Cognitive deficits Barriers/Prognosis Comment -- CHL IP DIET RECOMMENDATION 10/06/2019 SLP Diet Recommendations Pudding thick liquid;Dysphagia 1 (Puree) solids Liquid Administration via -- Medication Administration Via alternative means Compensations Chin tuck Postural Changes --   CHL IP OTHER RECOMMENDATIONS 10/04/2019 Recommended Consults -- Oral Care Recommendations -- Other Recommendations Have oral suction available   CH[facility] IP FOLLOW UP RECOMMENDATIONS 10/06/2019 Follow up Recommendations Skilled Nursing facility   [facility] IP FREQUENCY AND DURATION 10/06/2019 Speech Therapy Frequency (ACUTE ONLY) min 2x/week Treatment Duration 2 weeks      CHL IP ORAL PHASE 10/06/2019 Oral Phase Impaired Oral - Pudding Teaspoon -- Oral - Pudding Cup -- Oral - Honey Teaspoon Lingual pumping;Reduced posterior propulsion;Delayed oral transit;Decreased bolus cohesion;Premature spillage Oral - Honey Cup -- Oral - Nectar  Teaspoon Lingual pumping;Reduced posterior propulsion;Delayed oral transit;Decreased bolus cohesion;Premature spillage Oral - Nectar Cup -- Oral - Nectar Straw NT Oral - Thin Teaspoon NT Oral - Thin Cup -- Oral - Thin Straw NT Oral - Puree Lingual pumping;Reduced posterior propulsion;Delayed oral transit;Decreased bolus cohesion;Premature spillage Oral - Mech Soft NT Oral - Regular -- Oral - Multi-Consistency -- Oral - Pill -- Oral Phase - Comment --  CHL IP PHARYNGEAL PHASE 10/06/2019 Pharyngeal Phase Impaired  Pharyngeal- Pudding Teaspoon -- Pharyngeal -- Pharyngeal- Pudding Cup -- Pharyngeal -- Pharyngeal- Honey Teaspoon Delayed swallow initiation-pyriform sinuses;Delayed swallow initiation-vallecula;Penetration/Aspiration before swallow;Reduced epiglottic inversion;Pharyngeal residue - valleculae;Moderate aspiration;Compensatory strategies attempted (with notebox) Pharyngeal Material enters airway, passes BELOW cords without attempt by patient to eject out (silent aspiration);Material does not enter airway Pharyngeal- Honey Cup -- Pharyngeal -- Pharyngeal- Nectar Teaspoon Delayed swallow initiation-pyriform sinuses;Penetration/Aspiration before swallow;Reduced epiglottic inversion;Pharyngeal residue - valleculae;Moderate aspiration Pharyngeal Material enters airway, passes BELOW cords without attempt by patient to eject out (silent aspiration) Pharyngeal- Nectar Cup NT Pharyngeal -- Pharyngeal- Nectar Straw NT Pharyngeal -- Pharyngeal- Thin Teaspoon NT Pharyngeal -- Pharyngeal- Thin Cup -- Pharyngeal -- Pharyngeal- Thin Straw NT Pharyngeal -- Pharyngeal- Puree Delayed swallow initiation-vallecula;Pharyngeal residue - valleculae;Reduced epiglottic inversion Pharyngeal Material does not enter airway Pharyngeal- Mechanical Soft NT Pharyngeal -- Pharyngeal- Regular -- Pharyngeal -- Pharyngeal- Multi-consistency -- Pharyngeal -- Pharyngeal- Pill -- Pharyngeal -- Pharyngeal Comment --  No flowsheet data found. Herbie Baltimore, MA CCC-SLP Acute Rehabilitation Services Pager [phone] Office [phone] Lynann Beaver 10/06/2019, 10:19 AM              Medications: . sodium chloride Stopped (10/05/19 2000)  . argatroban 1.45 mcg/kg/min (10/06/19 0537)  . dextrose Stopped (10/05/19 1610)  . feeding supplement (OSMOLITE 1.2 CAL) 1,000 mL (10/06/19 1045)  . meropenem (MERREM) IV Stopped (10/05/19 1331)   . chlorhexidine gluconate (MEDLINE KIT)  15 mL Mouth Rinse BID  . Chlorhexidine Gluconate Cloth  6 each  Topical Daily  . darbepoetin (ARANESP) injection - NON-DIALYSIS  150 mcg Subcutaneous Q Thu-1800  . docusate  100 mg Per Tube BID  . free water  300 mL Per Tube Q4H  . insulin aspart  0-9 Units Subcutaneous Q4H  . mouth rinse  15 mL Mouth Rinse q12n4p  . pantoprazole sodium  40 mg Per Tube Daily  . pneumococcal 23 valent vaccine  0.5 mL Intramuscular Tomorrow-1000  . polyethylene glycol  17 g Per Tube BID

## 2019-10-06 NOTE — Progress Notes (Addendum)
PROGRESS NOTE  Hermann Dottavio YKD:[id number] DOB: Sep 06, 1956 DOA: 09/15/2019 PCP: Maryella Shivers, MD   LOS: 21 days   Brief narrative: As per HPI,  64 year old male was transferred from [facility] 12/23 for acute respiratory failure, rhabdomyolysis, acute kidney injury due to services unavailable at that facility.  He was in the ICU intubated and self extubated on 12/25. Transferred out of ICU 12/27. The patient was taken for right shoulder reverse arthroplasty 1/5. Early AM, 1/8 had vomiting with probable aspiration. Placed on NRB and transferred to ICU for intubation 10/01/2019. He was weaned to extubation on 10/04/2019. He has been evaluated by SLP and has a Cortrak tube in place.  Assessment/Plan:  Principal Problem:   Closed fracture of right proximal humerus Active Problems:   Acute respiratory failure (HCC)   AKI (acute kidney injury) ([place])   Rhabdomyolysis   Acute renal failure (ARF) (HCC)   Pressure injury of skin   Protein-calorie malnutrition, severe   Encounter for feeding tube placement   Aspiration into airway   Fracture  Acute hypoxic respiratory failure from Pseudomonal HCAP.  Extubated on 10/04/2019 and was transferred out of the ICU.  Currently on  IV meropenem.  Was previously on cefepime.  Patient was seen by speech therapy who recommended dysphagia 1 diet today.  Continue flutter valve.[name]   Dysphagia and vomiting leading to aspiration pneumonia: Status post modified barium swallow today.  Cortrak tube in place and patient has been seen by speech therapy today and recommend dysphagia 1 diet.  On D5 water.  Severe protein calorie malnutrtion: Present on admission.  Continue tube feeding and oral nutrition.  Closely monitor.  Nutrition on board.  CKD 5: Nephrology on board.latest creatinine of 6.1, improving as per nephrology.  Baseline creatinine around 5.     Hyperkalemia.  Received Lokelma.  Hyperkalemia has resolved at this time.  Latest potassium of  3.8.  Hypernatremia: Mild.  We will continue with free water flushes.  Check BMP in a.m.  Anemia of critical illness and chronic disease: We will continue to monitor.  Transfuse if less than 7.  Hyperglycemia: Continue SSI q4hrs. latest POC glucose was 135  Heparin-induced thrombocytopenia. Continue argatroban. transition to eliquiswhen more stable.  Latest platelet count of 138.   Essential Hypertension: Currently normotensive, off medications. Monitor closely.  Latest blood pressure 135/75..  Right proximal humerus fracture status post reverse shoulder arthroplasty 1/5:  Orthopedics on board.  VTE Prophylaxis: Argatroban  Code Status: Full code  Family Communication: None today  Disposition Plan: Skilled nursing facility placement as per physical therapy evaluation.  Barriers to discharge include pending clinical improvement including improvement in oral intake, dysphagia, electrolyte imbalances, need for IV antibiotics.   Consultants:  Orthopedic surgery  Wound Care  Nephrology  PCCM  Procedures:  Intubation x 2  ORIF of right shoulder  CorTrak tube placement  Antibiotics: Meropenem  Anti-infectives (From admission, onward)   Start     Dose/Rate Route Frequency Ordered Stop   10/04/19 1230  meropenem (MERREM) 500 mg in sodium chloride 0.9 % 100 mL IVPB     500 mg 200 mL/hr over 30 Minutes Intravenous Every 24 hours 10/04/19 1213     10/04/19 1000  ceFEPIme (MAXIPIME) 1 g in sodium chloride 0.9 % 100 mL IVPB  Status:  Discontinued     1 g 200 mL/hr over 30 Minutes Intravenous Every 24 hours 10/03/19 0921 10/04/19 1213   10/03/19 0930  ceFEPIme (MAXIPIME) 1 g in sodium chloride 0.9 %  100 mL IVPB     1 g 200 mL/hr over 30 Minutes Intravenous STAT 10/03/19 0921 10/03/19 1100   10/03/19 0800  cefTAZidime (FORTAZ) 0.5 g in dextrose 5 % 50 mL IVPB  Status:  Discontinued     0.5 g 100 mL/hr over 30 Minutes Intravenous Every 24 hours 10/03/19 0705 10/03/19 0921    10/01/19 0800  Ampicillin-Sulbactam (UNASYN) 3 g in sodium chloride 0.9 % 100 mL IVPB  Status:  Discontinued     3 g 200 mL/hr over 30 Minutes Intravenous Every 12 hours 10/01/19 0640 10/03/19 0655   09/28/19 2030  ceFAZolin (ANCEF) IVPB 1 g/50 mL premix     1 g 100 mL/hr over 30 Minutes Intravenous Every 12 hours 09/28/19 1624 09/29/19 1910   09/28/19 1157  vancomycin (VANCOCIN) powder  Status:  Discontinued       As needed 09/28/19 1158 09/28/19 1457   09/28/19 0600  vancomycin (VANCOCIN) IVPB 1000 mg/200 mL premix  Status:  Discontinued     1,000 mg 200 mL/hr over 60 Minutes Intravenous On call to O.R. 09/27/19 1159 09/28/19 1607   09/28/19 0000  ceFAZolin (ANCEF) IVPB 2g/100 mL premix    Note to Pharmacy: Surgery delayed until 1/5.   2 g 200 mL/hr over 30 Minutes Intravenous To Surgery 09/22/19 0904 09/28/19 1222   09/22/19 0815  ceFAZolin (ANCEF) IVPB 2g/100 mL premix  Status:  Discontinued     2 g 200 mL/hr over 30 Minutes Intravenous To Surgery 09/22/19 0806 09/22/19 0905   09/15/19 1715  azithromycin (ZITHROMAX) 500 mg in sodium chloride 0.9 % 250 mL IVPB  Status:  Discontinued     500 mg 250 mL/hr over 60 Minutes Intravenous Every 24 hours 09/15/19 1711 09/18/19 1107   09/15/19 1715  cefTRIAXone (ROCEPHIN) 1 g in sodium chloride 0.9 % 100 mL IVPB     1 g 200 mL/hr over 30 Minutes Intravenous Every 24 hours 09/15/19 1711 09/21/19 1754     Subjective: Today, unable to verbalize much, appears to be very weak and deconditioned.  Objective: Vitals:   10/06/19 0409 10/06/19 0851  BP: 119/65 (!) 142/74  Pulse: 63 66  Resp: 20 18  Temp: 98.3 F (36.8 C) (!) 97.5 F (36.4 C)  SpO2: 99% 99%    Intake/Output Summary (Last 24 hours) at 10/06/2019 1138 Last data filed at 10/06/2019 0830 Gross per 24 hour  Intake 2010.26 ml  Output 1325 ml  Net 685.26 ml   Filed Weights   10/02/19 0500 10/04/19 0500 10/06/19 0500  Weight: 67.1 kg 61.9 kg 64.3 kg   Body mass index is 21.55  kg/m.   Physical Exam: GENERAL: Patient is alert awake but was unable to verbalize much, difficulty with speech, deconditioned and appears ill, not in obvious distress.  Thinly built HENT: No scleral pallor or icterus. Pupils equally reactive to light. Oral mucosa is mildly dry.  Cortrak tube in place NECK: is supple, no palpable thyroid enlargement. CHEST:Diminished breath sounds bilaterally. CVS: S1 and S2 heard, no murmur. Regular rate and rhythm. No pericardial rub. ABDOMEN: Soft, non-tender, bowel sounds are present. EXTREMITIES: No edema. CNS: Cranial nerves are intact.  Generalized weakness noted.  Able to comprehend SKIN: warm and dry without rashes.  Data Review: I have personally reviewed the following laboratory data and studies,  CBC: Recent Labs  Lab 10/02/19 0549 10/03/19 0137 10/04/19 0536 10/05/19 0549 10/06/19 0225  WBC 5.8 7.1 7.0 4.3 5.9  HGB 8.2* 8.1* 9.3* 10.8* 10.1*  HCT 27.4* 26.1* 31.1* 36.1* 32.3*  MCV 101.5* 98.9 99.7 101.4* 98.2  PLT PLATELET CLUMPS NOTED ON SMEAR, COUNT APPEARS DECREASED 115* 132* 142* 259*   Basic Metabolic Panel: Recent Labs  Lab 10/02/19 0549 10/03/19 0137 10/04/19 0536 10/05/19 0549 10/06/19 0225  NA 149* 149* 154* 152* 147*  K 4.9 4.5 4.1 3.7 3.8  CL 115* 118* 118* 114* 114*  CO2 21* 18* 20* 24 22  GLUCOSE 113* 91 89 157* 206*  BUN 94* 98* 101* 88* 76*  CREATININE 7.52* 8.28* 8.04* 7.18* 6.13*  CALCIUM 7.4* 7.6* 7.7* 7.6* 7.2*  MG  --  2.0  --  2.0  --   PHOS 6.3* 7.1* 6.5* 5.8* 4.0   Liver Function Tests: Recent Labs  Lab 10/02/19 0549 10/03/19 0137 10/04/19 0536 10/05/19 0549 10/06/19 0225  ALBUMIN 1.6* 1.6* 1.7* 1.8* 1.7*   No results for input(s): LIPASE, AMYLASE in the last 168 hours. No results for input(s): AMMONIA in the last 168 hours. Cardiac Enzymes: No results for input(s): CKTOTAL, CKMB, CKMBINDEX, TROPONINI in the last 168 hours. BNP (last 3 results) Recent Labs    09/15/19 1822  BNP  61.3    ProBNP (last 3 results) No results for input(s): PROBNP in the last 8760 hours.  CBG: Recent Labs  Lab 10/05/19 1514 10/05/19 1943 10/06/19 0005 10/06/19 0440 10/06/19 0803  GLUCAP 124* 139* 190* 178* 208*   Recent Results (from the past 240 hour(s))  Surgical pcr screen     Status: Abnormal   Collection Time: 09/28/19  6:36 AM   Specimen: Nasal Mucosa; Nasal Swab  Result Value Ref Range Status   MRSA, PCR POSITIVE (A) NEGATIVE Final    Comment: RESULT CALLED TO, READ BACK BY AND VERIFIED WITH: Tawni Levy RN 9:00 09/28/19 ([email or web address])    Staphylococcus aureus POSITIVE (A) NEGATIVE Final    Comment: (NOTE) The Xpert SA Assay (FDA approved for NASAL specimens in patients 27 years of age and older), is one component of a comprehensive surveillance program. It is not intended to diagnose infection nor to guide or monitor treatment. Performed at [facility], [place] [address] [place], [place] [address]   Culture, respiratory (non-expectorated)     Status: None   Collection Time: 10/01/19 12:10 PM   Specimen: Tracheal Aspirate; Respiratory  Result Value Ref Range Status   Specimen Description TRACHEAL ASPIRATE  Final   Special Requests NONE  Final   Gram Stain   Final    FEW WBC PRESENT,BOTH PMN AND MONONUCLEAR RARE GRAM POSITIVE COCCI IN PAIRS Performed at [facility], [place] [address] [place], [place] [address]    Culture   Final    FEW PSEUDOMONAS AERUGINOSA FEW ENTEROBACTER CLOACAE    Report Status 10/04/2019 FINAL  Final   Organism ID, Bacteria PSEUDOMONAS AERUGINOSA  Final   Organism ID, Bacteria ENTEROBACTER CLOACAE  Final      Susceptibility   Enterobacter cloacae - MIC*    CEFAZOLIN >=64 RESISTANT Resistant     CEFEPIME 16 RESISTANT Resistant     CEFTAZIDIME >=64 RESISTANT Resistant     CIPROFLOXACIN <=0.25 SENSITIVE Sensitive     GENTAMICIN <=1 SENSITIVE Sensitive     IMIPENEM 1 SENSITIVE Sensitive     TRIMETH/SULFA <=20 SENSITIVE  Sensitive     PIP/TAZO >=128 RESISTANT Resistant     * FEW ENTEROBACTER CLOACAE   Pseudomonas aeruginosa - MIC*    CEFTAZIDIME >=64 RESISTANT Resistant     CIPROFLOXACIN <=0.25 SENSITIVE  Sensitive     GENTAMICIN 2 SENSITIVE Sensitive     IMIPENEM 1 SENSITIVE Sensitive     PIP/TAZO 32 SENSITIVE Sensitive     CEFEPIME 8 SENSITIVE Sensitive     * FEW PSEUDOMONAS AERUGINOSA     Studies: DG Swallowing Func-Speech Pathology  Result Date: 10/06/2019 Objective Swallowing Evaluation: Type of Study: MBS-Modified Barium Swallow Study  Patient Details Name: Keny Donald MRN: [id number] Date of Birth: [birth year] Today's Date: 10/06/2019 Time: SLP Start Time (ACUTE ONLY): 0935 -SLP Stop Time (ACUTE ONLY): 1000 SLP Time Calculation (min) (ACUTE ONLY): 25 min Past Medical History: No past medical history on file. Past Surgical History: Past Surgical History: Procedure Laterality Date . REVERSE SHOULDER ARTHROPLASTY Right 09/28/2019  Procedure: RIGHT REVERSE SHOULDER ARTHROPLASTY;  Surgeon: Nicholes Stairs, MD;  [facility];  Service: Orthopedics;  Laterality: Right; HPI: 64 year old male transferred from [facility] for acute respiratory failure, rhabdomyolysis (fell and laid on floor 3 days), acute kidney injury. PMH: CKD stage IV, diabetes mellitus type 2, hyperlipidemia, hypertension, left carotid stenosis, intracerebral hemorrhage, status post right BKA. Per chart EMS reported that the patient's sister was bringing him food while he was lying on the floor during that 3-day period. Intubated 12/23-12/24 (self extubated). Found to have right humeral neck fracture and right upper lobe consolidation per chest x-ray, head CT no acute intracranial process,   Pt underwent MBS 12/28 with findings of severe dysphagia - asp of nectar/honey thick.  Repeat MBS 1/2 recommended Dys 1 diet and nectar thick liquids with chin tuck. On 1/8 pt had vomiting and suspected aspiration requiring reintubation; extuabted 1/11.   Subjective: alert, needs cues Assessment / Plan / Recommendation CHL IP CLINICAL IMPRESSIONS 10/06/2019 Clinical Impression Pts function is similiar in nature to prior MBS, but worsened in severeity with further deconditioning. Pt has slow lingual pumping and premature spillage with delayed swallow initiation. There is silent aspiration of moderate quantity with nectar and honey teaspoon. There is typically a very delayed congested cough when aspirate descends into the trachea. He cannot elicit a dry swallow without prolonged lingual pumping which makes a "clear throat and swallow" strategy pointless as he just reaspirates what he cleared while he tries to swallow. He was successful with puree, even with a heaping spoon. A chin tuck is very beneficial in increased epiglottic deflection, reducing vallecular residue and capturing a 1/2 teaspoon of honey thick prior to the swallow. HOWEVER, this must be done with 100% accuracy or pt will aspirate. Pt needs max cues for a deep chin tuck and the quantity of honey thick must be 1/2 teaspoon. And given silent aspiration it is difficult to determine success of this method subjectively. For this reason, recommend pt continue with Cortrak, consuming pudding/puree only (no liquids). SLP only can offer liquid trials.  SLP Visit Diagnosis Dysphagia, oropharyngeal phase (R13.12) Attention and concentration deficit following -- Frontal lobe and executive function deficit following -- Impact on safety and function Moderate aspiration risk   CHL IP TREATMENT RECOMMENDATION 10/06/2019 Treatment Recommendations Therapy as outlined in treatment plan below   Prognosis 10/06/2019 Prognosis for Safe Diet Advancement Good Barriers to Reach Goals Cognitive deficits Barriers/Prognosis Comment -- CHL IP DIET RECOMMENDATION 10/06/2019 SLP Diet Recommendations Pudding thick liquid;Dysphagia 1 (Puree) solids Liquid Administration via -- Medication Administration Via alternative means Compensations  Chin tuck Postural Changes --   CHL IP OTHER RECOMMENDATIONS 10/04/2019 Recommended Consults -- Oral Care Recommendations -- Other Recommendations Have oral suction available   CHL IP FOLLOW  UP RECOMMENDATIONS 10/06/2019 Follow up Recommendations Skilled Nursing facility   [facility] IP FREQUENCY AND DURATION 10/06/2019 Speech Therapy Frequency (ACUTE ONLY) min 2x/week Treatment Duration 2 weeks      CHL IP ORAL PHASE 10/06/2019 Oral Phase Impaired Oral - Pudding Teaspoon -- Oral - Pudding Cup -- Oral - Honey Teaspoon Lingual pumping;Reduced posterior propulsion;Delayed oral transit;Decreased bolus cohesion;Premature spillage Oral - Honey Cup -- Oral - Nectar Teaspoon Lingual pumping;Reduced posterior propulsion;Delayed oral transit;Decreased bolus cohesion;Premature spillage Oral - Nectar Cup -- Oral - Nectar Straw NT Oral - Thin Teaspoon NT Oral - Thin Cup -- Oral - Thin Straw NT Oral - Puree Lingual pumping;Reduced posterior propulsion;Delayed oral transit;Decreased bolus cohesion;Premature spillage Oral - Mech Soft NT Oral - Regular -- Oral - Multi-Consistency -- Oral - Pill -- Oral Phase - Comment --  CHL IP PHARYNGEAL PHASE 10/06/2019 Pharyngeal Phase Impaired Pharyngeal- Pudding Teaspoon -- Pharyngeal -- Pharyngeal- Pudding Cup -- Pharyngeal -- Pharyngeal- Honey Teaspoon Delayed swallow initiation-pyriform sinuses;Delayed swallow initiation-vallecula;Penetration/Aspiration before swallow;Reduced epiglottic inversion;Pharyngeal residue - valleculae;Moderate aspiration;Compensatory strategies attempted (with notebox) Pharyngeal Material enters airway, passes BELOW cords without attempt by patient to eject out (silent aspiration);Material does not enter airway Pharyngeal- Honey Cup -- Pharyngeal -- Pharyngeal- Nectar Teaspoon Delayed swallow initiation-pyriform sinuses;Penetration/Aspiration before swallow;Reduced epiglottic inversion;Pharyngeal residue - valleculae;Moderate aspiration Pharyngeal Material enters airway,  passes BELOW cords without attempt by patient to eject out (silent aspiration) Pharyngeal- Nectar Cup NT Pharyngeal -- Pharyngeal- Nectar Straw NT Pharyngeal -- Pharyngeal- Thin Teaspoon NT Pharyngeal -- Pharyngeal- Thin Cup -- Pharyngeal -- Pharyngeal- Thin Straw NT Pharyngeal -- Pharyngeal- Puree Delayed swallow initiation-vallecula;Pharyngeal residue - valleculae;Reduced epiglottic inversion Pharyngeal Material does not enter airway Pharyngeal- Mechanical Soft NT Pharyngeal -- Pharyngeal- Regular -- Pharyngeal -- Pharyngeal- Multi-consistency -- Pharyngeal -- Pharyngeal- Pill -- Pharyngeal -- Pharyngeal Comment --  No flowsheet data found. Herbie Baltimore, MA CCC-SLP Acute Rehabilitation Services Pager [phone] Office [phone] Lynann Beaver 10/06/2019, 10:19 AM               Scheduled Meds: . chlorhexidine gluconate (MEDLINE KIT)  15 mL Mouth Rinse BID  . Chlorhexidine Gluconate Cloth  6 each Topical Daily  . darbepoetin (ARANESP) injection - NON-DIALYSIS  150 mcg Subcutaneous Q Thu-1800  . docusate  100 mg Per Tube BID  . free water  300 mL Per Tube Q4H  . insulin aspart  0-9 Units Subcutaneous Q4H  . mouth rinse  15 mL Mouth Rinse q12n4p  . pantoprazole sodium  40 mg Per Tube Daily  . pneumococcal 23 valent vaccine  0.5 mL Intramuscular Tomorrow-1000  . polyethylene glycol  17 g Per Tube BID    Continuous Infusions: . sodium chloride Stopped (10/05/19 2000)  . argatroban 1.45 mcg/kg/min (10/06/19 0537)  . dextrose Stopped (10/05/19 1610)  . feeding supplement (OSMOLITE 1.2 CAL) Stopped (10/06/19 0932)  . meropenem (MERREM) IV Stopped (10/05/19 1331)     Flora Lipps, MD  [facility] 10/06/2019

## 2019-10-06 NOTE — Progress Notes (Signed)
Modified Barium Swallow Progress Note  Patient Details  Name: Ryan Villa MRN: [id number] Date of Birth: 02-27-1956  Today's Date: 10/06/2019  Modified Barium Swallow completed.  Full report located under Chart Review in the Imaging Section.  Brief recommendations include the following:  Clinical Impression  Pts function is similiar in nature to prior MBS, but worsened in severeity with further deconditioning. Pt has slow lingual pumping and premature spillage with delayed swallow initiation. There is silent aspiration of moderate quantity with nectar and honey teaspoon. There is typically a very delayed congested cough when aspirate descends into the trachea. He cannot elicit a dry swallow without prolonged lingual pumping which makes a "clear throat and swallow" strategy pointless as he just reaspirates what he cleared while he tries to swallow. He was successful with puree, even with a heaping spoon. A chin tuck is very beneficial in increased epiglottic deflection, reducing vallecular residue and capturing a 1/2 teaspoon of honey thick prior to the swallow. HOWEVER, this must be done with 100% accuracy or pt will aspirate. Pt needs max cues for a deep chin tuck and the quantity of honey thick must be 1/2 teaspoon. And given silent aspiration it is difficult to determine success of this method subjectively. For this reason, recommend pt continue with Cortrak, consuming pudding/puree only (no liquids). SLP only can offer liquid trials.    Swallow Evaluation Recommendations       SLP Diet Recommendations: Pudding thick liquid;Dysphagia 1 (Puree) solids (no actual liquids - pudding and puree only)       Medication Administration: Via alternative means   Supervision: Full assist for feeding   Compensations: Chin tuck - deep, all the way to chest               Herbie Baltimore, MA [place] Pager [phone] Office [phone]   Lynann Beaver 10/06/2019,10:16 AM

## 2019-10-07 LAB — RENAL FUNCTION PANEL
Albumin: 1.7 g/dL — ABNORMAL LOW (ref 3.5–5.0)
Anion gap: 7 (ref 5–15)
BUN: 76 mg/dL — ABNORMAL HIGH (ref 8–23)
CO2: 25 mmol/L (ref 22–32)
Calcium: 7.2 mg/dL — ABNORMAL LOW (ref 8.9–10.3)
Chloride: 112 mmol/L — ABNORMAL HIGH (ref 98–111)
Creatinine, Ser: 5.66 mg/dL — ABNORMAL HIGH (ref 0.61–1.24)
GFR calc Af Amer: 11 mL/min — ABNORMAL LOW (ref >60)
GFR calc non Af Amer: 10 mL/min — ABNORMAL LOW (ref >60)
Glucose, Bld: 169 mg/dL — ABNORMAL HIGH (ref 70–99)
Phosphorus: 4.2 mg/dL (ref 2.5–4.6)
Potassium: 4.8 mmol/L (ref 3.5–5.1)
Sodium: 144 mmol/L (ref 135–145)

## 2019-10-07 LAB — CBC
HCT: 30.7 % — ABNORMAL LOW (ref 39.0–52.0)
Hemoglobin: 9.5 g/dL — ABNORMAL LOW (ref 13.0–17.0)
MCH: 30.2 pg (ref 26.0–34.0)
MCHC: 30.9 g/dL (ref 30.0–36.0)
MCV: 97.5 fL (ref 80.0–100.0)
Platelets: 150 10*3/uL (ref 150–400)
RBC: 3.15 MIL/uL — ABNORMAL LOW (ref 4.22–5.81)
RDW: 14.3 % (ref 11.5–15.5)
WBC: 6.7 10*3/uL (ref 4.0–10.5)
nRBC: 0 % (ref 0.0–0.2)

## 2019-10-07 LAB — GLUCOSE, CAPILLARY
Glucose-Capillary: 140 mg/dL — ABNORMAL HIGH (ref 70–99)
Glucose-Capillary: 171 mg/dL — ABNORMAL HIGH (ref 70–99)
Glucose-Capillary: 200 mg/dL — ABNORMAL HIGH (ref 70–99)
Glucose-Capillary: 125 mg/dL — ABNORMAL HIGH (ref 70–99)
Glucose-Capillary: 148 mg/dL — ABNORMAL HIGH (ref 70–99)

## 2019-10-07 LAB — APTT: aPTT: 71 seconds — ABNORMAL HIGH (ref 24–36)

## 2019-10-07 NOTE — Progress Notes (Signed)
PT Cancellation Note  Patient Details Name: Ryan Villa MRN: [id number] DOB: 04/02/1956   Cancelled Treatment:    Reason Eval/Treat Not Completed: (P) Other (comment)(Pt soiled and RN cleaning patient, will defer tx and this time.)   Cristela Blue 10/07/2019, 4:22 PM Erasmo Leventhal , PTA Acute Rehabilitation Services Pager [phone] Office [phone]

## 2019-10-07 NOTE — NC FL2 (Signed)
[place] LEVEL OF CARE SCREENING TOOL     IDENTIFICATION  Patient Name: Ryan Villa Birthdate: 1956-08-11 Sex: male Admission Date (Current Location): 09/15/2019  [facility] and [place] Number:  Herbalist and Address:  [facility] [place] [address], [place], [place] [address]      Provider Number: [id number]  Attending Physician Name and Address:  Flora Lipps, MD  Relative Name and Phone Number:  Jantzen Pilger [phone]    Current Level of Care: Hospital Recommended Level of Care: [place] Prior Approval Number:    Date Approved/Denied:   PASRR Number: [id number]  Discharge Plan: SNF    Current Diagnoses: Patient Active Problem List   Diagnosis Date Noted  . Aspiration into airway   . Fracture   . Encounter for feeding tube placement   . Protein-calorie malnutrition, severe 09/17/2019  . Acute respiratory failure ([place]) 09/15/2019  . AKI (acute kidney injury) ([place]) 09/15/2019  . Rhabdomyolysis 09/15/2019  . Acute renal failure (ARF) ([place]) 09/15/2019  . Pressure injury of skin 09/15/2019  . Closed fracture of right proximal humerus 09/15/2019  . Carotid stenosis, left 04/28/2014  . Stroke ([place]) 04/28/2014  . Diabetes ([place]) 11/03/2013  . HTN (hypertension) 11/03/2013  . Hyperlipidemia with target low density lipoprotein (LDL) cholesterol less than 70 mg/dL 11/03/2013  . ICH (intracerebral hemorrhage) ([place]) 07/18/2013    Orientation RESPIRATION BLADDER Height & Weight     Time, Self  Normal Indwelling catheter Weight: 149 lb 4 oz (67.7 kg) Height:  '5\' 8"'  (172.7 cm)  BEHAVIORAL SYMPTOMS/MOOD NEUROLOGICAL BOWEL NUTRITION STATUS      Incontinent Diet(See discharge summary)  AMBULATORY STATUS COMMUNICATION OF NEEDS Skin   Extensive Assist Verbally Surgical wounds, Skin abrasions(Surgical wounds, left knee, Amputation Right leg; Skin tear right arm; Eccymosis Back, right arm, left leg)                        Personal Care Assistance Level of Assistance  Bathing, Feeding, Dressing Bathing Assistance: Maximum assistance Feeding assistance: Maximum assistance Dressing Assistance: Maximum assistance     Functional Limitations Info  Speech, Sight, Hearing(Hoarse) Sight Info: Adequate Hearing Info: Adequate Speech Info: Adequate    SPECIAL CARE FACTORS FREQUENCY  PT (By licensed PT), OT (By licensed OT), Speech therapy     PT Frequency: 5x week OT Frequency: 5x week     Speech Therapy Frequency: 5x week      Contractures Contractures Info: Not present    Additional Factors Info  Code Status, Allergies, Insulin Sliding Scale Code Status Info: Full Allergies Info: Heparin   Insulin Sliding Scale Info: Insulin Aspart Novolog 0-9 units every 4 hours       Current Medications (10/07/2019):  This is the current hospital active medication list Current Facility-Administered Medications  Medication Dose Route Frequency Provider Last Rate Last Admin  . 0.9 %  sodium chloride infusion  250 mL Intravenous Continuous Jacalyn Lefevre, MD   Stopped at 10/05/19 2000  . acetaminophen (TYLENOL) tablet 650 mg  650 mg Per Tube Q4H PRN Nicholes Stairs, MD      . argatroban 1 mg/mL infusion  1.45 mcg/kg/min Intravenous Continuous Tyrone Apple, RPH 6.06 mL/hr at 10/07/19 0717 1.45 mcg/kg/min at 10/07/19 0717  . bisacodyl (DULCOLAX) suppository 10 mg  10 mg Rectal Daily PRN Nicholes Stairs, MD   10 mg at 10/02/19 1530  . chlorhexidine gluconate (MEDLINE KIT) ([place])  0.12 % solution 15 mL  15 mL Mouth Rinse BID Anders Simmonds, MD   15 mL at 10/07/19 0858  . Chlorhexidine Gluconate Cloth 2 % PADS 6 each  6 each Topical Daily Nicholes Stairs, MD   6 each at 10/07/19 [phone]  . Darbepoetin Alfa (ARANESP) injection 150 mcg  150 mcg Subcutaneous Q Thu-1800 Justin Mend, MD   150 mcg at 09/30/19 2314  . dextrose 5 % solution   Intravenous Continuous Pokhrel, Laxman, MD 50  mL/hr at 10/07/19 1132 Rate Change at 10/07/19 1132  . docusate (COLACE) 50 MG/5ML liquid 100 mg  100 mg Per Tube BID Kipp Brood, MD   100 mg at 10/07/19 0952  . feeding supplement (OSMOLITE 1.2 CAL) liquid 1,000 mL  1,000 mL Per Tube Continuous Rush Farmer, MD 70 mL/hr at 10/06/19 1045 1,000 mL at 10/06/19 1045  . free water 300 mL  300 mL Per Tube Q4H Corliss Parish, MD   300 mL at 10/07/19 0857  . hydrALAZINE (APRESOLINE) injection 10 mg  10 mg Intravenous Q4H PRN Nicholes Stairs, MD   10 mg at 10/05/19 1729  . insulin aspart (novoLOG) injection 0-9 Units  0-9 Units Subcutaneous Q4H Desai, Rahul P, PA-C   2 Units at 10/07/19 0858  . ipratropium-albuterol (DUONEB) 0.5-2.5 (3) MG/3ML nebulizer solution 3 mL  3 mL Nebulization Q4H PRN Bodenheimer, Charles A, NP      . MEDLINE mouth rinse  15 mL Mouth Rinse q12n4p Rush Farmer, MD   15 mL at 10/06/19 1650  . meropenem (MERREM) 500 mg in sodium chloride 0.9 % 100 mL IVPB  500 mg Intravenous Q24H Rush Farmer, MD 200 mL/hr at 10/06/19 1330 500 mg at 10/06/19 1330  . ondansetron (ZOFRAN) injection 4 mg  4 mg Intravenous Q6H PRN Nicholes Stairs, MD   4 mg at 10/01/19 0426  . pantoprazole sodium (PROTONIX) 40 mg/20 mL oral suspension 40 mg  40 mg Per Tube Daily Kipp Brood, MD   40 mg at 10/07/19 0952  . pneumococcal 23 valent vaccine (PNEUMOVAX-23) injection 0.5 mL  0.5 mL Intramuscular Tomorrow-1000 Rush Farmer, MD      . polyethylene glycol (MIRALAX / GLYCOLAX) packet 17 g  17 g Per Tube BID Kipp Brood, MD   17 g at 10/07/19 0952  . Resource ThickenUp Clear   Oral PRN Flora Lipps, MD         Discharge Medications: Please see discharge summary for a list of discharge medications.  Relevant Imaging Results:  Relevant Lab Results:   Additional Information ss # [place], Student-Social Work

## 2019-10-07 NOTE — Progress Notes (Signed)
  Speech Language Pathology Treatment: Dysphagia  Patient Details Name: Ryan Villa MRN: [id number] DOB: 1956/06/27 Today's Date: 10/07/2019 Time: [id number] SLP Time Calculation (min) (ACUTE ONLY): 17 min  Assessment / Plan / Recommendation Clinical Impression  Per chart and discussion with RN, pt had overt signs of difficulty with breakfast meal, including coughing and desaturations. It sounds like pt was self-feeding breakfast with staff in the room. Given impulsivity, reduced awareness, and decreased carryover of chin tuck strategy needed, I think he requires a greater amount of assistance than this. SLP provided Total faded to Max cues for use of chin tuck with pureed solids. Pt tries to take more bites before he swallows and in the time that he is working to propel the bolus posteriorly he routinely brings his head back up, looking around the room. Even with heavy, hands-on assist from SLP, pt still exhibited prolonged coughing after eating ~3 ounces of applesauce. Recommend trials of puree with SLP to facilitate upgrade back to pureed diet if he is able to use strategies more consistently, ideally without as much assistance. In the mean time would keep Cortrak.    HPI HPI: 64 year old male transferred from [facility] for acute respiratory failure, rhabdomyolysis (fell and laid on floor 3 days), acute kidney injury. PMH: CKD stage IV, diabetes mellitus type 2, hyperlipidemia, hypertension, left carotid stenosis, intracerebral hemorrhage, status post right BKA. Per chart EMS reported that the patient's sister was bringing him food while he was lying on the floor during that 3-day period. Intubated 12/23-12/24 (self extubated). Found to have right humeral neck fracture and right upper lobe consolidation per chest x-ray, head CT no acute intracranial process,   Pt underwent MBS 12/28 with findings of severe dysphagia - asp of nectar/honey thick.  Repeat MBS 1/2 recommended Dys 1 diet and nectar  thick liquids with chin tuck. On 1/8 pt had vomiting and suspected aspiration requiring reintubation; extuabted 1/11.      SLP Plan  Continue with current plan of care       Recommendations  Diet recommendations: NPO Medication Administration: Via alternative means                Oral Care Recommendations: Oral care QID Follow up Recommendations: Skilled Nursing facility SLP Visit Diagnosis: Dysphagia, oropharyngeal phase (R13.12) Plan: Continue with current plan of care       GO                 Osie Bond., M.A. [place] [facility] Pager [phone] Office [phone]  10/07/2019, 10:53 AM

## 2019-10-07 NOTE — Progress Notes (Signed)
[place] for Argatroban  Indication: HIT   Allergies  Allergen Reactions  . 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    Estimated Creatinine Clearance: 12.8 mL/min (A) (by C-G formula based on SCr of 5.66 mg/dL (H)).   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 Heparin was stopped, and SRA was positive on 09/22/19.  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 The guidelines are unclear on the intended length of therapy with the patient not having a confirmed thrombosis, but could consider continuing anticoagulation for 4 weeks. The patient could eventually transition to oral anticoagulation.   aPTT this morning remains therapeutic at 71 sec. No bleeding reported; he is on Aranesp for anemia - Hg 9.5. Platelet count is fluctuating, 150 today.  Goal of Therapy:  aPTT 50-90 seconds Monitor platelets by anticoagulation protocol: Yes    Plan:  Continue argatroban at 1.45 mc/kg/min Monitor daily aPTT and CBC, s/sx bleeding F/U transition to Eliquis pending SLP eval  Lakynn Halvorsen A. Levada Dy, PharmD, BCPS, FNKF Clinical Pharmacist [place] Please utilize Amion for appropriate phone number to reach the unit pharmacist ([place])

## 2019-10-07 NOTE — TOC Initial Note (Signed)
Transition of Care [facility]) - Initial/Assessment Note    Patient Details  Name: Ryan Villa MRN: [id number] Date of Birth: 1956/01/16  Transition of [facility]) CM/SW Contact:    Kirstie Peri, [place] Work Phone Number: 10/07/2019, 12:21 PM  Clinical Narrative:                 MSW intern determined that PT was disoriented and unable to participate in goal setting at this time. MSW Intern reached out to PT's mother, Inez Catalina. She noted that PT has been living with her for the last few months, but does have his own apartment. She stated that PT has a wheelchair, cane and walker at home.  Mother was agreeable to SNF and requested a placement in [place]. She specifically wanted [facility].  Pt worked up and faxed out. SW will continue to follow.  Expected Discharge Plan: Skilled Nursing Facility Barriers to Discharge: Continued Medical Work up   Patient Goals and CMS Choice Patient states their goals for this hospitalization and ongoing recovery are:: Patient unable to participate in goal setting due to altered mental status. CMS Medicare.gov Compare Post Acute Care list provided to:: Patient Represenative (must comment)(Betty Jenne Campus, mother)    Expected Discharge Plan and Services Expected Discharge Plan: [place] Choice: [place] Living arrangements for the past 2 months: Single Family Home                                      Prior Living Arrangements/Services Living arrangements for the past 2 months: Single Family Home Lives with:: Parents(Lives with mother at the moment, but has independent apartment.) Patient language and need for interpreter reviewed:: Yes Do you feel safe going back to the place where you live?: Yes      Need for Family Participation in Patient Care: Yes (Comment) Care giver support system in place?: Yes (comment) Current home services: DME Criminal Activity/Legal Involvement  Pertinent to Current Situation/Hospitalization: No - Comment as needed  Activities of Daily Living      Permission Sought/Granted Permission sought to share information with : Facility Art therapist granted to share information with : Yes, Verbal Permission Granted  Share Information with NAME: Sukhjit Fluckiger  Permission granted to share info w AGENCY: [facility]  Permission granted to share info w Relationship: Mother  Permission granted to share info w Contact Information: [phone]  Emotional Assessment Appearance:: Other (Comment Required(Unable to assess) Attitude/Demeanor/Rapport: Unable to Assess Affect (typically observed): Unable to Assess Orientation: : Oriented to Self, Oriented to  Time Alcohol / Substance Use: Not Applicable Psych Involvement: No (comment)  Admission diagnosis:  Acute renal failure (ARF) ([place]) [N17.9] Patient Active Problem List   Diagnosis Date Noted  . Aspiration into airway   . Fracture   . Encounter for feeding tube placement   . Protein-calorie malnutrition, severe 09/17/2019  . Acute respiratory failure ([place]) 09/15/2019  . AKI (acute kidney injury) ([place]) 09/15/2019  . Rhabdomyolysis 09/15/2019  . Acute renal failure (ARF) ([place]) 09/15/2019  . Pressure injury of skin 09/15/2019  . Closed fracture of right proximal humerus 09/15/2019  . Carotid stenosis, left 04/28/2014  . Stroke ([place]) 04/28/2014  . Diabetes ([place]) 11/03/2013  . HTN (hypertension) 11/03/2013  . Hyperlipidemia with target low density lipoprotein (LDL) cholesterol less than 70 mg/dL 11/03/2013  . ICH (intracerebral hemorrhage) ([place])  07/18/2013   PCP:  Maryella Shivers, MD Pharmacy:   [place], [place] [address] Phone: [phone] Fax: [place], [place] [address] Phone:  [phone] Fax: [phone]     Social Determinants of Health (SDOH) Interventions    Readmission Risk Interventions No flowsheet data found.

## 2019-10-07 NOTE — Progress Notes (Signed)
PROGRESS NOTE  Ryan Villa PXT:[id number] DOB: 06/11/1956 DOA: 09/15/2019 PCP: Maryella Shivers, MD   LOS: 22 days   Brief narrative: As per HPI,  64 year old male was transferred from [facility] 12/23 for acute respiratory failure, rhabdomyolysis, acute kidney injury due to services unavailable at that facility.  He was in the ICU intubated and self extubated on 12/25. Transferred out of ICU 12/27. The patient was taken for right shoulder reverse arthroplasty 1/5. Early AM, 1/8 had vomiting with probable aspiration. Placed on NRB and transferred to ICU for intubation 10/01/2019. He was weaned to extubation on 10/04/2019. He has been evaluated by SLP and has a Cortrak tube in place.  Assessment/Plan:  Principal Problem:   Closed fracture of right proximal humerus Active Problems:   Acute respiratory failure (HCC)   AKI (acute kidney injury) ([place])   Rhabdomyolysis   Acute renal failure (ARF) (HCC)   Pressure injury of skin   Protein-calorie malnutrition, severe   Encounter for feeding tube placement   Aspiration into airway   Fracture  Acute hypoxic respiratory failure from Pseudomonal HCAP.  Extubated on 10/04/2019 and was transferred out of the ICU.  Currently on  IV meropenem.  Was previously on cefepime.  Patient was seen by speech therapy who recommended dysphagia 1 diet on 10/06/19.  Continue flutter valve.[name]   Dysphagia and vomiting leading to aspiration pneumonia: Status post modified barium swallow.  Cortrak tube in place and patient has been seen by speech therapy  and recommend dysphagia 1 diet.  Patient had mild hypoxia and had gurgling sound after oral intake today so has been kept n.p.o. again.  Will need to have speech see the patient again today.  On D5 water. Had repeat CXR on 1/13 without active disease.  Continue aspiration precautions.  Severe protein calorie malnutrtion: Present on admission.  Continue tube feeding.  Reassess for swallowing closely monitor.   Nutrition on board.  On D5 water.  Will decrease to 50 mils per hour.  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 Latest creatinine of 5.6 from 6.1, improving nephrology.  Baseline creatinine around 5.  No plans for HD so far.   Hyperkalemia.  Received Lokelma.  Hyperkalemia has resolved at this time.  Latest potassium of 3.8.  Hypernatremia: Mild.  We will continue with free water flushes.  Check BMP in a.m.  Anemia of critical illness and chronic disease: We will continue to monitor. Hemoglobin of 9.5 today.  Transfuse if less than 7.  Hyperglycemia: Continue SSI q4hrs. latest POC glucose was 135  Heparin-induced thrombocytopenia. Continue argatroban. transition to eliquiswhen more stable.  Platelet count of 150 today.   Essential Hypertension: off medications. Monitor closely.  Latest blood pressure 148/79.  Right proximal humerus fracture status post reverse shoulder arthroplasty 1/5:  Orthopedics on board.  VTE Prophylaxis: Argatroban  Code Status: Full code  Family Communication: I spoke with the patient's mother Aceton Kinnear on the phone today and updated her about the clinical condition of the patient.  Answered queries she had.  Disposition Plan:  Skilled nursing facility placement as per physical therapy evaluation.  Barriers to discharge include pending clinical improvement including improvement in oral intake, dysphagia with concern for repeated aspiration, renal failure, pseudomonal pneumonia with need for IV antibiotics.   Consultants:  Orthopedic surgery  Wound Care  Nephrology  PCCM  Procedures:  Intubation x 2  ORIF of right shoulder  CorTrak tube placement  Antibiotics: Meropenem 1/11>  Anti-infectives (From admission, onward)   Start  Dose/Rate Route Frequency Ordered Stop   10/04/19 1230  meropenem (MERREM) 500 mg in sodium chloride 0.9 % 100 mL IVPB     500 mg 200 mL/hr over 30 Minutes Intravenous Every 24 hours 10/04/19 1213     10/04/19 1000  ceFEPIme  (MAXIPIME) 1 g in sodium chloride 0.9 % 100 mL IVPB  Status:  Discontinued     1 g 200 mL/hr over 30 Minutes Intravenous Every 24 hours 10/03/19 0921 10/04/19 1213   10/03/19 0930  ceFEPIme (MAXIPIME) 1 g in sodium chloride 0.9 % 100 mL IVPB     1 g 200 mL/hr over 30 Minutes Intravenous STAT 10/03/19 0921 10/03/19 1100   10/03/19 0800  cefTAZidime (FORTAZ) 0.5 g in dextrose 5 % 50 mL IVPB  Status:  Discontinued     0.5 g 100 mL/hr over 30 Minutes Intravenous Every 24 hours 10/03/19 0705 10/03/19 0921   10/01/19 0800  Ampicillin-Sulbactam (UNASYN) 3 g in sodium chloride 0.9 % 100 mL IVPB  Status:  Discontinued     3 g 200 mL/hr over 30 Minutes Intravenous Every 12 hours 10/01/19 0640 10/03/19 0655   09/28/19 2030  ceFAZolin (ANCEF) IVPB 1 g/50 mL premix     1 g 100 mL/hr over 30 Minutes Intravenous Every 12 hours 09/28/19 1624 09/29/19 1910   09/28/19 1157  vancomycin (VANCOCIN) powder  Status:  Discontinued       As needed 09/28/19 1158 09/28/19 1457   09/28/19 0600  vancomycin (VANCOCIN) IVPB 1000 mg/200 mL premix  Status:  Discontinued     1,000 mg 200 mL/hr over 60 Minutes Intravenous On call to O.R. 09/27/19 1159 09/28/19 1607   09/28/19 0000  ceFAZolin (ANCEF) IVPB 2g/100 mL premix    Note to Pharmacy: Surgery delayed until 1/5.   2 g 200 mL/hr over 30 Minutes Intravenous To Surgery 09/22/19 0904 09/28/19 1222   09/22/19 0815  ceFAZolin (ANCEF) IVPB 2g/100 mL premix  Status:  Discontinued     2 g 200 mL/hr over 30 Minutes Intravenous To Surgery 09/22/19 0806 09/22/19 0905   09/15/19 1715  azithromycin (ZITHROMAX) 500 mg in sodium chloride 0.9 % 250 mL IVPB  Status:  Discontinued     500 mg 250 mL/hr over 60 Minutes Intravenous Every 24 hours 09/15/19 1711 09/18/19 1107   09/15/19 1715  cefTRIAXone (ROCEPHIN) 1 g in sodium chloride 0.9 % 100 mL IVPB     1 g 200 mL/hr over 30 Minutes Intravenous Every 24 hours 09/15/19 1711 09/21/19 1754     Subjective: Today, patient was noted  be gurgling and was hypoxic during eating. Patient denies chest pain or shortness of breath.  Objective: Vitals:   10/07/19 0414 10/07/19 0752  BP: (!) 150/79 (!) 148/79  Pulse: 66 72  Resp: (!) 24 20  Temp: 98.3 F (36.8 C) 98.7 F (37.1 C)  SpO2: 92% 95%    Intake/Output Summary (Last 24 hours) at 10/07/2019 0805 Last data filed at 10/07/2019 0543 Gross per 24 hour  Intake --  Output 800 ml  Net -800 ml   Filed Weights   10/04/19 0500 10/06/19 0500 10/07/19 0500  Weight: 61.9 kg 64.3 kg 67.7 kg   Body mass index is 22.69 kg/m.   Physical Exam:  GENERAL: Patient is alert awake and comprehends conversation.  Appears deconditioned and ill.  Thinly built.  HENT: No scleral pallor or icterus. Pupils equally reactive to light. Oral mucosa is mildly dry.  Cortrak tube in place NECK: is  supple, no palpable thyroid enlargement. CHEST:Diminished breath sounds bilaterally. Crackles at bases. CVS: S1 and S2 heard, no murmur. Regular rate and rhythm. No pericardial rub. ABDOMEN: Soft, non-tender, bowel sounds are present. EXTREMITIES: No edema the left lower extremity.  Right below-knee amputation.  Left shoulder with staples from recent surgery.  Incision site clean dry and intact. CNS: Cranial nerves are intact.  Generalized weakness noted.  Able to comprehend and respond. SKIN: warm and dry without rashes.  Left shoulder incision site clean dry and intact.  Data Review: I have personally reviewed the following laboratory data and studies,  CBC: Recent Labs  Lab 10/03/19 0137 10/04/19 0536 10/05/19 0549 10/06/19 0225 10/07/19 0341  WBC 7.1 7.0 4.3 5.9 6.7  HGB 8.1* 9.3* 10.8* 10.1* 9.5*  HCT 26.1* 31.1* 36.1* 32.3* 30.7*  MCV 98.9 99.7 101.4* 98.2 97.5  PLT 115* 132* 142* 138* 833   Basic Metabolic Panel: Recent Labs  Lab 10/03/19 0137 10/04/19 0536 10/05/19 0549 10/06/19 0225 10/07/19 0341  NA 149* 154* 152* 147* 144  K 4.5 4.1 3.7 3.8 4.8  CL 118* 118* 114*  114* 112*  CO2 18* 20* '24 22 25  ' GLUCOSE 91 89 157* 206* 169*  BUN 98* 101* 88* 76* 76*  CREATININE 8.28* 8.04* 7.18* 6.13* 5.66*  CALCIUM 7.6* 7.7* 7.6* 7.2* 7.2*  MG 2.0  --  2.0  --   --   PHOS 7.1* 6.5* 5.8* 4.0 4.2   Liver Function Tests: Recent Labs  Lab 10/03/19 0137 10/04/19 0536 10/05/19 0549 10/06/19 0225 10/07/19 0341  ALBUMIN 1.6* 1.7* 1.8* 1.7* 1.7*   No results for input(s): LIPASE, AMYLASE in the last 168 hours. No results for input(s): AMMONIA in the last 168 hours. Cardiac Enzymes: No results for input(s): CKTOTAL, CKMB, CKMBINDEX, TROPONINI in the last 168 hours. BNP (last 3 results) Recent Labs    09/15/19 1822  BNP 61.3    ProBNP (last 3 results) No results for input(s): PROBNP in the last 8760 hours.  CBG: Recent Labs  Lab 10/06/19 1626 10/06/19 2054 10/06/19 2342 10/07/19 0413 10/07/19 0756  GLUCAP 192* 169* 140* 140* 171*   Recent Results (from the past 240 hour(s))  Surgical pcr screen     Status: Abnormal   Collection Time: 09/28/19  6:36 AM   Specimen: Nasal Mucosa; Nasal Swab  Result Value Ref Range Status   MRSA, PCR POSITIVE (A) NEGATIVE Final    Comment: RESULT CALLED TO, READ BACK BY AND VERIFIED WITH: Tawni Levy RN 9:00 09/28/19 ([email or web address])    Staphylococcus aureus POSITIVE (A) NEGATIVE Final    Comment: (NOTE) The Xpert SA Assay (FDA approved for NASAL specimens in patients 6 years of age and older), is one component of a comprehensive surveillance program. It is not intended to diagnose infection nor to guide or monitor treatment. Performed at [facility], [place] [address] [place], [place] [address]   Culture, respiratory (non-expectorated)     Status: None   Collection Time: 10/01/19 12:10 PM   Specimen: Tracheal Aspirate; Respiratory  Result Value Ref Range Status   Specimen Description TRACHEAL ASPIRATE  Final   Special Requests NONE  Final   Gram Stain   Final    FEW WBC PRESENT,BOTH PMN AND  MONONUCLEAR RARE GRAM POSITIVE COCCI IN PAIRS Performed at [facility], [place] [address], [place], [place] [address]    Culture   Final    FEW PSEUDOMONAS AERUGINOSA FEW ENTEROBACTER CLOACAE    Report  Status 10/04/2019 FINAL  Final   Organism ID, Bacteria PSEUDOMONAS AERUGINOSA  Final   Organism ID, Bacteria ENTEROBACTER CLOACAE  Final      Susceptibility   Enterobacter cloacae - MIC*    CEFAZOLIN >=64 RESISTANT Resistant     CEFEPIME 16 RESISTANT Resistant     CEFTAZIDIME >=64 RESISTANT Resistant     CIPROFLOXACIN <=0.25 SENSITIVE Sensitive     GENTAMICIN <=1 SENSITIVE Sensitive     IMIPENEM 1 SENSITIVE Sensitive     TRIMETH/SULFA <=20 SENSITIVE Sensitive     PIP/TAZO >=128 RESISTANT Resistant     * FEW ENTEROBACTER CLOACAE   Pseudomonas aeruginosa - MIC*    CEFTAZIDIME >=64 RESISTANT Resistant     CIPROFLOXACIN <=0.25 SENSITIVE Sensitive     GENTAMICIN 2 SENSITIVE Sensitive     IMIPENEM 1 SENSITIVE Sensitive     PIP/TAZO 32 SENSITIVE Sensitive     CEFEPIME 8 SENSITIVE Sensitive     * FEW PSEUDOMONAS AERUGINOSA     Studies: DG Chest Port 1 View  Result Date: 10/06/2019 CLINICAL DATA:  New cough.  Possible aspiration. EXAM: PORTABLE CHEST 1 VIEW COMPARISON:  Chest x-ray dated October 04, 2019. FINDINGS: Interval removal of the endotracheal tube. Feeding tube entering the stomach with the tip below the field of view. The heart size and mediastinal contours are within normal limits. Atherosclerotic calcification of the aortic arch. Normal pulmonary vascularity. Linear atelectasis in the right mid lung. No focal consolidation, pleural effusion, or pneumothorax. No acute osseous abnormality. Prior right reverse total shoulder arthroplasty. IMPRESSION: No active disease. Electronically Signed   By: Titus Dubin M.D.   On: 10/06/2019 19:06   DG Swallowing Func-Speech Pathology  Result Date: 10/06/2019 Objective Swallowing Evaluation: Type of Study: MBS-Modified Barium Swallow  Study  Patient Details Name: Ryan Villa MRN: [id number] Date of Birth: Mar 11, 1956 Today's Date: 10/06/2019 Time: SLP Start Time (ACUTE ONLY): 0935 -SLP Stop Time (ACUTE ONLY): 1000 SLP Time Calculation (min) (ACUTE ONLY): 25 min Past Medical History: No past medical history on file. Past Surgical History: Past Surgical History: Procedure Laterality Date . REVERSE SHOULDER ARTHROPLASTY Right 09/28/2019  Procedure: RIGHT REVERSE SHOULDER ARTHROPLASTY;  Surgeon: Nicholes Stairs, MD;  Location: [place];  Service: Orthopedics;  Laterality: Right; HPI: 64 year old male transferred from [facility] for acute respiratory failure, rhabdomyolysis (fell and laid on floor 3 days), acute kidney injury. PMH: CKD stage IV, diabetes mellitus type 2, hyperlipidemia, hypertension, left carotid stenosis, intracerebral hemorrhage, status post right BKA. Per chart EMS reported that the patient's sister was bringing him food while he was lying on the floor during that 3-day period. Intubated 12/23-12/24 (self extubated). Found to have right humeral neck fracture and right upper lobe consolidation per chest x-ray, head CT no acute intracranial process,   Pt underwent MBS 12/28 with findings of severe dysphagia - asp of nectar/honey thick.  Repeat MBS 1/2 recommended Dys 1 diet and nectar thick liquids with chin tuck. On 1/8 pt had vomiting and suspected aspiration requiring reintubation; extuabted 1/11.  Subjective: alert, needs cues Assessment / Plan / Recommendation CHL IP CLINICAL IMPRESSIONS 10/06/2019 Clinical Impression Pts function is similiar in nature to prior MBS, but worsened in severeity with further deconditioning. Pt has slow lingual pumping and premature spillage with delayed swallow initiation. There is silent aspiration of moderate quantity with nectar and honey teaspoon. There is typically a very delayed congested cough when aspirate descends into the trachea. He cannot elicit a dry swallow without prolonged lingual  pumping  which makes a "clear throat and swallow" strategy pointless as he just reaspirates what he cleared while he tries to swallow. He was successful with puree, even with a heaping spoon. A chin tuck is very beneficial in increased epiglottic deflection, reducing vallecular residue and capturing a 1/2 teaspoon of honey thick prior to the swallow. HOWEVER, this must be done with 100% accuracy or pt will aspirate. Pt needs max cues for a deep chin tuck and the quantity of honey thick must be 1/2 teaspoon. And given silent aspiration it is difficult to determine success of this method subjectively. For this reason, recommend pt continue with Cortrak, consuming pudding/puree only (no liquids). SLP only can offer liquid trials.  SLP Visit Diagnosis Dysphagia, oropharyngeal phase (R13.12) Attention and concentration deficit following -- Frontal lobe and executive function deficit following -- Impact on safety and function Moderate aspiration risk   CHL IP TREATMENT RECOMMENDATION 10/06/2019 Treatment Recommendations Therapy as outlined in treatment plan below   Prognosis 10/06/2019 Prognosis for Safe Diet Advancement Good Barriers to Reach Goals Cognitive deficits Barriers/Prognosis Comment -- CHL IP DIET RECOMMENDATION 10/06/2019 SLP Diet Recommendations Pudding thick liquid;Dysphagia 1 (Puree) solids Liquid Administration via -- Medication Administration Via alternative means Compensations Chin tuck Postural Changes --   CHL IP OTHER RECOMMENDATIONS 10/04/2019 Recommended Consults -- Oral Care Recommendations -- Other Recommendations Have oral suction available   CHL IP FOLLOW UP RECOMMENDATIONS 10/06/2019 Follow up Recommendations Skilled Nursing facility   [facility] IP FREQUENCY AND DURATION 10/06/2019 Speech Therapy Frequency (ACUTE ONLY) min 2x/week Treatment Duration 2 weeks      CHL IP ORAL PHASE 10/06/2019 Oral Phase Impaired Oral - Pudding Teaspoon -- Oral - Pudding Cup -- Oral - Honey Teaspoon Lingual pumping;Reduced  posterior propulsion;Delayed oral transit;Decreased bolus cohesion;Premature spillage Oral - Honey Cup -- Oral - Nectar Teaspoon Lingual pumping;Reduced posterior propulsion;Delayed oral transit;Decreased bolus cohesion;Premature spillage Oral - Nectar Cup -- Oral - Nectar Straw NT Oral - Thin Teaspoon NT Oral - Thin Cup -- Oral - Thin Straw NT Oral - Puree Lingual pumping;Reduced posterior propulsion;Delayed oral transit;Decreased bolus cohesion;Premature spillage Oral - Mech Soft NT Oral - Regular -- Oral - Multi-Consistency -- Oral - Pill -- Oral Phase - Comment --  CHL IP PHARYNGEAL PHASE 10/06/2019 Pharyngeal Phase Impaired Pharyngeal- Pudding Teaspoon -- Pharyngeal -- Pharyngeal- Pudding Cup -- Pharyngeal -- Pharyngeal- Honey Teaspoon Delayed swallow initiation-pyriform sinuses;Delayed swallow initiation-vallecula;Penetration/Aspiration before swallow;Reduced epiglottic inversion;Pharyngeal residue - valleculae;Moderate aspiration;Compensatory strategies attempted (with notebox) Pharyngeal Material enters airway, passes BELOW cords without attempt by patient to eject out (silent aspiration);Material does not enter airway Pharyngeal- Honey Cup -- Pharyngeal -- Pharyngeal- Nectar Teaspoon Delayed swallow initiation-pyriform sinuses;Penetration/Aspiration before swallow;Reduced epiglottic inversion;Pharyngeal residue - valleculae;Moderate aspiration Pharyngeal Material enters airway, passes BELOW cords without attempt by patient to eject out (silent aspiration) Pharyngeal- Nectar Cup NT Pharyngeal -- Pharyngeal- Nectar Straw NT Pharyngeal -- Pharyngeal- Thin Teaspoon NT Pharyngeal -- Pharyngeal- Thin Cup -- Pharyngeal -- Pharyngeal- Thin Straw NT Pharyngeal -- Pharyngeal- Puree Delayed swallow initiation-vallecula;Pharyngeal residue - valleculae;Reduced epiglottic inversion Pharyngeal Material does not enter airway Pharyngeal- Mechanical Soft NT Pharyngeal -- Pharyngeal- Regular -- Pharyngeal -- Pharyngeal-  Multi-consistency -- Pharyngeal -- Pharyngeal- Pill -- Pharyngeal -- Pharyngeal Comment --  No flowsheet data found. Herbie Baltimore, MA CCC-SLP Acute Rehabilitation Services Pager [phone] Office [phone] Lynann Beaver 10/06/2019, 10:19 AM               Scheduled Meds: . chlorhexidine gluconate (MEDLINE KIT)  15 mL Mouth Rinse BID  .  Chlorhexidine Gluconate Cloth  6 each Topical Daily  . darbepoetin (ARANESP) injection - NON-DIALYSIS  150 mcg Subcutaneous Q Thu-1800  . docusate  100 mg Per Tube BID  . free water  300 mL Per Tube Q4H  . insulin aspart  0-9 Units Subcutaneous Q4H  . mouth rinse  15 mL Mouth Rinse q12n4p  . pantoprazole sodium  40 mg Per Tube Daily  . pneumococcal 23 valent vaccine  0.5 mL Intramuscular Tomorrow-1000  . polyethylene glycol  17 g Per Tube BID    Continuous Infusions: . sodium chloride Stopped (10/05/19 2000)  . argatroban 1.45 mcg/kg/min (10/07/19 0717)  . dextrose 75 mL/hr at 10/06/19 2232  . feeding supplement (OSMOLITE 1.2 CAL) 1,000 mL (10/06/19 1045)  . meropenem (MERREM) IV 500 mg (10/06/19 1330)    Flora Lipps, MD  [facility] 10/07/2019

## 2019-10-07 NOTE — Progress Notes (Signed)
[facility] Progress Note    Assessment/Plan: **CKD 5:  Baseline Cr ~5 -  Recurrent AKI secondary to acute hypoxic event 1/8, cr up to 8.28.   But now seems to be trending back toward his baseline again.  Dr. Johnney Ou has contacted pts mother, she consents to him getting dialysis if needed. Now I have talked with the brother as well.  Unfortunately I feel he will not be a good HD patient- will likely be non compliant unless in a facility.  Thankfully no indications today - numbers cont to improve  **Acute hypoxic resp failure: secondary to aspiration, intubation; per PCCM.  Now extubated- failing swallowing study  **Hyperkalemia: resolved-   **Hypernatremia:  On free water - total of 1800 per 24 hours -   D5 at 75 per hour.  Sodium better - just in the normal range... cont both likely another 24 hours  **Anemia: improved following transfusion 1/7.  Iron replete 1 week ago. On ESA, dose increased on 1/7.  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   _______________________________________________________________________   Subjective:   Has at least 800  of UOP-  BUN , crt and sodium all down, failed swallowing- he now says his thirst is better-  Aspirates with anything so nurse has made him NPO again   Objective Vitals:   10/06/19 2331 10/07/19 0414 10/07/19 0500 10/07/19 0752  BP: 130/66 (!) 150/79  (!) 148/79  Pulse: 72 66  72  Resp: (!) 22 (!) 24  20  Temp: 98.4 F (36.9 C) 98.3 F (36.8 C)  98.7 F (37.1 C)  TempSrc: Oral Oral  Axillary  SpO2: 93% 92%  95%  Weight:   67.7 kg   Height:       Physical Exam General: slow MS -  Trouble with voice but seems to understand what I am saying to some extent  Heart: no rub Lungs: vent, clear ant and laterally Abdomen: soft, thin Extremities: no edema Neuro: nonfocal. No asterixis  Additional Objective Labs: Basic Metabolic Panel: Recent Labs  Lab 10/05/19 0549 10/06/19 0225  10/07/19 0341  NA 152* 147* 144  K 3.7 3.8 4.8  CL 114* 114* 112*  CO2 '24 22 25  ' GLUCOSE 157* 206* 169*  BUN 88* 76* 76*  CREATININE 7.18* 6.13* 5.66*  CALCIUM 7.6* 7.2* 7.2*  PHOS 5.8* 4.0 4.2   Liver Function Tests: Recent Labs  Lab 10/05/19 0549 10/06/19 0225 10/07/19 0341  ALBUMIN 1.8* 1.7* 1.7*   No results for input(s): LIPASE, AMYLASE in the last 168 hours. CBC: Recent Labs  Lab 10/03/19 0137 10/03/19 0137 10/04/19 0536 10/04/19 0536 10/05/19 0549 10/06/19 0225 10/07/19 0341  WBC 7.1   < > 7.0   < > 4.3 5.9 6.7  HGB 8.1*   < > 9.3*   < > 10.8* 10.1* 9.5*  HCT 26.1*   < > 31.1*   < > 36.1* 32.3* 30.7*  MCV 98.9  --  99.7  --  101.4* 98.2 97.5  PLT 115*   < > 132*   < > 142* 138* 150   < > = values in this interval not displayed.   Blood Culture    Component Value Date/Time   SDES TRACHEAL ASPIRATE 10/01/2019 1210   SPECREQUEST NONE 10/01/2019 1210   CULT  10/01/2019 1210    FEW PSEUDOMONAS AERUGINOSA FEW ENTEROBACTER CLOACAE    REPTSTATUS 10/04/2019 FINAL 10/01/2019 1210    Cardiac Enzymes: No results  for input(s): CKTOTAL, CKMB, CKMBINDEX, TROPONINI in the last 168 hours. CBG: Recent Labs  Lab 10/06/19 1626 10/06/19 2054 10/06/19 2342 10/07/19 0413 10/07/19 0756  GLUCAP 192* 169* 140* 140* 171*   Iron Studies: No results for input(s): IRON, TIBC, TRANSFERRIN, FERRITIN in the last 72 hours. '@[email or web address]' @ Studies/Results: DG Chest Port 1 View  Result Date: 10/06/2019 CLINICAL DATA:  New cough.  Possible aspiration. EXAM: PORTABLE CHEST 1 VIEW COMPARISON:  Chest x-ray dated October 04, 2019. FINDINGS: Interval removal of the endotracheal tube. Feeding tube entering the stomach with the tip below the field of view. The heart size and mediastinal contours are within normal limits. Atherosclerotic calcification of the aortic arch. Normal pulmonary vascularity. Linear atelectasis in the right mid lung. No focal consolidation, pleural effusion, or  pneumothorax. No acute osseous abnormality. Prior right reverse total shoulder arthroplasty. IMPRESSION: No active disease. Electronically Signed   By: Titus Dubin M.D.   On: 10/06/2019 19:06   DG Swallowing Func-Speech Pathology  Result Date: 10/06/2019 Objective Swallowing Evaluation: Type of Study: MBS-Modified Barium Swallow Study  Patient Details Name: Ryan Villa MRN: [id number] Date of Birth: Apr 27, 1956 Today's Date: 10/06/2019 Time: SLP Start Time (ACUTE ONLY): 0935 -SLP Stop Time (ACUTE ONLY): 1000 SLP Time Calculation (min) (ACUTE ONLY): 25 min Past Medical History: No past medical history on file. Past Surgical History: Past Surgical History: Procedure Laterality Date . REVERSE SHOULDER ARTHROPLASTY Right 09/28/2019  Procedure: RIGHT REVERSE SHOULDER ARTHROPLASTY;  Surgeon: Nicholes Stairs, MD;  Location: [place];  Service: Orthopedics;  Laterality: Right; HPI: 64 year old male transferred from [facility] for acute respiratory failure, rhabdomyolysis (fell and laid on floor 3 days), acute kidney injury. PMH: CKD stage IV, diabetes mellitus type 2, hyperlipidemia, hypertension, left carotid stenosis, intracerebral hemorrhage, status post right BKA. Per chart EMS reported that the patient's sister was bringing him food while he was lying on the floor during that 3-day period. Intubated 12/23-12/24 (self extubated). Found to have right humeral neck fracture and right upper lobe consolidation per chest x-ray, head CT no acute intracranial process,   Pt underwent MBS 12/28 with findings of severe dysphagia - asp of nectar/honey thick.  Repeat MBS 1/2 recommended Dys 1 diet and nectar thick liquids with chin tuck. On 1/8 pt had vomiting and suspected aspiration requiring reintubation; extuabted 1/11.  Subjective: alert, needs cues Assessment / Plan / Recommendation CHL IP CLINICAL IMPRESSIONS 10/06/2019 Clinical Impression Pts function is similiar in nature to prior MBS, but worsened in severeity  with further deconditioning. Pt has slow lingual pumping and premature spillage with delayed swallow initiation. There is silent aspiration of moderate quantity with nectar and honey teaspoon. There is typically a very delayed congested cough when aspirate descends into the trachea. He cannot elicit a dry swallow without prolonged lingual pumping which makes a "clear throat and swallow" strategy pointless as he just reaspirates what he cleared while he tries to swallow. He was successful with puree, even with a heaping spoon. A chin tuck is very beneficial in increased epiglottic deflection, reducing vallecular residue and capturing a 1/2 teaspoon of honey thick prior to the swallow. HOWEVER, this must be done with 100% accuracy or pt will aspirate. Pt needs max cues for a deep chin tuck and the quantity of honey thick must be 1/2 teaspoon. And given silent aspiration it is difficult to determine success of this method subjectively. For this reason, recommend pt continue with Cortrak, consuming pudding/puree only (no liquids). SLP only can offer liquid trials.  SLP Visit Diagnosis Dysphagia, oropharyngeal phase (R13.12) Attention and concentration deficit following -- Frontal lobe and executive function deficit following -- Impact on safety and function Moderate aspiration risk   CHL IP TREATMENT RECOMMENDATION 10/06/2019 Treatment Recommendations Therapy as outlined in treatment plan below   Prognosis 10/06/2019 Prognosis for Safe Diet Advancement Good Barriers to Reach Goals Cognitive deficits Barriers/Prognosis Comment -- CHL IP DIET RECOMMENDATION 10/06/2019 SLP Diet Recommendations Pudding thick liquid;Dysphagia 1 (Puree) solids Liquid Administration via -- Medication Administration Via alternative means Compensations Chin tuck Postural Changes --   CHL IP OTHER RECOMMENDATIONS 10/04/2019 Recommended Consults -- Oral Care Recommendations -- Other Recommendations Have oral suction available   CHL IP FOLLOW UP  RECOMMENDATIONS 10/06/2019 Follow up Recommendations Skilled Nursing facility   [facility] IP FREQUENCY AND DURATION 10/06/2019 Speech Therapy Frequency (ACUTE ONLY) min 2x/week Treatment Duration 2 weeks      CHL IP ORAL PHASE 10/06/2019 Oral Phase Impaired Oral - Pudding Teaspoon -- Oral - Pudding Cup -- Oral - Honey Teaspoon Lingual pumping;Reduced posterior propulsion;Delayed oral transit;Decreased bolus cohesion;Premature spillage Oral - Honey Cup -- Oral - Nectar Teaspoon Lingual pumping;Reduced posterior propulsion;Delayed oral transit;Decreased bolus cohesion;Premature spillage Oral - Nectar Cup -- Oral - Nectar Straw NT Oral - Thin Teaspoon NT Oral - Thin Cup -- Oral - Thin Straw NT Oral - Puree Lingual pumping;Reduced posterior propulsion;Delayed oral transit;Decreased bolus cohesion;Premature spillage Oral - Mech Soft NT Oral - Regular -- Oral - Multi-Consistency -- Oral - Pill -- Oral Phase - Comment --  CHL IP PHARYNGEAL PHASE 10/06/2019 Pharyngeal Phase Impaired Pharyngeal- Pudding Teaspoon -- Pharyngeal -- Pharyngeal- Pudding Cup -- Pharyngeal -- Pharyngeal- Honey Teaspoon Delayed swallow initiation-pyriform sinuses;Delayed swallow initiation-vallecula;Penetration/Aspiration before swallow;Reduced epiglottic inversion;Pharyngeal residue - valleculae;Moderate aspiration;Compensatory strategies attempted (with notebox) Pharyngeal Material enters airway, passes BELOW cords without attempt by patient to eject out (silent aspiration);Material does not enter airway Pharyngeal- Honey Cup -- Pharyngeal -- Pharyngeal- Nectar Teaspoon Delayed swallow initiation-pyriform sinuses;Penetration/Aspiration before swallow;Reduced epiglottic inversion;Pharyngeal residue - valleculae;Moderate aspiration Pharyngeal Material enters airway, passes BELOW cords without attempt by patient to eject out (silent aspiration) Pharyngeal- Nectar Cup NT Pharyngeal -- Pharyngeal- Nectar Straw NT Pharyngeal -- Pharyngeal- Thin Teaspoon NT  Pharyngeal -- Pharyngeal- Thin Cup -- Pharyngeal -- Pharyngeal- Thin Straw NT Pharyngeal -- Pharyngeal- Puree Delayed swallow initiation-vallecula;Pharyngeal residue - valleculae;Reduced epiglottic inversion Pharyngeal Material does not enter airway Pharyngeal- Mechanical Soft NT Pharyngeal -- Pharyngeal- Regular -- Pharyngeal -- Pharyngeal- Multi-consistency -- Pharyngeal -- Pharyngeal- Pill -- Pharyngeal -- Pharyngeal Comment --  No flowsheet data found. Herbie Baltimore, MA CCC-SLP Acute Rehabilitation Services Pager [phone] Office [phone] Lynann Beaver 10/06/2019, 10:19 AM              Medications: . sodium chloride Stopped (10/05/19 2000)  . argatroban 1.45 mcg/kg/min (10/07/19 0717)  . dextrose 75 mL/hr at 10/06/19 2232  . feeding supplement (OSMOLITE 1.2 CAL) 1,000 mL (10/06/19 1045)  . meropenem (MERREM) IV 500 mg (10/06/19 1330)   . chlorhexidine gluconate (MEDLINE KIT)  15 mL Mouth Rinse BID  . Chlorhexidine Gluconate Cloth  6 each Topical Daily  . darbepoetin (ARANESP) injection - NON-DIALYSIS  150 mcg Subcutaneous Q Thu-1800  . docusate  100 mg Per Tube BID  . free water  300 mL Per Tube Q4H  . insulin aspart  0-9 Units Subcutaneous Q4H  . mouth rinse  15 mL Mouth Rinse q12n4p  . pantoprazole sodium  40 mg Per Tube Daily  . pneumococcal 23 valent vaccine  0.5 mL  Intramuscular Tomorrow-1000  . polyethylene glycol  17 g Per Tube BID

## 2019-10-08 LAB — MAGNESIUM: Magnesium: 2 mg/dL (ref 1.7–2.4)

## 2019-10-08 LAB — GLUCOSE, CAPILLARY
Glucose-Capillary: 142 mg/dL — ABNORMAL HIGH (ref 70–99)
Glucose-Capillary: 143 mg/dL — ABNORMAL HIGH (ref 70–99)
Glucose-Capillary: 156 mg/dL — ABNORMAL HIGH (ref 70–99)
Glucose-Capillary: 164 mg/dL — ABNORMAL HIGH (ref 70–99)
Glucose-Capillary: 171 mg/dL — ABNORMAL HIGH (ref 70–99)
Glucose-Capillary: 173 mg/dL — ABNORMAL HIGH (ref 70–99)

## 2019-10-08 LAB — RENAL FUNCTION PANEL
Albumin: 1.6 g/dL — ABNORMAL LOW (ref 3.5–5.0)
Anion gap: 10 (ref 5–15)
BUN: 72 mg/dL — ABNORMAL HIGH (ref 8–23)
CO2: 20 mmol/L — ABNORMAL LOW (ref 22–32)
Calcium: 7.4 mg/dL — ABNORMAL LOW (ref 8.9–10.3)
Chloride: 110 mmol/L (ref 98–111)
Creatinine, Ser: 4.95 mg/dL — ABNORMAL HIGH (ref 0.61–1.24)
GFR calc Af Amer: 13 mL/min — ABNORMAL LOW (ref >60)
GFR calc non Af Amer: 12 mL/min — ABNORMAL LOW (ref >60)
Glucose, Bld: 162 mg/dL — ABNORMAL HIGH (ref 70–99)
Phosphorus: 4.8 mg/dL — ABNORMAL HIGH (ref 2.5–4.6)
Potassium: 4.9 mmol/L (ref 3.5–5.1)
Sodium: 140 mmol/L (ref 135–145)

## 2019-10-08 LAB — CBC
HCT: 29 % — ABNORMAL LOW (ref 39.0–52.0)
Hemoglobin: 9.1 g/dL — ABNORMAL LOW (ref 13.0–17.0)
MCH: 30.1 pg (ref 26.0–34.0)
MCHC: 31.4 g/dL (ref 30.0–36.0)
MCV: 96 fL (ref 80.0–100.0)
Platelets: 154 10*3/uL (ref 150–400)
RBC: 3.02 MIL/uL — ABNORMAL LOW (ref 4.22–5.81)
RDW: 14.3 % (ref 11.5–15.5)
WBC: 5.6 10*3/uL (ref 4.0–10.5)
nRBC: 0 % (ref 0.0–0.2)

## 2019-10-08 LAB — APTT
aPTT: >200 seconds (ref 24–36)
aPTT: 75 seconds — ABNORMAL HIGH (ref 24–36)

## 2019-10-08 NOTE — Progress Notes (Signed)
PROGRESS NOTE  Ramel Tobon BCW:[id number] DOB: 1955-12-26 DOA: 09/15/2019 PCP: Maryella Shivers, MD   LOS: 23 days   Brief narrative: As per HPI,  64 year old male was transferred from [facility] 12/23 for acute respiratory failure, rhabdomyolysis, acute kidney injury due to services unavailable at that facility.  He was in the ICU intubated and self extubated on 12/25. Transferred out of ICU 12/27. The patient was taken for right shoulder reverse arthroplasty 1/5. Early AM, 1/8 had vomiting with probable aspiration. Placed on NRB and transferred to ICU for intubation 10/01/2019. He was weaned to extubation on 10/04/2019. He has been evaluated by SLP and has a Cortrak tube in place.  Assessment/Plan:  Principal Problem:   Closed fracture of right proximal humerus Active Problems:   Acute respiratory failure (HCC)   AKI (acute kidney injury) ([place])   Rhabdomyolysis   Acute renal failure (ARF) (HCC)   Pressure injury of skin   Protein-calorie malnutrition, severe   Encounter for feeding tube placement   Aspiration into airway   Fracture  Acute hypoxic respiratory failure from Pseudomonal HCAP.  Extubated on 10/04/2019 and was transferred out of the ICU.  Currently on  IV meropenem day 5.  Was previously on cefepime.  Patient was seen by speech therapy who recommended dysphagia 1 diet on 10/06/19. Again NPO on 114 by speech therapy. High risk of aspiration, repeat evaluation today recommend Dysphagia I.   Continue flutter valve.[name]    Dysphagia and vomiting leading to aspiration pneumonia: Status post modified barium swallow.  Cortrak tube in place and patient has been seen by speech therapy  and recommend dysphagia 1 diet.  Patient had mild hypoxia and had gurgling sound after oral intake yesterday so was NPO, reassessment today recommend the dysphagia 1 diet.     On D5 water. Had repeat CXR on 1/13 without active disease.  Continue aspiration precautions.  Patient does have a chronic  dysphagia and aspiration risk as per speech therapist.  Severe protein calorie malnutrition: Present on admission.  Continue tube feeding.  On dysphagia 1 diet.  Nutrition on board.  On D5 water @ 50 mils per hour.  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 Latest creatinine of 4.9 from 5.6 and improving.   Baseline creatinine around 5.  No plans for HD so far.   Hyperkalemia.  Resolved.    Hypernatremia: Improved.  Continue water flushes.  On D5 water.  Check BMP closely  Anemia of critical illness and chronic disease: We will continue to monitor. Hemoglobin of 9.1 today.  Transfuse if less than 7.  Hyperglycemia: Continue SSI q4hrs. latest POC glucose was 156  Heparin-induced thrombocytopenia. Continue argatroban. transition to eliquiswhen more stable and oral intake is established..  Platelet count of 154 today.   Essential Hypertension: off medications.  Overall stable.  Stable.  Right proximal humerus fracture status post reverse shoulder arthroplasty 1/5:  Orthopedics on board.  VTE Prophylaxis: Argatroban, spoke with pharmacy regarding possibility of switching to oral if possible.  Code Status: Full code  Family Communication: None today. I spoke with the patient's mother Givanni Staron yesterday.  Disposition Plan:   Skilled nursing facility placement as per physical therapy evaluation.  Barriers to discharge include pending clinical improvement including improvement in oral intake, dysphagia with concerns for repeated aspiration, renal failure, pseudomonal pneumonia and ongoing antibiotics.   Consultants:  Orthopedic surgery  Wound Care  Nephrology  PCCM  Procedures:  Intubation x 2  ORIF of right shoulder  CorTrak tube placement  Antibiotics: Meropenem 1/11>  Anti-infectives (From admission, onward)   Start     Dose/Rate Route Frequency Ordered Stop   10/04/19 1230  meropenem (MERREM) 500 mg in sodium chloride 0.9 % 100 mL IVPB     500 mg 200 mL/hr over 30 Minutes  Intravenous Every 24 hours 10/04/19 1213     10/04/19 1000  ceFEPIme (MAXIPIME) 1 g in sodium chloride 0.9 % 100 mL IVPB  Status:  Discontinued     1 g 200 mL/hr over 30 Minutes Intravenous Every 24 hours 10/03/19 0921 10/04/19 1213   10/03/19 0930  ceFEPIme (MAXIPIME) 1 g in sodium chloride 0.9 % 100 mL IVPB     1 g 200 mL/hr over 30 Minutes Intravenous STAT 10/03/19 0921 10/03/19 1100   10/03/19 0800  cefTAZidime (FORTAZ) 0.5 g in dextrose 5 % 50 mL IVPB  Status:  Discontinued     0.5 g 100 mL/hr over 30 Minutes Intravenous Every 24 hours 10/03/19 0705 10/03/19 0921   10/01/19 0800  Ampicillin-Sulbactam (UNASYN) 3 g in sodium chloride 0.9 % 100 mL IVPB  Status:  Discontinued     3 g 200 mL/hr over 30 Minutes Intravenous Every 12 hours 10/01/19 0640 10/03/19 0655   09/28/19 2030  ceFAZolin (ANCEF) IVPB 1 g/50 mL premix     1 g 100 mL/hr over 30 Minutes Intravenous Every 12 hours 09/28/19 1624 09/29/19 1910   09/28/19 1157  vancomycin (VANCOCIN) powder  Status:  Discontinued       As needed 09/28/19 1158 09/28/19 1457   09/28/19 0600  vancomycin (VANCOCIN) IVPB 1000 mg/200 mL premix  Status:  Discontinued     1,000 mg 200 mL/hr over 60 Minutes Intravenous On call to O.R. 09/27/19 1159 09/28/19 1607   09/28/19 0000  ceFAZolin (ANCEF) IVPB 2g/100 mL premix    Note to Pharmacy: Surgery delayed until 1/5.   2 g 200 mL/hr over 30 Minutes Intravenous To Surgery 09/22/19 0904 09/28/19 1222   09/22/19 0815  ceFAZolin (ANCEF) IVPB 2g/100 mL premix  Status:  Discontinued     2 g 200 mL/hr over 30 Minutes Intravenous To Surgery 09/22/19 0806 09/22/19 0905   09/15/19 1715  azithromycin (ZITHROMAX) 500 mg in sodium chloride 0.9 % 250 mL IVPB  Status:  Discontinued     500 mg 250 mL/hr over 60 Minutes Intravenous Every 24 hours 09/15/19 1711 09/18/19 1107   09/15/19 1715  cefTRIAXone (ROCEPHIN) 1 g in sodium chloride 0.9 % 100 mL IVPB     1 g 200 mL/hr over 30 Minutes Intravenous Every 24 hours  09/15/19 1711 09/21/19 1754     Subjective: Today, patient appears to be more alert awake and communicative.  He does not however understand the circumstances of his illness.  Speech therapy at bedside.  Objective: Vitals:   10/08/19 0000 10/08/19 0331  BP: (!) 150/74 (!) 145/72  Pulse: 70 68  Resp: 20 16  Temp: 98.5 F (36.9 C) 98.8 F (37.1 C)  SpO2: 98% 94%    Intake/Output Summary (Last 24 hours) at 10/08/2019 0814 Last data filed at 10/08/2019 0600 Gross per 24 hour  Intake 672.72 ml  Output 1950 ml  Net -1277.28 ml   Filed Weights   10/06/19 0500 10/07/19 0500 10/08/19 0500  Weight: 64.3 kg 67.7 kg 67.8 kg   Body mass index is 22.73 kg/m.   Physical Exam:  GENERAL: Patient is alert awake and mildly communicative. comprehends some queries.  Hard of hearing.  Appears deconditioned  and ill.  Thinly built.  HENT: No scleral pallor or icterus. Pupils equally reactive to light. Oral mucosa is mildly dry.  Cortrak tube in place NECK: is supple, no palpable thyroid enlargement. CHEST: Diminished breath sounds bilaterally. CVS: S1 and S2 heard, no murmur. Regular rate and rhythm. No pericardial rub. ABDOMEN: Soft, non-tender, bowel sounds are present. EXTREMITIES: No edema the left lower extremity.  Right below-knee amputation.  Left shoulder with staples from recent surgery.  Incision site clean dry and intact. CNS: Cranial nerves are intact.  Generalized weakness noted.  Able to comprehend and respond. SKIN: warm and dry without rashes.  Left shoulder incision site clean dry and intact.  Data Review: I have personally reviewed the following laboratory data and studies,  CBC: Recent Labs  Lab 10/04/19 0536 10/05/19 0549 10/06/19 0225 10/07/19 0341 10/08/19 0249  WBC 7.0 4.3 5.9 6.7 5.6  HGB 9.3* 10.8* 10.1* 9.5* 9.1*  HCT 31.1* 36.1* 32.3* 30.7* 29.0*  MCV 99.7 101.4* 98.2 97.5 96.0  PLT 132* 142* 138* 150 876   Basic Metabolic Panel: Recent Labs  Lab  10/03/19 0137 10/03/19 0137 10/04/19 0536 10/05/19 0549 10/06/19 0225 10/07/19 0341 10/08/19 0249  NA 149*   < > 154* 152* 147* 144 140  K 4.5   < > 4.1 3.7 3.8 4.8 4.9  CL 118*   < > 118* 114* 114* 112* 110  CO2 18*   < > 20* '24 22 25 ' 20*  GLUCOSE 91   < > 89 157* 206* 169* 162*  BUN 98*   < > 101* 88* 76* 76* 72*  CREATININE 8.28*   < > 8.04* 7.18* 6.13* 5.66* 4.95*  CALCIUM 7.6*   < > 7.7* 7.6* 7.2* 7.2* 7.4*  MG 2.0  --   --  2.0  --   --  2.0  PHOS 7.1*   < > 6.5* 5.8* 4.0 4.2 4.8*   < > = values in this interval not displayed.   Liver Function Tests: Recent Labs  Lab 10/04/19 0536 10/05/19 0549 10/06/19 0225 10/07/19 0341 10/08/19 0249  ALBUMIN 1.7* 1.8* 1.7* 1.7* 1.6*   No results for input(s): LIPASE, AMYLASE in the last 168 hours. No results for input(s): AMMONIA in the last 168 hours. Cardiac Enzymes: No results for input(s): CKTOTAL, CKMB, CKMBINDEX, TROPONINI in the last 168 hours. BNP (last 3 results) Recent Labs    09/15/19 1822  BNP 61.3    ProBNP (last 3 results) No results for input(s): PROBNP in the last 8760 hours.  CBG: Recent Labs  Lab 10/07/19 1645 10/07/19 1959 10/08/19 0027 10/08/19 0331 10/08/19 0750  GLUCAP 125* 148* 164* 142* 156*   Recent Results (from the past 240 hour(s))  Culture, respiratory (non-expectorated)     Status: None   Collection Time: 10/01/19 12:10 PM   Specimen: Tracheal Aspirate; Respiratory  Result Value Ref Range Status   Specimen Description TRACHEAL ASPIRATE  Final   Special Requests NONE  Final   Gram Stain   Final    FEW WBC PRESENT,BOTH PMN AND MONONUCLEAR RARE GRAM POSITIVE COCCI IN PAIRS Performed at [facility], [place] [address], [place], [place] [address]    Culture   Final    FEW PSEUDOMONAS AERUGINOSA FEW ENTEROBACTER CLOACAE    Report Status 10/04/2019 FINAL  Final   Organism ID, Bacteria PSEUDOMONAS AERUGINOSA  Final   Organism ID, Bacteria ENTEROBACTER CLOACAE  Final       Susceptibility   Enterobacter cloacae -  MIC*    CEFAZOLIN >=64 RESISTANT Resistant     CEFEPIME 16 RESISTANT Resistant     CEFTAZIDIME >=64 RESISTANT Resistant     CIPROFLOXACIN <=0.25 SENSITIVE Sensitive     GENTAMICIN <=1 SENSITIVE Sensitive     IMIPENEM 1 SENSITIVE Sensitive     TRIMETH/SULFA <=20 SENSITIVE Sensitive     PIP/TAZO >=128 RESISTANT Resistant     * FEW ENTEROBACTER CLOACAE   Pseudomonas aeruginosa - MIC*    CEFTAZIDIME >=64 RESISTANT Resistant     CIPROFLOXACIN <=0.25 SENSITIVE Sensitive     GENTAMICIN 2 SENSITIVE Sensitive     IMIPENEM 1 SENSITIVE Sensitive     PIP/TAZO 32 SENSITIVE Sensitive     CEFEPIME 8 SENSITIVE Sensitive     * FEW PSEUDOMONAS AERUGINOSA     Studies: DG Chest Port 1 View  Result Date: 10/06/2019 CLINICAL DATA:  New cough.  Possible aspiration. EXAM: PORTABLE CHEST 1 VIEW COMPARISON:  Chest x-ray dated October 04, 2019. FINDINGS: Interval removal of the endotracheal tube. Feeding tube entering the stomach with the tip below the field of view. The heart size and mediastinal contours are within normal limits. Atherosclerotic calcification of the aortic arch. Normal pulmonary vascularity. Linear atelectasis in the right mid lung. No focal consolidation, pleural effusion, or pneumothorax. No acute osseous abnormality. Prior right reverse total shoulder arthroplasty. IMPRESSION: No active disease. Electronically Signed   By: Titus Dubin M.D.   On: 10/06/2019 19:06   DG Swallowing Func-Speech Pathology  Result Date: 10/06/2019 Objective Swallowing Evaluation: Type of Study: MBS-Modified Barium Swallow Study  Patient Details Name: Ryan Villa MRN: [id number] Date of Birth: September 03, 1956 Today's Date: 10/06/2019 Time: SLP Start Time (ACUTE ONLY): 0935 -SLP Stop Time (ACUTE ONLY): 1000 SLP Time Calculation (min) (ACUTE ONLY): 25 min Past Medical History: No past medical history on file. Past Surgical History: Past Surgical History: Procedure Laterality Date .  REVERSE SHOULDER ARTHROPLASTY Right 09/28/2019  Procedure: RIGHT REVERSE SHOULDER ARTHROPLASTY;  Surgeon: Nicholes Stairs, MD;  Location: [place];  Service: Orthopedics;  Laterality: Right; HPI: 64 year old male transferred from [facility] for acute respiratory failure, rhabdomyolysis (fell and laid on floor 3 days), acute kidney injury. PMH: CKD stage IV, diabetes mellitus type 2, hyperlipidemia, hypertension, left carotid stenosis, intracerebral hemorrhage, status post right BKA. Per chart EMS reported that the patient's sister was bringing him food while he was lying on the floor during that 3-day period. Intubated 12/23-12/24 (self extubated). Found to have right humeral neck fracture and right upper lobe consolidation per chest x-ray, head CT no acute intracranial process,   Pt underwent MBS 12/28 with findings of severe dysphagia - asp of nectar/honey thick.  Repeat MBS 1/2 recommended Dys 1 diet and nectar thick liquids with chin tuck. On 1/8 pt had vomiting and suspected aspiration requiring reintubation; extuabted 1/11.  Subjective: alert, needs cues Assessment / Plan / Recommendation CHL IP CLINICAL IMPRESSIONS 10/06/2019 Clinical Impression Pts function is similiar in nature to prior MBS, but worsened in severeity with further deconditioning. Pt has slow lingual pumping and premature spillage with delayed swallow initiation. There is silent aspiration of moderate quantity with nectar and honey teaspoon. There is typically a very delayed congested cough when aspirate descends into the trachea. He cannot elicit a dry swallow without prolonged lingual pumping which makes a "clear throat and swallow" strategy pointless as he just reaspirates what he cleared while he tries to swallow. He was successful with puree, even with a heaping spoon. A chin tuck is  very beneficial in increased epiglottic deflection, reducing vallecular residue and capturing a 1/2 teaspoon of honey thick prior to the swallow.  HOWEVER, this must be done with 100% accuracy or pt will aspirate. Pt needs max cues for a deep chin tuck and the quantity of honey thick must be 1/2 teaspoon. And given silent aspiration it is difficult to determine success of this method subjectively. For this reason, recommend pt continue with Cortrak, consuming pudding/puree only (no liquids). SLP only can offer liquid trials.  SLP Visit Diagnosis Dysphagia, oropharyngeal phase (R13.12) Attention and concentration deficit following -- Frontal lobe and executive function deficit following -- Impact on safety and function Moderate aspiration risk   CHL IP TREATMENT RECOMMENDATION 10/06/2019 Treatment Recommendations Therapy as outlined in treatment plan below   Prognosis 10/06/2019 Prognosis for Safe Diet Advancement Good Barriers to Reach Goals Cognitive deficits Barriers/Prognosis Comment -- CHL IP DIET RECOMMENDATION 10/06/2019 SLP Diet Recommendations Pudding thick liquid;Dysphagia 1 (Puree) solids Liquid Administration via -- Medication Administration Via alternative means Compensations Chin tuck Postural Changes --   CHL IP OTHER RECOMMENDATIONS 10/04/2019 Recommended Consults -- Oral Care Recommendations -- Other Recommendations Have oral suction available   CHL IP FOLLOW UP RECOMMENDATIONS 10/06/2019 Follow up Recommendations Skilled Nursing facility   [facility] IP FREQUENCY AND DURATION 10/06/2019 Speech Therapy Frequency (ACUTE ONLY) min 2x/week Treatment Duration 2 weeks      CHL IP ORAL PHASE 10/06/2019 Oral Phase Impaired Oral - Pudding Teaspoon -- Oral - Pudding Cup -- Oral - Honey Teaspoon Lingual pumping;Reduced posterior propulsion;Delayed oral transit;Decreased bolus cohesion;Premature spillage Oral - Honey Cup -- Oral - Nectar Teaspoon Lingual pumping;Reduced posterior propulsion;Delayed oral transit;Decreased bolus cohesion;Premature spillage Oral - Nectar Cup -- Oral - Nectar Straw NT Oral - Thin Teaspoon NT Oral - Thin Cup -- Oral - Thin Straw NT Oral -  Puree Lingual pumping;Reduced posterior propulsion;Delayed oral transit;Decreased bolus cohesion;Premature spillage Oral - Mech Soft NT Oral - Regular -- Oral - Multi-Consistency -- Oral - Pill -- Oral Phase - Comment --  CHL IP PHARYNGEAL PHASE 10/06/2019 Pharyngeal Phase Impaired Pharyngeal- Pudding Teaspoon -- Pharyngeal -- Pharyngeal- Pudding Cup -- Pharyngeal -- Pharyngeal- Honey Teaspoon Delayed swallow initiation-pyriform sinuses;Delayed swallow initiation-vallecula;Penetration/Aspiration before swallow;Reduced epiglottic inversion;Pharyngeal residue - valleculae;Moderate aspiration;Compensatory strategies attempted (with notebox) Pharyngeal Material enters airway, passes BELOW cords without attempt by patient to eject out (silent aspiration);Material does not enter airway Pharyngeal- Honey Cup -- Pharyngeal -- Pharyngeal- Nectar Teaspoon Delayed swallow initiation-pyriform sinuses;Penetration/Aspiration before swallow;Reduced epiglottic inversion;Pharyngeal residue - valleculae;Moderate aspiration Pharyngeal Material enters airway, passes BELOW cords without attempt by patient to eject out (silent aspiration) Pharyngeal- Nectar Cup NT Pharyngeal -- Pharyngeal- Nectar Straw NT Pharyngeal -- Pharyngeal- Thin Teaspoon NT Pharyngeal -- Pharyngeal- Thin Cup -- Pharyngeal -- Pharyngeal- Thin Straw NT Pharyngeal -- Pharyngeal- Puree Delayed swallow initiation-vallecula;Pharyngeal residue - valleculae;Reduced epiglottic inversion Pharyngeal Material does not enter airway Pharyngeal- Mechanical Soft NT Pharyngeal -- Pharyngeal- Regular -- Pharyngeal -- Pharyngeal- Multi-consistency -- Pharyngeal -- Pharyngeal- Pill -- Pharyngeal -- Pharyngeal Comment --  No flowsheet data found. Herbie Baltimore, MA CCC-SLP Acute Rehabilitation Services Pager [phone] Office [phone] Lynann Beaver 10/06/2019, 10:19 AM               Scheduled Meds: . chlorhexidine gluconate (MEDLINE KIT)  15 mL Mouth Rinse BID  .  Chlorhexidine Gluconate Cloth  6 each Topical Daily  . darbepoetin (ARANESP) injection - NON-DIALYSIS  150 mcg Subcutaneous Q Thu-1800  . docusate  100 mg Per Tube BID  .  free water  300 mL Per Tube Q4H  . insulin aspart  0-9 Units Subcutaneous Q4H  . mouth rinse  15 mL Mouth Rinse q12n4p  . pantoprazole sodium  40 mg Per Tube Daily  . pneumococcal 23 valent vaccine  0.5 mL Intramuscular Tomorrow-1000  . polyethylene glycol  17 g Per Tube BID    Continuous Infusions: . sodium chloride Stopped (10/05/19 2000)  . argatroban 1.45 mcg/kg/min (10/08/19 0600)  . dextrose 50 mL/hr at 10/08/19 0600  . feeding supplement (OSMOLITE 1.2 CAL) 1,000 mL (10/07/19 2129)  . meropenem (MERREM) IV 500 mg (10/07/19 1221)    Flora Lipps, MD  [facility] 10/08/2019

## 2019-10-08 NOTE — Progress Notes (Signed)
Occupational Therapy Treatment Patient Details Name: Ryan Villa MRN: [id number] DOB: [birth year] Today's Date: 10/08/2019    History of present illness Pt adm with rt humeral fx after fall at home and in the floor x 3 days. Pt intubated 12/23 and self extubated 12/25. Pt with acute metabolic encephalopathy and delirium, PMH -R BKA, ckd, dm, htn, intracerebral hemorrhage. Pt now s/p R TSA 1/5. Pt re-intubated 1/8 due to suspected aspiration PNA.   OT comments  Pt seen for OT follow up, more lethargic this date. He was not actively following commands, or engaging in transfers. Upon entering- noted RUE (previous shoulder sx) in slight extension, abduction, and internal rotation. Hand and forearm are very edematous. Forearm had greater than 3+ pitting edema that was lasting. Incision was not covered, appeared slightly red. RN informed and applied dressing. He is total A +2 for bed mobility, total A to support sitting EOB. Not responding to pain, temperature change, or other noxious stimuli. MD paged about RUE and status change. D/c recs remain appropriate, will continue to follow.   Follow Up Recommendations  SNF;Supervision/Assistance - 24 hour    Equipment Recommendations  None recommended by OT    Recommendations for Other Services      Precautions / Restrictions Precautions Precautions: Fall;Shoulder Type of Shoulder Precautions: okay to WB with transfers, otherwise no lifting; sling on when not ambulating or doing BADLs, okay for pendulums; no abduction; ER: 30 degrees; no AROM; okay hand wrist and elbow AROM Shoulder Interventions: Shoulder sling/immobilizer;For comfort Precaution Comments: R humerus ORIF 1/5 Required Braces or Orthoses: Sling Restrictions Weight Bearing Restrictions: Yes RUE Weight Bearing: Non weight bearing       Mobility Bed Mobility Overal bed mobility: Needs Assistance Bed Mobility: Supine to Sit     Supine to sit: Total assist;+2 for physical  assistance Sit to supine: Total assist;+2 for physical assistance   General bed mobility comments: total A +2 for mobility this date, pt did not initiate at all in transfer  Transfers                      Balance Overall balance assessment: Needs assistance Sitting-balance support: Single extremity supported;No upper extremity supported;Feet supported Sitting balance-Leahy Scale: Zero Sitting balance - Comments: max A to sit up this date                                   ADL either performed or assessed with clinical judgement   ADL Overall ADL's : Needs assistance/impaired                                       General ADL Comments: attempted to initiate pt in BADL tasks, pt not responding to environment or cues.     Vision   Vision Assessment?: No apparent visual deficits   Perception     Praxis      Cognition Arousal/Alertness: Awake/alert Behavior During Therapy: Flat affect Overall Cognitive Status: Impaired/Different from baseline                                 General Comments: pt not following commands or engaging in session at all this date despite multiple cues, ADL initations, positional change, and noxious stimuli  Exercises     Shoulder Instructions       General Comments      Pertinent Vitals/ Pain       Pain Location: not responding to painful stim this date  Home Living                                          Prior Functioning/Environment              Frequency  Min 2X/week        Progress Toward Goals  OT Goals(current goals can now be found in the care plan section)  Progress towards OT goals: Progressing toward goals  Acute Rehab OT Goals Patient Stated Goal: none stated Time For Goal Achievement: 10/19/19 Potential to Achieve Goals: [place] Discharge plan remains appropriate;Frequency remains appropriate    Co-evaluation    PT/OT/SLP  Co-Evaluation/Treatment: Yes Reason for Co-Treatment: Complexity of the patient's impairments (multi-system involvement);Necessary to address cognition/behavior during functional activity;For patient/therapist safety;To address functional/ADL transfers PT goals addressed during session: Mobility/safety with mobility;Balance;Strengthening/ROM OT goals addressed during session: ADL's and self-care;Strengthening/ROM      AM-PAC OT "6 Clicks" Daily Activity     Outcome Measure   Help from another person eating meals?: Total Help from another person taking care of personal grooming?: Total Help from another person toileting, which includes using toliet, bedpan, or urinal?: Total Help from another person bathing (including washing, rinsing, drying)?: Total Help from another person to put on and taking off regular upper body clothing?: Total Help from another person to put on and taking off regular lower body clothing?: Total 6 Click Score: 6    End of Session Equipment Utilized During Treatment: Other (comment)(sling)  OT Visit Diagnosis: Muscle weakness (generalized) (M62.81);Other symptoms and signs involving cognitive function   Activity Tolerance Patient tolerated treatment well   Patient Left in bed;with call bell/phone within reach   Nurse Communication Mobility status;Precautions;Other (comment)(sling management)        Time: [id number] OT Time Calculation (min): 33 min  Charges: OT General Charges $OT Visit: 1 Visit OT Treatments $Self Care/Home Management : 8-22 mins  Zenovia Jarred, MSOT, OTR/L Acute Rehabilitation Services [facility] Office Number: [phone]  Zenovia Jarred 10/08/2019, 5:39 PM

## 2019-10-08 NOTE — Progress Notes (Signed)
[place] for Argatroban  Indication: HIT   Allergies  Allergen Reactions  . 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    Estimated Creatinine Clearance: 14.6 mL/min (A) (by C-G formula based on SCr of 4.95 mg/dL (H)).   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 Heparin was stopped, and SRA was positive on 09/22/19.  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 The guidelines are unclear on the intended length of therapy with the patient not having a confirmed thrombosis, but could consider continuing anticoagulation for 4 weeks. The patient could eventually transition to oral anticoagulation.   APTT early  this morning was >200 sec (repeat level was 75 sec indicating that initial level was in error). aPTT remains therapeutic at 75 sec. No bleeding reported; he is on Aranesp for anemia - Hg 9.1. Platelet count is fluctuating, 154  today.  Goal of Therapy:  aPTT 50-90 seconds Monitor platelets by anticoagulation protocol: Yes    Plan:  Continue argatroban at 1.45 mc/kg/min Monitor daily aPTT and CBC, s/sx bleeding F/U transition to Eliquis pending SLP eval  Kahleah Crass A. Levada Dy, PharmD, BCPS, FNKF Clinical Pharmacist [facility] Please utilize Amion for appropriate phone number to reach the unit pharmacist ([place])

## 2019-10-08 NOTE — Progress Notes (Signed)
[facility] Progress Note    Assessment/Plan: **CKD 5:  Baseline Cr ~5 -  Recurrent AKI secondary to acute hypoxic event due to aspiration PNA 1/8, cr up to 8.28.   But now  trending back and is at his baseline again.  Dr. Johnney Ou has contacted pts mother, she consents to him getting dialysis if needed-  I have talked with the brother as well.  Unfortunately I feel he will not be a good HD patient- will likely be non compliant unless in a facility. numbers cont to improve. He is at his baseline renal function   **Acute hypoxic resp failure: secondary to aspiration, intubation; per PCCM.  Now extubated- failing swallowing studies repeatedly..... unsure if he will be able to eat/drink safely  **Hyperkalemia: resolved-   **Hypernatremia:  On free water - total of 1800 per 24 hours -   D5 at 75 per hour.  Sodium better -  in the normal range... - will stop IVF but cont free water per tube  **Anemia: improved following transfusion 1/7.  Iron replete 1 week ago. On ESA, dose increased on 1/7.  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 As above would not be a good dialysis candidate due to poor insight.  Likely will not be able to avoid aspiration again due to same.  Not sure what the end point is.   Call with questions    _______________________________________________________________________   Subjective:   1900 of UOP-  BUN , crt and sodium all down, failed swallowing-    Objective Vitals:   10/08/19 0000 10/08/19 0331 10/08/19 0500 10/08/19 0820  BP: (!) 150/74 (!) 145/72  (!) 149/68  Pulse: 70 68  65  Resp: 20 16  (!) 22  Temp: 98.5 F (36.9 C) 98.8 F (37.1 C)  97.8 F (36.6 C)  TempSrc: Oral Oral  Oral  SpO2: 98% 94%  96%  Weight:   67.8 kg   Height:       Physical Exam General: slow MS -  Trouble with voice but seems to understand what I am saying  to some extent  Heart: no rub Lungs: vent, clear ant and laterally Abdomen: soft, thin Extremities: no edema Neuro: nonfocal. No asterixis  Additional Objective Labs: Basic Metabolic Panel: Recent Labs  Lab 10/06/19 0225 10/07/19 0341 10/08/19 0249  NA 147* 144 140  K 3.8 4.8 4.9  CL 114* 112* 110  CO2 22 25 20*  GLUCOSE 206* 169* 162*  BUN 76* 76* 72*  CREATININE 6.13* 5.66* 4.95*  CALCIUM 7.2* 7.2* 7.4*  PHOS 4.0 4.2 4.8*   Liver Function Tests: Recent Labs  Lab 10/06/19 0225 10/07/19 0341 10/08/19 0249  ALBUMIN 1.7* 1.7* 1.6*   No results for input(s): LIPASE, AMYLASE in the last 168 hours. CBC: Recent Labs  Lab 10/04/19 0536 10/04/19 0536 10/05/19 0549 10/05/19 0549 10/06/19 0225 10/07/19 0341 10/08/19 0249  WBC 7.0   < > 4.3   < > 5.9 6.7 5.6  HGB 9.3*   < > 10.8*   < > 10.1* 9.5* 9.1*  HCT 31.1*   < > 36.1*   < > 32.3* 30.7* 29.0*  MCV 99.7  --  101.4*  --  98.2 97.5 96.0  PLT 132*   < > 142*   < > 138* 150 154   < > = values in this interval not  displayed.   Blood Culture    Component Value Date/Time   SDES TRACHEAL ASPIRATE 10/01/2019 1210   SPECREQUEST NONE 10/01/2019 1210   CULT  10/01/2019 1210    FEW PSEUDOMONAS AERUGINOSA FEW ENTEROBACTER CLOACAE    REPTSTATUS 10/04/2019 FINAL 10/01/2019 1210    Cardiac Enzymes: No results for input(s): CKTOTAL, CKMB, CKMBINDEX, TROPONINI in the last 168 hours. CBG: Recent Labs  Lab 10/07/19 1645 10/07/19 1959 10/08/19 0027 10/08/19 0331 10/08/19 0750  GLUCAP 125* 148* 164* 142* 156*   Iron Studies: No results for input(s): IRON, TIBC, TRANSFERRIN, FERRITIN in the last 72 hours. '@lablastinr3' @ Studies/Results: DG Chest Port 1 View  Result Date: 10/06/2019 CLINICAL DATA:  New cough.  Possible aspiration. EXAM: PORTABLE CHEST 1 VIEW COMPARISON:  Chest x-ray dated October 04, 2019. FINDINGS: Interval removal of the endotracheal tube. Feeding tube entering the stomach with the tip below the field  of view. The heart size and mediastinal contours are within normal limits. Atherosclerotic calcification of the aortic arch. Normal pulmonary vascularity. Linear atelectasis in the right mid lung. No focal consolidation, pleural effusion, or pneumothorax. No acute osseous abnormality. Prior right reverse total shoulder arthroplasty. IMPRESSION: No active disease. Electronically Signed   By: Titus Dubin M.D.   On: 10/06/2019 19:06   Medications: . sodium chloride Stopped (10/05/19 2000)  . argatroban 1.45 mcg/kg/min (10/08/19 0600)  . dextrose 50 mL/hr at 10/08/19 1011  . feeding supplement (OSMOLITE 1.2 CAL) 1,000 mL (10/07/19 2129)  . meropenem (MERREM) IV 500 mg (10/07/19 1221)   . chlorhexidine gluconate (MEDLINE KIT)  15 mL Mouth Rinse BID  . Chlorhexidine Gluconate Cloth  6 each Topical Daily  . darbepoetin (ARANESP) injection - NON-DIALYSIS  150 mcg Subcutaneous Q Thu-1800  . docusate  100 mg Per Tube BID  . free water  300 mL Per Tube Q4H  . insulin aspart  0-9 Units Subcutaneous Q4H  . mouth rinse  15 mL Mouth Rinse q12n4p  . pantoprazole sodium  40 mg Per Tube Daily  . pneumococcal 23 valent vaccine  0.5 mL Intramuscular Tomorrow-1000  . polyethylene glycol  17 g Per Tube BID        [facility] Progress Note    Assessment/Plan: **CKD 5:  Baseline Cr ~5 -  Recurrent AKI secondary to acute hypoxic event 1/8, cr up to 8.28.   But now seems to be trending back toward his baseline again.  Dr. Johnney Ou has contacted pts mother, she consents to him getting dialysis if needed. Now I have talked with the brother as well.  Unfortunately I feel he will not be a good HD patient- will likely be non compliant unless in a facility.  Thankfully no indications today - numbers cont to improve  **Acute hypoxic resp failure: secondary to aspiration, intubation; per PCCM.  Now extubated- failing swallowing study  **Hyperkalemia: resolved-   **Hypernatremia:  On free water -  total of 1800 per 24 hours -   D5 at 75 per hour.  Sodium better - just in the normal range... cont both likely another 24 hours  **Anemia: improved following transfusion 1/7.  Iron replete 1 week ago. On ESA, dose increased on 1/7.  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   _______________________________________________________________________   Subjective:   Has at least 800  of UOP-  BUN , crt and sodium all down, failed swallowing- he now says his thirst is better-  Aspirates with anything so nurse has  made him NPO again   Objective Vitals:   10/08/19 0000 10/08/19 0331 10/08/19 0500 10/08/19 0820  BP: (!) 150/74 (!) 145/72  (!) 149/68  Pulse: 70 68  65  Resp: 20 16  (!) 22  Temp: 98.5 F (36.9 C) 98.8 F (37.1 C)  97.8 F (36.6 C)  TempSrc: Oral Oral  Oral  SpO2: 98% 94%  96%  Weight:   67.8 kg   Height:       Physical Exam General: slow MS -  Trouble with voice but seems to understand what I am saying to some extent - wants to drink.  I told him it goes into his lungs "no it doesn't"  Heart: no rub Lungs: vent, clear ant and laterally Abdomen: soft, thin Extremities: no edema Neuro: nonfocal. No asterixis  Additional Objective Labs: Basic Metabolic Panel: Recent Labs  Lab 10/06/19 0225 10/07/19 0341 10/08/19 0249  NA 147* 144 140  K 3.8 4.8 4.9  CL 114* 112* 110  CO2 22 25 20*  GLUCOSE 206* 169* 162*  BUN 76* 76* 72*  CREATININE 6.13* 5.66* 4.95*  CALCIUM 7.2* 7.2* 7.4*  PHOS 4.0 4.2 4.8*   Liver Function Tests: Recent Labs  Lab 10/06/19 0225 10/07/19 0341 10/08/19 0249  ALBUMIN 1.7* 1.7* 1.6*   No results for input(s): LIPASE, AMYLASE in the last 168 hours. CBC: Recent Labs  Lab 10/04/19 0536 10/04/19 0536 10/05/19 0549 10/05/19 0549 10/06/19 0225 10/07/19 0341 10/08/19 0249  WBC 7.0   < > 4.3   < > 5.9 6.7 5.6  HGB 9.3*   < > 10.8*   < > 10.1* 9.5* 9.1*  HCT 31.1*   < > 36.1*   < > 32.3* 30.7*  29.0*  MCV 99.7  --  101.4*  --  98.2 97.5 96.0  PLT 132*   < > 142*   < > 138* 150 154   < > = values in this interval not displayed.   Blood Culture    Component Value Date/Time   SDES TRACHEAL ASPIRATE 10/01/2019 1210   SPECREQUEST NONE 10/01/2019 1210   CULT  10/01/2019 1210    FEW PSEUDOMONAS AERUGINOSA FEW ENTEROBACTER CLOACAE    REPTSTATUS 10/04/2019 FINAL 10/01/2019 1210    Cardiac Enzymes: No results for input(s): CKTOTAL, CKMB, CKMBINDEX, TROPONINI in the last 168 hours. CBG: Recent Labs  Lab 10/07/19 1645 10/07/19 1959 10/08/19 0027 10/08/19 0331 10/08/19 0750  GLUCAP 125* 148* 164* 142* 156*   Iron Studies: No results for input(s): IRON, TIBC, TRANSFERRIN, FERRITIN in the last 72 hours. '@lablastinr3' @ Studies/Results: DG Chest Port 1 View  Result Date: 10/06/2019 CLINICAL DATA:  New cough.  Possible aspiration. EXAM: PORTABLE CHEST 1 VIEW COMPARISON:  Chest x-ray dated October 04, 2019. FINDINGS: Interval removal of the endotracheal tube. Feeding tube entering the stomach with the tip below the field of view. The heart size and mediastinal contours are within normal limits. Atherosclerotic calcification of the aortic arch. Normal pulmonary vascularity. Linear atelectasis in the right mid lung. No focal consolidation, pleural effusion, or pneumothorax. No acute osseous abnormality. Prior right reverse total shoulder arthroplasty. IMPRESSION: No active disease. Electronically Signed   By: Titus Dubin M.D.   On: 10/06/2019 19:06   Medications: . sodium chloride Stopped (10/05/19 2000)  . argatroban 1.45 mcg/kg/min (10/08/19 0600)  . dextrose 50 mL/hr at 10/08/19 1011  . feeding supplement (OSMOLITE 1.2 CAL) 1,000 mL (10/07/19 2129)  . meropenem (MERREM) IV 500 mg (10/07/19 1221)   .  chlorhexidine gluconate (MEDLINE KIT)  15 mL Mouth Rinse BID  . Chlorhexidine Gluconate Cloth  6 each Topical Daily  . darbepoetin (ARANESP) injection - NON-DIALYSIS  150 mcg  Subcutaneous Q Thu-1800  . docusate  100 mg Per Tube BID  . free water  300 mL Per Tube Q4H  . insulin aspart  0-9 Units Subcutaneous Q4H  . mouth rinse  15 mL Mouth Rinse q12n4p  . pantoprazole sodium  40 mg Per Tube Daily  . pneumococcal 23 valent vaccine  0.5 mL Intramuscular Tomorrow-1000  . polyethylene glycol  17 g Per Tube BID

## 2019-10-08 NOTE — Progress Notes (Signed)
Paged X Blout because PTT today came back as >200 and had been 64, 69, and 71 previously on Argatroban drip with no changes in dose and no signs of bleeding.  Vitals stable and no signs of distress.  Did ask if wants a redraw, as I did ask if lab tech could have drawn from same arm above site but she was not sure and he had multiple bandaides on so it was hard to tell where she got it from.  Waiting on a response.  He is resting with no complaints and no signs of distress.

## 2019-10-08 NOTE — Progress Notes (Signed)
  Speech Language Pathology Treatment: Dysphagia  Patient Details Name: Ryan Villa MRN: [id number] DOB: [birth year] Today's Date: 10/08/2019 Time: 0900-0930 SLP Time Calculation (min) (ACUTE ONLY): 30 min  Assessment / Plan / Recommendation Clinical Impression  Pt seen for therapeutic trials of pudding and puree and training in compensatory strategy. Pt repositioned, cued to clear his throat and swallow (resulting in delayed coughing). Then given full container of strawberry yogurt with 100% cueing each bite to fully complete and sustain chin tuck. Though he needed a tactile or verbal cue to tuck chin for each swallow, He was progressively more successful in making his chin tuck adequate deep and sustaining the chin down until swallow complete by the end of the session. No coughing occurred during session after secretions cleared initially. Aspiration of secretions is likely going to be an ongoing problem regardless of diet restrictions. Discussed with RN, posted instructions and changed diet to NPO except for pudding/puree from floor stock. Staff will need to be sure to provide PO or pt will be unlikely to improve at all.   MD present during session and we discussed probability of minimal improvement over the next few days and the consideration of long term means of supplemental nutrition vs a change in code status and plan of care. Pt has a severe and chronic dysphagia and will have persistent aspiration risk. His cognition is impaired and he desires PO. Will follow for needs, but current situation is not sustainable, pt will need access to liquids at some point.    HPI HPI: 64 year old male transferred from [facility] for acute respiratory failure, rhabdomyolysis (fell and laid on floor 3 days), acute kidney injury. PMH: CKD stage IV, diabetes mellitus type 2, hyperlipidemia, hypertension, left carotid stenosis, intracerebral hemorrhage, status post right BKA. Per chart EMS reported that the  patient's sister was bringing him food while he was lying on the floor during that 3-day period. Intubated 12/23-12/24 (self extubated). Found to have right humeral neck fracture and right upper lobe consolidation per chest x-ray, head CT no acute intracranial process,   Pt underwent MBS 12/28 with findings of severe dysphagia - asp of nectar/honey thick.  Repeat MBS 1/2 recommended Dys 1 diet and nectar thick liquids with chin tuck. On 1/8 pt had vomiting and suspected aspiration requiring reintubation; extuabted 1/11.      SLP Plan  Continue with current plan of care       Recommendations  Diet recommendations: Dysphagia 1 (puree) Medication Administration: Via alternative means                Oral Care Recommendations: Oral care QID Follow up Recommendations: Skilled Nursing facility SLP Visit Diagnosis: Dysphagia, oropharyngeal phase (R13.12) Plan: Continue with current plan of care       GO               Herbie [place], MA [facility] Pager [phone] Office [phone]  Lynann Beaver 10/08/2019, 10:28 AM

## 2019-10-08 NOTE — Progress Notes (Signed)
Physical Therapy Treatment Patient Details Name: Ryan Villa MRN: [id number] DOB: April 21, 1956 Today's Date: 10/08/2019    History of Present Illness Pt adm with rt humeral fx after fall at home and in the floor x 3 days. Pt intubated 12/23 and self extubated 12/25. Pt with acute metabolic encephalopathy and delirium, PMH -R BKA, ckd, dm, htn, intracerebral hemorrhage. Pt now s/p R TSA 1/5. Pt re-intubated 1/8 due to suspected aspiration PNA.    PT Comments    Pt with regression towards physical therapy goals. Pt lethargic and unable to follow any simple commands this date. Also displays significant RUE/hand edema and flaccidity. RN/MD notified. Session focused on bed mobility and positioning. Pt requiring two person total assist for all aspects of bed mobility. Bed placed in chair position to promote upright; right upper extremity positioned in sling with elevated positioning.  Will continue efforts.   Follow Up Recommendations  SNF     Equipment Recommendations  None recommended by PT    Recommendations for Other Services       Precautions / Restrictions Precautions Precautions: Fall;Shoulder;Other (comment) Type of Shoulder Precautions: okay to WB with transfers, otherwise no lifting; sling on when not ambulating or doing BADLs, okay for pendulums; no abduction; ER: 30 degrees; no AROM; okay hand wrist and elbow AROM Shoulder Interventions: Shoulder sling/immobilizer;For comfort Precaution Comments: R humerus ORIF 1/5, RUE flaccid on 1/15 Required Braces or Orthoses: Sling Restrictions Weight Bearing Restrictions: Yes RUE Weight Bearing: Non weight bearing    Mobility  Bed Mobility Overal bed mobility: Needs Assistance Bed Mobility: Supine to Sit     Supine to sit: Total assist;+2 for physical assistance Sit to supine: Total assist;+2 for physical assistance   General bed mobility comments: total A +2 for mobility this date, pt did not initiate at all in  transfer  Transfers                    Ambulation/Gait                 Stairs             Wheelchair Mobility    Modified Rankin (Stroke Patients Only)       Balance Overall balance assessment: Needs assistance Sitting-balance support: Single extremity supported;No upper extremity supported;Feet supported Sitting balance-Leahy Scale: Zero Sitting balance - Comments: max A to sit up this date                                    Cognition Arousal/Alertness: Awake/alert Behavior During Therapy: Flat affect Overall Cognitive Status: Impaired/Different from baseline                                 General Comments: pt not following commands or engaging in session at all this date despite multiple cues, ADL initations, positional change, and noxious stimuli      Exercises      General Comments        Pertinent Vitals/Pain Pain Assessment: Faces Faces Pain Scale: No hurt Pain Location: not responding to painful stim this date    Home Living                      Prior Function            PT Goals (current goals can now  be found in the care plan section) Acute Rehab PT Goals Patient Stated Goal: none stated Potential to Achieve Goals: Fair Progress towards PT goals: Not progressing toward goals - comment(not following commands 1/15)    Frequency    Min 2X/week      PT Plan Current plan remains appropriate    Co-evaluation PT/OT/SLP Co-Evaluation/Treatment: Yes Reason for Co-Treatment: Complexity of the patient's impairments (multi-system involvement);Necessary to address cognition/behavior during functional activity;For patient/therapist safety;To address functional/ADL transfers PT goals addressed during session: Mobility/safety with mobility;Balance;Strengthening/ROM OT goals addressed during session: ADL's and self-care;Strengthening/ROM      AM-PAC PT "6 Clicks" Mobility   Outcome  Measure  Help needed turning from your back to your side while in a flat bed without using bedrails?: Total Help needed moving from lying on your back to sitting on the side of a flat bed without using bedrails?: Total Help needed moving to and from a bed to a chair (including a wheelchair)?: Total Help needed standing up from a chair using your arms (e.g., wheelchair or bedside chair)?: Total Help needed to walk in hospital room?: Total Help needed climbing 3-5 steps with a railing? : Total 6 Click Score: 6    End of Session   Activity Tolerance: Patient limited by lethargy Patient left: in bed;with call bell/phone within reach;with bed alarm set Nurse Communication: Mobility status PT Visit Diagnosis: Other abnormalities of gait and mobility (R26.89);Muscle weakness (generalized) (M62.81);Pain Pain - Right/Left: Right Pain - part of body: Shoulder     Time: [id number] PT Time Calculation (min) (ACUTE ONLY): 33 min  Charges:  $Therapeutic Activity: 8-22 mins                     Ellamae Sia, PT, DPT Acute Rehabilitation Services Pager [phone] Office [phone]    Willy Eddy 10/08/2019, 5:40 PM

## 2019-10-08 NOTE — Plan of Care (Signed)
  Problem: Activity: Goal: Risk for activity intolerance will decrease 10/08/2019 0702 by Asencion Partridge, RN Outcome: Progressing 10/08/2019 0702 by Asencion Partridge, RN Outcome: Progressing   Problem: Nutrition: Goal: Adequate nutrition will be maintained 10/08/2019 0702 by Asencion Partridge, RN Outcome: Progressing 10/08/2019 0702 by Asencion Partridge, RN Outcome: Progressing

## 2019-10-08 NOTE — Progress Notes (Signed)
Pharmacy Antibiotic Note  Ryan Villa is a 64 y.o. male admitted on 09/15/2019 with pneumonia.  Pharmacy has been consulted for Meropenem dosing.  ID:  PNA (vomited 1/8, poss. aspiration, intubated),  Afebrile, WBC WNL. Escalate abx for HCAP with Pseudomonas  Azith 12/23>>12/26 CTX 12/23>> 12/29 Unasyn 1/7 >>1/10 Cefepime 1/10>>1/11 Meropenem 1/11>>  12/23 BCx - negative 12/23 UCx - 80k E.coli (sens CTX) 12/23 MRSA PCR - positive 12/28 covid - negative 1/8 TA - few PSA (R-ceftaz) and Enterobacter (R-cefepime, S-Cipro, merrem)  Plan: Continue Meropenem 500mg  IV q24h Clarify LOT, deescalation   Height: 5\' 8"  (172.7 cm) Weight: 149 lb 7.6 oz (67.8 kg) IBW/kg (Calculated) : 68.4  Temp (24hrs), Avg:98.3 F (36.8 C), Min:97.4 F (36.3 C), Max:98.8 F (37.1 C)  Recent Labs  Lab 10/04/19 0536 10/05/19 0549 10/06/19 0225 10/07/19 0341 10/08/19 0249  WBC 7.0 4.3 5.9 6.7 5.6  CREATININE 8.04* 7.18* 6.13* 5.66* 4.95*    Estimated Creatinine Clearance: 14.6 mL/min (A) (by C-G formula based on SCr of 4.95 mg/dL (H)).    Allergies  Allergen Reactions  . 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)     Carletha Dawn A. Levada Dy, PharmD, BCPS, FNKF Clinical Pharmacist [facility] Please utilize Amion for appropriate phone number to reach the unit pharmacist ([place])

## 2019-10-09 LAB — GLUCOSE, CAPILLARY
Glucose-Capillary: 176 mg/dL — ABNORMAL HIGH (ref 70–99)
Glucose-Capillary: 144 mg/dL — ABNORMAL HIGH (ref 70–99)
Glucose-Capillary: 151 mg/dL — ABNORMAL HIGH (ref 70–99)
Glucose-Capillary: 122 mg/dL — ABNORMAL HIGH (ref 70–99)
Glucose-Capillary: 119 mg/dL — ABNORMAL HIGH (ref 70–99)
Glucose-Capillary: 108 mg/dL — ABNORMAL HIGH (ref 70–99)
Glucose-Capillary: 106 mg/dL — ABNORMAL HIGH (ref 70–99)

## 2019-10-09 LAB — RENAL FUNCTION PANEL
Albumin: 1.7 g/dL — ABNORMAL LOW (ref 3.5–5.0)
Anion gap: 9 (ref 5–15)
BUN: 69 mg/dL — ABNORMAL HIGH (ref 8–23)
CO2: 21 mmol/L — ABNORMAL LOW (ref 22–32)
Calcium: 7.6 mg/dL — ABNORMAL LOW (ref 8.9–10.3)
Chloride: 108 mmol/L (ref 98–111)
Creatinine, Ser: 4.71 mg/dL — ABNORMAL HIGH (ref 0.61–1.24)
GFR calc Af Amer: 14 mL/min — ABNORMAL LOW (ref >60)
GFR calc non Af Amer: 12 mL/min — ABNORMAL LOW (ref >60)
Glucose, Bld: 145 mg/dL — ABNORMAL HIGH (ref 70–99)
Phosphorus: 5.2 mg/dL — ABNORMAL HIGH (ref 2.5–4.6)
Potassium: 5.6 mmol/L — ABNORMAL HIGH (ref 3.5–5.1)
Sodium: 138 mmol/L (ref 135–145)

## 2019-10-09 LAB — APTT: aPTT: 72 seconds — ABNORMAL HIGH (ref 24–36)

## 2019-10-09 LAB — CBC
HCT: 30.8 % — ABNORMAL LOW (ref 39.0–52.0)
Hemoglobin: 9.7 g/dL — ABNORMAL LOW (ref 13.0–17.0)
MCH: 30.2 pg (ref 26.0–34.0)
MCHC: 31.5 g/dL (ref 30.0–36.0)
MCV: 96 fL (ref 80.0–100.0)
Platelets: 184 10*3/uL (ref 150–400)
RBC: 3.21 MIL/uL — ABNORMAL LOW (ref 4.22–5.81)
RDW: 14.4 % (ref 11.5–15.5)
WBC: 6.4 10*3/uL (ref 4.0–10.5)
nRBC: 0 % (ref 0.0–0.2)

## 2019-10-09 MED ORDER — PRO-STAT SUGAR FREE PO LIQD
30.0000 mL | Freq: Every day | ORAL | Status: DC
  Administered 2019-10-09 – 2019-10-23 (×15): 30 mL
  Filled 2019-10-09 (×16): qty 30

## 2019-10-09 MED ORDER — APIXABAN 5 MG PO TABS
5.0000 mg | ORAL_TABLET | Freq: Two times a day (BID) | ORAL | Status: DC
  Administered 2019-10-09 – 2019-10-20 (×23): 5 mg
  Filled 2019-10-09 (×23): qty 1

## 2019-10-09 MED ORDER — NEPRO/CARBSTEADY PO LIQD
1000.0000 mL | ORAL | Status: DC
  Administered 2019-10-09 – 2019-10-22 (×12): 1000 mL
  Filled 2019-10-09 (×20): qty 1000

## 2019-10-09 NOTE — Progress Notes (Signed)
Nutrition Follow-up  DOCUMENTATION CODES:   Severe malnutrition in context of acute illness/injury  INTERVENTION:   TF via Cortrak: -Switch to Nepro @ 50 ml/hr w/ 30 ml Prostat daily -This will provide 2260 kcals, 112g protein and 872 ml H2O  Free water per MD: currently 300 ml every 4 hours (1800 ml).  NUTRITION DIAGNOSIS:   Severe Malnutrition related to acute illness(S/P fall at home with rhabdomyolysis after lying on the floor for 3 days) as evidenced by moderate fat depletion, moderate muscle depletion, severe muscle depletion, severe fat depletion.  Ongoing.  GOAL:   Patient will meet greater than or equal to 90% of their needs  Meeting with TF  MONITOR:   PO intake, Labs, Weight trends, TF tolerance, I & O's  REASON FOR ASSESSMENT:   Consult(verbal for consideration of TF formula change given elevated K levels) Enteral/tube feeding initiation and management  ASSESSMENT:   64 yo male admitted with respiratory failure, rhabdomyolysis, and AKI after fall at home 3 days PTA. He had been lying on the floor x 3 days since his fall. Found to have PNA, UTI, and right proximal humerus fracture. PMH includes CKD-IV, HLD, DM-2, HTN, L carotid stenosis, ICH, R BKA.  12/25- extubated  12/28- failed MBS, gastric Cortrak placed 1/5- s/p R reverse shoulder arthroplasty  1/2- Cortrak removed, diet advanced DYS1/nectar thick liquids 1/8 vomited x 4 and aspirated, pt tx to ICU and intubated 1/11 extubated 1/12 Cortrak placed, SLP planning MBS  **RD working remotely**  Patient currently receiving Osmolite 1.2 @ 70 ml/hr.  RD received page regarding concern for elevated K levels today. Phos is also elevated as well.  Per nephrology note, pt is not a candidate for dialysis and was at his baseline renal function yesterday.  Per SLP note 1/15: recommends pt remain NPO except for purees/pudding from floor. Pt requires verbal cues for every swallow but will likely always aspirate  secretions.   Will trial Nepro formula via Cortrak tube to see if this improves his lab values.   Admission weight; 151 lbs. Current weight: 154 lbs.  Medications reviewed.   Labs reviewed: Elevated K (5.6) Elevated Phos (5.2) CBGs: 122-151  Diet Order:   Diet Order            Diet NPO time specified Except for: Other (See Comments)  Diet effective now              EDUCATION NEEDS:   Not appropriate for education at this time  Skin:  Skin Assessment: Skin Integrity Issues: Skin Integrity Issues:: Other (Comment) Stage II: N/A Other: skin tear x 2 to R forearm  Last BM:  1/16 -type 6  Height:   Ht Readings from Last 1 Encounters:  10/01/19 5\' 8"  (1.727 m)    Weight:   Wt Readings from Last 1 Encounters:  10/09/19 70.1 kg    Ideal Body Weight:  65.5 kg(adjusted for BKA)  BMI:  Body mass index is 23.5 kg/m.  Estimated Nutritional Needs:   Kcal:  2000-2200  Protein:  100-125 grams  Fluid:  >2 L/day  Clayton Bibles, MS, RD, LDN Inpatient Clinical Dietitian Pager: [phone] After Hours Pager: [phone]

## 2019-10-09 NOTE — Progress Notes (Addendum)
PROGRESS NOTE  Ryan Villa ZOX:[id number] DOB: 1956-03-20 DOA: 09/15/2019 PCP: Maryella Shivers, MD   LOS: 24 days   Brief narrative: As per HPI,  64 year old male was transferred from [facility] 12/23 for acute respiratory failure, rhabdomyolysis, acute kidney injury due to services unavailable at that facility.  He was in the ICU intubated and self extubated on 12/25. Transferred out of ICU 12/27. The patient was taken for right shoulder reverse arthroplasty 1/5. Early AM, 1/8 had vomiting with probable aspiration. Placed on NRB and transferred to ICU for intubation 10/01/2019. He was weaned to extubation on 10/04/2019. He has been evaluated by SLP and has a Cortrak tube in place.  Assessment/Plan:  Principal Problem:   Closed fracture of right proximal humerus Active Problems:   Acute respiratory failure (HCC)   AKI (acute kidney injury) ([place])   Rhabdomyolysis   Acute renal failure (ARF) (HCC)   Pressure injury of skin   Protein-calorie malnutrition, severe   Encounter for feeding tube placement   Aspiration into airway   Fracture  Acute hypoxic respiratory failure from Pseudomonal HCAP.  Extubated on 10/04/2019 and was transferred out of the ICU.  Currently on  IV meropenem -will aim 7 day course.  Was previously on cefepime. Speech therapy on board and is on Dysphagia I diet, puree.   Continue flutter valve.  Currently saturating 96% on room air.   Dysphagia and vomiting leading to aspiration pneumonia: Status post modified barium swallow.  Cortrak tube in place and patient has been seen by speech therapy at intervals  and recommend puree with nursing supervision.     Had repeat CXR on 1/13 without active disease.  Continue aspiration precautions.  Patient does have a chronic dysphagia and aspiration risk as per speech therapist.  Severe protein calorie malnutrition: Present on admission.  Continue tube feeding.  On dysphagia 1 diet.  Nutrition on board.    CKD 5:  Nephrology on board.  Creatinine of 4.7 today.  Baseline creatinine around 5.  No plans for HD so far.  Mild hyperkalemia today.  Hyperkalemia.  Mild.  Will need to closely monitor.  Might need to change  Hypernatremia: Improved.  Continue water flushes.  Check BMP closely.  Sodium of 138 today  Anemia of critical illness and chronic disease: We will continue to monitor. Hemoglobin of 9.7. Transfuse if less than 7.  Hyperglycemia: Continue SSI q4hrs. 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 Plan for transition to eliquis today.  Platelet count of 184 today.   Essential Hypertension: off medications.  Overall stable.  Stable.  Right proximal humerus fracture status post reverse shoulder arthroplasty 1/5:  Orthopedics on board.  VTE Prophylaxis: Argatroban, switch to eliquis  Code Status: Full code  Family Communication:  I tried to reach the patient's mother on the phone but was unable to reach.   Disposition Plan:  Skilled nursing facility placement as per physical therapy evaluation. Pending improvement in oral intake but patient will likely have chronic dysphagia with concerns for repeated aspiration, complete IV antibiotics. Long term feeding might be an issue.  Consultants:  Orthopedic surgery  Wound Care  Nephrology  PCCM  Procedures:  Intubation x 2  ORIF of right shoulder  CorTrak tube placement  Antibiotics: Meropenem 1/11>  Anti-infectives (From admission, onward)   Start     Dose/Rate Route Frequency Ordered Stop   10/04/19 1230  meropenem (MERREM) 500 mg in sodium chloride 0.9 % 100 mL IVPB  500 mg 200 mL/hr over 30 Minutes Intravenous Every 24 hours 10/04/19 1213 10/11/19 1229   10/04/19 1000  ceFEPIme (MAXIPIME) 1 g in sodium chloride 0.9 % 100 mL IVPB  Status:  Discontinued     1 g 200 mL/hr over 30 Minutes Intravenous Every 24 hours 10/03/19 0921 10/04/19 1213   10/03/19 0930  ceFEPIme (MAXIPIME) 1 g in  sodium chloride 0.9 % 100 mL IVPB     1 g 200 mL/hr over 30 Minutes Intravenous STAT 10/03/19 0921 10/03/19 1100   10/03/19 0800  cefTAZidime (FORTAZ) 0.5 g in dextrose 5 % 50 mL IVPB  Status:  Discontinued     0.5 g 100 mL/hr over 30 Minutes Intravenous Every 24 hours 10/03/19 0705 10/03/19 0921   10/01/19 0800  Ampicillin-Sulbactam (UNASYN) 3 g in sodium chloride 0.9 % 100 mL IVPB  Status:  Discontinued     3 g 200 mL/hr over 30 Minutes Intravenous Every 12 hours 10/01/19 0640 10/03/19 0655   09/28/19 2030  ceFAZolin (ANCEF) IVPB 1 g/50 mL premix     1 g 100 mL/hr over 30 Minutes Intravenous Every 12 hours 09/28/19 1624 09/29/19 1910   09/28/19 1157  vancomycin (VANCOCIN) powder  Status:  Discontinued       As needed 09/28/19 1158 09/28/19 1457   09/28/19 0600  vancomycin (VANCOCIN) IVPB 1000 mg/200 mL premix  Status:  Discontinued     1,000 mg 200 mL/hr over 60 Minutes Intravenous On call to O.R. 09/27/19 1159 09/28/19 1607   09/28/19 0000  ceFAZolin (ANCEF) IVPB 2g/100 mL premix    Note to Pharmacy: Surgery delayed until 1/5.   2 g 200 mL/hr over 30 Minutes Intravenous To Surgery 09/22/19 0904 09/28/19 1222   09/22/19 0815  ceFAZolin (ANCEF) IVPB 2g/100 mL premix  Status:  Discontinued     2 g 200 mL/hr over 30 Minutes Intravenous To Surgery 09/22/19 0806 09/22/19 0905   09/15/19 1715  azithromycin (ZITHROMAX) 500 mg in sodium chloride 0.9 % 250 mL IVPB  Status:  Discontinued     500 mg 250 mL/hr over 60 Minutes Intravenous Every 24 hours 09/15/19 1711 09/18/19 1107   09/15/19 1715  cefTRIAXone (ROCEPHIN) 1 g in sodium chloride 0.9 % 100 mL IVPB     1 g 200 mL/hr over 30 Minutes Intravenous Every 24 hours 09/15/19 1711 09/21/19 1754     Subjective: Today, patient is more alert awake.  Communicative.  Denies any nausea vomiting or shortness of breath.  Objective: Vitals:   10/08/19 2318 10/09/19 0300  BP: (!) 140/42 136/70  Pulse: 64 70  Resp: 20 18  Temp: 98.9 F (37.2  C) 98.4 F (36.9 C)  SpO2: 95% 98%    Intake/Output Summary (Last 24 hours) at 10/09/2019 0735 Last data filed at 10/09/2019 0112 Gross per 24 hour  Intake --  Output 1950 ml  Net -1950 ml   Filed Weights   10/07/19 0500 10/08/19 0500 10/09/19 0416  Weight: 67.7 kg 67.8 kg 70.1 kg   Body mass index is 23.5 kg/m.   Physical Exam: GENERAL: Patient is alert awake and mildly communicative. comprehends the conversation.  Hard of hearing.  Appears deconditioned and ill.  Thinly built.  HENT: No scleral pallor or icterus. Pupils equally reactive to light. Oral mucosa is mildly dry.  Cortrak tube in place NECK: is supple, no palpable thyroid enlargement. CHEST: Diminished breath sounds bilaterally.  Occasional crackles on auscultation. CVS: S1 and S2 heard, no murmur. Regular rate  and rhythm. No pericardial rub. ABDOMEN: Soft, non-tender, bowel sounds are present. EXTREMITIES: No edema the left lower extremity.  Right below-knee amputation.  Left shoulder with staples from recent surgery.  Incision site clean dry and intact. CNS: Cranial nerves are intact.  Generalized weakness noted.  Able to comprehend and respond.  Alert awake and oriented to place and person. SKIN: warm and dry without rashes.  Left shoulder incision site clean dry and intact.  Data Review: I have personally reviewed the following laboratory data and studies,  CBC: Recent Labs  Lab 10/05/19 0549 10/06/19 0225 10/07/19 0341 10/08/19 0249 10/09/19 0315  WBC 4.3 5.9 6.7 5.6 6.4  HGB 10.8* 10.1* 9.5* 9.1* 9.7*  HCT 36.1* 32.3* 30.7* 29.0* 30.8*  MCV 101.4* 98.2 97.5 96.0 96.0  PLT 142* 138* 150 154 329   Basic Metabolic Panel: Recent Labs  Lab 10/03/19 0137 10/04/19 0536 10/05/19 0549 10/06/19 0225 10/07/19 0341 10/08/19 0249 10/09/19 0315  NA 149*   < > 152* 147* 144 140 138  K 4.5   < > 3.7 3.8 4.8 4.9 5.6*  CL 118*   < > 114* 114* 112* 110 108  CO2 18*   < > '24 22 25 ' 20* 21*  GLUCOSE 91   < >  157* 206* 169* 162* 145*  BUN 98*   < > 88* 76* 76* 72* 69*  CREATININE 8.28*   < > 7.18* 6.13* 5.66* 4.95* 4.71*  CALCIUM 7.6*   < > 7.6* 7.2* 7.2* 7.4* 7.6*  MG 2.0  --  2.0  --   --  2.0  --   PHOS 7.1*   < > 5.8* 4.0 4.2 4.8* 5.2*   < > = values in this interval not displayed.   Liver Function Tests: Recent Labs  Lab 10/05/19 0549 10/06/19 0225 10/07/19 0341 10/08/19 0249 10/09/19 0315  ALBUMIN 1.8* 1.7* 1.7* 1.6* 1.7*   No results for input(s): LIPASE, AMYLASE in the last 168 hours. No results for input(s): AMMONIA in the last 168 hours. Cardiac Enzymes: No results for input(s): CKTOTAL, CKMB, CKMBINDEX, TROPONINI in the last 168 hours. BNP (last 3 results) Recent Labs    09/15/19 1822  BNP 61.3    ProBNP (last 3 results) No results for input(s): PROBNP in the last 8760 hours.  CBG: Recent Labs  Lab 10/08/19 1146 10/08/19 1622 10/08/19 1959 10/08/19 2326 10/09/19 0416  GLUCAP 173* 143* 171* 176* 144*   Recent Results (from the past 240 hour(s))  Culture, respiratory (non-expectorated)     Status: None   Collection Time: 10/01/19 12:10 PM   Specimen: Tracheal Aspirate; Respiratory  Result Value Ref Range Status   Specimen Description TRACHEAL ASPIRATE  Final   Special Requests NONE  Final   Gram Stain   Final    FEW WBC PRESENT,BOTH PMN AND MONONUCLEAR RARE GRAM POSITIVE COCCI IN PAIRS Performed at [facility], [place] [address], [place], [place] [address]    Culture   Final    FEW PSEUDOMONAS AERUGINOSA FEW ENTEROBACTER CLOACAE    Report Status 10/04/2019 FINAL  Final   Organism ID, Bacteria PSEUDOMONAS AERUGINOSA  Final   Organism ID, Bacteria ENTEROBACTER CLOACAE  Final      Susceptibility   Enterobacter cloacae - MIC*    CEFAZOLIN >=64 RESISTANT Resistant     CEFEPIME 16 RESISTANT Resistant     CEFTAZIDIME >=64 RESISTANT Resistant     CIPROFLOXACIN <=0.25 SENSITIVE Sensitive     GENTAMICIN <=1 SENSITIVE  Sensitive     IMIPENEM 1 SENSITIVE  Sensitive     TRIMETH/SULFA <=20 SENSITIVE Sensitive     PIP/TAZO >=128 RESISTANT Resistant     * FEW ENTEROBACTER CLOACAE   Pseudomonas aeruginosa - MIC*    CEFTAZIDIME >=64 RESISTANT Resistant     CIPROFLOXACIN <=0.25 SENSITIVE Sensitive     GENTAMICIN 2 SENSITIVE Sensitive     IMIPENEM 1 SENSITIVE Sensitive     PIP/TAZO 32 SENSITIVE Sensitive     CEFEPIME 8 SENSITIVE Sensitive     * FEW PSEUDOMONAS AERUGINOSA     Studies: No results found.  Scheduled Meds: . chlorhexidine gluconate (MEDLINE KIT)  15 mL Mouth Rinse BID  . Chlorhexidine Gluconate Cloth  6 each Topical Daily  . darbepoetin (ARANESP) injection - NON-DIALYSIS  150 mcg Subcutaneous Q Thu-1800  . docusate  100 mg Per Tube BID  . free water  300 mL Per Tube Q4H  . insulin aspart  0-9 Units Subcutaneous Q4H  . mouth rinse  15 mL Mouth Rinse q12n4p  . pantoprazole sodium  40 mg Per Tube Daily  . pneumococcal 23 valent vaccine  0.5 mL Intramuscular Tomorrow-1000  . polyethylene glycol  17 g Per Tube BID    Continuous Infusions: . sodium chloride Stopped (10/05/19 2000)  . argatroban 1.45 mcg/kg/min (10/09/19 0631)  . feeding supplement (OSMOLITE 1.2 CAL) 1,000 mL (10/08/19 1834)  . meropenem (MERREM) IV 500 mg (10/08/19 1223)    Flora Lipps, MD  [facility] 10/09/2019

## 2019-10-10 LAB — RENAL FUNCTION PANEL
Albumin: 1.9 g/dL — ABNORMAL LOW (ref 3.5–5.0)
Anion gap: 8 (ref 5–15)
BUN: 66 mg/dL — ABNORMAL HIGH (ref 8–23)
CO2: 22 mmol/L (ref 22–32)
Calcium: 8.3 mg/dL — ABNORMAL LOW (ref 8.9–10.3)
Chloride: 106 mmol/L (ref 98–111)
Creatinine, Ser: 4.63 mg/dL — ABNORMAL HIGH (ref 0.61–1.24)
GFR calc Af Amer: 14 mL/min — ABNORMAL LOW (ref >60)
GFR calc non Af Amer: 13 mL/min — ABNORMAL LOW (ref >60)
Glucose, Bld: 108 mg/dL — ABNORMAL HIGH (ref 70–99)
Phosphorus: 5.2 mg/dL — ABNORMAL HIGH (ref 2.5–4.6)
Potassium: 5.1 mmol/L (ref 3.5–5.1)
Sodium: 136 mmol/L (ref 135–145)

## 2019-10-10 LAB — GLUCOSE, CAPILLARY
Glucose-Capillary: 107 mg/dL — ABNORMAL HIGH (ref 70–99)
Glucose-Capillary: 109 mg/dL — ABNORMAL HIGH (ref 70–99)
Glucose-Capillary: 114 mg/dL — ABNORMAL HIGH (ref 70–99)
Glucose-Capillary: 125 mg/dL — ABNORMAL HIGH (ref 70–99)
Glucose-Capillary: 91 mg/dL (ref 70–99)
Glucose-Capillary: 92 mg/dL (ref 70–99)

## 2019-10-10 LAB — CBC
HCT: 33.5 % — ABNORMAL LOW (ref 39.0–52.0)
Hemoglobin: 10.7 g/dL — ABNORMAL LOW (ref 13.0–17.0)
MCH: 30.7 pg (ref 26.0–34.0)
MCHC: 31.9 g/dL (ref 30.0–36.0)
MCV: 96.3 fL (ref 80.0–100.0)
Platelets: 248 10*3/uL (ref 150–400)
RBC: 3.48 MIL/uL — ABNORMAL LOW (ref 4.22–5.81)
RDW: 15 % (ref 11.5–15.5)
WBC: 7.8 10*3/uL (ref 4.0–10.5)
nRBC: 0 % (ref 0.0–0.2)

## 2019-10-10 LAB — APTT: aPTT: 34 seconds (ref 24–36)

## 2019-10-10 NOTE — Progress Notes (Signed)
PROGRESS NOTE  Ryan Villa TGG:[id number] DOB: 1956/09/05 DOA: 09/15/2019 PCP: Maryella Shivers, MD   LOS: 25 days   Brief narrative: As per HPI,  64 year old male was transferred from [facility] 12/23 for acute respiratory failure, rhabdomyolysis, acute kidney injury due to services unavailable at that facility.  He was in the ICU intubated and self extubated on 12/25. Transferred out of ICU 12/27. The patient was taken for right shoulder reverse arthroplasty 1/5. Early AM, 1/8 had vomiting with probable aspiration. Placed on NRB and transferred to ICU for intubation 10/01/2019. He was weaned to extubation on 10/04/2019. He has been evaluated by SLP and has a Cortrak tube in place.  Assessment/Plan:  Principal Problem:   Closed fracture of right proximal humerus Active Problems:   Acute respiratory failure (HCC)   AKI (acute kidney injury) ([place])   Rhabdomyolysis   Acute renal failure (ARF) (HCC)   Pressure injury of skin   Protein-calorie malnutrition, severe   Encounter for feeding tube placement   Aspiration into airway   Fracture  Acute hypoxic respiratory failure from Pseudomonal HCAP.  Extubated on 10/04/2019 and was transferred out of the ICU.  Currently on  IV meropenem -will aim 7 day course.  Was previously on cefepime. Speech therapy on board and is on Dysphagia I diet, puree.   Continue flutter valve.  Currently saturating 96% on room air.   Dysphagia and vomiting leading to aspiration pneumonia: Status post modified barium swallow.  Cortrak tube in place and patient has been seen by speech therapy at intervals  and recommend puree with nursing supervision.     Had repeat CXR on 1/13 without active disease.  Continue aspiration precautions.  Patient does have a chronic dysphagia and aspiration risk as per speech therapist.  Patient does not clearly understand the implications of feeding tube.  Severe protein calorie malnutrition: Present on admission.  Continue tube  feeding.  On dysphagia 1 diet.  Nutrition on board.    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  Creatinine of 4.7 today.  Baseline creatinine around 5.  No plans for HD so far.  Mild hyperkalemia today.  Hyperkalemia.  Mild.  Improved. Will need to closely monitor.    Hypernatremia: Improved.  Continue water flushes.  Check BMP closely.  Sodium of 136 today.  Anemia of critical illness and chronic disease: We will continue to monitor. Hemoglobin of 9.7. Transfuse if less than 7.  Hyperglycemia: Continue SSI q4hrs. 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 Platelet count of 284 today.   Essential Hypertension: off medications.  Overall stable.   Right proximal humerus fracture status post reverse shoulder arthroplasty 1/5:  Orthopedics on board.  VTE Prophylaxis: on eliquis  Code Status: Full code  Family Communication:  I spoke with the patient's mother Ms Ryan Villa and updated her about the clinical condition of the patient and discussed about poor nutrition. I also discussed about tube feeding but she is unsure what to do. Will see if we can get palliative care help [place] assist goals of care at this time.  Disposition Plan:  Skilled nursing facility placement as per physical therapy evaluation. Pending improvement in oral intake but patient will likely have chronic dysphagia with concerns for repeated aspiration, will complete IV antibiotics. Long term feeding might be an issue.  Consultants:  Orthopedic surgery  Wound Care  Nephrology  PCCM  Procedures:  Intubation x 2  ORIF of right shoulder  CorTrak tube placement  Antibiotics: Meropenem 1/11>  Anti-infectives (From admission, onward)   Start     Dose/Rate Route Frequency Ordered Stop   10/04/19 1230  meropenem (MERREM) 500 mg in sodium chloride 0.9 % 100 mL IVPB     500 mg 200 mL/hr over 30 Minutes Intravenous Every 24 hours 10/04/19 1213 10/11/19 1229   10/04/19 1000  ceFEPIme (MAXIPIME) 1 g  in sodium chloride 0.9 % 100 mL IVPB  Status:  Discontinued     1 g 200 mL/hr over 30 Minutes Intravenous Every 24 hours 10/03/19 0921 10/04/19 1213   10/03/19 0930  ceFEPIme (MAXIPIME) 1 g in sodium chloride 0.9 % 100 mL IVPB     1 g 200 mL/hr over 30 Minutes Intravenous STAT 10/03/19 0921 10/03/19 1100   10/03/19 0800  cefTAZidime (FORTAZ) 0.5 g in dextrose 5 % 50 mL IVPB  Status:  Discontinued     0.5 g 100 mL/hr over 30 Minutes Intravenous Every 24 hours 10/03/19 0705 10/03/19 0921   10/01/19 0800  Ampicillin-Sulbactam (UNASYN) 3 g in sodium chloride 0.9 % 100 mL IVPB  Status:  Discontinued     3 g 200 mL/hr over 30 Minutes Intravenous Every 12 hours 10/01/19 0640 10/03/19 0655   09/28/19 2030  ceFAZolin (ANCEF) IVPB 1 g/50 mL premix     1 g 100 mL/hr over 30 Minutes Intravenous Every 12 hours 09/28/19 1624 09/29/19 1910   09/28/19 1157  vancomycin (VANCOCIN) powder  Status:  Discontinued       As needed 09/28/19 1158 09/28/19 1457   09/28/19 0600  vancomycin (VANCOCIN) IVPB 1000 mg/200 mL premix  Status:  Discontinued     1,000 mg 200 mL/hr over 60 Minutes Intravenous On call to O.R. 09/27/19 1159 09/28/19 1607   09/28/19 0000  ceFAZolin (ANCEF) IVPB 2g/100 mL premix    Note to Pharmacy: Surgery delayed until 1/5.   2 g 200 mL/hr over 30 Minutes Intravenous To Surgery 09/22/19 0904 09/28/19 1222   09/22/19 0815  ceFAZolin (ANCEF) IVPB 2g/100 mL premix  Status:  Discontinued     2 g 200 mL/hr over 30 Minutes Intravenous To Surgery 09/22/19 0806 09/22/19 0905   09/15/19 1715  azithromycin (ZITHROMAX) 500 mg in sodium chloride 0.9 % 250 mL IVPB  Status:  Discontinued     500 mg 250 mL/hr over 60 Minutes Intravenous Every 24 hours 09/15/19 1711 09/18/19 1107   09/15/19 1715  cefTRIAXone (ROCEPHIN) 1 g in sodium chloride 0.9 % 100 mL IVPB     1 g 200 mL/hr over 30 Minutes Intravenous Every 24 hours 09/15/19 1711 09/21/19 1754     Subjective: Today, patient denies interval  complaints.  Alert awake and communicative.  Denies nausea vomiting.  Objective: Vitals:   10/09/19 2326 10/10/19 0338  BP: (!) 153/78 (!) 157/76  Pulse: 67 67  Resp: 18 19  Temp: 98.3 F (36.8 C) 97.6 F (36.4 C)  SpO2: 90% 95%    Intake/Output Summary (Last 24 hours) at 10/10/2019 0806 Last data filed at 10/09/2019 1700 Gross per 24 hour  Intake --  Output 650 ml  Net -650 ml   Filed Weights   10/07/19 0500 10/08/19 0500 10/09/19 0416  Weight: 67.7 kg 67.8 kg 70.1 kg   Body mass index is 23.5 kg/m.   Physical Exam: GENERAL: Alert awake mildly communicative. Hard of hearing.  Appears deconditioned and ill.  Thinly built.  Cotrack tube in place HENT: No scleral pallor or icterus. Pupils equally reactive to light. Oral mucosa is moist.  NECK: is supple, no palpable thyroid enlargement. CHEST: Diminished breath sounds bilaterally.  Occasional crackles on auscultation. CVS: S1 and S2 heard, no murmur. Regular rate and rhythm. No pericardial rub. ABDOMEN: Soft, non-tender, bowel sounds are present. EXTREMITIES: No edema the left lower extremity.  Right below-knee amputation.  Left shoulder with staples from recent surgery.  Incision site clean dry and intact. CNS: Cranial nerves are intact.  Generalized weakness noted. alert awake and oriented to place and person. SKIN: warm and dry without rashes.  Left shoulder incision site clean dry and intact.  Data Review: I have personally reviewed the following laboratory data and studies,  CBC: Recent Labs  Lab 10/05/19 0549 10/06/19 0225 10/07/19 0341 10/08/19 0249 10/09/19 0315  WBC 4.3 5.9 6.7 5.6 6.4  HGB 10.8* 10.1* 9.5* 9.1* 9.7*  HCT 36.1* 32.3* 30.7* 29.0* 30.8*  MCV 101.4* 98.2 97.5 96.0 96.0  PLT 142* 138* 150 154 124   Basic Metabolic Panel: Recent Labs  Lab 10/05/19 0549 10/06/19 0225 10/07/19 0341 10/08/19 0249 10/09/19 0315  NA 152* 147* 144 140 138  K 3.7 3.8 4.8 4.9 5.6*  CL 114* 114* 112* 110 108   CO2 _0 20* 21*  GLUCOSE 157* 206* 169* 162* 145*  BUN 88* 76* 76* 72* 69*  CREATININE 7.18* 6.13* 5.66* 4.95* 4.71*  CALCIUM 7.6* 7.2* 7.2* 7.4* 7.6*  MG 2.0  --   --  2.0  --   PHOS 5.8* 4.0 4.2 4.8* 5.2*   Liver Function Tests: Recent Labs  Lab 10/05/19 0549 10/06/19 0225 10/07/19 0341 10/08/19 0249 10/09/19 0315  ALBUMIN 1.8* 1.7* 1.7* 1.6* 1.7*   No results for input(s): LIPASE, AMYLASE in the last 168 hours. No results for input(s): AMMONIA in the last 168 hours. Cardiac Enzymes: No results for input(s): CKTOTAL, CKMB, CKMBINDEX, TROPONINI in the last 168 hours. BNP (last 3 results) Recent Labs    09/15/19 1822  BNP 61.3    ProBNP (last 3 results) No results for input(s): PROBNP in the last 8760 hours.  CBG: Recent Labs  Lab 10/09/19 1125 10/09/19 1624 10/09/19 2010 10/09/19 2324 10/10/19 0602  GLUCAP 122* 119* 108* 106* 107*   Recent Results (from the past 240 hour(s))  Culture, respiratory (non-expectorated)     Status: None   Collection Time: 10/01/19 12:10 PM   Specimen: Tracheal Aspirate; Respiratory  Result Value Ref Range Status   Specimen Description TRACHEAL ASPIRATE  Final   Special Requests NONE  Final   Gram Stain   Final    FEW WBC PRESENT,BOTH PMN AND MONONUCLEAR RARE GRAM POSITIVE COCCI IN PAIRS Performed at [facility], [place] [address], [place], [place] [address]    Culture   Final    FEW PSEUDOMONAS AERUGINOSA FEW ENTEROBACTER CLOACAE    Report Status 10/04/2019 FINAL  Final   Organism ID, Bacteria PSEUDOMONAS AERUGINOSA  Final   Organism ID, Bacteria ENTEROBACTER CLOACAE  Final      Susceptibility   Enterobacter cloacae - MIC*    CEFAZOLIN >=64 RESISTANT Resistant     CEFEPIME 16 RESISTANT Resistant     CEFTAZIDIME >=64 RESISTANT Resistant     CIPROFLOXACIN <=0.25 SENSITIVE Sensitive     GENTAMICIN <=1 SENSITIVE Sensitive     IMIPENEM 1 SENSITIVE Sensitive     TRIMETH/SULFA <=20 SENSITIVE Sensitive     PIP/TAZO  >=128 RESISTANT Resistant     * FEW ENTEROBACTER CLOACAE   Pseudomonas aeruginosa - MIC*    CEFTAZIDIME >=64  RESISTANT Resistant     CIPROFLOXACIN <=0.25 SENSITIVE Sensitive     GENTAMICIN 2 SENSITIVE Sensitive     IMIPENEM 1 SENSITIVE Sensitive     PIP/TAZO 32 SENSITIVE Sensitive     CEFEPIME 8 SENSITIVE Sensitive     * FEW PSEUDOMONAS AERUGINOSA     Studies: No results found.  Scheduled Meds: . apixaban  5 mg Per Tube BID  . chlorhexidine gluconate (MEDLINE KIT)  15 mL Mouth Rinse BID  . Chlorhexidine Gluconate Cloth  6 each Topical Daily  . darbepoetin (ARANESP) injection - NON-DIALYSIS  150 mcg Subcutaneous Q Thu-1800  . docusate  100 mg Per Tube BID  . feeding supplement (PRO-STAT SUGAR FREE 64)  30 mL Per Tube Daily  . free water  300 mL Per Tube Q4H  . insulin aspart  0-9 Units Subcutaneous Q4H  . mouth rinse  15 mL Mouth Rinse q12n4p  . pantoprazole sodium  40 mg Per Tube Daily  . pneumococcal 23 valent vaccine  0.5 mL Intramuscular Tomorrow-1000  . polyethylene glycol  17 g Per Tube BID    Continuous Infusions: . sodium chloride Stopped (10/05/19 2000)  . feeding supplement (NEPRO CARB STEADY) 1,000 mL (10/09/19 1313)  . meropenem (MERREM) IV 500 mg (10/09/19 1133)    Flora Lipps, MD  [facility] 10/10/2019

## 2019-10-11 DIAGNOSIS — Z7189 Other specified counseling: Secondary | ICD-10-CM

## 2019-10-11 DIAGNOSIS — Z515 Encounter for palliative care: Secondary | ICD-10-CM

## 2019-10-11 LAB — RENAL FUNCTION PANEL
Albumin: 1.9 g/dL — ABNORMAL LOW (ref 3.5–5.0)
Anion gap: 9 (ref 5–15)
BUN: 67 mg/dL — ABNORMAL HIGH (ref 8–23)
CO2: 22 mmol/L (ref 22–32)
Calcium: 8.3 mg/dL — ABNORMAL LOW (ref 8.9–10.3)
Chloride: 106 mmol/L (ref 98–111)
Creatinine, Ser: 4.59 mg/dL — ABNORMAL HIGH (ref 0.61–1.24)
GFR calc Af Amer: 15 mL/min — ABNORMAL LOW (ref >60)
GFR calc non Af Amer: 13 mL/min — ABNORMAL LOW (ref >60)
Glucose, Bld: 116 mg/dL — ABNORMAL HIGH (ref 70–99)
Phosphorus: 5.7 mg/dL — ABNORMAL HIGH (ref 2.5–4.6)
Potassium: 5.4 mmol/L — ABNORMAL HIGH (ref 3.5–5.1)
Sodium: 137 mmol/L (ref 135–145)

## 2019-10-11 LAB — CBC
HCT: 33.5 % — ABNORMAL LOW (ref 39.0–52.0)
Hemoglobin: 10.6 g/dL — ABNORMAL LOW (ref 13.0–17.0)
MCH: 30.5 pg (ref 26.0–34.0)
MCHC: 31.6 g/dL (ref 30.0–36.0)
MCV: 96.5 fL (ref 80.0–100.0)
Platelets: 262 10*3/uL (ref 150–400)
RBC: 3.47 MIL/uL — ABNORMAL LOW (ref 4.22–5.81)
RDW: 15.4 % (ref 11.5–15.5)
WBC: 8.6 10*3/uL (ref 4.0–10.5)
nRBC: 0 % (ref 0.0–0.2)

## 2019-10-11 LAB — APTT: aPTT: 34 seconds (ref 24–36)

## 2019-10-11 LAB — GLUCOSE, CAPILLARY
Glucose-Capillary: 93 mg/dL (ref 70–99)
Glucose-Capillary: 123 mg/dL — ABNORMAL HIGH (ref 70–99)
Glucose-Capillary: 125 mg/dL — ABNORMAL HIGH (ref 70–99)
Glucose-Capillary: 94 mg/dL (ref 70–99)
Glucose-Capillary: 120 mg/dL — ABNORMAL HIGH (ref 70–99)

## 2019-10-11 NOTE — Progress Notes (Signed)
  Speech Language Pathology Treatment: Dysphagia  Patient Details Name: Ryan Villa MRN: [id number] DOB: [birth year] Today's Date: 10/11/2019 Time: [id number] SLP Time Calculation (min) (ACUTE ONLY): 15 min  Assessment / Plan / Recommendation Clinical Impression  Followed up for skilled dysphagia feedings. Pts dysphagia severity remains severe overall as consistent with most recent MBSS 10/06/19. SLP assisted with pt positioning fully upright with 1/2 teaspoons of puree and use of chin tuck. Pt still requiring moderate to maximal tactile cues to implement full chin tuck before and during the swallow. Bolus propulsion appeared delayed with lingual pumping and suspected delay in swallow initiation per palpation. Following approximately 2 ounces of puree trials, pt with overt delayed cough/congested weaker at baseline, concerning for reduced airway protection. Per chart review, palliative care still discussing wishes with family. Concern for inadequate nutrition remains even if oral nutrition is desired by pt/family despite patients significant aspiration risk. SLP to continue to follow for dysphagia intervention and goals of care.     HPI HPI: 64 year old male transferred from [facility] for acute respiratory failure, rhabdomyolysis (fell and laid on floor 3 days), acute kidney injury. PMH: CKD stage IV, diabetes mellitus type 2, hyperlipidemia, hypertension, left carotid stenosis, intracerebral hemorrhage, status post right BKA. Per chart EMS reported that the patient's sister was bringing him food while he was lying on the floor during that 3-day period. Intubated 12/23-12/24 (self extubated). Found to have right humeral neck fracture and right upper lobe consolidation per chest x-ray, head CT no acute intracranial process,   Pt underwent MBS 12/28 with findings of severe dysphagia - asp of nectar/honey thick.  Repeat MBS 1/2 recommended Dys 1 diet and nectar thick liquids with chin tuck. On 1/8 pt had  vomiting and suspected aspiration requiring reintubation; extuabted 1/11.      SLP Plan  Continue with current plan of care       Recommendations  Diet recommendations: NPO;Other(comment)(skilled dysphagia 1 feedings with SLP, RN as tolerated with use of chin tuck ) Medication Administration: Via alternative means Supervision: Full supervision/cueing for compensatory strategies;Staff to assist with self feeding;Patient able to self feed Compensations: Chin tuck Postural Changes and/or Swallow Maneuvers: Out of bed for meals;Seated upright 90 degrees                Oral Care Recommendations: Oral care QID Follow up Recommendations: Skilled Nursing facility SLP Visit Diagnosis: Dysphagia, oropharyngeal phase (R13.12) Plan: Continue with current plan of care       [place] MA, CCC-SLP  Acute Rehabilitation Services  10/11/2019, 1:06 PM

## 2019-10-11 NOTE — Progress Notes (Signed)
Occupational Therapy Treatment Patient Details Name: Ryan Villa MRN: [id number] DOB: 01/24/1956 Today's Date: 10/11/2019    History of present illness Pt adm with rt humeral fx after fall at home and in the floor x 3 days. Pt intubated 12/23 and self extubated 12/25. Pt with acute metabolic encephalopathy and delirium, PMH -R BKA, ckd, dm, htn, intracerebral hemorrhage. Pt now s/p R TSA 1/5. Pt re-intubated 1/8 due to suspected aspiration PNA.   OT comments  Upon arrival pt slouched in bed with sling off and laying on his RUE. RN arrived and educated him on importance of proper positioning of RUE with sling and with pillows. Pt limited this session secondary to level of arousal. He kept his eyes shut majority of the session and did not respond to painful stimulus. Pt soiled required totalA for all aspects of mobility and cleaning. Pt repositioned in bed fully supported with pillows around supporting hips, BUE, and head to promote proper positioning. RN reports pt to have mtg with Palliative, unsure when. Will continue to follow acutely to address RUE and proper positioning of RUE and pt in the bed.   Follow Up Recommendations  SNF;Supervision/Assistance - 24 hour    Equipment Recommendations  None recommended by OT    Recommendations for Other Services      Precautions / Restrictions Precautions Precautions: Fall;Shoulder Type of Shoulder Precautions: okay to WB with transfers, otherwise no lifting; sling on when not ambulating or doing BADLs, okay for pendulums; no abduction; ER: 30 degrees; no AROM; okay hand wrist and elbow AROM Shoulder Interventions: Shoulder sling/immobilizer;For comfort Precaution Comments: R humerus ORIF 1/5, RUE flaccid on 1/15 Required Braces or Orthoses: Sling Restrictions RUE Weight Bearing: Non weight bearing Other Position/Activity Restrictions: able to WB with RW with transfers        Mobility Bed Mobility Overal bed mobility: Needs Assistance Bed  Mobility: Rolling Rolling: Total assist;+2 for physical assistance;+2 for safety/equipment            Transfers                 General transfer comment: deferred    Balance       Sitting balance - Comments: did not attempt                                   ADL either performed or assessed with clinical judgement   ADL Overall ADL's : Needs assistance/impaired                                       General ADL Comments: pt totalA with all ADL this date;he was soiled, required totalA+2 for rolling and all aspects of cleaning     Vision   Additional Comments: continue to assess   Perception     Praxis      Cognition Arousal/Alertness: Awake/alert Behavior During Therapy: Flat affect Overall Cognitive Status: Difficult to assess                                 General Comments: pt with eyes remained closed for majority of the session, would intermittently open them and appear to scan the room, but did not appear to visually attend, eyes would remain open for <5seconds;pt opened eyes to movement of  RUE but otherwise did not respond to painful stimuli        Exercises Other Exercises Other Exercises: PROM to shoulder- flex and ER; elbow, wrist and hand, AAROM.   Shoulder Instructions       General Comments      Pertinent Vitals/ Pain       Pain Assessment: Faces Faces Pain Scale: No hurt Pain Location: not responding to painful stimuli this date Pain Descriptors / Indicators: Grimacing;Guarding Pain Intervention(s): Limited activity within patient's tolerance;Monitored during session  Home Living                                          Prior Functioning/Environment              Frequency  Min 2X/week        Progress Toward Goals  OT Goals(current goals can now be found in the care plan section)  Progress towards OT goals: Not progressing toward goals - comment(level of  arousal)  Acute Rehab OT Goals Patient Stated Goal: none stated OT Goal Formulation: With patient Time For Goal Achievement: 10/19/19 Potential to Achieve Goals: Fair ADL Goals Pt Will Perform Grooming: with min assist;sitting Pt Will Perform Upper Body Bathing: with min assist;sitting Pt Will Perform Lower Body Dressing: with mod assist;with adaptive equipment;sitting/lateral leans Pt Will Transfer to Toilet: with min assist;stand pivot transfer;squat pivot transfer;bedside commode Additional ADL Goal #1: Patient will follow 1 step commands with 90% accuracy to optimize independence with ADLs. Additional ADL Goal #2: Patient will complete bed mobilty with min assist and maintain sitting EOB for 10 minutes dynamically with supervision as precursor to ADls.  Plan Discharge plan remains appropriate;Frequency remains appropriate    Co-evaluation                 AM-PAC OT "6 Clicks" Daily Activity     Outcome Measure   Help from another person eating meals?: Total Help from another person taking care of personal grooming?: Total Help from another person toileting, which includes using toliet, bedpan, or urinal?: Total Help from another person bathing (including washing, rinsing, drying)?: Total Help from another person to put on and taking off regular upper body clothing?: Total Help from another person to put on and taking off regular lower body clothing?: Total 6 Click Score: 6    End of Session Equipment Utilized During Treatment: Other (comment)(sling)  OT Visit Diagnosis: Muscle weakness (generalized) (M62.81);Other symptoms and signs involving cognitive function Pain - Right/Left: Right Pain - part of body: Shoulder;Arm   Activity Tolerance Patient limited by lethargy;Patient limited by fatigue;Other (comment)(level of arousal)   Patient Left in bed;with call bell/phone within reach;with bed alarm set   Nurse Communication Mobility status;Other (comment)(positioning,  sling)        Time: [id number] OT Time Calculation (min): 29 min  Charges: OT General Charges $OT Visit: 1 Visit OT Treatments $Self Care/Home Management : 23-37 mins  Dorinda Hill OTR/L [place] Office: [place] 10/11/2019, 4:00 PM

## 2019-10-11 NOTE — Progress Notes (Signed)
PROGRESS NOTE  Ryan Villa PFX:[id number] DOB: 1956-03-30 DOA: 09/15/2019 PCP: Maryella Shivers, MD   LOS: 26 days   Brief narrative: As per HPI,  64 year old male was transferred from [facility] 12/23 for acute respiratory failure, rhabdomyolysis, acute kidney injury due to services unavailable at that facility.  He was in the ICU intubated and self extubated on 12/25. Transferred out of ICU 12/27. The patient was taken for right shoulder reverse arthroplasty 1/5. Early AM, 1/8 had vomiting with probable aspiration. Placed on NRB and transferred to ICU for intubation 10/01/2019. He was weaned to extubation on 10/04/2019. He has been evaluated by SLP and has a Cortrak tube in place.  Assessment/Plan:  Principal Problem:   Closed fracture of right proximal humerus Active Problems:   Acute respiratory failure (HCC)   AKI (acute kidney injury) ([place])   Rhabdomyolysis   Acute renal failure (ARF) (HCC)   Pressure injury of skin   Protein-calorie malnutrition, severe   Encounter for feeding tube placement   Aspiration into airway   Fracture  Acute hypoxic respiratory failure from Pseudomonal HCAP. resolved. Extubated on 10/04/2019 and was transferred out of the ICU.  completed IV meropenem x 7 day course.  Was previously on cefepime. Speech therapy on board and is on Dysphagia I diet, puree.   Continue flutter valve.  Currently saturating 98% on room air.   Dysphagia and vomiting leading to aspiration pneumonia: Status post modified barium swallow.  Cortrak tube in place and patient has been seen by speech therapy at intervals  and recommend puree with nursing supervision.     Had repeat CXR on 1/13 without active disease.  Continue aspiration precautions.  Patient does have a chronic dysphagia and aspiration risk as per speech therapist.  Difficulty maintaining nutrition  Severe protein calorie malnutrition: Present on admission.  Currently on cortrak tube feeding.   Nutrition on  board.  Patient has high aspiration risk and is unable to meet nutritional demands unless he has long term plans for enteral feeding.  I spoke with the patient's mother yesterday about it but I am not convinced that she has fully comprehended that.  She was unable to give me a decision.  Palliative care has been consulted to discuss about goals of care and nutritional issues.  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  Creatinine of 4.5 today and improving..  Baseline creatinine around 5.  No plans for HD  As per nephrology.    Hyperkalemia.  Mild.  Will need to closely monitor with BMP..  Tube feedings have been changed.  Hypernatremia: Improved.  Continue water flushes.  Check BMP closely.  Sodium of 137 today  Anemia of critical illness and chronic disease: We will continue to monitor. Hemoglobin of 10.6. Transfuse if less than 7.  Hyperglycemia: Continue SSI q4hrs. latest POC glucose was 125  Heparin-induced thrombocytopenia. was on argatroban and switched to eliquis. Platelet count of 262 today.   Essential Hypertension: off medications. Closely monitor.  Right proximal humerus fracture status post reverse shoulder arthroplasty 1/5.  On Sling.  Will need outpatient orthopedic follow-up.  VTE Prophylaxis:  eliquis  Code Status: Full code  Family Communication:  Spoke with the patient's mother at length yesterday regarding the patient's clinical condition.  Disposition Plan:  Skilled nursing facility placement as per physical therapy evaluation. Patient has chronic dysphagia and is still on cortak tube. Long term feeding is a big issue as the patient is severely malnourished and has aspiration risk.  Patient's mother not able  to make decisions on PEG tube feeding, consulted palliative care to discuss about goals of care  Consultants:  Orthopedic surgery  Wound Care  Nephrology  PCCM  Procedures:  Intubation x 2  ORIF of right shoulder  CorTrak tube  placement  Antibiotics: Meropenem 1/11>  Anti-infectives (From admission, onward)   Start     Dose/Rate Route Frequency Ordered Stop   10/04/19 1230  meropenem (MERREM) 500 mg in sodium chloride 0.9 % 100 mL IVPB     500 mg 200 mL/hr over 30 Minutes Intravenous Every 24 hours 10/04/19 1213 10/10/19 1231   10/04/19 1000  ceFEPIme (MAXIPIME) 1 g in sodium chloride 0.9 % 100 mL IVPB  Status:  Discontinued     1 g 200 mL/hr over 30 Minutes Intravenous Every 24 hours 10/03/19 0921 10/04/19 1213   10/03/19 0930  ceFEPIme (MAXIPIME) 1 g in sodium chloride 0.9 % 100 mL IVPB     1 g 200 mL/hr over 30 Minutes Intravenous STAT 10/03/19 0921 10/03/19 1100   10/03/19 0800  cefTAZidime (FORTAZ) 0.5 g in dextrose 5 % 50 mL IVPB  Status:  Discontinued     0.5 g 100 mL/hr over 30 Minutes Intravenous Every 24 hours 10/03/19 0705 10/03/19 0921   10/01/19 0800  Ampicillin-Sulbactam (UNASYN) 3 g in sodium chloride 0.9 % 100 mL IVPB  Status:  Discontinued     3 g 200 mL/hr over 30 Minutes Intravenous Every 12 hours 10/01/19 0640 10/03/19 0655   09/28/19 2030  ceFAZolin (ANCEF) IVPB 1 g/50 mL premix     1 g 100 mL/hr over 30 Minutes Intravenous Every 12 hours 09/28/19 1624 09/29/19 1910   09/28/19 1157  vancomycin (VANCOCIN) powder  Status:  Discontinued       As needed 09/28/19 1158 09/28/19 1457   09/28/19 0600  vancomycin (VANCOCIN) IVPB 1000 mg/200 mL premix  Status:  Discontinued     1,000 mg 200 mL/hr over 60 Minutes Intravenous On call to O.R. 09/27/19 1159 09/28/19 1607   09/28/19 0000  ceFAZolin (ANCEF) IVPB 2g/100 mL premix    Note to Pharmacy: Surgery delayed until 1/5.   2 g 200 mL/hr over 30 Minutes Intravenous To Surgery 09/22/19 0904 09/28/19 1222   09/22/19 0815  ceFAZolin (ANCEF) IVPB 2g/100 mL premix  Status:  Discontinued     2 g 200 mL/hr over 30 Minutes Intravenous To Surgery 09/22/19 0806 09/22/19 0905   09/15/19 1715  azithromycin (ZITHROMAX) 500 mg in sodium chloride 0.9 % 250  mL IVPB  Status:  Discontinued     500 mg 250 mL/hr over 60 Minutes Intravenous Every 24 hours 09/15/19 1711 09/18/19 1107   09/15/19 1715  cefTRIAXone (ROCEPHIN) 1 g in sodium chloride 0.9 % 100 mL IVPB     1 g 200 mL/hr over 30 Minutes Intravenous Every 24 hours 09/15/19 1711 09/21/19 1754     Subjective: Today, patient appears slightly somnolent.  Not much verbal.  Objective: Vitals:   10/11/19 0026 10/11/19 0355  BP: 138/72 (!) 144/78  Pulse: 64 67  Resp: 18 18  Temp: 97.6 F (36.4 C) 98 F (36.7 C)  SpO2: 93% 93%    Intake/Output Summary (Last 24 hours) at 10/11/2019 0803 Last data filed at 10/10/2019 1750 Gross per 24 hour  Intake 1561.11 ml  Output 2175 ml  Net -613.89 ml   Filed Weights   10/08/19 0500 10/09/19 0416 10/11/19 0358  Weight: 67.8 kg 70.1 kg 69 kg   Body mass index  is 23.13 kg/m.   Physical Exam:  General: Thinly built, not in obvious distress.  Mildly somnolent.  Not much verbal.  Deconditioned and ill. HENT: Normocephalic, pupils equally reacting to light and accommodation.  No scleral pallor or icterus noted. Oral mucosa is moist.  Cortrak tube in place. Chest: Decreased breath sounds bilaterally.  Coarse breath sounds noted. CVS: S1 &S2 heard. No murmur.  Regular rate and rhythm. Abdomen: Soft, nontender, nondistended.  Bowel sounds are heard.  Liver is not palpable, no abdominal mass palpated Extremities: No cyanosis, clubbing or edema.  Peripheral pulses are palpable.  Left shoulder with staples from recent surgery-site clean dry and intact. Psych: Mildly somnolent. CNS: Moving all extremities but generalized weakness noted.  Mildly somnolent today. Skin: Warm and dry.  Left shoulder incision site clean dry and intact  Data Review: I have personally reviewed the following laboratory data and studies,  CBC: Recent Labs  Lab 10/07/19 0341 10/08/19 0249 10/09/19 0315 10/10/19 0727 10/11/19 0240  WBC 6.7 5.6 6.4 7.8 8.6  HGB 9.5* 9.1*  9.7* 10.7* 10.6*  HCT 30.7* 29.0* 30.8* 33.5* 33.5*  MCV 97.5 96.0 96.0 96.3 96.5  PLT 150 154 184 248 024   Basic Metabolic Panel: Recent Labs  Lab 10/05/19 0549 10/06/19 0225 10/07/19 0341 10/08/19 0249 10/09/19 0315 10/10/19 0727 10/11/19 0240  NA 152*   < > 144 140 138 136 137  K 3.7   < > 4.8 4.9 5.6* 5.1 5.4*  CL 114*   < > 112* 110 108 106 106  CO2 24   < > 25 20* 21* 22 22  GLUCOSE 157*   < > 169* 162* 145* 108* 116*  BUN 88*   < > 76* 72* 69* 66* 67*  CREATININE 7.18*   < > 5.66* 4.95* 4.71* 4.63* 4.59*  CALCIUM 7.6*   < > 7.2* 7.4* 7.6* 8.3* 8.3*  MG 2.0  --   --  2.0  --   --   --   PHOS 5.8*   < > 4.2 4.8* 5.2* 5.2* 5.7*   < > = values in this interval not displayed.   Liver Function Tests: Recent Labs  Lab 10/07/19 0341 10/08/19 0249 10/09/19 0315 10/10/19 0727 10/11/19 0240  ALBUMIN 1.7* 1.6* 1.7* 1.9* 1.9*   No results for input(s): LIPASE, AMYLASE in the last 168 hours. No results for input(s): AMMONIA in the last 168 hours. Cardiac Enzymes: No results for input(s): CKTOTAL, CKMB, CKMBINDEX, TROPONINI in the last 168 hours. BNP (last 3 results) Recent Labs    09/15/19 1822  BNP 61.3    ProBNP (last 3 results) No results for input(s): PROBNP in the last 8760 hours.  CBG: Recent Labs  Lab 10/10/19 1136 10/10/19 1700 10/10/19 1933 10/10/19 2334 10/11/19 0347  GLUCAP 125* 91 92 109* 93   Recent Results (from the past 240 hour(s))  Culture, respiratory (non-expectorated)     Status: None   Collection Time: 10/01/19 12:10 PM   Specimen: Tracheal Aspirate; Respiratory  Result Value Ref Range Status   Specimen Description TRACHEAL ASPIRATE  Final   Special Requests NONE  Final   Gram Stain   Final    FEW WBC PRESENT,BOTH PMN AND MONONUCLEAR RARE GRAM POSITIVE COCCI IN PAIRS Performed at [facility], [place] [address] [place], [place] [address]    Culture   Final    FEW PSEUDOMONAS AERUGINOSA FEW ENTEROBACTER CLOACAE    Report  Status 10/04/2019 FINAL  Final  Organism ID, Bacteria PSEUDOMONAS AERUGINOSA  Final   Organism ID, Bacteria ENTEROBACTER CLOACAE  Final      Susceptibility   Enterobacter cloacae - MIC*    CEFAZOLIN >=64 RESISTANT Resistant     CEFEPIME 16 RESISTANT Resistant     CEFTAZIDIME >=64 RESISTANT Resistant     CIPROFLOXACIN <=0.25 SENSITIVE Sensitive     GENTAMICIN <=1 SENSITIVE Sensitive     IMIPENEM 1 SENSITIVE Sensitive     TRIMETH/SULFA <=20 SENSITIVE Sensitive     PIP/TAZO >=128 RESISTANT Resistant     * FEW ENTEROBACTER CLOACAE   Pseudomonas aeruginosa - MIC*    CEFTAZIDIME >=64 RESISTANT Resistant     CIPROFLOXACIN <=0.25 SENSITIVE Sensitive     GENTAMICIN 2 SENSITIVE Sensitive     IMIPENEM 1 SENSITIVE Sensitive     PIP/TAZO 32 SENSITIVE Sensitive     CEFEPIME 8 SENSITIVE Sensitive     * FEW PSEUDOMONAS AERUGINOSA     Studies: No results found.  Scheduled Meds: . apixaban  5 mg Per Tube BID  . chlorhexidine gluconate (MEDLINE KIT)  15 mL Mouth Rinse BID  . Chlorhexidine Gluconate Cloth  6 each Topical Daily  . darbepoetin (ARANESP) injection - NON-DIALYSIS  150 mcg Subcutaneous Q Thu-1800  . docusate  100 mg Per Tube BID  . feeding supplement (PRO-STAT SUGAR FREE 64)  30 mL Per Tube Daily  . free water  300 mL Per Tube Q4H  . insulin aspart  0-9 Units Subcutaneous Q4H  . mouth rinse  15 mL Mouth Rinse q12n4p  . pantoprazole sodium  40 mg Per Tube Daily  . pneumococcal 23 valent vaccine  0.5 mL Intramuscular Tomorrow-1000  . polyethylene glycol  17 g Per Tube BID    Continuous Infusions: . sodium chloride Stopped (10/05/19 2000)  . feeding supplement (NEPRO CARB STEADY) 1,000 mL (10/10/19 1342)    Flora Lipps, MD  [facility] 10/11/2019

## 2019-10-11 NOTE — Progress Notes (Signed)
PALLIATIVE NOTE:  Referral received for goals of care discussion. Patient assessed at the bedside. Cortrak in place. Patient lethargic and will not follow commands or open eyes. Brother was at the bedside per staff but had to leave.   I called and spoke with brother and mother via phone. I introduced myself and the role of Palliative Medicine. Family would like to meet face to face and have goals of care discussion versus phone conference. Mother would like to physically see patient to assist with their decision making.   Family requesting to meet tomorrow at 11am for [place]. I discussed with Charge, RN and patient's family will be allowed for meeting. I educated family on visitation policy and 2 person allowance for [place] meeting only. They verbalized understanding and appreciation.   As requested I will plan to meet family at the bedside tomorrow 10/12/19 @ 11am for [place] discussion.   Detailed notes and recommendations to follow.   Thank you for your referral and allowing Palliative to assist in the care of [name].   Total Time: 20 min.   Greater than 50%  of this time was spent counseling and coordinating care related to the above assessment and plan.   Alda Lea, AGPCNP-BC Palliative Medicine Team  Phone: [phone] Fax: [phone] Pager: [phone] Amion: Delane Ginger. [place]

## 2019-10-12 DIAGNOSIS — Q842 Other congenital malformations of hair: Secondary | ICD-10-CM

## 2019-10-12 LAB — GLUCOSE, CAPILLARY
Glucose-Capillary: 102 mg/dL — ABNORMAL HIGH (ref 70–99)
Glucose-Capillary: 106 mg/dL — ABNORMAL HIGH (ref 70–99)
Glucose-Capillary: 118 mg/dL — ABNORMAL HIGH (ref 70–99)
Glucose-Capillary: 118 mg/dL — ABNORMAL HIGH (ref 70–99)
Glucose-Capillary: 120 mg/dL — ABNORMAL HIGH (ref 70–99)
Glucose-Capillary: 126 mg/dL — ABNORMAL HIGH (ref 70–99)
Glucose-Capillary: 126 mg/dL — ABNORMAL HIGH (ref 70–99)

## 2019-10-12 LAB — RENAL FUNCTION PANEL
Albumin: 1.9 g/dL — ABNORMAL LOW (ref 3.5–5.0)
Anion gap: 9 (ref 5–15)
BUN: 71 mg/dL — ABNORMAL HIGH (ref 8–23)
CO2: 20 mmol/L — ABNORMAL LOW (ref 22–32)
Calcium: 7.9 mg/dL — ABNORMAL LOW (ref 8.9–10.3)
Chloride: 108 mmol/L (ref 98–111)
Creatinine, Ser: 4.84 mg/dL — ABNORMAL HIGH (ref 0.61–1.24)
GFR calc Af Amer: 14 mL/min — ABNORMAL LOW (ref >60)
GFR calc non Af Amer: 12 mL/min — ABNORMAL LOW (ref >60)
Glucose, Bld: 123 mg/dL — ABNORMAL HIGH (ref 70–99)
Phosphorus: 6.3 mg/dL — ABNORMAL HIGH (ref 2.5–4.6)
Potassium: 5.5 mmol/L — ABNORMAL HIGH (ref 3.5–5.1)
Sodium: 137 mmol/L (ref 135–145)

## 2019-10-12 LAB — CBC
HCT: 34.1 % — ABNORMAL LOW (ref 39.0–52.0)
Hemoglobin: 10.5 g/dL — ABNORMAL LOW (ref 13.0–17.0)
MCH: 31 pg (ref 26.0–34.0)
MCHC: 30.8 g/dL (ref 30.0–36.0)
MCV: 100.6 fL — ABNORMAL HIGH (ref 80.0–100.0)
Platelets: 265 10*3/uL (ref 150–400)
RBC: 3.39 MIL/uL — ABNORMAL LOW (ref 4.22–5.81)
RDW: 16.1 % — ABNORMAL HIGH (ref 11.5–15.5)
WBC: 7.4 10*3/uL (ref 4.0–10.5)
nRBC: 0 % (ref 0.0–0.2)

## 2019-10-12 LAB — APTT: aPTT: 34 seconds (ref 24–36)

## 2019-10-12 NOTE — Progress Notes (Signed)
PROGRESS NOTE  Ryan Villa ZOX:[id number] DOB: [birth year] DOA: 09/15/2019 PCP: Maryella Shivers, MD   LOS: 27 days   Brief narrative: As per HPI,  64 year old male was transferred from [facility] 12/23 for acute respiratory failure, rhabdomyolysis, acute kidney injury due to services unavailable at that facility.  He was in the ICU intubated and self extubated on 12/25. Transferred out of ICU 12/27. The patient was taken for right shoulder reverse arthroplasty 1/5. Early AM, 1/8 had vomiting with probable aspiration. Placed on NRB and transferred to ICU for intubation 10/01/2019. He was weaned to extubation on 10/04/2019. He has been evaluated by SLP and has a Cortrak tube in place.  Assessment/Plan:  Principal Problem:   Closed fracture of right proximal humerus Active Problems:   Acute respiratory failure (HCC)   AKI (acute kidney injury) ([place])   Rhabdomyolysis   Acute renal failure (ARF) (HCC)   Pressure injury of skin   Protein-calorie malnutrition, severe   Encounter for feeding tube placement   Aspiration into airway   Fracture  Acute hypoxic respiratory failure from Pseudomonal HCAP. resolved. Extubated on 10/04/2019 and was transferred out of the ICU.  Completed IV meropenem x 7 day course. Speech therapy on board and is on cortak tube feeding.  Continue flutter valve.  Currently saturating 98% on room air.   Dysphagia and vomiting leading to aspiration pneumonia: Status post modified barium swallow.  Cortrak tube in place and patient has been seen by speech therapy at intervals  and recommend puree with nursing supervision but still with cortrak.     Had repeat CXR on 1/13 without active disease.  Continue aspiration precautions.  Patient does have a chronic dysphagia and aspiration risk as per speech therapist.  Difficulty maintaining nutrition on cortak.  Palliative care to discuss with the family today.  Severe protein calorie malnutrition: Present on admission.   Currently on cortrak tube feeding.   Nutrition on board.  Patient has high aspiration risk and is unable to meet nutritional demands unless he has long term plans for enteral feeding.  Patient's mother unable to make a decision  Palliative care has been consulted to discuss about goals of care and nutritional issues. To have family meeting by palliative care today.  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  Creatinine of 4.8.  Baseline creatinine around 5.  No plans for HD  as per nephrology.    Hyperkalemia.  Mild, 5.5 today.  Tube feedings have been changed. Nephrology on board. Will need to closely monitor.  Hypernatremia: Improved.  Continue water flushes.  Check BMP closely.  Sodium of 137 today  Anemia of critical illness and chronic disease: We will continue to monitor. Hemoglobin of 10.5. Transfuse if less than 7.  Hyperglycemia: Continue SSI q4hrs. latest POC glucose was 120  Heparin-induced thrombocytopenia. was on argatroban and switched to eliquis. Platelet count of 265 today.   Essential Hypertension: off medications. Closely monitor.  Right proximal humerus fracture status post reverse shoulder arthroplasty 1/5.  On Sling.  Will need outpatient orthopedic follow-up.  VTE Prophylaxis:  eliquis  Code Status: Full code  Family Communication:  None today.  Have been speaking with the patient's mother at different times. Palliative Care team to follow the family today and decide about further goals of care.  Disposition Plan:  Skilled nursing facility placement, pending improvement in nutrition/discussion of goals of care.  Patient has chronic dysphagia and is still on cortak tube. Long term feeding is a big issue as the patient  is severely malnourished and has huge aspiration risk. Palliative care to discuss about goals of care/feeding issues today.  Consultants:  Orthopedic surgery  Wound Care  Nephrology  PCCM  Palliative care  Procedures:  Intubation x 2  ORIF of right  shoulder  CorTrak tube placement  Antibiotics: Meropenem 1/11> completed 7 day course  Anti-infectives (From admission, onward)   Start     Dose/Rate Route Frequency Ordered Stop   10/04/19 1230  meropenem (MERREM) 500 mg in sodium chloride 0.9 % 100 mL IVPB     500 mg 200 mL/hr over 30 Minutes Intravenous Every 24 hours 10/04/19 1213 10/10/19 1231   10/04/19 1000  ceFEPIme (MAXIPIME) 1 g in sodium chloride 0.9 % 100 mL IVPB  Status:  Discontinued     1 g 200 mL/hr over 30 Minutes Intravenous Every 24 hours 10/03/19 0921 10/04/19 1213   10/03/19 0930  ceFEPIme (MAXIPIME) 1 g in sodium chloride 0.9 % 100 mL IVPB     1 g 200 mL/hr over 30 Minutes Intravenous STAT 10/03/19 0921 10/03/19 1100   10/03/19 0800  cefTAZidime (FORTAZ) 0.5 g in dextrose 5 % 50 mL IVPB  Status:  Discontinued     0.5 g 100 mL/hr over 30 Minutes Intravenous Every 24 hours 10/03/19 0705 10/03/19 0921   10/01/19 0800  Ampicillin-Sulbactam (UNASYN) 3 g in sodium chloride 0.9 % 100 mL IVPB  Status:  Discontinued     3 g 200 mL/hr over 30 Minutes Intravenous Every 12 hours 10/01/19 0640 10/03/19 0655   09/28/19 2030  ceFAZolin (ANCEF) IVPB 1 g/50 mL premix     1 g 100 mL/hr over 30 Minutes Intravenous Every 12 hours 09/28/19 1624 09/29/19 1910   09/28/19 1157  vancomycin (VANCOCIN) powder  Status:  Discontinued       As needed 09/28/19 1158 09/28/19 1457   09/28/19 0600  vancomycin (VANCOCIN) IVPB 1000 mg/200 mL premix  Status:  Discontinued     1,000 mg 200 mL/hr over 60 Minutes Intravenous On call to O.R. 09/27/19 1159 09/28/19 1607   09/28/19 0000  ceFAZolin (ANCEF) IVPB 2g/100 mL premix    Note to Pharmacy: Surgery delayed until 1/5.   2 g 200 mL/hr over 30 Minutes Intravenous To Surgery 09/22/19 0904 09/28/19 1222   09/22/19 0815  ceFAZolin (ANCEF) IVPB 2g/100 mL premix  Status:  Discontinued     2 g 200 mL/hr over 30 Minutes Intravenous To Surgery 09/22/19 0806 09/22/19 0905   09/15/19 1715  azithromycin  (ZITHROMAX) 500 mg in sodium chloride 0.9 % 250 mL IVPB  Status:  Discontinued     500 mg 250 mL/hr over 60 Minutes Intravenous Every 24 hours 09/15/19 1711 09/18/19 1107   09/15/19 1715  cefTRIAXone (ROCEPHIN) 1 g in sodium chloride 0.9 % 100 mL IVPB     1 g 200 mL/hr over 30 Minutes Intravenous Every 24 hours 09/15/19 1711 09/21/19 1754     Subjective: Today, patient denies interval complaints.  Feels okay.  Denies shortness of breath cough fever.   Objective: Vitals:   10/12/19 0316 10/12/19 0741  BP: 121/66 (!) 149/64  Pulse: 63 68  Resp: 18 16  Temp: 97.7 F (36.5 C) 97.7 F (36.5 C)  SpO2: 94% 98%    Intake/Output Summary (Last 24 hours) at 10/12/2019 0742 Last data filed at 10/12/2019 0005 Gross per 24 hour  Intake --  Output 2400 ml  Net -2400 ml   Filed Weights   10/09/19 0416 10/11/19 0358  10/12/19 0500  Weight: 70.1 kg 69 kg 67.9 kg   Body mass index is 22.76 kg/m.   Physical Exam:  General: Thinly built, not in obvious distress.  Answering yes or no questions.  Deconditioned and chronically ill. HENT: Normocephalic, pupils equally reacting to light and accommodation.  No scleral pallor or icterus noted. Oral mucosa is moist.  Cortrak tube in place. Chest: Decreased breath sounds bilaterally.  Coarse breath sounds noted. CVS: S1 &S2 heard. No murmur.  Regular rate and rhythm. Abdomen: Soft, nontender, nondistended.  Bowel sounds are heard.  Liver is not palpable, no abdominal mass palpated Extremities: No cyanosis, clubbing or edema.  Peripheral pulses are palpable.  Left shoulder with staples from recent surgery-site clean dry and intact.  Arm sling in place. Psych: Alert awake communicative. CNS: Moving all extremities but generalized weakness noted.  Alert awake and communicative Skin: Warm and dry.  Left shoulder incision site clean dry and intact  Data Review: I have personally reviewed the following laboratory data and studies,  CBC: Recent Labs   Lab 11-04-2019 0249 10/09/19 0315 10/10/19 0727 10/11/19 0240 10/12/19 0354  WBC 5.6 6.4 7.8 8.6 7.4  HGB 9.1* 9.7* 10.7* 10.6* 10.5*  HCT 29.0* 30.8* 33.5* 33.5* 34.1*  MCV 96.0 96.0 96.3 96.5 100.6*  PLT 154 184 248 262 161   Basic Metabolic Panel: Recent Labs  Lab 2019-11-04 0249 10/09/19 0315 10/10/19 0727 10/11/19 0240 10/12/19 0354  NA 140 138 136 137 137  K 4.9 5.6* 5.1 5.4* 5.5*  CL 110 108 106 106 108  CO2 20* 21* 22 22 20*  GLUCOSE 162* 145* 108* 116* 123*  BUN 72* 69* 66* 67* 71*  CREATININE 4.95* 4.71* 4.63* 4.59* 4.84*  CALCIUM 7.4* 7.6* 8.3* 8.3* 7.9*  MG 2.0  --   --   --   --   PHOS 4.8* 5.2* 5.2* 5.7* 6.3*   Liver Function Tests: Recent Labs  Lab November 04, 2019 0249 10/09/19 0315 10/10/19 0727 10/11/19 0240 10/12/19 0354  ALBUMIN 1.6* 1.7* 1.9* 1.9* 1.9*   No results for input(s): LIPASE, AMYLASE in the last 168 hours. No results for input(s): AMMONIA in the last 168 hours. Cardiac Enzymes: No results for input(s): CKTOTAL, CKMB, CKMBINDEX, TROPONINI in the last 168 hours. BNP (last 3 results) Recent Labs    09/15/19 1822  BNP 61.3    ProBNP (last 3 results) No results for input(s): PROBNP in the last 8760 hours.  CBG: Recent Labs  Lab 10/11/19 1159 10/11/19 1615 10/11/19 1948 10/12/19 0002 10/12/19 0314  GLUCAP 125* 94 120* 118* 120*   No results found for this or any previous visit (from the past 240 hour(s)).   Studies: No results found.  Scheduled Meds: . apixaban  5 mg Per Tube BID  . chlorhexidine gluconate (MEDLINE KIT)  15 mL Mouth Rinse BID  . Chlorhexidine Gluconate Cloth  6 each Topical Daily  . darbepoetin (ARANESP) injection - NON-DIALYSIS  150 mcg Subcutaneous Q Thu-1800  . docusate  100 mg Per Tube BID  . feeding supplement (PRO-STAT SUGAR FREE 64)  30 mL Per Tube Daily  . free water  300 mL Per Tube Q4H  . insulin aspart  0-9 Units Subcutaneous Q4H  . mouth rinse  15 mL Mouth Rinse q12n4p  . pantoprazole sodium   40 mg Per Tube Daily  . pneumococcal 23 valent vaccine  0.5 mL Intramuscular Tomorrow-1000  . polyethylene glycol  17 g Per Tube BID    Continuous Infusions: .  sodium chloride Stopped (10/05/19 2000)  . feeding supplement (NEPRO CARB STEADY) 1,000 mL (10/10/19 1342)    Flora Lipps, MD  [facility] 10/12/2019

## 2019-10-12 NOTE — Consult Note (Signed)
Consultation Note Date: 10/12/2019   Patient Name: Ryan Villa  DOB: [birth year]  MRN: [id number]  Age / Sex: 64 y.o., male   PCP: Maryella Shivers, MD Referring Physician: Flora Lipps, MD   REASON FOR CONSULTATION:Establishing goals of care  Palliative Care consult requested for goals of care in this 64 y.o. male with multiple medical problems including CKD stage IV, diabetes mellitus type 2, hyperlipidemia, hypertension, left carotid stenosis, intracerebral hemorrhage, and status post right BKA. Mr. Orlick originally presented to [facility] for acute respiratory failure, rhabdomyolysis, and acute kidney injury. He was transferred to [facility] for a higher level of care. During his ED work-up at [facility] patient appeared hypoxic on room air requiring nonrebreather. Hypothermic 93.9, with right shoulder ecchymosis. sodium 149, potassium 6.8, BUN 172, creatinine 10.5.  CK 2411. Procalcitonin 0.61. Since admission he required intubated and self extubated on 12/25.  S/p right shoulder reverse arthroplasty 1/5.1/8 had vomiting with probable aspiration requiring re-intubation 10/01/2019.He was weaned to extubation on 10/04/2019. He has been evaluated by SLP and now has Cortrak in place for feeding. Evaluations noted severe dysphagia high risk for aspiration.   Clinical Assessment and Goals of Care: I have reviewed medical records including lab results, imaging, Epic notes, and MAR, received report from the bedside RN, and assessed the patient. I met at the bedside with patient's mother Bransyn Adami and brother Jahn Franchini to discuss diagnosis prognosis, [place], EOL wishes, disposition and options. Daisean is much more awake today. A&O x3. Denies pain or shortness of breath. Although he answered appropriately he was not attentive during discussion. Was focused on the tv and turning it back on. TV turned on and muted however he continued to request to turn volume back on. He did not engage much in  discussion.   I introduced Palliative Medicine as specialized medical care for people living with serious illness. It focuses on providing relief from the symptoms and stress of a serious illness. The goal is to improve quality of life for both the patient and the family. Family verbalized understanding.   We discussed a brief life review of the patient, along with his functional and nutritional status. 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 He is divorced with no children. He lives with his 29 year old mother with brothers support.   Family reports prior to admission patient was independent of ADLs with use of right leg prosthesis. They report noticeable weakness over the past 2-3 weeks and decreased appetite. Mother reports patient fell prior to admission due to prosthesis not being secured.   We discussed His current illness and what it means in the larger context of His on-going co-morbidities. With specific discussions regarding his CKD, and overall poor functional and nutritional state. Natural disease trajectory and expectations at EOL were discussed.  Detailed discussions regarding renal failure, dysphagia, aspiration risk, and concerns for patient's ability to maintain nutritional requirement. Family verbalizes understanding. Mother reports she is hopeful patient will show some improvement in appetite with understanding of risk. I educated family on risk of continued aspiration including worsening health (pneumonia) and death. Family states they would like to continue with Coretrak and hopeful patient will tolerate oral nutrition by mouth. They are aware of aspiration risk. We discussed PEG and pros and cons. Family reports they do not feel patient would do well with a PEG and fears for dislodgement. They state they would not like to pursue long-term/PEG  at this time. I again emphasized concerns with nutrition long-term and awareness of future  decline. Family verbalized understanding.   We also discussed patients renal failure. We discussed patient's overall condition and functional state. Family understands renal failure and dialysis. Family expresses understanding he is not a good candidate due to functional and co-morbidities. They are in agreement with not proceeding with dialysis at this point or if needed. They again express hopes that he will continue to be stable from a renal standpoint.   I attempted to elicit values and goals of care important to the patient.    The difference between aggressive medical intervention and comfort care was considered in light of the patient's goals of care. Family has verbalized their goals is to continue with current plan. They are not interested in PEG placement at this time. They are hopeful patient will show improvement, however also with awareness this may not occur. They wish to allow feedings with awareness of dysphagia and high risk for aspiration and complications. They are willing to accept this risk and take necessary precautions and follow recommendations to attempt to reduce his risk.   Patient does not have a documented advanced directive. I discussed with family patient full code status with consideration of his current illness and co-morbidities. I explained what a code situation could look like for patient. Mother mentions patient being intubated and extubated. Family request for patient to remain a full code at this time. They expressed they had not thought about DNR as extensively as we had discussed and would like to think on it further and discuss amongst themselves.    I used this opportunity to also discuss with family to consider the care needs of patient as his health advances and/or decline. I am concerned that he lives with his 41 year old mother who would not be able to provide total care needs or extensive support to patient. Brother acknowledges he cannot be involved in care  full-time.   Hospice and Palliative Care services outpatient were explained and offered. Patient and family verbalized their understanding and awareness of both palliative and hospice's goals and philosophy of care. Family is not interested in hospice at this time although he is appropriate for outpatient hospice support given his poor prognosis. They expressed they would be interested in outpatient palliative initially.   Questions and concerns were addressed.  Hard Choices booklet left for review. The family was encouraged to call with questions or concerns.  PMT will continue to support holistically.   SOCIAL HISTORY:       CODE STATUS: Full code  ADVANCE DIRECTIVES: Mother and brother   SYMPTOM MANAGEMENT: per attending   Palliative Prophylaxis:   Aspiration, Frequent Pain Assessment and Oral Care  PSYCHO-SOCIAL/SPIRITUAL:  Support System: Family   Desire for further Chaplaincy support:NO   Additional Recommendations (Limitations, Scope, Preferences):  Full Scope Treatment and No Artificial Feeding   PAST MEDICAL HISTORY:History reviewed. No pertinent past medical history.  PAST SURGICAL HISTORY:  Past Surgical History:  Procedure Laterality Date  . REVERSE SHOULDER ARTHROPLASTY Right 09/28/2019   Procedure: RIGHT REVERSE SHOULDER ARTHROPLASTY;  Surgeon: Nicholes Stairs, MD;  Location: [facility];  Service: Orthopedics;  Laterality: Right;    ALLERGIES:  is allergic to heparin.   MEDICATIONS:  Current Facility-Administered Medications  Medication Dose Route Frequency Provider Last Rate Last Admin  . 0.9 %  sodium chloride infusion  250 mL Intravenous Continuous Jacalyn Lefevre, MD   Stopped at 10/05/19 2000  .  acetaminophen (TYLENOL) tablet 650 mg  650 mg Per Tube Q4H PRN Nicholes Stairs, MD      . apixaban Arne Cleveland) tablet 5 mg  5 mg Per Tube BID Pokhrel, Laxman, MD   5 mg at 10/12/19 0856  . bisacodyl (DULCOLAX) suppository 10 mg  10 mg Rectal Daily PRN  Nicholes Stairs, MD   10 mg at 10/02/19 1530  . chlorhexidine gluconate (MEDLINE KIT) (PERIDEX) 0.12 % solution 15 mL  15 mL Mouth Rinse BID Anders Simmonds, MD   15 mL at 10/12/19 0856  . Chlorhexidine Gluconate Cloth 2 % PADS 6 each  6 each Topical Daily Nicholes Stairs, MD   6 each at 10/12/19 [id number]  . Darbepoetin Alfa (ARANESP) injection 150 mcg  150 mcg Subcutaneous Q Thu-1800 Justin Mend, MD   150 mcg at 10/07/19 1832  . docusate (COLACE) 50 MG/5ML liquid 100 mg  100 mg Per Tube BID Kipp Brood, MD   100 mg at 10/12/19 0855  . feeding supplement (NEPRO CARB STEADY) liquid 1,000 mL  1,000 mL Per Tube Continuous Pokhrel, Laxman, MD 50 mL/hr at 10/10/19 1342 1,000 mL at 10/10/19 1342  . feeding supplement (PRO-STAT SUGAR FREE 64) liquid 30 mL  30 mL Per Tube Daily Pokhrel, Laxman, MD   30 mL at 10/12/19 0902  . free water 300 mL  300 mL Per Tube Q4H Corliss Parish, MD   300 mL at 10/12/19 0855  . hydrALAZINE (APRESOLINE) injection 10 mg  10 mg Intravenous Q4H PRN Nicholes Stairs, MD   10 mg at 10/05/19 1729  . insulin aspart (novoLOG) injection 0-9 Units  0-9 Units Subcutaneous Q4H Desai, Rahul P, PA-C   1 Units at 10/12/19 0854  . ipratropium-albuterol (DUONEB) 0.5-2.5 (3) MG/3ML nebulizer solution 3 mL  3 mL Nebulization Q4H PRN Bodenheimer, Charles A, NP      . MEDLINE mouth rinse  15 mL Mouth Rinse q12n4p Rush Farmer, MD   15 mL at 10/10/19 1608  . ondansetron (ZOFRAN) injection 4 mg  4 mg Intravenous Q6H PRN Nicholes Stairs, MD   4 mg at 10/01/19 0426  . pantoprazole sodium (PROTONIX) 40 mg/20 mL oral suspension 40 mg  40 mg Per Tube Daily Kipp Brood, MD   40 mg at 10/12/19 0855  . pneumococcal 23 valent vaccine (PNEUMOVAX-23) injection 0.5 mL  0.5 mL Intramuscular Tomorrow-1000 Rush Farmer, MD      . polyethylene glycol (MIRALAX / GLYCOLAX) packet 17 g  17 g Per Tube BID Kipp Brood, MD   17 g at 10/11/19 2203  . Resource ThickenUp Clear    Oral PRN Pokhrel, Corrie Mckusick, MD        VITAL SIGNS: BP (!) 149/64 (BP Location: Left Wrist)   Pulse 68   Temp 97.7 F (36.5 C) (Oral)   Resp 16   Ht '5\' 8"'  (1.727 m)   Wt 67.9 kg   SpO2 98%   BMI 22.76 kg/m  Filed Weights   10/09/19 0416 10/11/19 0358 10/12/19 0500  Weight: 70.1 kg 69 kg 67.9 kg    Estimated body mass index is 22.76 kg/m as calculated from the following:   Height as of this encounter: '5\' 8"'  (1.727 m).   Weight as of this encounter: 67.9 kg.  LABS: CBC:    Component Value Date/Time   WBC 7.4 10/12/2019 0354   HGB 10.5 (L) 10/12/2019 0354   HCT 34.1 (L) 10/12/2019 0354   PLT 265  10/12/2019 0354   Comprehensive Metabolic Panel:    Component Value Date/Time   NA 137 10/12/2019 0354   K 5.5 (H) 10/12/2019 0354   CO2 20 (L) 10/12/2019 0354   BUN 71 (H) 10/12/2019 0354   CREATININE 4.84 (H) 10/12/2019 0354   ALBUMIN 1.9 (L) 10/12/2019 0354     Review of Systems  Constitutional: Positive for appetite change and unexpected weight change.  Neurological: Positive for weakness.  Unless otherwise noted, a complete review of systems is negative.  Physical Exam General: NAD, frail chronically-ill appearing, cachectic  Cardiovascular: regular rate and rhythm Pulmonary: diminished bilaterally Abdomen: soft, nontender, + bowel sounds Extremities: no edema, L shoulder sling in place, staples intact Skin: no rashes, warm, dry Neurological: much more awake, A&O x3, inattentive   Prognosis: Guarded to Poor in the setting of severe protein calorie malnutrition, cachectic, dysphagia, respiratory failure, CKD 5, and deconditioned. Patient will be guarded regarding long-term nutrition and high risk for aspiration.   Discharge Planning:  To Be Determined  Recommendations:  Full Code/Full scope at family's request  Continue with current plan of care  Family is not interested in PEG at this time. They remain hopeful patient will tolerate oral nutrition and follow  SLP recommendations with support to reduce risk of aspiration. Detailed education and discussion regarding long-term feeding complications, dysphagia, and high risk of aspiration.   Family not interested in HD if needed.   Explained to family based on goals patient is most appropriate for hospice however, they are not ready to accept this. They would like to continue with other medical interventions. Requesting outpatient palliative support.   PMT will continue to support and follow as needed.   Palliative Performance Scale: Coretrak              Family expressed understanding and was in agreement with this plan.   Thank you for allowing the Palliative Medicine Team to assist in the care of this patient.  Time In: 1100 Time Out: 1210 Time Total: 70 min.   Visit consisted of counseling and education dealing with the complex and emotionally intense issues of symptom management and palliative care in the setting of serious and potentially life-threatening illness.Greater than 50%  of this time was spent counseling and coordinating care related to the above assessment and plan.  Signed by:  Alda Lea, AGPCNP-BC Palliative Medicine Team  Phone: [phone] Fax: [phone] Pager: [phone] Amion: Bjorn Pippin

## 2019-10-13 LAB — GLUCOSE, CAPILLARY
Glucose-Capillary: 106 mg/dL — ABNORMAL HIGH (ref 70–99)
Glucose-Capillary: 104 mg/dL — ABNORMAL HIGH (ref 70–99)
Glucose-Capillary: 112 mg/dL — ABNORMAL HIGH (ref 70–99)
Glucose-Capillary: 105 mg/dL — ABNORMAL HIGH (ref 70–99)
Glucose-Capillary: 116 mg/dL — ABNORMAL HIGH (ref 70–99)
Glucose-Capillary: 112 mg/dL — ABNORMAL HIGH (ref 70–99)

## 2019-10-13 LAB — RENAL FUNCTION PANEL
Albumin: 1.9 g/dL — ABNORMAL LOW (ref 3.5–5.0)
Anion gap: 9 (ref 5–15)
BUN: 73 mg/dL — ABNORMAL HIGH (ref 8–23)
CO2: 22 mmol/L (ref 22–32)
Calcium: 7.9 mg/dL — ABNORMAL LOW (ref 8.9–10.3)
Chloride: 105 mmol/L (ref 98–111)
Creatinine, Ser: 5.04 mg/dL — ABNORMAL HIGH (ref 0.61–1.24)
GFR calc Af Amer: 13 mL/min — ABNORMAL LOW (ref >60)
GFR calc non Af Amer: 11 mL/min — ABNORMAL LOW (ref >60)
Glucose, Bld: 110 mg/dL — ABNORMAL HIGH (ref 70–99)
Phosphorus: 6.3 mg/dL — ABNORMAL HIGH (ref 2.5–4.6)
Potassium: 5.5 mmol/L — ABNORMAL HIGH (ref 3.5–5.1)
Sodium: 136 mmol/L (ref 135–145)

## 2019-10-13 LAB — CBC
HCT: 33.3 % — ABNORMAL LOW (ref 39.0–52.0)
Hemoglobin: 10.3 g/dL — ABNORMAL LOW (ref 13.0–17.0)
MCH: 30.8 pg (ref 26.0–34.0)
MCHC: 30.9 g/dL (ref 30.0–36.0)
MCV: 99.7 fL (ref 80.0–100.0)
Platelets: 309 10*3/uL (ref 150–400)
RBC: 3.34 MIL/uL — ABNORMAL LOW (ref 4.22–5.81)
RDW: 16.1 % — ABNORMAL HIGH (ref 11.5–15.5)
WBC: 7.9 10*3/uL (ref 4.0–10.5)
nRBC: 0 % (ref 0.0–0.2)

## 2019-10-13 LAB — APTT: aPTT: 36 seconds (ref 24–36)

## 2019-10-13 NOTE — Progress Notes (Signed)
  Speech Language Pathology Treatment: Dysphagia  Patient Details Name: Ryan Villa MRN: [id number] DOB: 07-14-1956 Today's Date: 10/13/2019 Time: 1545-1600 SLP Time Calculation (min) (ACUTE ONLY): 15 min  Assessment / Plan / Recommendation Clinical Impression  Pt seen at bedside for limited session. Oral care was completed with suction, which pt tolerated well. While waiting for assistance with repositioning, SLP provided education regarding severity of dysphagia, po trials, and importance of adherence to safe swallow precautions. Upon arrival of nursing for repositioning, pt was noted to have removed condom cath, and his gown and bedding were significantly wet. Nursing now providing pt care at this time. SLP will continue efforts.   HPI HPI: 64 year old male transferred from [facility] for acute respiratory failure, rhabdomyolysis (fell and laid on floor 3 days), acute kidney injury. PMH: CKD stage IV, diabetes mellitus type 2, hyperlipidemia, hypertension, left carotid stenosis, intracerebral hemorrhage, status post right BKA. Per chart EMS reported that the patient's sister was bringing him food while he was lying on the floor during that 3-day period. Intubated 12/23-12/24 (self extubated). Found to have right humeral neck fracture and right upper lobe consolidation per chest x-ray, head CT no acute intracranial process,   Pt underwent MBS 12/28 with findings of severe dysphagia - asp of nectar/honey thick.  Repeat MBS 1/2 recommended Dys 1 diet and nectar thick liquids with chin tuck. On 1/8 pt had vomiting and suspected aspiration requiring reintubation; extuabted 1/11.      SLP Plan  Continue with current plan of care       Recommendations  Diet recommendations: NPO Medication Administration: Via alternative means                Oral Care Recommendations: Oral care QID Follow up Recommendations: Skilled Nursing facility SLP Visit Diagnosis: Dysphagia, oropharyngeal phase  (R13.12) Plan: Continue with current plan of care       GO              Ryan Lindon B. Ryan Villa, [facility], [place] Speech Language Pathologist Office: [phone] Pager: [phone]  Ryan Villa 10/13/2019, 4:06 PM

## 2019-10-13 NOTE — Progress Notes (Signed)
PROGRESS NOTE  Ryan Villa TRR:[id number] DOB: [birth year] DOA: 09/15/2019 PCP: Maryella Shivers, MD   LOS: 28 days   Brief narrative:  64 year old male was transferred from [facility] 12/23 for acute respiratory failure, rhabdomyolysis, acute kidney injury due to services unavailable at that facility.  He was in the ICU intubated and self extubated on 12/25. Transferred out of ICU 12/27. The patient was taken for right shoulder reverse arthroplasty 1/5. Early AM, 1/8 had vomiting with probable aspiration. Placed on NRB and transferred to ICU for intubation 10/01/2019. He was weaned to extubation on 10/04/2019. He has been evaluated by SLP and has a Cortrak tube in place.  Family has met with palliative care and is hopeful that he will be able to eat  Assessment/Plan:  Principal Problem:   Closed fracture of right proximal humerus Active Problems:   Acute respiratory failure (HCC)   AKI (acute kidney injury) ([place])   Rhabdomyolysis   Acute renal failure (ARF) (HCC)   Pressure injury of skin   Protein-calorie malnutrition, severe   Encounter for feeding tube placement   Aspiration into airway   Fracture  Acute hypoxic respiratory failure from Pseudomonal HCAP. resolved. Extubated on 10/04/2019 and was transferred out of the ICU.  Completed IV meropenem x 7 day course. Speech therapy on board and is on cortak tube feeding.  Continue flutter valve.  Currently saturating 98% on room air. -aspiration precuations   Dysphagia and vomiting leading to aspiration pneumonia: Status post modified barium swallow.  Cortrak tube in place and patient has been seen by speech therapy at intervals  and recommend puree with nursing supervision but still with cortrak.     Had repeat CXR on 1/13 without active disease.   -Continue aspiration precautions.  - Patient does have a chronic dysphagia and aspiration risk as per speech therapist.  Difficulty maintaining nutrition on cortak.   -Palliative care  discussed with the family today-- not wanting PEG, hopeful he will eat-- will need to revisit   Severe protein calorie malnutrition: Present on admission.  Currently on cortrak tube feeding.   Nutrition on board.  Patient has high aspiration risk and is unable to meet nutritional demands unless he has long term plans for enteral feeding.  Patient's mother unable to make a decision (?dementia) - Palliative care has met with family-- not ready for hospice yet  CKD 5: - Baseline creatinine around 5.   -No plans for HD  as per nephrology (signed off 1/15)   Hyperkalemia.   -high but flat -daily labs  Hypernatremia:  -Improved.  Continue water flushes.    Anemia of critical illness and chronic disease:  -monitor -hold on aranesp as hgb >10  Hyperglycemia:  -SSI  Heparin-induced thrombocytopenia.  -was on argatroban and switched to eliquis. .   Essential Hypertension: off medications. Closely monitor.  Right proximal humerus fracture status post reverse shoulder arthroplasty 1/5.   in Sling.     VTE Prophylaxis: eliquis  Code Status: Full code  Family Communication:  Family met with palliative care  Disposition Plan:  Skilled nursing facility placement, pending improvement in nutrition  Patient has chronic dysphagia and is still on cortak tube. Long term feeding is a big issue as the patient is severely malnourished and has huge aspiration risk.  Palliative care discussed with family but not ready to make hospice yet  Consultants:  Orthopedic surgery  Wound Care  Nephrology  PCCM  Palliative care  Procedures:  Intubation x 2  ORIF of  right shoulder  CorTrak tube placement  Antibiotics: Meropenem 1/11> completed 7 day course  Anti-infectives (From admission, onward)   Start     Dose/Rate Route Frequency Ordered Stop   10/04/19 1230  meropenem (MERREM) 500 mg in sodium chloride 0.9 % 100 mL IVPB     500 mg 200 mL/hr over 30 Minutes Intravenous Every 24  hours 10/04/19 1213 10/10/19 1231   10/04/19 1000  ceFEPIme (MAXIPIME) 1 g in sodium chloride 0.9 % 100 mL IVPB  Status:  Discontinued     1 g 200 mL/hr over 30 Minutes Intravenous Every 24 hours 10/03/19 0921 10/04/19 1213   10/03/19 0930  ceFEPIme (MAXIPIME) 1 g in sodium chloride 0.9 % 100 mL IVPB     1 g 200 mL/hr over 30 Minutes Intravenous STAT 10/03/19 0921 10/03/19 1100   10/03/19 0800  cefTAZidime (FORTAZ) 0.5 g in dextrose 5 % 50 mL IVPB  Status:  Discontinued     0.5 g 100 mL/hr over 30 Minutes Intravenous Every 24 hours 10/03/19 0705 10/03/19 0921   10/01/19 0800  Ampicillin-Sulbactam (UNASYN) 3 g in sodium chloride 0.9 % 100 mL IVPB  Status:  Discontinued     3 g 200 mL/hr over 30 Minutes Intravenous Every 12 hours 10/01/19 0640 10/03/19 0655   09/28/19 2030  ceFAZolin (ANCEF) IVPB 1 g/50 mL premix     1 g 100 mL/hr over 30 Minutes Intravenous Every 12 hours 09/28/19 1624 09/29/19 1910   09/28/19 1157  vancomycin (VANCOCIN) powder  Status:  Discontinued       As needed 09/28/19 1158 09/28/19 1457   09/28/19 0600  vancomycin (VANCOCIN) IVPB 1000 mg/200 mL premix  Status:  Discontinued     1,000 mg 200 mL/hr over 60 Minutes Intravenous On call to O.R. 09/27/19 1159 09/28/19 1607   09/28/19 0000  ceFAZolin (ANCEF) IVPB 2g/100 mL premix    Note to Pharmacy: Surgery delayed until 1/5.   2 g 200 mL/hr over 30 Minutes Intravenous To Surgery 09/22/19 0904 09/28/19 1222   09/22/19 0815  ceFAZolin (ANCEF) IVPB 2g/100 mL premix  Status:  Discontinued     2 g 200 mL/hr over 30 Minutes Intravenous To Surgery 09/22/19 0806 09/22/19 0905   09/15/19 1715  azithromycin (ZITHROMAX) 500 mg in sodium chloride 0.9 % 250 mL IVPB  Status:  Discontinued     500 mg 250 mL/hr over 60 Minutes Intravenous Every 24 hours 09/15/19 1711 09/18/19 1107   09/15/19 1715  cefTRIAXone (ROCEPHIN) 1 g in sodium chloride 0.9 % 100 mL IVPB     1 g 200 mL/hr over 30 Minutes Intravenous Every 24 hours 09/15/19  1711 09/21/19 1754     Subjective: In bed, no complaints   Objective: Vitals:   10/13/19 0736 10/13/19 1303  BP: (!) 132/59 (!) 163/77  Pulse: 66 70  Resp: 16 20  Temp: 98.4 F (36.9 C) 98.1 F (36.7 C)  SpO2: 94% 100%    Intake/Output Summary (Last 24 hours) at 10/13/2019 1326 Last data filed at 10/13/2019 0324 Gross per 24 hour  Intake -  Output 1850 ml  Net -1850 ml   Filed Weights   10/11/19 0358 10/12/19 0500 10/13/19 0324  Weight: 69 kg 67.9 kg 67.5 kg   Body mass index is 22.63 kg/m.   Physical Exam:  Chronically ill appearing cortrak in place rrr No increased work of breathing   Data Review: I have personally reviewed the following laboratory data and studies,  CBC: Recent  Labs  Lab 10/09/19 0315 10/10/19 0727 10/11/19 0240 10/12/19 0354 10/13/19 0214  WBC 6.4 7.8 8.6 7.4 7.9  HGB 9.7* 10.7* 10.6* 10.5* 10.3*  HCT 30.8* 33.5* 33.5* 34.1* 33.3*  MCV 96.0 96.3 96.5 100.6* 99.7  PLT 184 248 262 265 672   Basic Metabolic Panel: Recent Labs  Lab 10/08/19 0249 10/08/19 0249 10/09/19 0315 10/10/19 0727 10/11/19 0240 10/12/19 0354 10/13/19 0214  NA 140   < > 138 136 137 137 136  K 4.9   < > 5.6* 5.1 5.4* 5.5* 5.5*  CL 110   < > 108 106 106 108 105  CO2 20*   < > 21* 22 22 20* 22  GLUCOSE 162*   < > 145* 108* 116* 123* 110*  BUN 72*   < > 69* 66* 67* 71* 73*  CREATININE 4.95*   < > 4.71* 4.63* 4.59* 4.84* 5.04*  CALCIUM 7.4*   < > 7.6* 8.3* 8.3* 7.9* 7.9*  MG 2.0  --   --   --   --   --   --   PHOS 4.8*   < > 5.2* 5.2* 5.7* 6.3* 6.3*   < > = values in this interval not displayed.   Liver Function Tests: Recent Labs  Lab 10/09/19 0315 10/10/19 0727 10/11/19 0240 10/12/19 0354 10/13/19 0214  ALBUMIN 1.7* 1.9* 1.9* 1.9* 1.9*   No results for input(s): LIPASE, AMYLASE in the last 168 hours. No results for input(s): AMMONIA in the last 168 hours. Cardiac Enzymes: No results for input(s): CKTOTAL, CKMB, CKMBINDEX, TROPONINI in the  last 168 hours. BNP (last 3 results) Recent Labs    09/15/19 1822  BNP 61.3    ProBNP (last 3 results) No results for input(s): PROBNP in the last 8760 hours.  CBG: Recent Labs  Lab 10/12/19 1936 10/12/19 2331 10/13/19 0320 10/13/19 0741 10/13/19 1309  GLUCAP 102* 106* 106* 104* 112*   No results found for this or any previous visit (from the past 240 hour(s)).   Studies: No results found.  Scheduled Meds: . apixaban  5 mg Per Tube BID  . chlorhexidine gluconate (MEDLINE KIT)  15 mL Mouth Rinse BID  . Chlorhexidine Gluconate Cloth  6 each Topical Daily  . docusate  100 mg Per Tube BID  . feeding supplement (PRO-STAT SUGAR FREE 64)  30 mL Per Tube Daily  . free water  300 mL Per Tube Q4H  . insulin aspart  0-9 Units Subcutaneous Q4H  . mouth rinse  15 mL Mouth Rinse q12n4p  . pantoprazole sodium  40 mg Per Tube Daily  . pneumococcal 23 valent vaccine  0.5 mL Intramuscular Tomorrow-1000  . polyethylene glycol  17 g Per Tube BID    Continuous Infusions: . sodium chloride Stopped (10/05/19 2000)  . feeding supplement (NEPRO CARB STEADY) 1,000 mL (10/12/19 1821)    Geradine Girt, DO  [facility] 10/13/2019

## 2019-10-13 NOTE — Progress Notes (Signed)
Physical Therapy Treatment Patient Details Name: Ryan Villa MRN: [id number] DOB: 10/11/1955 Today's Date: 10/13/2019    History of Present Illness Pt adm with rt humeral fx after fall at home and in the floor x 3 days. Pt intubated 12/23 and self extubated 12/25. Pt with acute metabolic encephalopathy and delirium, PMH -R BKA, ckd, dm, htn, intracerebral hemorrhage. Pt now s/p R TSA 1/5. Pt re-intubated 1/8 due to suspected aspiration PNA.    PT Comments    Pt supine in bed surfing channels with television remote.  He is responsive and alert.  Pt able to follow one step commands.  He was able to progress to standing and short gt trial of steps from bed to recliner.  He presents with ill fitting prosthesis that appears too loose.  Continue to recommend SNF placement at this time.    Follow Up Recommendations  SNF     Equipment Recommendations  None recommended by PT    Recommendations for Other Services       Precautions / Restrictions Precautions Precautions: Fall;Shoulder Type of Shoulder Precautions: okay to WB with transfers, otherwise no lifting; sling on when not ambulating or doing BADLs, okay for pendulums; no abduction; ER: 30 degrees; no AROM; okay hand wrist and elbow AROM Shoulder Interventions: Shoulder sling/immobilizer;For comfort Precaution Comments: R humerus ORIF 1/5, RUE flaccid on 1/15 Required Braces or Orthoses: Sling Restrictions Weight Bearing Restrictions: No Other Position/Activity Restrictions: able to WB with RW with transfers     Mobility  Bed Mobility Overal bed mobility: Needs Assistance       Supine to sit: Mod assist     General bed mobility comments: Pt able to follow commands to advance LEs to edge of, but required assistance to elevate trunk and scoot hips to edge of bed.  Once in sitting poor balance noted.  Transfers Overall transfer level: Needs assistance Equipment used: None(FACE to FACE stand pivot.) Transfers: Sit to/from  [facility] Sit to Stand: Mod assist Stand pivot transfers: Mod assist       General transfer comment: Pt able to stand and advance steps from bed to recliner chair.  Poor fitting prostesis noted so further gt deferred.  Ambulation/Gait Ambulation/Gait assistance: Mod assist Gait Distance (Feet): 4 Feet Assistive device: None Gait Pattern/deviations: Narrow base of support     General Gait Details: Steps from bed to recliner.   Stairs             Wheelchair Mobility    Modified Rankin (Stroke Patients Only)       Balance Overall balance assessment: Needs assistance   Sitting balance-Leahy Scale: Poor Sitting balance - Comments: use of LUE to maintain sitting balance.     Standing balance-Leahy Scale: Poor Standing balance comment: External assistance to remain standing.                            Cognition Arousal/Alertness: Awake/alert Behavior During Therapy: Flat affect Overall Cognitive Status: Within Functional Limits for tasks assessed(cognition not formally assessed but able to follow commands to perform functional tasks.)                                 General Comments: Pt limited due to distractions,  Attempting to use cardiac monitor to turn on television.      Exercises      General Comments  Pertinent Vitals/Pain Pain Assessment: No/denies pain(he denies pain when asked) Faces Pain Scale: No hurt    Home Living                      Prior Function            PT Goals (current goals can now be found in the care plan section) Acute Rehab PT Goals Patient Stated Goal: none stated Potential to Achieve Goals: Fair Progress towards PT goals: Progressing toward goals    Frequency    Min 2X/week      PT Plan Current plan remains appropriate    Co-evaluation              AM-PAC PT "6 Clicks" Mobility   Outcome Measure  Help needed turning from your back to your  side while in a flat bed without using bedrails?: A Lot Help needed moving from lying on your back to sitting on the side of a flat bed without using bedrails?: A Lot Help needed moving to and from a bed to a chair (including a wheelchair)?: A Lot Help needed standing up from a chair using your arms (e.g., wheelchair or bedside chair)?: A Lot Help needed to walk in hospital room?: A Lot Help needed climbing 3-5 steps with a railing? : A Lot 6 Click Score: 12    End of Session Equipment Utilized During Treatment: Gait belt Activity Tolerance: Patient tolerated treatment well Patient left: in chair;with call bell/phone within reach;with chair alarm set Nurse Communication: Mobility status PT Visit Diagnosis: Other abnormalities of gait and mobility (R26.89);Muscle weakness (generalized) (M62.81);Pain Pain - Right/Left: Right Pain - part of body: Shoulder     Time: [id number] PT Time Calculation (min) (ACUTE ONLY): 32 min  Charges:  $Therapeutic Activity: 23-37 mins                     Ryan Villa , PTA Acute Rehabilitation Services Pager [phone] Office [phone]     Meleni Delahunt Eli Hose 10/13/2019, 2:05 PM

## 2019-10-13 NOTE — Progress Notes (Signed)
Orthopedic Tech Progress Note Patient Details:  Ryan Villa [birth year] [id number]  Patient ID: Ryan Villa, male   DOB: 10-May-1956, 64 y.o.   MRN: [id number]  Called in  Order to hanger Karolee Stamps 10/13/2019, 2:44 PM

## 2019-10-13 NOTE — Progress Notes (Signed)
Prosthetic compression sleeve delivered

## 2019-10-13 NOTE — Progress Notes (Signed)
Daily Progress Note   Patient Name: Ryan Villa       Date: 10/13/2019 DOB: May 18, 1956  Age: 64 y.o. MRN#: [id number] Attending Physician: Geradine Girt, DO Primary Care Physician: Maryella Shivers, MD Admit Date: 09/15/2019  Reason for Consultation/Follow-up: Establishing goals of care  Subjective: Patient watching tv in bed. Denies pain or shortness of breath. He remembers me being at the bedside with his mother and brother however he does not recall the extent of the discussion.   I called and spoke with patient's brother via phone. Voicemail left with mother. I reviewed with brother our goals of care discussion on yesterday. He verbalized understanding and appreciation of discussion. He requested to include his wife on the phone call to provide him additional support with decision making. Introduced myself and role to Mrs. Jenne Campus. I provided updates and educated her in detail on patient's conditions and artificial feedings vs. Comfort.   Patient's brother expresses he and his mother was hopeful patient would eat and do ok from there. I explained concerns regarding his dysphagia and high risk of aspiration despite precautions. Family verbalized understanding. Brother expressed he is torn as he is unsure about long-term feeding tube placement or the level of care his brother would need. Wife agrees. Family requesting to continue watchful waiting period allowing them to further discuss today and hopefully come to a decision.   I discussed comfort care and allowing patient to continue to eat for comfort with awareness of aspiration risk and inability of receiving proper life-supporting nutrition. Family verbalized understanding and again requesting time to think things over.   All questions  answered.   Length of Stay: 28  Current Medications: Scheduled Meds:  . apixaban  5 mg Per Tube BID  . chlorhexidine gluconate (MEDLINE KIT)  15 mL Mouth Rinse BID  . Chlorhexidine Gluconate Cloth  6 each Topical Daily  . docusate  100 mg Per Tube BID  . feeding supplement (PRO-STAT SUGAR FREE 64)  30 mL Per Tube Daily  . free water  300 mL Per Tube Q4H  . insulin aspart  0-9 Units Subcutaneous Q4H  . mouth rinse  15 mL Mouth Rinse q12n4p  . pantoprazole sodium  40 mg Per Tube Daily  . pneumococcal 23 valent vaccine  0.5 mL Intramuscular Tomorrow-1000  . polyethylene glycol  17 g Per Tube BID    Continuous Infusions: . sodium chloride Stopped (10/05/19 2000)  . feeding supplement (NEPRO CARB STEADY) 1,000 mL (10/12/19 1821)    PRN Meds: acetaminophen, bisacodyl, hydrALAZINE, ipratropium-albuterol, [DISCONTINUED] ondansetron **OR** ondansetron (ZOFRAN) IV, Resource ThickenUp Clear  Physical Exam    -awake, alert, chronically-ill appearing, cachectic -RRR -diminished bilaterally       Vital Signs: BP (!) 163/77 (BP Location: Left Wrist)   Pulse 70   Temp 98.1 F (36.7 C) (Oral)   Resp 20   Ht '5\' 8"'  (1.727 m)   Wt 67.5 kg   SpO2 100%   BMI 22.63 kg/m  SpO2: SpO2: 100 % O2 Device: O2 Device: Room Air O2 Flow Rate: O2 Flow Rate (L/min): 4 L/min  Intake/output summary:   Intake/Output Summary (Last 24 hours) at 10/13/2019 1439 Last data filed at 10/13/2019 0324 Gross per 24 hour  Intake --  Output 1850 ml  Net -1850 ml   LBM: Last BM Date: 10/12/19 Baseline Weight: Weight: 68.5 kg Most recent weight: Weight: 67.5 kg       Palliative Assessment/Data: CORETRAK      Patient Active Problem List   Diagnosis Date Noted  . Aspiration into airway   . Fracture   . Encounter for feeding tube placement   . Protein-calorie malnutrition, severe 09/17/2019  . Acute respiratory failure ([place]) 09/15/2019  . AKI (acute kidney injury) ([place]) 09/15/2019  . Rhabdomyolysis  09/15/2019  . Acute renal failure (ARF) ([place]) 09/15/2019  . Pressure injury of skin 09/15/2019  . Closed fracture of right proximal humerus 09/15/2019  . Carotid stenosis, left 04/28/2014  . Stroke ([place]) 04/28/2014  . Diabetes ([place]) 11/03/2013  . HTN (hypertension) 11/03/2013  . Hyperlipidemia with target low density lipoprotein (LDL) cholesterol less than 70 mg/dL 11/03/2013  . ICH (intracerebral hemorrhage) ([place]) 07/18/2013    Palliative Care Assessment & Plan   Recommendations/Plan:  Continue with current plan of care  Detailed follow up discussion with brother and his wife regarding nutrition (PEG vs no PEG/comfort feeds). Family continues to request additional time to think things over before making final decision. Brother expressing they were hopeful he would eat and do ok.   PMT will continue to support and follow   Goals of Care and Additional Recommendations:  Limitations on Scope of Treatment: Full Scope Treatment  Code Status:    Code Status Orders  (From admission, onward)         Start     Ordered   09/15/19 1646  Full code  Continuous     09/15/19 1653        Code Status History    This patient has a current code status but no historical code status.   Advance Care Planning Activity       Prognosis:  Guarded to Poor   Discharge Planning:  To Be Determined  Care plan was discussed with patient's brother and Dr. Eliseo Squires.   Thank you for allowing the Palliative Medicine Team to assist in the care of this patient.  Total Time: 45 min.   Greater than 50%  of this time was spent counseling and coordinating care related to the above assessment and plan.  Alda Lea, AGPCNP-BC Palliative Medicine Team   Please contact Palliative Medicine Team phone at [phone] for questions and concerns.

## 2019-10-14 DIAGNOSIS — Z9289 Personal history of other medical treatment: Secondary | ICD-10-CM

## 2019-10-14 LAB — GLUCOSE, CAPILLARY
Glucose-Capillary: 100 mg/dL — ABNORMAL HIGH (ref 70–99)
Glucose-Capillary: 104 mg/dL — ABNORMAL HIGH (ref 70–99)
Glucose-Capillary: 115 mg/dL — ABNORMAL HIGH (ref 70–99)
Glucose-Capillary: 118 mg/dL — ABNORMAL HIGH (ref 70–99)
Glucose-Capillary: 118 mg/dL — ABNORMAL HIGH (ref 70–99)
Glucose-Capillary: 123 mg/dL — ABNORMAL HIGH (ref 70–99)

## 2019-10-14 LAB — RENAL FUNCTION PANEL
Albumin: 2.1 g/dL — ABNORMAL LOW (ref 3.5–5.0)
Anion gap: 10 (ref 5–15)
BUN: 75 mg/dL — ABNORMAL HIGH (ref 8–23)
CO2: 21 mmol/L — ABNORMAL LOW (ref 22–32)
Calcium: 8.1 mg/dL — ABNORMAL LOW (ref 8.9–10.3)
Chloride: 106 mmol/L (ref 98–111)
Creatinine, Ser: 4.67 mg/dL — ABNORMAL HIGH (ref 0.61–1.24)
GFR calc Af Amer: 14 mL/min — ABNORMAL LOW (ref >60)
GFR calc non Af Amer: 12 mL/min — ABNORMAL LOW (ref >60)
Glucose, Bld: 112 mg/dL — ABNORMAL HIGH (ref 70–99)
Phosphorus: 5.9 mg/dL — ABNORMAL HIGH (ref 2.5–4.6)
Potassium: 5.4 mmol/L — ABNORMAL HIGH (ref 3.5–5.1)
Sodium: 137 mmol/L (ref 135–145)

## 2019-10-14 LAB — CBC
HCT: 32.9 % — ABNORMAL LOW (ref 39.0–52.0)
Hemoglobin: 10.1 g/dL — ABNORMAL LOW (ref 13.0–17.0)
MCH: 30.3 pg (ref 26.0–34.0)
MCHC: 30.7 g/dL (ref 30.0–36.0)
MCV: 98.8 fL (ref 80.0–100.0)
Platelets: 318 10*3/uL (ref 150–400)
RBC: 3.33 MIL/uL — ABNORMAL LOW (ref 4.22–5.81)
RDW: 15.7 % — ABNORMAL HIGH (ref 11.5–15.5)
WBC: 6.9 10*3/uL (ref 4.0–10.5)
nRBC: 0 % (ref 0.0–0.2)

## 2019-10-14 LAB — APTT: aPTT: 35 seconds (ref 24–36)

## 2019-10-14 NOTE — Progress Notes (Signed)
PALLIATIVE NOTE:  Chart reviewed and updates received. Patient continues to have barriers to nutrition due to chronic dysphagia and high risk of aspiration. Extensive discussions with family this week regarding patient's overall condition and long-term challenges in regards to nutrition and overall health and functional decline.   Follow-up meeting with  family via conference call (mother, brother, and sister-in-law) today. Updates provided and detailed discussions and education regarding artifical feeding, disease trajectory, and functional state. Mother continues to remain hopeful patient will improve. Brother verbalizes his agreement and expresses they wish to allow patient every opportunity to show improvement or some stability.   Family reports they discussed patient's health and needs today as a family and feel they are not prepared to do anything less than offer him all available care. Family is requesting patient have PEG tube placed for nutritional support given risk of aspiration and dysphagia. Family educated on the pros and cons of PEG placement with consideration to patient's current illness and co-morbidities. Family verbalized understanding and risk. They again verbalized wishes to proceed with PEG placement.   I also discussed concerns of patient's care once he discharges home from either hospital or SNF given he resides in the home with his 45 year old mother. Patient's sister-in-law verbalizes understanding and expressed family is working on alternative plans but are hopeful patient will go to SNF after hospitalization.   Family confirms wishes to remain a full code again sharing previous experience and patient's ability to successfully wean from vent on occassions earlier this admission.   Goals are set and clear. Family requesting full scope aggressive care. They would like to proceed with PEG placement for nutritional support.   Plan -Full Code-as requested by family -Continue  with current plan of care per medical team -Family remains hopeful for improvement.  -Family requesting PEG tube and SNF placement for rehab.  -PMT will continue to support and and follow as needed. I will be off service after today. Please call Palliative at [phone] for urgent needs.   Total Time: 55 min.   Greater than 50%  of this time was spent counseling and coordinating care related to the above assessment and plan.  Alda Lea, AGPCNP-BC Palliative Medicine Team  Phone: [phone] Amion: Bjorn Pippin

## 2019-10-14 NOTE — Progress Notes (Signed)
Staples removed from R shoulder per order. Incision intact, edges well approximated, no signs of infection. 25 staples removed in total.

## 2019-10-14 NOTE — Progress Notes (Signed)
Family has notified palliative care they will to pursue a PEG tube for feeding .  [facility] order for IR.

## 2019-10-14 NOTE — Progress Notes (Signed)
PROGRESS NOTE  Ryan Villa [id number] DOB: [birth year] DOA: 09/15/2019 PCP: Maryella Shivers, MD   LOS: 29 days   Brief narrative:  64 year old male was transferred from [facility] 12/23 for acute respiratory failure, rhabdomyolysis, acute kidney injury due to services unavailable at that facility.  He was in the ICU intubated and self extubated on 12/25. Transferred out of ICU 12/27. The patient was taken for right shoulder reverse arthroplasty 1/5. Early AM, 1/8 had vomiting with probable aspiration. Placed on NRB and transferred to ICU for intubation 10/01/2019. He was weaned to extubation on 10/04/2019. He has been evaluated by SLP and has a Cortrak tube in place.  Family has met with palliative care and is hopeful that he will be able to eat so they are not yet ready for PEG or comfort care.  Assessment/Plan:  Principal Problem:   Closed fracture of right proximal humerus Active Problems:   Acute respiratory failure (HCC)   AKI (acute kidney injury) ([place])   Rhabdomyolysis   Acute renal failure (ARF) (HCC)   Pressure injury of skin   Protein-calorie malnutrition, severe   Encounter for feeding tube placement   Aspiration into airway   Fracture  Acute hypoxic respiratory failure from Pseudomonal HCAP. resolved. Extubated on 10/04/2019 and was transferred out of the ICU.  Completed IV meropenem x 7 day course. Speech therapy on board and is on cortak tube feeding.  Continue flutter valve.  Currently saturating 98% on room air. -aspiration precuations   Dysphagia and vomiting leading to aspiration pneumonia: Status post modified barium swallow.  Cortrak tube in place and patient has been seen by speech therapy at intervals  and recommend puree with nursing supervision but still with cortrak.     Had repeat CXR on 1/13 without active disease.   -Continue aspiration precautions.  - Patient does have a chronic dysphagia and aspiration risk as per speech therapist.  Difficulty  maintaining nutrition on cortak.   -Palliative care discussed with the family today-- not wanting PEG, hopeful he will eat-- will need to revisit   Severe protein calorie malnutrition: Present on admission.  Currently on cortrak tube feeding.   Nutrition on board.  Patient has high aspiration risk and is unable to meet nutritional demands unless he has long term plans for enteral feeding.  Patient's mother unable to make a decision (?dementia) - Palliative care has met with family-- not ready for hospice yet nor a PEG tube  CKD 5: - Baseline creatinine around 5.   -No plans for HD  as per nephrology (signed off 1/15)   Hyperkalemia.   -high but flat -daily labs  Hypernatremia:  -Improved.  Continue water flushes.    Anemia of critical illness and chronic disease:  -monitor -hold on aranesp as hgb >10  Hyperglycemia:  -SSI  Heparin-induced thrombocytopenia.  -was on argatroban and switched to eliquis. .   Essential Hypertension: off medications. Closely monitor.  Right proximal humerus fracture status post reverse shoulder arthroplasty 1/5.   in Sling.     VTE Prophylaxis: eliquis  Code Status: Full code  Family Communication:  Family met with palliative care  Disposition Plan:  Skilled nursing facility placement, pending improvement in nutrition  Patient has chronic dysphagia and is still on cortak tube. Long term feeding is a big issue as the patient is severely malnourished and has huge aspiration risk.  Palliative care discussed with family but not ready to make hospice yet  Consultants:  Orthopedic surgery  Wound Care  Nephrology  PCCM  Palliative care  Procedures:  Intubation x 2  ORIF of right shoulder  CorTrak tube placement  Antibiotics: Meropenem 1/11> completed 7 day course  Anti-infectives (From admission, onward)   Start     Dose/Rate Route Frequency Ordered Stop   10/04/19 1230  meropenem (MERREM) 500 mg in sodium chloride 0.9 % 100  mL IVPB     500 mg 200 mL/hr over 30 Minutes Intravenous Every 24 hours 10/04/19 1213 10/10/19 1231   10/04/19 1000  ceFEPIme (MAXIPIME) 1 g in sodium chloride 0.9 % 100 mL IVPB  Status:  Discontinued     1 g 200 mL/hr over 30 Minutes Intravenous Every 24 hours 10/03/19 0921 10/04/19 1213   10/03/19 0930  ceFEPIme (MAXIPIME) 1 g in sodium chloride 0.9 % 100 mL IVPB     1 g 200 mL/hr over 30 Minutes Intravenous STAT 10/03/19 0921 10/03/19 1100   10/03/19 0800  cefTAZidime (FORTAZ) 0.5 g in dextrose 5 % 50 mL IVPB  Status:  Discontinued     0.5 g 100 mL/hr over 30 Minutes Intravenous Every 24 hours 10/03/19 0705 10/03/19 0921   10/01/19 0800  Ampicillin-Sulbactam (UNASYN) 3 g in sodium chloride 0.9 % 100 mL IVPB  Status:  Discontinued     3 g 200 mL/hr over 30 Minutes Intravenous Every 12 hours 10/01/19 0640 10/03/19 0655   09/28/19 2030  ceFAZolin (ANCEF) IVPB 1 g/50 mL premix     1 g 100 mL/hr over 30 Minutes Intravenous Every 12 hours 09/28/19 1624 09/29/19 1910   09/28/19 1157  vancomycin (VANCOCIN) powder  Status:  Discontinued       As needed 09/28/19 1158 09/28/19 1457   09/28/19 0600  vancomycin (VANCOCIN) IVPB 1000 mg/200 mL premix  Status:  Discontinued     1,000 mg 200 mL/hr over 60 Minutes Intravenous On call to O.R. 09/27/19 1159 09/28/19 1607   09/28/19 0000  ceFAZolin (ANCEF) IVPB 2g/100 mL premix    Note to Pharmacy: Surgery delayed until 1/5.   2 g 200 mL/hr over 30 Minutes Intravenous To Surgery 09/22/19 0904 09/28/19 1222   09/22/19 0815  ceFAZolin (ANCEF) IVPB 2g/100 mL premix  Status:  Discontinued     2 g 200 mL/hr over 30 Minutes Intravenous To Surgery 09/22/19 0806 09/22/19 0905   09/15/19 1715  azithromycin (ZITHROMAX) 500 mg in sodium chloride 0.9 % 250 mL IVPB  Status:  Discontinued     500 mg 250 mL/hr over 60 Minutes Intravenous Every 24 hours 09/15/19 1711 09/18/19 1107   09/15/19 1715  cefTRIAXone (ROCEPHIN) 1 g in sodium chloride 0.9 % 100 mL IVPB     1  g 200 mL/hr over 30 Minutes Intravenous Every 24 hours 09/15/19 1711 09/21/19 1754     Subjective: No overnight events  Objective: Vitals:   10/14/19 0817 10/14/19 1154  BP: 126/61 (!) 167/84  Pulse: 66 64  Resp: 20 20  Temp: 98 F (36.7 C) (!) 97.5 F (36.4 C)  SpO2: 97% 98%    Intake/Output Summary (Last 24 hours) at 10/14/2019 1301 Last data filed at 10/14/2019 1020 Gross per 24 hour  Intake --  Output 1675 ml  Net -1675 ml   Filed Weights   10/12/19 0500 10/13/19 0324 10/14/19 0325  Weight: 67.9 kg 67.5 kg 68.7 kg   Body mass index is 23.03 kg/m.   Physical Exam: Chronically ill-appearing In bed with covers over her head Will awaken but would not speak Core track  tube in place Right BKA Right arm in sling  Data Review: I have personally reviewed the following laboratory data and studies,  CBC: Recent Labs  Lab 10/10/19 0727 10/11/19 0240 10/12/19 0354 10/13/19 0214 10/14/19 0418  WBC 7.8 8.6 7.4 7.9 6.9  HGB 10.7* 10.6* 10.5* 10.3* 10.1*  HCT 33.5* 33.5* 34.1* 33.3* 32.9*  MCV 96.3 96.5 100.6* 99.7 98.8  PLT 248 262 265 309 034   Basic Metabolic Panel: Recent Labs  Lab 10/08/19 0249 10/09/19 0315 10/10/19 0727 10/11/19 0240 10/12/19 0354 10/13/19 0214 10/14/19 0418  NA 140   < > 136 137 137 136 137  K 4.9   < > 5.1 5.4* 5.5* 5.5* 5.4*  CL 110   < > 106 106 108 105 106  CO2 20*   < > 22 22 20* 22 21*  GLUCOSE 162*   < > 108* 116* 123* 110* 112*  BUN 72*   < > 66* 67* 71* 73* 75*  CREATININE 4.95*   < > 4.63* 4.59* 4.84* 5.04* 4.67*  CALCIUM 7.4*   < > 8.3* 8.3* 7.9* 7.9* 8.1*  MG 2.0  --   --   --   --   --   --   PHOS 4.8*   < > 5.2* 5.7* 6.3* 6.3* 5.9*   < > = values in this interval not displayed.   Liver Function Tests: Recent Labs  Lab 10/10/19 0727 10/11/19 0240 10/12/19 0354 10/13/19 0214 10/14/19 0418  ALBUMIN 1.9* 1.9* 1.9* 1.9* 2.1*   No results for input(s): LIPASE, AMYLASE in the last 168 hours. No results for  input(s): AMMONIA in the last 168 hours. Cardiac Enzymes: No results for input(s): CKTOTAL, CKMB, CKMBINDEX, TROPONINI in the last 168 hours. BNP (last 3 results) Recent Labs    09/15/19 1822  BNP 61.3    ProBNP (last 3 results) No results for input(s): PROBNP in the last 8760 hours.  CBG: Recent Labs  Lab 10/13/19 1927 10/13/19 2328 10/14/19 0321 10/14/19 0818 10/14/19 1154  GLUCAP 116* 112* 115* 118* 100*   No results found for this or any previous visit (from the past 240 hour(s)).   Studies: No results found.  Scheduled Meds: . apixaban  5 mg Per Tube BID  . chlorhexidine gluconate (MEDLINE KIT)  15 mL Mouth Rinse BID  . Chlorhexidine Gluconate Cloth  6 each Topical Daily  . docusate  100 mg Per Tube BID  . feeding supplement (PRO-STAT SUGAR FREE 64)  30 mL Per Tube Daily  . free water  300 mL Per Tube Q4H  . insulin aspart  0-9 Units Subcutaneous Q4H  . mouth rinse  15 mL Mouth Rinse q12n4p  . pantoprazole sodium  40 mg Per Tube Daily  . pneumococcal 23 valent vaccine  0.5 mL Intramuscular Tomorrow-1000  . polyethylene glycol  17 g Per Tube BID    Continuous Infusions: . sodium chloride Stopped (10/05/19 2000)  . feeding supplement (NEPRO CARB STEADY) 1,000 mL (10/14/19 0500)    Geradine Girt, DO  [facility] 10/14/2019

## 2019-10-15 ENCOUNTER — Inpatient Hospital Stay (HOSPITAL_COMMUNITY): Payer: Medicare Other

## 2019-10-15 LAB — CBC
HCT: 34.1 % — ABNORMAL LOW (ref 39.0–52.0)
Hemoglobin: 10.7 g/dL — ABNORMAL LOW (ref 13.0–17.0)
MCH: 30.3 pg (ref 26.0–34.0)
MCHC: 31.4 g/dL (ref 30.0–36.0)
MCV: 96.6 fL (ref 80.0–100.0)
Platelets: 322 10*3/uL (ref 150–400)
RBC: 3.53 MIL/uL — ABNORMAL LOW (ref 4.22–5.81)
RDW: 15.5 % (ref 11.5–15.5)
WBC: 7.4 10*3/uL (ref 4.0–10.5)
nRBC: 0 % (ref 0.0–0.2)

## 2019-10-15 LAB — RENAL FUNCTION PANEL
Albumin: 2.1 g/dL — ABNORMAL LOW (ref 3.5–5.0)
Anion gap: 8 (ref 5–15)
BUN: 77 mg/dL — ABNORMAL HIGH (ref 8–23)
CO2: 21 mmol/L — ABNORMAL LOW (ref 22–32)
Calcium: 8.1 mg/dL — ABNORMAL LOW (ref 8.9–10.3)
Chloride: 105 mmol/L (ref 98–111)
Creatinine, Ser: 4.71 mg/dL — ABNORMAL HIGH (ref 0.61–1.24)
GFR calc Af Amer: 14 mL/min — ABNORMAL LOW (ref >60)
GFR calc non Af Amer: 12 mL/min — ABNORMAL LOW (ref >60)
Glucose, Bld: 120 mg/dL — ABNORMAL HIGH (ref 70–99)
Phosphorus: 5.7 mg/dL — ABNORMAL HIGH (ref 2.5–4.6)
Potassium: 5.5 mmol/L — ABNORMAL HIGH (ref 3.5–5.1)
Sodium: 134 mmol/L — ABNORMAL LOW (ref 135–145)

## 2019-10-15 LAB — GLUCOSE, CAPILLARY
Glucose-Capillary: 100 mg/dL — ABNORMAL HIGH (ref 70–99)
Glucose-Capillary: 118 mg/dL — ABNORMAL HIGH (ref 70–99)
Glucose-Capillary: 128 mg/dL — ABNORMAL HIGH (ref 70–99)
Glucose-Capillary: 97 mg/dL (ref 70–99)
Glucose-Capillary: 125 mg/dL — ABNORMAL HIGH (ref 70–99)
Glucose-Capillary: 84 mg/dL (ref 70–99)

## 2019-10-15 LAB — APTT: aPTT: 35 seconds (ref 24–36)

## 2019-10-15 LAB — POTASSIUM: Potassium: 5.7 mmol/L — ABNORMAL HIGH (ref 3.5–5.1)

## 2019-10-15 MED ORDER — SODIUM POLYSTYRENE SULFONATE 15 GM/60ML PO SUSP
15.0000 g | Freq: Two times a day (BID) | ORAL | Status: AC
  Administered 2019-10-15 (×2): 15 g
  Filled 2019-10-15 (×2): qty 60

## 2019-10-15 NOTE — Progress Notes (Signed)
Physical Therapy Treatment Patient Details Name: Ryan Villa MRN: [id number] DOB: 1956/02/22 Today's Date: 10/15/2019    History of Present Illness Pt adm with rt humeral fx after fall at home and in the floor x 3 days. Pt intubated 12/23 and self extubated 12/25. Pt with acute metabolic encephalopathy and delirium, PMH -R BKA, ckd, dm, htn, intracerebral hemorrhage. Pt now s/p R TSA 1/5. Pt re-intubated 1/8 due to suspected aspiration PNA.    PT Comments    Pt performed limited session.  On arrival soiled in stool.  Required multiple bouts of rolling to clean patient and perform pericare.  Pt required assistance to mobilize to edge of bed and transfer to recliner.  Once sitting repositioned sling for correct fit.  On arrival. R LE was internally rotated and R hand placed under patients bottom.  He also presented with break down on weight bearing portion of R LE.  He sits with legs crossed and resting on end of residual limb.  Spoke with nurse about need for prosthetic consult to improve fit of LE prosthesis to avoid further break down.  Continue to recommend SNF placement.    Follow Up Recommendations  SNF     Equipment Recommendations       Recommendations for Other Services       Precautions / Restrictions Precautions Precautions: Fall;Shoulder Type of Shoulder Precautions: okay to WB with transfers, otherwise no lifting; sling on when not ambulating or doing BADLs, okay for pendulums; no abduction; ER: 30 degrees; no AROM; okay hand wrist and elbow AROM Shoulder Interventions: Shoulder sling/immobilizer;For comfort Precaution Comments: R humerus ORIF 1/5, RUE flaccid on 1/15 Required Braces or Orthoses: Sling;Other Brace(poorly fitting R prosthesis noted with breakdown on R residual limb.) Restrictions Weight Bearing Restrictions: No Other Position/Activity Restrictions: able to WB with RW with transfers     Mobility  Bed Mobility Overal bed mobility: Needs Assistance Bed  Mobility: Rolling Rolling: Min assist Sidelying to sit: Mod assist       General bed mobility comments: Pt able to roll and elevate trunk into a seated position. Assistance for balance and to scoot hips to edge of bed.  Transfers Overall transfer level: Needs assistance Equipment used: (FACE to [facility] transfers.) Transfers: Sit to/from [employer] to Stand: Min assist Stand pivot transfers: Min assist       General transfer comment: Pt able to stand and pivot from bed to recliner.  Did not donn prosthesis as pt with noted breakdown on end of R residual limb.  Ambulation/Gait Ambulation/Gait assistance: Mod assist Gait Distance (Feet): 4 Feet(hop steps from bed to recliner holding therapist for balance.) Assistive device: 1 person hand held assist(FACE to [facility]) Gait Pattern/deviations: Narrow base of support     General Gait Details: Hop steps from bed to recliner.   Stairs             Wheelchair Mobility    Modified Rankin (Stroke Patients Only)       Balance Overall balance assessment: Needs assistance Sitting-balance support: Single extremity supported;No upper extremity supported;Feet supported Sitting balance-Leahy Scale: Poor Sitting balance - Comments: use of LUE to maintain sitting balance.     Standing balance-Leahy Scale: Poor Standing balance comment: External assistance to remain standing.                            Cognition Arousal/Alertness: Awake/alert Behavior During Therapy: Flat affect Overall Cognitive Status: Within Functional Limits  for tasks assessed(cognition not formally assessed but able to follow commands.) Area of Impairment: Orientation;Attention;Memory;Following commands;Safety/judgement;Awareness;Problem solving                 Orientation Level: Time;Situation Current Attention Level: Selective Memory: Decreased recall of precautions;Decreased short-term memory Following Commands:  Follows one step commands with increased time Safety/Judgement: Decreased awareness of safety Awareness: Intellectual Problem Solving: Requires verbal cues;Requires tactile cues;Slow processing General Comments: Pt limited due to distractions,  Attempting to use cardiac monitor to turn on television.      Exercises      General Comments        Pertinent Vitals/Pain Pain Assessment: No/denies pain Faces Pain Scale: No hurt Pain Location: not responding to painful stimuli this date Pain Descriptors / Indicators: Grimacing;Guarding Pain Intervention(s): Monitored during session;Repositioned    Home Living                      Prior Function            PT Goals (current goals can now be found in the care plan section) Acute Rehab PT Goals Patient Stated Goal: none stated Potential to Achieve Goals: Fair Progress towards PT goals: Progressing toward goals    Frequency    Min 2X/week      PT Plan Current plan remains appropriate    Co-evaluation              AM-PAC PT "6 Clicks" Mobility   Outcome Measure  Help needed turning from your back to your side while in a flat bed without using bedrails?: A Lot Help needed moving from lying on your back to sitting on the side of a flat bed without using bedrails?: A Lot Help needed moving to and from a bed to a chair (including a wheelchair)?: A Lot Help needed standing up from a chair using your arms (e.g., wheelchair or bedside chair)?: A Lot Help needed to walk in hospital room?: A Lot Help needed climbing 3-5 steps with a railing? : A Lot 6 Click Score: 12    End of Session Equipment Utilized During Treatment: Gait belt Activity Tolerance: Patient tolerated treatment well Patient left: in chair;with call bell/phone within reach;with chair alarm set Nurse Communication: Mobility status PT Visit Diagnosis: Other abnormalities of gait and mobility (R26.89);Muscle weakness (generalized)  (M62.81);Pain Pain - Right/Left: Right Pain - part of body: Shoulder     Time: 1124-1140 PT Time Calculation (min) (ACUTE ONLY): 16 min  Charges:  $Therapeutic Activity: 8-22 mins                     Erasmo Leventhal , PTA Acute Rehabilitation Services Pager [phone] Office [phone]     Benicio Manna Eli Hose 10/15/2019, 4:51 PM

## 2019-10-15 NOTE — Progress Notes (Signed)
Nutrition Follow-up  DOCUMENTATION CODES:   Severe malnutrition in context of acute illness/injury  INTERVENTION:  Continue Nepro formula via tube at goal rate of 50 ml/hr.   Continue 30 ml Prostat once daily per tube.  Free water flushes of 300 ml every 4 hours (MD to adjust as appropriate)  Tube feeding regimen to provide 2260 kcal, 112 grams of protein, and 2676 ml free water.   NUTRITION DIAGNOSIS:   Severe Malnutrition related to acute illness(S/P fall at home with rhabdomyolysis after lying on the floor for 3 days) as evidenced by moderate fat depletion, moderate muscle depletion, severe muscle depletion, severe fat depletion; ongoing  GOAL:   Patient will meet greater than or equal to 90% of their needs; met with TF  MONITOR:   PO intake, Labs, Weight trends, TF tolerance, I & O's  REASON FOR ASSESSMENT:   Consult(verbal for consideration of TF formula change given elevated K levels) Enteral/tube feeding initiation and management  ASSESSMENT:   64 yo male admitted with respiratory failure, rhabdomyolysis, and AKI after fall at home 3 days PTA. He had been lying on the floor x 3 days since his fall. Found to have PNA, UTI, and right proximal humerus fracture. PMH includes CKD-IV, HLD, DM-2, HTN, L carotid stenosis, ICH, R BKA.  12/25- extubated  12/28- failed MBS, gastric Cortrak placed 1/5- s/p R reverse shoulder arthroplasty  1/2- Cortrak removed, diet advanced DYS1/nectar thick liquids 1/8 vomited x 4 and aspirated, pt tx to ICU and intubated 1/11 extubated 1/12 Cortrak placed, tip of tube in stomach  Per Palliative, family desires pt to pursue PEG tube. Pt remains full code. IR consulted for PEG. Pt currently tolerating his tube feeds via Cortrak NGT well with no difficulties. RD to continue with current orders and monitor for tolerance.   Labs and medications reviewed. Potassium elevated at 5.7. Phosphorous elevated at 5.7.  Diet Order:   Diet Order         Diet NPO time specified Except for: Other (See Comments)  Diet effective now              EDUCATION NEEDS:   Not appropriate for education at this time  Skin:  Skin Assessment: Skin Integrity Issues: Skin Integrity Issues:: Other (Comment) Stage II: N/A Other: skin tear x 2 to R forearm  Last BM:  1/20  Height:   Ht Readings from Last 1 Encounters:  10/01/19 '5\' 8"'  (1.727 m)    Weight:   Wt Readings from Last 1 Encounters:  10/14/19 69 kg    Ideal Body Weight:  65.5 kg(adjusted for BKA)  BMI:  Body mass index is 23.13 kg/m.  Estimated Nutritional Needs:   Kcal:  2000-2200  Protein:  100-125 grams  Fluid:  >2 L/day    Corrin Parker, MS, RD, LDN Pager # [phone] After hours/ weekend pager # [phone]

## 2019-10-15 NOTE — Progress Notes (Signed)
PROGRESS NOTE    Ryan Villa  [id number] DOB: December 27, 1955 DOA: 09/15/2019 PCP: Maryella Shivers, MD   Brief Narrative:  This 64 year old male was transferred from [facility] 12/23 for acute respiratory failure, rhabdomyolysis, acute kidney injury due to services unavailable at that facility.  He was in the ICU intubated and self extubated on 12/25. Transferred out of ICU 12/27.The patient was taken for right shoulder reverse arthroplasty 1/5.Early AM, 1/8 had vomiting with probable aspiration. Placed on NRB and transferred to ICU for intubation 10/01/2019.He was weaned to extubation on 10/04/2019. He has been evaluated by SLP and has a Cortrak tube in place.  Family has met with palliative care and is hopeful that he will be able to eat so they are not yet ready for PEG or comfort care. Family made a decision about PEG tube but patient declined, Pt currently tolerating his tube feeds via Cortrak NGT well with no difficulties.   Assessment & Plan:   Principal Problem:   Closed fracture of right proximal humerus Active Problems:   Acute respiratory failure (HCC)   AKI (acute kidney injury) ([place])   Rhabdomyolysis   Acute renal failure (ARF) (HCC)   Pressure injury of skin   Protein-calorie malnutrition, severe   Encounter for feeding tube placement   Aspiration into airway   Fracture  Acute hypoxic respiratory failure from Pseudomonal HCAP.resolved. Extubated on 10/04/2019 and was transferred out of the ICU.  Completed IV meropenem x 7 day course. Speech therapy on board and is on cortak tube feeding.  Continue flutter valve.  Currently saturating 98% on room air. -aspiration precuations   Dysphagia and vomiting leading to aspiration pneumonia: Status post modified barium swallow.  Cortrak tube in place and patient has been seen by speech therapy at intervals  and recommend puree with nursing supervision but still with cortrak.     Had repeat CXR on 1/13 without active disease.    -Continue aspiration precautions.  - Patient does have a chronic dysphagia and aspiration risk as per speech therapist.  Difficulty maintaining nutrition on cortak.   -Palliative care discussed with the family today-- not wanting PEG, hopeful he will eat-- will need to revisit. -Pt currently tolerating his tube feeds via Cortrak NGT well with no difficulties. -Family has agreed for the PEG tube.  Patient declined.   Severe protein calorie malnutrition: Present on admission.  Currently on cortrak tube feeding.   Nutrition on board.  Patient has high aspiration risk and is unable to meet nutritional demands unless he has long term plans for enteral feeding.  Patient's mother unable to make a decision (?dementia) - Palliative care has met with family-- not ready for hospice yet nor a PEG tube  CKD 5: - Baseline creatinine around 5.   -No plans for HD  as per nephrology (signed off 1/15)   Hyperkalemia.   -high but flat -daily labs Kayaxalate bid x 2 doses. Recheck K  Hypernatremia:  -Improved.  Continue water flushes.    Anemia of critical illness and chronic disease:  -monitor -hold on aranesp as hgb >10  Hyperglycemia:  -SSI  Heparin-induced thrombocytopenia. -was on argatroban and switched to eliquis. .   Essential Hypertension: off medications. Closely monitor.  Right proximal humerus fracture status post reverse shoulder arthroplasty 1/5.   in Sling.     DVT prophylaxis: Eliquis Code Status: Full code Family Communication: Family met with palliative care Disposition Plan: Skilled nursing facility placement, pending improvement in nutrition  Patient has chronic dysphagia  and is still on cortak tube. Long term feeding is a big issue as the patient is severely malnourished and has huge aspiration risk.  Palliative care discussed with family but not ready to make hospice yet.   Consultants:   Orthopedic surgery  Wound  care  Nephrology  PCCM  Palliative  Procedures:   Intubation x 2  ORIF of right shoulder  CorTrak tube placement  Antimicrobials:  Anti-infectives (From admission, onward)   Start     Dose/Rate Route Frequency Ordered Stop   10/04/19 1230  meropenem (MERREM) 500 mg in sodium chloride 0.9 % 100 mL IVPB     500 mg 200 mL/hr over 30 Minutes Intravenous Every 24 hours 10/04/19 1213 10/10/19 1231   10/04/19 1000  ceFEPIme (MAXIPIME) 1 g in sodium chloride 0.9 % 100 mL IVPB  Status:  Discontinued     1 g 200 mL/hr over 30 Minutes Intravenous Every 24 hours 10/03/19 0921 10/04/19 1213   10/03/19 0930  ceFEPIme (MAXIPIME) 1 g in sodium chloride 0.9 % 100 mL IVPB     1 g 200 mL/hr over 30 Minutes Intravenous STAT 10/03/19 0921 10/03/19 1100   10/03/19 0800  cefTAZidime (FORTAZ) 0.5 g in dextrose 5 % 50 mL IVPB  Status:  Discontinued     0.5 g 100 mL/hr over 30 Minutes Intravenous Every 24 hours 10/03/19 0705 10/03/19 0921   10/01/19 0800  Ampicillin-Sulbactam (UNASYN) 3 g in sodium chloride 0.9 % 100 mL IVPB  Status:  Discontinued     3 g 200 mL/hr over 30 Minutes Intravenous Every 12 hours 10/01/19 0640 10/03/19 0655   09/28/19 2030  ceFAZolin (ANCEF) IVPB 1 g/50 mL premix     1 g 100 mL/hr over 30 Minutes Intravenous Every 12 hours 09/28/19 1624 09/29/19 1910   09/28/19 1157  vancomycin (VANCOCIN) powder  Status:  Discontinued       As needed 09/28/19 1158 09/28/19 1457   09/28/19 0600  vancomycin (VANCOCIN) IVPB 1000 mg/200 mL premix  Status:  Discontinued     1,000 mg 200 mL/hr over 60 Minutes Intravenous On call to O.R. 09/27/19 1159 09/28/19 1607   09/28/19 0000  ceFAZolin (ANCEF) IVPB 2g/100 mL premix    Note to Pharmacy: Surgery delayed until 1/5.   2 g 200 mL/hr over 30 Minutes Intravenous To Surgery 09/22/19 0904 09/28/19 1222   09/22/19 0815  ceFAZolin (ANCEF) IVPB 2g/100 mL premix  Status:  Discontinued     2 g 200 mL/hr over 30 Minutes Intravenous To Surgery  09/22/19 0806 09/22/19 0905   09/15/19 1715  azithromycin (ZITHROMAX) 500 mg in sodium chloride 0.9 % 250 mL IVPB  Status:  Discontinued     500 mg 250 mL/hr over 60 Minutes Intravenous Every 24 hours 09/15/19 1711 09/18/19 1107   09/15/19 1715  cefTRIAXone (ROCEPHIN) 1 g in sodium chloride 0.9 % 100 mL IVPB     1 g 200 mL/hr over 30 Minutes Intravenous Every 24 hours 09/15/19 1711 09/21/19 1754     Subjective: Patient was seen and examined at bedside.  He is more alert, awake, appears comfortable,  watching television. Declines to have a PEG tube.  Objective: Vitals:   10/14/19 2335 10/15/19 0321 10/15/19 0809 10/15/19 1147  BP: (!) 116/55 119/71 (!) 145/69 132/67  Pulse: 61 61 62 64  Resp: _0 Temp: (!) 97.5 F (36.4 C) 98.9 F (37.2 C) 98 F (36.7 C) (!) 97.5 F (36.4 C)  TempSrc:  Oral  Oral Oral  SpO2: 99% 98% 97% 100%  Weight:      Height:        Intake/Output Summary (Last 24 hours) at 10/15/2019 1528 Last data filed at 10/15/2019 0300 Gross per 24 hour  Intake 450 ml  Output 1875 ml  Net -1425 ml   Filed Weights   10/13/19 0324 10/14/19 0325 10/14/19 2041  Weight: 67.5 kg 68.7 kg 69 kg    Examination:  General exam: Appears calm and comfortable, Cortak tube in nose. Respiratory system: Clear to auscultation. Respiratory effort normal. Cardiovascular system: S1 & S2 heard, RRR. No JVD, murmurs, rubs, gallops or clicks. No pedal edema. Gastrointestinal system: Abdomen is nondistended, soft and nontender. No organomegaly or masses felt. Normal bowel sounds heard. Central nervous system: Alert and oriented. No focal neurological deficits. Extremities:  No edema, no swelling. Skin: No rashes, lesions or ulcers Psychiatry: Judgement and insight appear normal. Mood & affect appropriate.     Data Reviewed: I have personally reviewed following labs and imaging studies  CBC: Recent Labs  Lab 10/11/19 0240 10/12/19 0354 10/13/19 0214 10/14/19 0418  10/15/19 0733  WBC 8.6 7.4 7.9 6.9 7.4  HGB 10.6* 10.5* 10.3* 10.1* 10.7*  HCT 33.5* 34.1* 33.3* 32.9* 34.1*  MCV 96.5 100.6* 99.7 98.8 96.6  PLT 262 265 309 318 322   Basic Metabolic Panel: Recent Labs  Lab 10/11/19 0240 10/11/19 0240 10/12/19 0354 10/13/19 0214 10/14/19 0418 10/15/19 0733 10/15/19 0931  NA 137  --  137 136 137 134*  --   K 5.4*   < > 5.5* 5.5* 5.4* 5.5* 5.7*  CL 106  --  108 105 106 105  --   CO2 22  --  20* 22 21* 21*  --   GLUCOSE 116*  --  123* 110* 112* 120*  --   BUN 67*  --  71* 73* 75* 77*  --   CREATININE 4.59*  --  4.84* 5.04* 4.67* 4.71*  --   CALCIUM 8.3*  --  7.9* 7.9* 8.1* 8.1*  --   PHOS 5.7*  --  6.3* 6.3* 5.9* 5.7*  --    < > = values in this interval not displayed.   GFR: Estimated Creatinine Clearance: 15.5 mL/min (A) (by C-G formula based on SCr of 4.71 mg/dL (H)). Liver Function Tests: Recent Labs  Lab 10/11/19 0240 10/12/19 0354 10/13/19 0214 10/14/19 0418 10/15/19 0733  ALBUMIN 1.9* 1.9* 1.9* 2.1* 2.1*   No results for input(s): LIPASE, AMYLASE in the last 168 hours. No results for input(s): AMMONIA in the last 168 hours. Coagulation Profile: No results for input(s): INR, PROTIME in the last 168 hours. Cardiac Enzymes: No results for input(s): CKTOTAL, CKMB, CKMBINDEX, TROPONINI in the last 168 hours. BNP (last 3 results) No results for input(s): PROBNP in the last 8760 hours. HbA1C: No results for input(s): HGBA1C in the last 72 hours. CBG: Recent Labs  Lab 10/14/19 1949 10/14/19 2334 10/15/19 0319 10/15/19 0811 10/15/19 1144  GLUCAP 104* 118* 100* 118* 128*   Lipid Profile: No results for input(s): CHOL, HDL, LDLCALC, TRIG, CHOLHDL, LDLDIRECT in the last 72 hours. Thyroid Function Tests: No results for input(s): TSH, T4TOTAL, FREET4, T3FREE, THYROIDAB in the last 72 hours. Anemia Panel: No results for input(s): VITAMINB12, FOLATE, FERRITIN, TIBC, IRON, RETICCTPCT in the last 72 hours. Sepsis Labs: No  results for input(s): PROCALCITON, LATICACIDVEN in the last 168 hours.  No results found for this or any previous visit (from   the past 240 hour(s)).    Radiology Studies: CT ABDOMEN WO CONTRAST  Result Date: 10/15/2019 CLINICAL DATA:  63-year-old male with a history of dysphagia EXAM: CT ABDOMEN WITHOUT CONTRAST TECHNIQUE: Multidetector CT imaging of the abdomen was performed following the standard protocol without IV contrast. COMPARISON:  None. FINDINGS: Lower chest: Linear opacities at the bilateral lung bases compatible with atelectasis/scarring. No pleural effusion. Coronary artery calcifications. Hepatobiliary: Unremarkable liver.  Unremarkable gallbladder. Pancreas: Unremarkable Spleen: Unremarkable Adrenals/Urinary Tract: - Right adrenal gland:  Unremarkable - Left adrenal gland: Unremarkable. - Right kidney: Thinning renal cortex. No hydronephrosis. Vascular calcifications. Unremarkable visualized proximal ureter. - Left Kidney: Thinning renal cortex. No hydronephrosis. Vascular calcifications. No nephrolithiasis. Unremarkable course the proximal ureter. Cyst on the lateral left renal cortex. Stomach/Bowel: Hiatal hernia. Otherwise unremarkable stomach. Post pyloric feeding tube terminates within the first portion the duodenum. Unremarkable visualized small bowel. Normal visualized appendix. Mild to moderate stool burden. Vascular/Lymphatic: Vascular calcifications. No adenopathy. Other: None Musculoskeletal: No acute displaced fracture. No significant degenerative changes. No bony canal narrowing. IMPRESSION: No acute CT finding. Aortic Atherosclerosis (ICD10-I70.0). Ancillary findings as above. Electronically Signed   By: Jaime  Wagner D.O.   On: 10/15/2019 09:29    Scheduled Meds: . apixaban  5 mg Per Tube BID  . chlorhexidine gluconate (MEDLINE KIT)  15 mL Mouth Rinse BID  . Chlorhexidine Gluconate Cloth  6 each Topical Daily  . docusate  100 mg Per Tube BID  . feeding supplement  (PRO-STAT SUGAR FREE 64)  30 mL Per Tube Daily  . free water  300 mL Per Tube Q4H  . insulin aspart  0-9 Units Subcutaneous Q4H  . mouth rinse  15 mL Mouth Rinse q12n4p  . pantoprazole sodium  40 mg Per Tube Daily  . pneumococcal 23 valent vaccine  0.5 mL Intramuscular Tomorrow-1000  . polyethylene glycol  17 g Per Tube BID  . sodium polystyrene  15 g Per Tube BID   Continuous Infusions: . sodium chloride Stopped (10/05/19 2000)  . feeding supplement (NEPRO CARB STEADY) 1,000 mL (10/14/19 1608)     LOS: 30 days    Time spent: 25 mins.    PARDEEP KUMAR, MD [facility]   If 7PM-7AM, please contact night-coverage 

## 2019-10-15 NOTE — Progress Notes (Signed)
Orthopedic Tech Progress Note Patient Details:  Ryan Villa [birth year] [id number] Called in order to [facility] for a STUMP SHRINKER SLEEVE Patient ID: [name], male   DOB: 1955-11-18, 65 y.o.   MRN: [id number]   Janit Pagan 10/15/2019, 5:27 PM

## 2019-10-16 LAB — CBC
HCT: 35.4 % — ABNORMAL LOW (ref 39.0–52.0)
Hemoglobin: 11.2 g/dL — ABNORMAL LOW (ref 13.0–17.0)
MCH: 30.6 pg (ref 26.0–34.0)
MCHC: 31.6 g/dL (ref 30.0–36.0)
MCV: 96.7 fL (ref 80.0–100.0)
Platelets: 326 10*3/uL (ref 150–400)
RBC: 3.66 MIL/uL — ABNORMAL LOW (ref 4.22–5.81)
RDW: 15.4 % (ref 11.5–15.5)
WBC: 7.2 10*3/uL (ref 4.0–10.5)
nRBC: 0 % (ref 0.0–0.2)

## 2019-10-16 LAB — GLUCOSE, CAPILLARY
Glucose-Capillary: 111 mg/dL — ABNORMAL HIGH (ref 70–99)
Glucose-Capillary: 112 mg/dL — ABNORMAL HIGH (ref 70–99)
Glucose-Capillary: 102 mg/dL — ABNORMAL HIGH (ref 70–99)
Glucose-Capillary: 132 mg/dL — ABNORMAL HIGH (ref 70–99)
Glucose-Capillary: 103 mg/dL — ABNORMAL HIGH (ref 70–99)
Glucose-Capillary: 104 mg/dL — ABNORMAL HIGH (ref 70–99)

## 2019-10-16 LAB — PHOSPHORUS: Phosphorus: 6 mg/dL — ABNORMAL HIGH (ref 2.5–4.6)

## 2019-10-16 LAB — COMPREHENSIVE METABOLIC PANEL
ALT: 27 U/L (ref 0–44)
AST: 29 U/L (ref 15–41)
Albumin: 2.2 g/dL — ABNORMAL LOW (ref 3.5–5.0)
Alkaline Phosphatase: 142 U/L — ABNORMAL HIGH (ref 38–126)
Anion gap: 9 (ref 5–15)
BUN: 79 mg/dL — ABNORMAL HIGH (ref 8–23)
CO2: 24 mmol/L (ref 22–32)
Calcium: 7.8 mg/dL — ABNORMAL LOW (ref 8.9–10.3)
Chloride: 100 mmol/L (ref 98–111)
Creatinine, Ser: 4.37 mg/dL — ABNORMAL HIGH (ref 0.61–1.24)
GFR calc Af Amer: 16 mL/min — ABNORMAL LOW (ref >60)
GFR calc non Af Amer: 13 mL/min — ABNORMAL LOW (ref >60)
Glucose, Bld: 109 mg/dL — ABNORMAL HIGH (ref 70–99)
Potassium: 4.9 mmol/L (ref 3.5–5.1)
Sodium: 133 mmol/L — ABNORMAL LOW (ref 135–145)
Total Bilirubin: 0.6 mg/dL (ref 0.3–1.2)
Total Protein: 6.1 g/dL — ABNORMAL LOW (ref 6.5–8.1)

## 2019-10-16 LAB — APTT: aPTT: 33 seconds (ref 24–36)

## 2019-10-16 LAB — MAGNESIUM: Magnesium: 2.5 mg/dL — ABNORMAL HIGH (ref 1.7–2.4)

## 2019-10-16 MED ORDER — CALCIUM ACETATE (PHOS BINDER) 667 MG PO CAPS
667.0000 mg | ORAL_CAPSULE | Freq: Three times a day (TID) | ORAL | Status: AC
  Administered 2019-10-16 – 2019-10-18 (×6): 667 mg via ORAL
  Filled 2019-10-16 (×6): qty 1

## 2019-10-16 NOTE — Progress Notes (Signed)
Occupational Therapy Treatment Patient Details Name: Ryan Villa MRN: [id number] DOB: 1956-02-23 Today's Date: 10/16/2019    History of present illness Pt adm with rt humeral fx after fall at home and in the floor x 3 days. Pt intubated 12/23 and self extubated 12/25. Pt with acute metabolic encephalopathy and delirium, PMH -R BKA, ckd, dm, htn, intracerebral hemorrhage. Pt now s/p R TSA 1/5. Pt re-intubated 1/8 due to suspected aspiration PNA.   OT comments  Pt in bed and alert upon arrival. Pt soiled in BM, legs crossed, dirty gown, R UE sling not properly fitted, R UE underneath pt and pt leaning to L side with HOB raised. Pt able to roll for hygiene and cleaning. Pt sat EOB mod A and participated in washing face min A, changing to clean gown max - mod A with mod A for balance/suppoty. OT adjusted R UE sling to proper fit, pt returned to sup and positioned in midline with HOB elevated. OT will continue to follow acutely  Follow Up Recommendations  SNF;Supervision/Assistance - 24 hour    Equipment Recommendations  None recommended by OT    Recommendations for Other Services (Palliative care pending?)    Precautions / Restrictions Precautions Precautions: Fall;Shoulder Type of Shoulder Precautions: okay to WB with transfers, otherwise no lifting; sling on when not ambulating or doing BADLs, okay for pendulums; no abduction; ER: 30 degrees; no AROM; okay hand wrist and elbow AROM Shoulder Interventions: Shoulder sling/immobilizer;For comfort Precaution Comments: R humerus ORIF 1/5, RUE flaccid on 1/15 Required Braces or Orthoses: Sling;Other Brace Restrictions Weight Bearing Restrictions: No RUE Weight Bearing: Non weight bearing Other Position/Activity Restrictions: able to WB with RW with transfers        Mobility Bed Mobility Overal bed mobility: Needs Assistance Bed Mobility: Rolling Rolling: Min assist Sidelying to sit: Mod assist Supine to sit: Mod assist     General  bed mobility comments: assist to bring LEs to EOB, mod A for balance/support while seated to adjust R UE sling  Transfers                      Balance Overall balance assessment: Needs assistance Sitting-balance support: Single extremity supported;No upper extremity supported;Feet supported Sitting balance-Leahy Scale: Poor Sitting balance - Comments: use of LUE to maintain sitting balance.                                   ADL either performed or assessed with clinical judgement   ADL Overall ADL's : Needs assistance/impaired     Grooming: Wash/dry hands;Wash/dry face;Sitting Grooming Details (indicate cue type and reason): cues for initiation and completion (pt easily distracted)         Upper Body Dressing : Moderate assistance;Maximal assistance;Sitting           Toileting- Clothing Manipulation and Hygiene: Total assistance;Bed level         General ADL Comments: Pt soiled in BM upon entering room, legs crossed, diry gown, R UE sling not properly fitted, R UE underneath pt and pt leaning to L side with HOB raised. Pt able to roll for hygiene and cleaning     Vision Patient Visual Report: No change from baseline     Perception     Praxis      Cognition Arousal/Alertness: Awake/alert Behavior During Therapy: Flat affect Overall Cognitive Status: No family/caregiver present to determine baseline cognitive functioning Area  of Impairment: Orientation;Attention;Memory;Following commands;Safety/judgement;Awareness;Problem solving                 Orientation Level: Time;Situation   Memory: Decreased recall of precautions;Decreased short-term memory Following Commands: Follows one step commands with increased time Safety/Judgement: Decreased awareness of safety   Problem Solving: Requires verbal cues;Requires tactile cues;Slow processing General Comments: pt easily distracted        Exercises     Shoulder Instructions        General Comments      Pertinent Vitals/ Pain       Pain Assessment: No/denies pain Pain Score: 0-No pain Pain Intervention(s): Monitored during session;Repositioned  Home Living                                          Prior Functioning/Environment              Frequency  Min 2X/week        Progress Toward Goals  OT Goals(current goals can now be found in the care plan section)  Progress towards OT goals: OT to reassess next treatment  Acute Rehab OT Goals Patient Stated Goal: none stated  Plan Discharge plan remains appropriate;Frequency remains appropriate    Co-evaluation                 AM-PAC OT "6 Clicks" Daily Activity     Outcome Measure   Help from another person eating meals?: Total Help from another person taking care of personal grooming?: A Lot Help from another person toileting, which includes using toliet, bedpan, or urinal?: Total Help from another person bathing (including washing, rinsing, drying)?: Total Help from another person to put on and taking off regular upper body clothing?: A Lot Help from another person to put on and taking off regular lower body clothing?: Total 6 Click Score: 8    End of Session Equipment Utilized During Treatment: Other (comment)(R UE sling)  OT Visit Diagnosis: Muscle weakness (generalized) (M62.81);Other symptoms and signs involving cognitive function;Other abnormalities of gait and mobility (R26.89) Pain - Right/Left: Right Pain - part of body: Shoulder;Arm   Activity Tolerance Patient tolerated treatment well   Patient Left in bed;with call bell/phone within reach;with bed alarm set   Nurse Communication          Time: [id number] OT Time Calculation (min): 26 min  Charges: OT General Charges $OT Visit: 1 Visit OT Treatments $Self Care/Home Management : 8-22 mins $Therapeutic Activity: 8-22 mins     Britt Bottom 10/16/2019, 12:29 PM

## 2019-10-16 NOTE — Progress Notes (Signed)
PROGRESS NOTE    Ryan Villa  [id number] DOB: Sep 11, 1956 DOA: 09/15/2019 PCP: Maryella Shivers, MD   Brief Narrative:  This 64 year old male was transferred from [facility] 12/23 for acute respiratory failure, rhabdomyolysis, acute kidney injury due to services unavailable at that facility.  He was in the ICU intubated and self extubated on 12/25. Transferred out of ICU 12/27.The patient was taken for right shoulder reverse arthroplasty 1/5.Early AM, 1/8 had vomiting with probable aspiration. Placed on NRB and transferred to ICU for intubation 10/01/2019.He was weaned to extubation on 10/04/2019. He has been evaluated by SLP and has a Cortrak tube in place.  Family has met with palliative care and is hopeful that he will be able to eat so they are not yet ready for PEG or comfort care. Family made a decision about PEG tube but patient declined, Pt currently tolerating his tube feeds via Cortrak NGT well with no difficulties. Plan is to discharge to SNF   Assessment & Plan:   Principal Problem:   Closed fracture of right proximal humerus Active Problems:   Acute respiratory failure (HCC)   AKI (acute kidney injury) ([place])   Rhabdomyolysis   Acute renal failure (ARF) (HCC)   Pressure injury of skin   Protein-calorie malnutrition, severe   Encounter for feeding tube placement   Aspiration into airway   Fracture  Acute hypoxic respiratory failure from Pseudomonal HCAP.resolved. Extubated on 10/04/2019 and was transferred out of the ICU.  Completed IV meropenem x 7 day course. Speech therapy on board and is on cortak tube feeding.  Continue flutter valve.  Currently saturating 98% on room air. -aspiration precuations   Dysphagia and vomiting leading to aspiration pneumonia: Status post modified barium swallow.  Cortrak tube in place and patient has been seen by speech therapy at intervals  and recommend puree with nursing supervision but still with cortrak.     Had repeat CXR on  1/13 without active disease.   -Continue aspiration precautions.  - Patient does have a chronic dysphagia and aspiration risk as per speech therapist.  -Palliative care discussed with the family today-- not wanting PEG, hopeful he will eat-- will need to revisit. -Pt currently tolerating his tube feeds via Cortrak NGT well with no difficulties. -Family has agreed for the PEG tube.  Patient declined.   Severe protein calorie malnutrition: Present on admission.  Currently on cortrak tube feeding.   Nutrition on board.  Patient has high aspiration risk and is unable to meet nutritional demands unless he has long term plans for enteral feeding.  Patient's mother unable to make a decision (?dementia) - Palliative care has met with family-- not ready for hospice yet nor a PEG tube  CKD 5: - Baseline creatinine around 5.   -No plans for HD  as per nephrology (signed off 1/15)   Hyperkalemia.  Improved. -high but flat -daily labs Kayaxalate bid x 2 doses. Recheck K  Hypernatremia:  -Improved.  Continue water flushes.    Anemia of critical illness and chronic disease:  -monitor -hold on aranesp as hgb >10  Hyperglycemia:  -SSI  Heparin-induced thrombocytopenia. -was on argatroban and switched to eliquis. .   Essential Hypertension: off medications. Closely monitor.  Right proximal humerus fracture status post reverse shoulder arthroplasty 1/5.   in Sling.     DVT prophylaxis: Eliquis Code Status: Full code Family Communication: Family met with palliative care Disposition Plan: Skilled nursing facility placement, pending improvement in nutrition  Patient has chronic dysphagia and  is still on cortak tube. Long term feeding is a big issue as the patient is severely malnourished and has huge aspiration risk.  Palliative care discussed with family but not ready to make hospice yet.   Consultants:   Orthopedic surgery  Wound  care  Nephrology  PCCM  Palliative  Procedures:   Intubation x 2  ORIF of right shoulder  CorTrak tube placement  Antimicrobials:  Anti-infectives (From admission, onward)   Start     Dose/Rate Route Frequency Ordered Stop   10/04/19 1230  meropenem (MERREM) 500 mg in sodium chloride 0.9 % 100 mL IVPB     500 mg 200 mL/hr over 30 Minutes Intravenous Every 24 hours 10/04/19 1213 10/10/19 1231   10/04/19 1000  ceFEPIme (MAXIPIME) 1 g in sodium chloride 0.9 % 100 mL IVPB  Status:  Discontinued     1 g 200 mL/hr over 30 Minutes Intravenous Every 24 hours 10/03/19 0921 10/04/19 1213   10/03/19 0930  ceFEPIme (MAXIPIME) 1 g in sodium chloride 0.9 % 100 mL IVPB     1 g 200 mL/hr over 30 Minutes Intravenous STAT 10/03/19 0921 10/03/19 1100   10/03/19 0800  cefTAZidime (FORTAZ) 0.5 g in dextrose 5 % 50 mL IVPB  Status:  Discontinued     0.5 g 100 mL/hr over 30 Minutes Intravenous Every 24 hours 10/03/19 0705 10/03/19 0921   10/01/19 0800  Ampicillin-Sulbactam (UNASYN) 3 g in sodium chloride 0.9 % 100 mL IVPB  Status:  Discontinued     3 g 200 mL/hr over 30 Minutes Intravenous Every 12 hours 10/01/19 0640 10/03/19 0655   09/28/19 2030  ceFAZolin (ANCEF) IVPB 1 g/50 mL premix     1 g 100 mL/hr over 30 Minutes Intravenous Every 12 hours 09/28/19 1624 09/29/19 1910   09/28/19 1157  vancomycin (VANCOCIN) powder  Status:  Discontinued       As needed 09/28/19 1158 09/28/19 1457   09/28/19 0600  vancomycin (VANCOCIN) IVPB 1000 mg/200 mL premix  Status:  Discontinued     1,000 mg 200 mL/hr over 60 Minutes Intravenous On call to O.R. 09/27/19 1159 09/28/19 1607   09/28/19 0000  ceFAZolin (ANCEF) IVPB 2g/100 mL premix    Note to Pharmacy: Surgery delayed until 1/5.   2 g 200 mL/hr over 30 Minutes Intravenous To Surgery 09/22/19 0904 09/28/19 1222   09/22/19 0815  ceFAZolin (ANCEF) IVPB 2g/100 mL premix  Status:  Discontinued     2 g 200 mL/hr over 30 Minutes Intravenous To Surgery  09/22/19 0806 09/22/19 0905   09/15/19 1715  azithromycin (ZITHROMAX) 500 mg in sodium chloride 0.9 % 250 mL IVPB  Status:  Discontinued     500 mg 250 mL/hr over 60 Minutes Intravenous Every 24 hours 09/15/19 1711 09/18/19 1107   09/15/19 1715  cefTRIAXone (ROCEPHIN) 1 g in sodium chloride 0.9 % 100 mL IVPB     1 g 200 mL/hr over 30 Minutes Intravenous Every 24 hours 09/15/19 1711 09/21/19 1754     Subjective: Patient was seen and examined at bedside.  He is more alert, awake, appears comfortable,  watching television. Declines to have a PEG tube.  Objective: Vitals:   10/15/19 2312 10/16/19 0330 10/16/19 0805 10/16/19 1155  BP: (!) 90/57 (!) 142/77 (!) 144/74 (!) 161/58  Pulse: 71 68 69 68  Resp: _0 Temp: 98.4 F (36.9 C) 98.1 F (36.7 C) 98.9 F (37.2 C) (!) 97.5 F (36.4 C)  TempSrc:  Oral Oral Oral Oral  SpO2: 97% 98% 96% 100%  Weight:      Height:        Intake/Output Summary (Last 24 hours) at 10/16/2019 1341 Last data filed at 10/16/2019 0355 Gross per 24 hour  Intake --  Output 1775 ml  Net -1775 ml   Filed Weights   10/13/19 0324 10/14/19 0325 10/14/19 2041  Weight: 67.5 kg 68.7 kg 69 kg    Examination:  General exam: Appears calm and comfortable, Cortak tube in nose. Respiratory system: Clear to auscultation. Respiratory effort normal. Cardiovascular system: S1 & S2 heard, RRR. No JVD, murmurs, rubs, gallops or clicks. No pedal edema. Gastrointestinal system: Abdomen is nondistended, soft and nontender. No organomegaly or masses felt. Normal bowel sounds heard. Central nervous system: Alert and oriented. No focal neurological deficits. Extremities:  No edema, no swelling. Skin: No rashes, lesions or ulcers Psychiatry: Judgement and insight appear normal. Mood & affect appropriate.     Data Reviewed: I have personally reviewed following labs and imaging studies  CBC: Recent Labs  Lab 10/12/19 0354 10/13/19 0214 10/14/19 0418  10/15/19 0733 10/16/19 0335  WBC 7.4 7.9 6.9 7.4 7.2  HGB 10.5* 10.3* 10.1* 10.7* 11.2*  HCT 34.1* 33.3* 32.9* 34.1* 35.4*  MCV 100.6* 99.7 98.8 96.6 96.7  PLT 265 309 318 322 540   Basic Metabolic Panel: Recent Labs  Lab 10/12/19 0354 10/12/19 0354 10/13/19 0214 10/14/19 0418 10/15/19 0733 10/15/19 0931 10/16/19 0335  NA 137  --  136 137 134*  --  133*  K 5.5*   < > 5.5* 5.4* 5.5* 5.7* 4.9  CL 108  --  105 106 105  --  100  CO2 20*  --  22 21* 21*  --  24  GLUCOSE 123*  --  110* 112* 120*  --  109*  BUN 71*  --  73* 75* 77*  --  79*  CREATININE 4.84*  --  5.04* 4.67* 4.71*  --  4.37*  CALCIUM 7.9*  --  7.9* 8.1* 8.1*  --  7.8*  MG  --   --   --   --   --   --  2.5*  PHOS 6.3*  --  6.3* 5.9* 5.7*  --  6.0*   < > = values in this interval not displayed.   GFR: Estimated Creatinine Clearance: 16.7 mL/min (A) (by C-G formula based on SCr of 4.37 mg/dL (H)). Liver Function Tests: Recent Labs  Lab 10/12/19 0354 10/13/19 0214 10/14/19 0418 10/15/19 0733 10/16/19 0335  AST  --   --   --   --  29  ALT  --   --   --   --  27  ALKPHOS  --   --   --   --  142*  BILITOT  --   --   --   --  0.6  PROT  --   --   --   --  6.1*  ALBUMIN 1.9* 1.9* 2.1* 2.1* 2.2*   No results for input(s): LIPASE, AMYLASE in the last 168 hours. No results for input(s): AMMONIA in the last 168 hours. Coagulation Profile: No results for input(s): INR, PROTIME in the last 168 hours. Cardiac Enzymes: No results for input(s): CKTOTAL, CKMB, CKMBINDEX, TROPONINI in the last 168 hours. BNP (last 3 results) No results for input(s): PROBNP in the last 8760 hours. HbA1C: No results for input(s): HGBA1C in the last 72 hours. CBG: Recent Labs  Lab  10/15/19 1943 10/15/19 2311 10/16/19 0329 10/16/19 0807 10/16/19 1157  GLUCAP 125* 84 111* 112* 102*   Lipid Profile: No results for input(s): CHOL, HDL, LDLCALC, TRIG, CHOLHDL, LDLDIRECT in the last 72 hours. Thyroid Function Tests: No results for  input(s): TSH, T4TOTAL, FREET4, T3FREE, THYROIDAB in the last 72 hours. Anemia Panel: No results for input(s): VITAMINB12, FOLATE, FERRITIN, TIBC, IRON, RETICCTPCT in the last 72 hours. Sepsis Labs: No results for input(s): PROCALCITON, LATICACIDVEN in the last 168 hours.  No results found for this or any previous visit (from the past 240 hour(s)).    Radiology Studies: CT ABDOMEN WO CONTRAST  Result Date: 10/15/2019 CLINICAL DATA:  64 year old male with a history of dysphagia EXAM: CT ABDOMEN WITHOUT CONTRAST TECHNIQUE: Multidetector CT imaging of the abdomen was performed following the standard protocol without IV contrast. COMPARISON:  None. FINDINGS: Lower chest: Linear opacities at the bilateral lung bases compatible with atelectasis/scarring. No pleural effusion. Coronary artery calcifications. Hepatobiliary: Unremarkable liver.  Unremarkable gallbladder. Pancreas: Unremarkable Spleen: Unremarkable Adrenals/Urinary Tract: - Right adrenal gland:  Unremarkable - Left adrenal gland: Unremarkable. - Right kidney: Thinning renal cortex. No hydronephrosis. Vascular calcifications. Unremarkable visualized proximal ureter. - Left Kidney: Thinning renal cortex. No hydronephrosis. Vascular calcifications. No nephrolithiasis. Unremarkable course the proximal ureter. Cyst on the lateral left renal cortex. Stomach/Bowel: Hiatal hernia. Otherwise unremarkable stomach. Post pyloric feeding tube terminates within the first portion the duodenum. Unremarkable visualized small bowel. Normal visualized appendix. Mild to moderate stool burden. Vascular/Lymphatic: Vascular calcifications. No adenopathy. Other: None Musculoskeletal: No acute displaced fracture. No significant degenerative changes. No bony canal narrowing. IMPRESSION: No acute CT finding. Aortic Atherosclerosis (ICD10-I70.0). Ancillary findings as above. Electronically Signed   By: Corrie Mckusick D.O.   On: 10/15/2019 09:29    Scheduled Meds: .  apixaban  5 mg Per Tube BID  . calcium acetate  667 mg Oral TID WC  . chlorhexidine gluconate (MEDLINE KIT)  15 mL Mouth Rinse BID  . Chlorhexidine Gluconate Cloth  6 each Topical Daily  . docusate  100 mg Per Tube BID  . feeding supplement (PRO-STAT SUGAR FREE 64)  30 mL Per Tube Daily  . free water  300 mL Per Tube Q4H  . insulin aspart  0-9 Units Subcutaneous Q4H  . mouth rinse  15 mL Mouth Rinse q12n4p  . pantoprazole sodium  40 mg Per Tube Daily  . pneumococcal 23 valent vaccine  0.5 mL Intramuscular Tomorrow-1000  . polyethylene glycol  17 g Per Tube BID   Continuous Infusions: . sodium chloride Stopped (10/05/19 2000)  . feeding supplement (NEPRO CARB STEADY) 1,000 mL (10/15/19 1829)     LOS: 31 days    Time spent: 25 mins.    Shawna Clamp, MD [facility]   If 7PM-7AM, please contact night-coverage

## 2019-10-17 LAB — COMPREHENSIVE METABOLIC PANEL
ALT: 26 U/L (ref 0–44)
AST: 27 U/L (ref 15–41)
Albumin: 2.3 g/dL — ABNORMAL LOW (ref 3.5–5.0)
Alkaline Phosphatase: 144 U/L — ABNORMAL HIGH (ref 38–126)
Anion gap: 11 (ref 5–15)
BUN: 82 mg/dL — ABNORMAL HIGH (ref 8–23)
CO2: 24 mmol/L (ref 22–32)
Calcium: 8.2 mg/dL — ABNORMAL LOW (ref 8.9–10.3)
Chloride: 99 mmol/L (ref 98–111)
Creatinine, Ser: 4.49 mg/dL — ABNORMAL HIGH (ref 0.61–1.24)
GFR calc Af Amer: 15 mL/min — ABNORMAL LOW (ref >60)
GFR calc non Af Amer: 13 mL/min — ABNORMAL LOW (ref >60)
Glucose, Bld: 114 mg/dL — ABNORMAL HIGH (ref 70–99)
Potassium: 4.8 mmol/L (ref 3.5–5.1)
Sodium: 134 mmol/L — ABNORMAL LOW (ref 135–145)
Total Bilirubin: 0.6 mg/dL (ref 0.3–1.2)
Total Protein: 6.1 g/dL — ABNORMAL LOW (ref 6.5–8.1)

## 2019-10-17 LAB — GLUCOSE, CAPILLARY
Glucose-Capillary: 104 mg/dL — ABNORMAL HIGH (ref 70–99)
Glucose-Capillary: 104 mg/dL — ABNORMAL HIGH (ref 70–99)
Glucose-Capillary: 115 mg/dL — ABNORMAL HIGH (ref 70–99)
Glucose-Capillary: 139 mg/dL — ABNORMAL HIGH (ref 70–99)
Glucose-Capillary: 140 mg/dL — ABNORMAL HIGH (ref 70–99)
Glucose-Capillary: 96 mg/dL (ref 70–99)

## 2019-10-17 LAB — RENAL FUNCTION PANEL
Albumin: 2.2 g/dL — ABNORMAL LOW (ref 3.5–5.0)
Anion gap: 10 (ref 5–15)
BUN: 83 mg/dL — ABNORMAL HIGH (ref 8–23)
CO2: 24 mmol/L (ref 22–32)
Calcium: 8.2 mg/dL — ABNORMAL LOW (ref 8.9–10.3)
Chloride: 100 mmol/L (ref 98–111)
Creatinine, Ser: 4.29 mg/dL — ABNORMAL HIGH (ref 0.61–1.24)
GFR calc Af Amer: 16 mL/min — ABNORMAL LOW (ref >60)
GFR calc non Af Amer: 14 mL/min — ABNORMAL LOW (ref >60)
Glucose, Bld: 111 mg/dL — ABNORMAL HIGH (ref 70–99)
Phosphorus: 5.7 mg/dL — ABNORMAL HIGH (ref 2.5–4.6)
Potassium: 4.8 mmol/L (ref 3.5–5.1)
Sodium: 134 mmol/L — ABNORMAL LOW (ref 135–145)

## 2019-10-17 LAB — PHOSPHORUS: Phosphorus: 5.8 mg/dL — ABNORMAL HIGH (ref 2.5–4.6)

## 2019-10-17 LAB — MAGNESIUM: Magnesium: 2.6 mg/dL — ABNORMAL HIGH (ref 1.7–2.4)

## 2019-10-17 LAB — APTT: aPTT: 36 seconds (ref 24–36)

## 2019-10-17 NOTE — Progress Notes (Signed)
Ryan NOTE    Viral Villa  QMV:[id number] DOB: 1956/02/13 DOA: 09/15/2019 PCP: Maryella Shivers, MD   Brief Narrative:  This 64 year old male was transferred from [facility] 12/23 for acute respiratory failure, rhabdomyolysis, acute kidney injury due to services unavailable at that facility.  He was in the ICU intubated and self extubated on 12/25. Transferred out of ICU 12/27.The patient was taken for right shoulder reverse arthroplasty 1/5.Early AM, 1/8 had vomiting with probable aspiration. Placed on NRB and transferred to ICU for intubation 10/01/2019.He was weaned to extubation on 10/04/2019. He has been evaluated by SLP and has a Cortrak tube in place.  Family has met with palliative care and is hopeful that he will be able to eat so they are not yet ready for PEG or comfort care. Family made a decision about PEG tube but patient declined, Pt currently tolerating his tube feeds via Cortrak NGT well with no difficulties. Plan is to discharge to SNF   Assessment & Plan:   Principal Problem:   Closed fracture of right proximal humerus Active Problems:   Acute respiratory failure (HCC)   AKI (acute kidney injury) ([place])   Rhabdomyolysis   Acute renal failure (ARF) (HCC)   Pressure injury of skin   Protein-calorie malnutrition, severe   Encounter for feeding tube placement   Aspiration into airway   Fracture  Acute hypoxic respiratory failure from Pseudomonal HCAP.resolved. Extubated on 10/04/2019 and was transferred out of the ICU.  Completed IV meropenem x 7 day course. Speech therapy on board and is on cortak tube feeding.  Continue flutter valve.  Currently saturating 98% on room air. -aspiration precuations   Dysphagia and vomiting leading to aspiration pneumonia: Status post modified barium swallow.  Cortrak tube in place and patient has been seen by speech therapy at intervals  and recommend puree with nursing supervision but still with cortrak.    Had repeat CXR on  1/13 without active disease.   -Continue aspiration precautions.  - Patient does have a chronic dysphagia and aspiration risk as per speech therapist.  -Palliative care discussed with the family 1/22-- not wanting PEG, hopeful he will eat-- will need to revisit. -Pt currently tolerating his tube feeds via Cortrak NGT well with no difficulties. -Family has agreed for the PEG tube.  Patient declined. - Plan is SNF   Severe protein calorie malnutrition: Present on admission.  Currently on cortrak tube feeding.   Nutrition on board.  Patient has high aspiration risk and is unable to meet nutritional demands unless he has long term plans for enteral feeding.  Patient's mother unable to make a decision (?dementia) - Palliative care has met with family-- not ready for hospice yet nor a PEG tube  CKD 5: - Baseline creatinine around 5.   -No plans for HD  as per nephrology (signed off 1/15)   Hyperkalemia.  Improved. -high but flat -daily labs Kayaxalate bid x 2 doses. Recheck K  Hypernatremia:  -Improved.  Continue water flushes.    Anemia of critical illness and chronic disease:  -monitor -hold on aranesp as hgb >10  Hyperglycemia:  -SSI  Heparin-induced thrombocytopenia. -was on argatroban and switched to eliquis. .   Essential Hypertension: off medications. Closely monitor.  Right proximal humerus fracture status post reverse shoulder arthroplasty 1/5.   in Sling.     DVT prophylaxis: Eliquis Code Status: Full code Family Communication: Family met with palliative care Disposition Plan: Skilled nursing facility placement, pending improvement in nutrition Patient has chronic  dysphagia and is still on cortak tube. Long term feeding is a big issue as the patient is severely malnourished and has huge aspiration risk.  Palliative care discussed with family but not ready to make hospice yet.   Consultants:   Orthopedic surgery  Wound  care  Nephrology  PCCM  Palliative  Procedures:   Intubation x 2  ORIF of right shoulder  CorTrak tube placement  Antimicrobials:  Anti-infectives (From admission, onward)   Start     Dose/Rate Route Frequency Ordered Stop   10/04/19 1230  meropenem (MERREM) 500 mg in sodium chloride 0.9 % 100 mL IVPB     500 mg 200 mL/hr over 30 Minutes Intravenous Every 24 hours 10/04/19 1213 10/10/19 1231   10/04/19 1000  ceFEPIme (MAXIPIME) 1 g in sodium chloride 0.9 % 100 mL IVPB  Status:  Discontinued     1 g 200 mL/hr over 30 Minutes Intravenous Every 24 hours 10/03/19 0921 10/04/19 1213   10/03/19 0930  ceFEPIme (MAXIPIME) 1 g in sodium chloride 0.9 % 100 mL IVPB     1 g 200 mL/hr over 30 Minutes Intravenous STAT 10/03/19 0921 10/03/19 1100   10/03/19 0800  cefTAZidime (FORTAZ) 0.5 g in dextrose 5 % 50 mL IVPB  Status:  Discontinued     0.5 g 100 mL/hr over 30 Minutes Intravenous Every 24 hours 10/03/19 0705 10/03/19 0921   10/01/19 0800  Ampicillin-Sulbactam (UNASYN) 3 g in sodium chloride 0.9 % 100 mL IVPB  Status:  Discontinued     3 g 200 mL/hr over 30 Minutes Intravenous Every 12 hours 10/01/19 0640 10/03/19 0655   09/28/19 2030  ceFAZolin (ANCEF) IVPB 1 g/50 mL premix     1 g 100 mL/hr over 30 Minutes Intravenous Every 12 hours 09/28/19 1624 09/29/19 1910   09/28/19 1157  vancomycin (VANCOCIN) powder  Status:  Discontinued       As needed 09/28/19 1158 09/28/19 1457   09/28/19 0600  vancomycin (VANCOCIN) IVPB 1000 mg/200 mL premix  Status:  Discontinued     1,000 mg 200 mL/hr over 60 Minutes Intravenous On call to O.R. 09/27/19 1159 09/28/19 1607   09/28/19 0000  ceFAZolin (ANCEF) IVPB 2g/100 mL premix    Note to Pharmacy: Surgery delayed until 1/5.   2 g 200 mL/hr over 30 Minutes Intravenous To Surgery 09/22/19 0904 09/28/19 1222   09/22/19 0815  ceFAZolin (ANCEF) IVPB 2g/100 mL premix  Status:  Discontinued     2 g 200 mL/hr over 30 Minutes Intravenous To Surgery  09/22/19 0806 09/22/19 0905   09/15/19 1715  azithromycin (ZITHROMAX) 500 mg in sodium chloride 0.9 % 250 mL IVPB  Status:  Discontinued     500 mg 250 mL/hr over 60 Minutes Intravenous Every 24 hours 09/15/19 1711 09/18/19 1107   09/15/19 1715  cefTRIAXone (ROCEPHIN) 1 g in sodium chloride 0.9 % 100 mL IVPB     1 g 200 mL/hr over 30 Minutes Intravenous Every 24 hours 09/15/19 1711 09/21/19 1754     Subjective: Patient was seen and examined at bedside.  He is more alert, awake, appears comfortable,  watching television. Declines to have a PEG tube. He has big bowel movement today.  Objective: Vitals:   10/16/19 2330 10/17/19 0325 10/17/19 0759 10/17/19 1230  BP: (!) 141/64 (!) 125/46 (!) 148/83 (!) 170/73  Pulse: 66 61 71 65  Resp: '18 18 15 18  ' Temp: 97.7 F (36.5 C) 98.4 F (36.9 C) 98.3 F (36.8  C) 98.3 F (36.8 C)  TempSrc: Oral Oral Oral Oral  SpO2: 97% 96% 94% 100%  Weight:      Height:        Intake/Output Summary (Last 24 hours) at 10/17/2019 1250 Last data filed at 10/17/2019 5883 Gross per 24 hour  Intake --  Output 2225 ml  Net -2225 ml   Filed Weights   10/13/19 0324 10/14/19 0325 10/14/19 2041  Weight: 67.5 kg 68.7 kg 69 kg    Examination:  General exam: Appears calm and comfortable, Cortak tube in nose. Respiratory system: Clear to auscultation. Respiratory effort normal. Cardiovascular system: S1 & S2 heard, RRR. No JVD, murmurs, rubs, gallops or clicks. No pedal edema. Gastrointestinal system: Abdomen is nondistended, soft and nontender. No organomegaly or masses felt. Normal bowel sounds heard. Central nervous system: Alert and oriented. No focal neurological deficits. Extremities:  No edema, no swelling. Skin: No rashes, lesions or ulcers Psychiatry: Judgement and insight appear normal. Mood & affect appropriate.     Data Reviewed: I have personally reviewed following labs and imaging studies  CBC: Recent Labs  Lab 10/12/19 0354  10/13/19 0214 10/14/19 0418 10/15/19 0733 10/16/19 0335  WBC 7.4 7.9 6.9 7.4 7.2  HGB 10.5* 10.3* 10.1* 10.7* 11.2*  HCT 34.1* 33.3* 32.9* 34.1* 35.4*  MCV 100.6* 99.7 98.8 96.6 96.7  PLT 265 309 318 322 254   Basic Metabolic Panel: Recent Labs  Lab 10/13/19 0214 10/13/19 0214 10/14/19 0418 10/15/19 0733 10/15/19 0931 10/16/19 0335 10/17/19 0504  NA 136  --  137 134*  --  133* 134*  134*  K 5.5*   < > 5.4* 5.5* 5.7* 4.9 4.8  4.8  CL 105  --  106 105  --  100 100  99  CO2 22  --  21* 21*  --  '24 24  24  ' GLUCOSE 110*  --  112* 120*  --  109* 111*  114*  BUN 73*  --  75* 77*  --  79* 83*  82*  CREATININE 5.04*  --  4.67* 4.71*  --  4.37* 4.29*  4.49*  CALCIUM 7.9*  --  8.1* 8.1*  --  7.8* 8.2*  8.2*  MG  --   --   --   --   --  2.5* 2.6*  PHOS 6.3*  --  5.9* 5.7*  --  6.0* 5.7*  5.8*   < > = values in this interval not displayed.   GFR: Estimated Creatinine Clearance: 17.1 mL/min (A) (by C-G formula based on SCr of 4.29 mg/dL (H)). Liver Function Tests: Recent Labs  Lab 10/13/19 0214 10/14/19 0418 10/15/19 0733 10/16/19 0335 10/17/19 0504  AST  --   --   --  29 27  ALT  --   --   --  27 26  ALKPHOS  --   --   --  142* 144*  BILITOT  --   --   --  0.6 0.6  PROT  --   --   --  6.1* 6.1*  ALBUMIN 1.9* 2.1* 2.1* 2.2* 2.2*  2.3*   No results for input(s): LIPASE, AMYLASE in the last 168 hours. No results for input(s): AMMONIA in the last 168 hours. Coagulation Profile: No results for input(s): INR, PROTIME in the last 168 hours. Cardiac Enzymes: No results for input(s): CKTOTAL, CKMB, CKMBINDEX, TROPONINI in the last 168 hours. BNP (last 3 results) No results for input(s): PROBNP in the last 8760 hours. HbA1C:  No results for input(s): HGBA1C in the last 72 hours. CBG: Recent Labs  Lab 10/16/19 1924 10/16/19 2326 10/17/19 0321 10/17/19 0801 10/17/19 1232  GLUCAP 103* 104* 104* 139* 140*   Lipid Profile: No results for input(s): CHOL, HDL,  LDLCALC, TRIG, CHOLHDL, LDLDIRECT in the last 72 hours. Thyroid Function Tests: No results for input(s): TSH, T4TOTAL, FREET4, T3FREE, THYROIDAB in the last 72 hours. Anemia Panel: No results for input(s): VITAMINB12, FOLATE, FERRITIN, TIBC, IRON, RETICCTPCT in the last 72 hours. Sepsis Labs: No results for input(s): PROCALCITON, LATICACIDVEN in the last 168 hours.  No results found for this or any previous visit (from the past 240 hour(s)).    Radiology Studies: No results found.  Scheduled Meds: . apixaban  5 mg Per Tube BID  . calcium acetate  667 mg Oral TID WC  . chlorhexidine gluconate (MEDLINE KIT)  15 mL Mouth Rinse BID  . Chlorhexidine Gluconate Cloth  6 each Topical Daily  . docusate  100 mg Per Tube BID  . feeding supplement (PRO-STAT SUGAR FREE 64)  30 mL Per Tube Daily  . free water  300 mL Per Tube Q4H  . insulin aspart  0-9 Units Subcutaneous Q4H  . mouth rinse  15 mL Mouth Rinse q12n4p  . pantoprazole sodium  40 mg Per Tube Daily  . pneumococcal 23 valent vaccine  0.5 mL Intramuscular Tomorrow-1000  . polyethylene glycol  17 g Per Tube BID   Continuous Infusions: . sodium chloride Stopped (10/05/19 2000)  . feeding supplement (NEPRO CARB STEADY) 1,000 mL (10/15/19 1829)     LOS: 32 days    Time spent: 25 mins.    Shawna Clamp, MD [facility]   If 7PM-7AM, please contact night-coverage

## 2019-10-18 DIAGNOSIS — E875 Hyperkalemia: Secondary | ICD-10-CM

## 2019-10-18 LAB — GLUCOSE, CAPILLARY
Glucose-Capillary: 108 mg/dL — ABNORMAL HIGH (ref 70–99)
Glucose-Capillary: 109 mg/dL — ABNORMAL HIGH (ref 70–99)
Glucose-Capillary: 121 mg/dL — ABNORMAL HIGH (ref 70–99)
Glucose-Capillary: 111 mg/dL — ABNORMAL HIGH (ref 70–99)
Glucose-Capillary: 109 mg/dL — ABNORMAL HIGH (ref 70–99)

## 2019-10-18 LAB — RENAL FUNCTION PANEL
Albumin: 2.2 g/dL — ABNORMAL LOW (ref 3.5–5.0)
Anion gap: 12 (ref 5–15)
BUN: 86 mg/dL — ABNORMAL HIGH (ref 8–23)
CO2: 24 mmol/L (ref 22–32)
Calcium: 8.3 mg/dL — ABNORMAL LOW (ref 8.9–10.3)
Chloride: 98 mmol/L (ref 98–111)
Creatinine, Ser: 4.59 mg/dL — ABNORMAL HIGH (ref 0.61–1.24)
GFR calc Af Amer: 15 mL/min — ABNORMAL LOW (ref >60)
GFR calc non Af Amer: 13 mL/min — ABNORMAL LOW (ref >60)
Glucose, Bld: 118 mg/dL — ABNORMAL HIGH (ref 70–99)
Phosphorus: 5.7 mg/dL — ABNORMAL HIGH (ref 2.5–4.6)
Potassium: 5.1 mmol/L (ref 3.5–5.1)
Sodium: 134 mmol/L — ABNORMAL LOW (ref 135–145)

## 2019-10-18 LAB — APTT: aPTT: 33 seconds (ref 24–36)

## 2019-10-18 LAB — MAGNESIUM: Magnesium: 2.7 mg/dL — ABNORMAL HIGH (ref 1.7–2.4)

## 2019-10-18 NOTE — Progress Notes (Signed)
Physical Therapy Treatment Patient Details Name: Ryan Villa MRN: [id number] DOB: 1956-02-13 Today's Date: 10/18/2019    History of Present Illness Pt adm with rt humeral fx after fall at home and in the floor x 3 days. Pt intubated 12/23 and self extubated 12/25. Pt with acute metabolic encephalopathy and delirium, PMH -R BKA, ckd, dm, htn, intracerebral hemorrhage. Pt now s/p R TSA 1/5. Pt re-intubated 1/8 due to suspected aspiration PNA.    PT Comments    Pt performed gt training with newly fitted prosthesis.  He continues to required moderate assistance to mobilize but he was able to progress to gt training.  He did ambulate with close watch of scab on weight bearing surface.  No skin changes noted  Post gt training.  Pt continues to benefit from skilled rehab in a post acute setting before returning home.      Follow Up Recommendations  SNF     Equipment Recommendations  None recommended by PT    Recommendations for Other Services       Precautions / Restrictions Precautions Precautions: Fall;Shoulder Type of Shoulder Precautions: okay to WB with transfers, otherwise no lifting; sling on when not ambulating or doing BADLs, okay for pendulums; no abduction; ER: 30 degrees; no AROM; okay hand wrist and elbow AROM Shoulder Interventions: Shoulder sling/immobilizer;For comfort Precaution Comments: R humerus ORIF 1/5, RUE flaccid on 1/15 Required Braces or Orthoses: Sling;Other Brace Restrictions Weight Bearing Restrictions: No RUE Weight Bearing: Non weight bearing Other Position/Activity Restrictions: able to WB with RW with transfers    Check skin integrity before donning prosthesis.     Mobility  Bed Mobility Overal bed mobility: Needs Assistance Bed Mobility: Rolling Rolling: Min assist Sidelying to sit: Mod assist   Sit to supine: Supervision   General bed mobility comments: assist to bring LEs to EOB, mod A for balance/support while seated to adjust R UE sling To  return to bed no assistance needed but once in supine VCs provided to adjust position in bed.  Transfers Overall transfer level: Needs assistance Equipment used: 1 person hand held assist(side by side) Transfers: Sit to/from Stand Sit to Stand: Min assist(mod to maintain once in standing.)         General transfer comment: Pt required assistance to achieve standing.  Once in standing with R prosthesis he required increased assistance to avoid LOB.  Presents with posterior lean.  Ambulation/Gait Ambulation/Gait assistance: Mod assist Gait Distance (Feet): 40 Feet Assistive device: 1 person hand held assist(side by side) Gait Pattern/deviations: Narrow base of support     General Gait Details: L lateral lean with poor proprioception.  His prosthesis fit better and no changes to skin during use but did remove device due to scab on weight bearing surface.   Stairs             Wheelchair Mobility    Modified Rankin (Stroke Patients Only)       Balance Overall balance assessment: Needs assistance Sitting-balance support: Single extremity supported;No upper extremity supported;Feet supported Sitting balance-Leahy Scale: Poor       Standing balance-Leahy Scale: Poor Standing balance comment: External assistance to remain standing.                            Cognition Arousal/Alertness: Awake/alert Behavior During Therapy: Flat affect Overall Cognitive Status: No family/caregiver present to determine baseline cognitive functioning Area of Impairment: Orientation;Attention;Memory;Following commands;Safety/judgement;Awareness;Problem solving  Orientation Level: Time;Situation Current Attention Level: Selective Memory: Decreased recall of precautions;Decreased short-term memory Following Commands: Follows one step commands with increased time Safety/Judgement: Decreased awareness of safety Awareness: Intellectual Problem Solving:  Requires verbal cues;Requires tactile cues;Slow processing General Comments: pt easily distracted, very focused on the remote.      Exercises      General Comments        Pertinent Vitals/Pain Pain Assessment: No/denies pain Faces Pain Scale: No hurt Pain Location: not responding to painful stimuli this date Pain Descriptors / Indicators: Grimacing;Guarding Pain Intervention(s): Monitored during session;Repositioned    Home Living                      Prior Function            PT Goals (current goals can now be found in the care plan section) Acute Rehab PT Goals Patient Stated Goal: none stated Potential to Achieve Goals: Fair Progress towards PT goals: Progressing toward goals    Frequency    Min 2X/week      PT Plan Current plan remains appropriate    Co-evaluation              AM-PAC PT "6 Clicks" Mobility   Outcome Measure  Help needed turning from your back to your side while in a flat bed without using bedrails?: A Lot Help needed moving from lying on your back to sitting on the side of a flat bed without using bedrails?: A Lot Help needed moving to and from a bed to a chair (including a wheelchair)?: A Lot Help needed standing up from a chair using your arms (e.g., wheelchair or bedside chair)?: A Lot Help needed to walk in hospital room?: A Lot Help needed climbing 3-5 steps with a railing? : A Lot 6 Click Score: 12    End of Session Equipment Utilized During Treatment: Gait belt Activity Tolerance: Patient tolerated treatment well Patient left: in chair;with call bell/phone within reach;with chair alarm set Nurse Communication: Mobility status PT Visit Diagnosis: Other abnormalities of gait and mobility (R26.89);Muscle weakness (generalized) (M62.81);Pain Pain - Right/Left: Right Pain - part of body: Shoulder     Time: [id number] PT Time Calculation (min) (ACUTE ONLY): 21 min  Charges:  $Gait Training: 8-22 mins                      Erasmo Leventhal , PTA Acute Rehabilitation Services Pager [phone] Office [phone]     Kayren Holck Eli Hose 10/18/2019, 5:34 PM

## 2019-10-18 NOTE — Progress Notes (Signed)
  Speech Language Pathology Treatment: Dysphagia  Patient Details Name: Ryan Villa MRN: [id number] DOB: [birth year] Today's Date: 10/18/2019 Time: [id number] SLP Time Calculation (min) (ACUTE ONLY): 30 min  Assessment / Plan / Recommendation Clinical Impression  Pt seen at bedside for skilled ST intervention targeting goals for adherence to safe swallow precautions. Pt tolerated oral care with suction, but has difficulty following commands to cough vs throat clear. Congested nonproductive cough noted prior to PO trials. Pt accepted small boluses of pudding and honey thick liquids via teaspoon. Congested cough response noted. Intermittent wet voice quality also noted as pt requested "more".  SLP engaged pt in discussion about his dysphagia, and the recently refused PEG tube placement. Pt indicated he doesn't need the PEG tube, because he has no difficulty swallowing. Pt reported the cough response following po trials today (pudding and honey thick liquid) was because he has a cold. Pt was easily irritated today. He disagreed with SLP stating the aspiration seen on previous MBS's was because he "did it wrong", and would not hear of the problem being due to multiple intubations and deconditioning.   Recommend repeating MBS to determine current oropharyngeal swallow safety/function, improvement, and appropriateness for po intake in light of PEG tube refusal.    HPI HPI: 64 year old male transferred from [facility] for acute respiratory failure, rhabdomyolysis (fell and laid on floor 3 days), acute kidney injury. PMH: CKD stage IV, diabetes mellitus type 2, hyperlipidemia, hypertension, left carotid stenosis, intracerebral hemorrhage, status post right BKA. Per chart EMS reported that the patient's sister was bringing him food while he was lying on the floor during that 3-day period. Intubated 12/23-12/24 (self extubated). Found to have right humeral neck fracture and right upper lobe consolidation  per chest x-ray, head CT no acute intracranial process,   Pt underwent MBS 12/28 with findings of severe dysphagia - asp of nectar/honey thick.  Repeat MBS 1/2 recommended Dys 1 diet and nectar thick liquids with chin tuck. On 1/8 pt had vomiting and suspected aspiration requiring reintubation; extubated 1/11. Repeat MBS 10/06/19.      SLP Plan  MBS;Continue with current plan of care       Recommendations  Diet recommendations: NPO Medication Administration: Via alternative means                Oral Care Recommendations: Oral care QID Follow up Recommendations: Skilled Nursing facility SLP Visit Diagnosis: Dysphagia, oropharyngeal phase (R13.12) Plan: MBS;Continue with current plan of care       GO              Raja Caputi B. Quentin Ore, [facility], [place] Speech Language Pathologist Office: [phone] Pager: [phone]  Shonna Chock 10/18/2019, 12:56 PM

## 2019-10-18 NOTE — Progress Notes (Signed)
.  PROGRESS NOTE    Ryan Villa  MRN:[id number] DOB: 05/27/1956 DOA: 09/15/2019 PCP: Hodges, Francisco, MD   Brief Narrative:   This 63-year-old male was transferred from [facility] 12/23 for acute respiratory failure, rhabdomyolysis, acute kidney injury due to services unavailable at that facility. He was in the ICU intubated and self extubated on 12/25. Transferred out of ICU 12/27.The patient was taken for right shoulder reverse arthroplasty 1/5.Early AM, 1/8 had vomiting with probable aspiration. Placed on NRB and transferred to ICU for intubation 10/01/2019.He was weaned to extubation on 10/04/2019. He has been evaluated by SLP and has a Cortrak tube in place. Family has met with palliative care and is hopeful that he will be able to eat so they are not yet ready for PEG or comfort care. Family made a decision about PEG tube but patient declined, Pt currently tolerating his tube feeds via Cortrak NGT well with no difficulties. Plan is to discharge to SNF  10/18/19: Denies complaints this AM. Reaffirms that he does not want a PEG. SLP still working with him. Will not be able to place until we answer PEG vs pleasure feeds/DNR question.    Assessment & Plan:   Principal Problem:   Closed fracture of right proximal humerus Active Problems:   Acute respiratory failure (HCC)   AKI (acute kidney injury) (HCC)   Rhabdomyolysis   Acute renal failure (ARF) (HCC)   Pressure injury of skin   Protein-calorie malnutrition, severe   Encounter for feeding tube placement   Aspiration into airway   Fracture  Acute hypoxic respiratory failure Pseudomonal HCAP Dysphagiaand vomiting leading to aspiration pneumonia     - resolved     - Extubated on 10/04/2019 and was transferred out of the ICU.      - Completed IV meropenem x 7 day course.      - Speech therapy on board and is on cortak tube feeding.      - Continue flutter valve.      - aspiration precuations     - Patient does have  a chronic dysphagia and aspiration risk as per speech therapist.     - pt is adament that he doesn't want a EPG     - Palliative care discussed with the family 1/22; not wanting PEG, hopeful he will eat  Severe protein calorie malnutrition     - Present on admission.      - Currently on cortrak tube feeding.      - Nutrition on board.      - Patient has high aspiration risk and is unable to meet nutritional demands unless he has long term plans for enteral feeding. Patient's mother unable to make a decision (?dementia)     - Palliative care has met with family-- not ready for hospice yetnor a PEG tube  CKD 5:     - Baseline creatinine around 5.      - No plans for HD as per nephrology (signed off 1/15)   Hyperkalemia Hypernatremia     - stable, monitor  Anemia of critical illness and chronic disease:      - monitor     - hold on aranesp as hgb >10  Hyperglycemia:     - SSI  Heparin-induced thrombocytopenia.     - was on argatroban and switched to eliquis.  Essential Hypertension     - labile, off meds, monitor  Right proximal humerus fracture      - s/p reverse   shoulder arthroplasty 1/5.      - in Sling; stable   DVT prophylaxis: eliquis Code Status: FULL Family Communication: None at bedside   Disposition Plan: Need to make a decision on PEG.  Consultants:   Orthopedics  Nephrology  PCCM  Palliative Care  Procedures:   Intubation  ORIF r shoulder  Cortrak  ROS:  Denies CP, ab pain, dyspnea, HA . Remainder 10-pt ROS is negative for all not previously mentioned.  Subjective: "No."  Objective: Vitals:   10/17/19 2345 10/18/19 0329 10/18/19 0804 10/18/19 1152  BP: (!) 90/58 (!) 92/56 (!) 143/82 139/82  Pulse: 64 66 67 63  Resp: _0 Temp: 98.1 F (36.7 C) 98.7 F (37.1 C) 97.7 F (36.5 C) 97.8 F (36.6 C)  TempSrc:  Oral Oral Oral  SpO2: 98% 98% 97% 99%  Weight:      Height:        Intake/Output Summary (Last  24 hours) at 10/18/2019 1412 Last data filed at 10/18/2019 1103 Gross per 24 hour  Intake --  Output 1900 ml  Net -1900 ml   Filed Weights   10/13/19 0324 10/14/19 0325 10/14/19 2041  Weight: 67.5 kg 68.7 kg 69 kg    Examination:  General: 64 y.o. male resting in bed in NAD Cardiovascular: RRR, +S1, S2, no m/g/r, equal pulses throughout Respiratory: CTABL, no w/r/r, normal WOB GI: BS+, NDNT, no masses noted, no organomegaly noted, NGT in place MSK: No e/c/c Neuro: alert, follows commands Psyc: Appropriate interaction and affect, calm/cooperative   Data Reviewed: I have personally reviewed following labs and imaging studies.  CBC: Recent Labs  Lab 10/12/19 0354 10/13/19 0214 10/14/19 0418 10/15/19 0733 10/16/19 0335  WBC 7.4 7.9 6.9 7.4 7.2  HGB 10.5* 10.3* 10.1* 10.7* 11.2*  HCT 34.1* 33.3* 32.9* 34.1* 35.4*  MCV 100.6* 99.7 98.8 96.6 96.7  PLT 265 309 318 322 413   Basic Metabolic Panel: Recent Labs  Lab 10/14/19 0418 10/14/19 0418 10/15/19 0733 10/15/19 0931 10/16/19 0335 10/17/19 0504 10/18/19 0220  NA 137  --  134*  --  133* 134*  134* 134*  K 5.4*   < > 5.5* 5.7* 4.9 4.8  4.8 5.1  CL 106  --  105  --  100 100  99 98  CO2 21*  --  21*  --  _1 GLUCOSE 112*  --  120*  --  109* 111*  114* 118*  BUN 75*  --  77*  --  79* 83*  82* 86*  CREATININE 4.67*  --  4.71*  --  4.37* 4.29*  4.49* 4.59*  CALCIUM 8.1*  --  8.1*  --  7.8* 8.2*  8.2* 8.3*  MG  --   --   --   --  2.5* 2.6* 2.7*  PHOS 5.9*  --  5.7*  --  6.0* 5.7*  5.8* 5.7*   < > = values in this interval not displayed.   GFR: Estimated Creatinine Clearance: 15.9 mL/min (A) (by C-G formula based on SCr of 4.59 mg/dL (H)). Liver Function Tests: Recent Labs  Lab 10/14/19 0418 10/15/19 0733 10/16/19 0335 10/17/19 0504 10/18/19 0220  AST  --   --  29 27  --   ALT  --   --  27 26  --   ALKPHOS  --   --  142* 144*  --   BILITOT  --   --  0.6 0.6  --  PROT  --   --  6.1* 6.1*  --    ALBUMIN 2.1* 2.1* 2.2* 2.2*  2.3* 2.2*   No results for input(s): LIPASE, AMYLASE in the last 168 hours. No results for input(s): AMMONIA in the last 168 hours. Coagulation Profile: No results for input(s): INR, PROTIME in the last 168 hours. Cardiac Enzymes: No results for input(s): CKTOTAL, CKMB, CKMBINDEX, TROPONINI in the last 168 hours. BNP (last 3 results) No results for input(s): PROBNP in the last 8760 hours. HbA1C: No results for input(s): HGBA1C in the last 72 hours. CBG: Recent Labs  Lab 10/17/19 1943 10/17/19 2342 10/18/19 0326 10/18/19 0801 10/18/19 1151  GLUCAP 104* 115* 108* 109* 121*   Lipid Profile: No results for input(s): CHOL, HDL, LDLCALC, TRIG, CHOLHDL, LDLDIRECT in the last 72 hours. Thyroid Function Tests: No results for input(s): TSH, T4TOTAL, FREET4, T3FREE, THYROIDAB in the last 72 hours. Anemia Panel: No results for input(s): VITAMINB12, FOLATE, FERRITIN, TIBC, IRON, RETICCTPCT in the last 72 hours. Sepsis Labs: No results for input(s): PROCALCITON, LATICACIDVEN in the last 168 hours.  No results found for this or any previous visit (from the past 240 hour(s)).    Radiology Studies: No results found.   Scheduled Meds: . apixaban  5 mg Per Tube BID  . chlorhexidine gluconate (MEDLINE KIT)  15 mL Mouth Rinse BID  . Chlorhexidine Gluconate Cloth  6 each Topical Daily  . docusate  100 mg Per Tube BID  . feeding supplement (PRO-STAT SUGAR FREE 64)  30 mL Per Tube Daily  . free water  300 mL Per Tube Q4H  . insulin aspart  0-9 Units Subcutaneous Q4H  . mouth rinse  15 mL Mouth Rinse q12n4p  . pantoprazole sodium  40 mg Per Tube Daily  . pneumococcal 23 valent vaccine  0.5 mL Intramuscular Tomorrow-1000  . polyethylene glycol  17 g Per Tube BID   Continuous Infusions: . sodium chloride Stopped (10/05/19 2000)  . feeding supplement (NEPRO CARB STEADY) 1,000 mL (10/15/19 1829)     LOS: 33 days    Time spent: 25 minutes spent in the  coordination of care today.    Jonnie Finner, DO [facility]  If 7PM-7AM, please contact night-coverage [email or web address] 10/18/2019, 2:12 PM

## 2019-10-19 ENCOUNTER — Encounter (HOSPITAL_COMMUNITY): Payer: Self-pay | Admitting: Pulmonary Disease

## 2019-10-19 LAB — CBC WITH DIFFERENTIAL/PLATELET
Abs Immature Granulocytes: 0.01 10*3/uL (ref 0.00–0.07)
Basophils Absolute: 0.1 10*3/uL (ref 0.0–0.1)
Basophils Relative: 1 %
Eosinophils Absolute: 0.1 10*3/uL (ref 0.0–0.5)
Eosinophils Relative: 2 %
HCT: 37.4 % — ABNORMAL LOW (ref 39.0–52.0)
Hemoglobin: 11.7 g/dL — ABNORMAL LOW (ref 13.0–17.0)
Immature Granulocytes: 0 %
Lymphocytes Relative: 29 %
Lymphs Abs: 1.8 10*3/uL (ref 0.7–4.0)
MCH: 30.2 pg (ref 26.0–34.0)
MCHC: 31.3 g/dL (ref 30.0–36.0)
MCV: 96.4 fL (ref 80.0–100.0)
Monocytes Absolute: 0.7 10*3/uL (ref 0.1–1.0)
Monocytes Relative: 12 %
Neutro Abs: 3.5 10*3/uL (ref 1.7–7.7)
Neutrophils Relative %: 56 %
Platelets: 291 10*3/uL (ref 150–400)
RBC: 3.88 MIL/uL — ABNORMAL LOW (ref 4.22–5.81)
RDW: 14.7 % (ref 11.5–15.5)
WBC: 6.2 10*3/uL (ref 4.0–10.5)
nRBC: 0 % (ref 0.0–0.2)

## 2019-10-19 LAB — RENAL FUNCTION PANEL
Albumin: 2.3 g/dL — ABNORMAL LOW (ref 3.5–5.0)
Anion gap: 11 (ref 5–15)
BUN: 88 mg/dL — ABNORMAL HIGH (ref 8–23)
CO2: 25 mmol/L (ref 22–32)
Calcium: 8.4 mg/dL — ABNORMAL LOW (ref 8.9–10.3)
Chloride: 100 mmol/L (ref 98–111)
Creatinine, Ser: 4.63 mg/dL — ABNORMAL HIGH (ref 0.61–1.24)
GFR calc Af Amer: 14 mL/min — ABNORMAL LOW (ref >60)
GFR calc non Af Amer: 13 mL/min — ABNORMAL LOW (ref >60)
Glucose, Bld: 108 mg/dL — ABNORMAL HIGH (ref 70–99)
Phosphorus: 5.5 mg/dL — ABNORMAL HIGH (ref 2.5–4.6)
Potassium: 4.7 mmol/L (ref 3.5–5.1)
Sodium: 136 mmol/L (ref 135–145)

## 2019-10-19 LAB — GLUCOSE, CAPILLARY
Glucose-Capillary: 102 mg/dL — ABNORMAL HIGH (ref 70–99)
Glucose-Capillary: 106 mg/dL — ABNORMAL HIGH (ref 70–99)
Glucose-Capillary: 109 mg/dL — ABNORMAL HIGH (ref 70–99)
Glucose-Capillary: 130 mg/dL — ABNORMAL HIGH (ref 70–99)
Glucose-Capillary: 131 mg/dL — ABNORMAL HIGH (ref 70–99)
Glucose-Capillary: 78 mg/dL (ref 70–99)
Glucose-Capillary: 87 mg/dL (ref 70–99)

## 2019-10-19 LAB — APTT: aPTT: 36 seconds (ref 24–36)

## 2019-10-19 LAB — MAGNESIUM: Magnesium: 2.7 mg/dL — ABNORMAL HIGH (ref 1.7–2.4)

## 2019-10-19 NOTE — Progress Notes (Signed)
Ryan Villa  PROGRESS NOTE    Ryan Villa  FOY:[id number] DOB: [birth year] DOA: 09/15/2019 PCP: Maryella Shivers, MD   Brief Narrative:   This 64 year old male was transferred from [facility] 12/23 for acute respiratory failure, rhabdomyolysis, acute kidney injury due to services unavailable at that facility. He was in the ICU intubated and self extubated on 12/25. Transferred out of ICU 12/27.The patient was taken for right shoulder reverse arthroplasty 1/5.Early AM, 1/8 had vomiting with probable aspiration. Placed on NRB and transferred to ICU for intubation 10/01/2019.He was weaned to extubation on 10/04/2019. He has been evaluated by SLP and has a Cortrak tube in place. Family has met with palliative care and is hopeful that he will be able to eat so they are not yet ready for PEG or comfort care. Family made a decision about PEG tube but patient declined, Pt currently tolerating his tube feeds via Cortrak NGT well with no difficulties. Plan is to discharge to SNF  10/19/19: Denies complaints this AM. SLP to perform MBS. Can make final determination of long term nutrition after that. PT rec'ing SNF   Assessment & Plan:   Principal Problem:   Closed fracture of right proximal humerus Active Problems:   Acute respiratory failure (HCC)   AKI (acute kidney injury) (HCC)   Rhabdomyolysis   Acute renal failure (ARF) (HCC)   Pressure injury of skin   Protein-calorie malnutrition, severe   Encounter for feeding tube placement   Aspiration into airway   Fracture  Acute hypoxic respiratory failure Pseudomonal HCAP Dysphagiaand vomiting leading to aspiration pneumonia     - resolved     - Extubated on 10/04/2019 and was transferred out of the ICU.      - Completed IV meropenem x 7 day course.      - Speech therapy on board and is on cortak tube feeding.      - Continue flutter valve.      - aspiration precuations     - Patient does have a chronic dysphagia and aspiration risk as per  speech therapist.     - pt is adament that he doesn't want a EPG     - Palliative care discussed with the family 1/22; not wanting PEG, hopeful he will eat     - 10/19/19: Will get MBS w/ SLP; hopefully this will have a positive result and we can have this long term nutrition question answered.  Severe protein calorie malnutrition     - Present on admission.      - Currently on cortrak tube feeding.      - Nutrition on board.      - Patient has high aspiration risk and is unable to meet nutritional demands unless he has long term plans for enteral feeding. Patient's mother unable to make a decision (?dementia)     - Palliative care has met with family-- not ready for hospice yetnor a PEG tube     - see above  CKD 5:     - Baseline creatinine around 5.      - No plans for HD as per nephrology (signed off 1/15)   Hyperkalemia Hypernatremia     - stable, monitor  Anemia of critical illness and chronic disease:      - monitor     - hold on aranesp as hgb >10  Hyperglycemia:     - SSI  Heparin-induced thrombocytopenia.     - was on argatroban and switched to eliquis.  Essential Hypertension     - labile, off meds, monitor  Right proximal humerus fracture      - s/p reverse shoulder arthroplasty 1/5.      - in Sling; stable   DVT prophylaxis: eliquis Code Status: FULL Family Communication: None at bedside   Disposition Plan: MBS. Follow results. If still needing alternate means of nutrition, need to make final decision on PEG vs pleasure feeds/DNR.  Consultants:   Orthopedics  Nephrology  PCCM  Palliative Care  Procedures:   Intubation  ORIF R shoulder  ROS:  Denies CP, ab pain, dyspnea, HA. Remainder 10-pt ROS is negative for all not previously mentioned.  Subjective: "Uh-huh."  Objective: Vitals:   10/19/19 0001 10/19/19 0333 10/19/19 0337 10/19/19 0754  BP: 139/71 117/69  (!) 93/55  Pulse: 65 66  68  Resp: '17 18  16  ' Temp: 97.9 F  (36.6 C) (!) 97.5 F (36.4 C)  98 F (36.7 C)  TempSrc: Oral Oral  Oral  SpO2: 94% 97%  97%  Weight:   62.2 kg   Height:        Intake/Output Summary (Last 24 hours) at 10/19/2019 1125 Last data filed at 10/19/2019 7616 Gross per 24 hour  Intake 3488 ml  Output 1400 ml  Net 2088 ml   Filed Weights   10/14/19 0325 10/14/19 2041 10/19/19 0337  Weight: 68.7 kg 69 kg 62.2 kg    Examination:  General: 64 y.o. male resting in bed in NAD Cardiovascular: RRR, +S1, S2, no m/g/r Respiratory: CTABL, no w/r/r, normal WOB GI: BS+, NDNT, NGT in place, soft MSK: No e/c/c Neuro: alert to name, follows commands Psyc: Appropriate interaction and affect, calm/cooperative   Data Reviewed: I have personally reviewed following labs and imaging studies.  CBC: Recent Labs  Lab 10/13/19 0214 10/14/19 0418 10/15/19 0733 10/16/19 0335 10/19/19 0233  WBC 7.9 6.9 7.4 7.2 6.2  NEUTROABS  --   --   --   --  3.5  HGB 10.3* 10.1* 10.7* 11.2* 11.7*  HCT 33.3* 32.9* 34.1* 35.4* 37.4*  MCV 99.7 98.8 96.6 96.7 96.4  PLT 309 318 322 326 073   Basic Metabolic Panel: Recent Labs  Lab 10/15/19 0733 10/15/19 0733 10/15/19 0931 10/16/19 0335 10/17/19 0504 10/18/19 0220 10/19/19 0233  NA 134*  --   --  133* 134*  134* 134* 136  K 5.5*   < > 5.7* 4.9 4.8  4.8 5.1 4.7  CL 105  --   --  100 100  99 98 100  CO2 21*  --   --  '24 24  24 24 25  ' GLUCOSE 120*  --   --  109* 111*  114* 118* 108*  BUN 77*  --   --  79* 83*  82* 86* 88*  CREATININE 4.71*  --   --  4.37* 4.29*  4.49* 4.59* 4.63*  CALCIUM 8.1*  --   --  7.8* 8.2*  8.2* 8.3* 8.4*  MG  --   --   --  2.5* 2.6* 2.7* 2.7*  PHOS 5.7*  --   --  6.0* 5.7*  5.8* 5.7* 5.5*   < > = values in this interval not displayed.   GFR: Estimated Creatinine Clearance: 14.4 mL/min (A) (by C-G formula based on SCr of 4.63 mg/dL (H)). Liver Function Tests: Recent Labs  Lab 10/15/19 0733 10/16/19 0335 10/17/19 0504 10/18/19 0220 10/19/19 0233   AST  --  29 27  --   --  ALT  --  27 26  --   --   ALKPHOS  --  142* 144*  --   --   BILITOT  --  0.6 0.6  --   --   PROT  --  6.1* 6.1*  --   --   ALBUMIN 2.1* 2.2* 2.2*  2.3* 2.2* 2.3*   No results for input(s): LIPASE, AMYLASE in the last 168 hours. No results for input(s): AMMONIA in the last 168 hours. Coagulation Profile: No results for input(s): INR, PROTIME in the last 168 hours. Cardiac Enzymes: No results for input(s): CKTOTAL, CKMB, CKMBINDEX, TROPONINI in the last 168 hours. BNP (last 3 results) No results for input(s): PROBNP in the last 8760 hours. HbA1C: No results for input(s): HGBA1C in the last 72 hours. CBG: Recent Labs  Lab 10/18/19 1644 10/18/19 2028 10/19/19 0008 10/19/19 0432 10/19/19 0754  GLUCAP 111* 109* 109* 102* 106*   Lipid Profile: No results for input(s): CHOL, HDL, LDLCALC, TRIG, CHOLHDL, LDLDIRECT in the last 72 hours. Thyroid Function Tests: No results for input(s): TSH, T4TOTAL, FREET4, T3FREE, THYROIDAB in the last 72 hours. Anemia Panel: No results for input(s): VITAMINB12, FOLATE, FERRITIN, TIBC, IRON, RETICCTPCT in the last 72 hours. Sepsis Labs: No results for input(s): PROCALCITON, LATICACIDVEN in the last 168 hours.  No results found for this or any previous visit (from the past 240 hour(s)).    Radiology Studies: No results found.   Scheduled Meds: . apixaban  5 mg Per Tube BID  . chlorhexidine gluconate (MEDLINE KIT)  15 mL Mouth Rinse BID  . Chlorhexidine Gluconate Cloth  6 each Topical Daily  . docusate  100 mg Per Tube BID  . feeding supplement (PRO-STAT SUGAR FREE 64)  30 mL Per Tube Daily  . free water  300 mL Per Tube Q4H  . insulin aspart  0-9 Units Subcutaneous Q4H  . mouth rinse  15 mL Mouth Rinse q12n4p  . pantoprazole sodium  40 mg Per Tube Daily  . pneumococcal 23 valent vaccine  0.5 mL Intramuscular Tomorrow-1000  . polyethylene glycol  17 g Per Tube BID   Continuous Infusions: . sodium chloride  Stopped (10/05/19 2000)  . feeding supplement (NEPRO CARB STEADY) 1,000 mL (10/18/19 2240)     LOS: 34 days    Time spent: 25 minutes spent in the coordination of care today.    Jonnie Finner, DO [facility]  If 7PM-7AM, please contact night-coverage [email or web address] 10/19/2019, 11:25 AM

## 2019-10-19 NOTE — Plan of Care (Signed)

## 2019-10-20 ENCOUNTER — Inpatient Hospital Stay (HOSPITAL_COMMUNITY): Payer: Medicare Other

## 2019-10-20 DIAGNOSIS — D75829 Heparin-induced thrombocytopenia, unspecified: Secondary | ICD-10-CM

## 2019-10-20 DIAGNOSIS — N185 Chronic kidney disease, stage 5: Secondary | ICD-10-CM

## 2019-10-20 DIAGNOSIS — D7582 Heparin induced thrombocytopenia (HIT): Secondary | ICD-10-CM

## 2019-10-20 LAB — CBC WITH DIFFERENTIAL/PLATELET
Abs Immature Granulocytes: 0.02 10*3/uL (ref 0.00–0.07)
Basophils Absolute: 0.1 10*3/uL (ref 0.0–0.1)
Basophils Relative: 1 %
Eosinophils Absolute: 0.1 10*3/uL (ref 0.0–0.5)
Eosinophils Relative: 2 %
HCT: 34.8 % — ABNORMAL LOW (ref 39.0–52.0)
Hemoglobin: 11 g/dL — ABNORMAL LOW (ref 13.0–17.0)
Immature Granulocytes: 0 %
Lymphocytes Relative: 35 %
Lymphs Abs: 2 10*3/uL (ref 0.7–4.0)
MCH: 30.1 pg (ref 26.0–34.0)
MCHC: 31.6 g/dL (ref 30.0–36.0)
MCV: 95.1 fL (ref 80.0–100.0)
Monocytes Absolute: 0.7 10*3/uL (ref 0.1–1.0)
Monocytes Relative: 13 %
Neutro Abs: 2.7 10*3/uL (ref 1.7–7.7)
Neutrophils Relative %: 49 %
Platelets: 278 10*3/uL (ref 150–400)
RBC: 3.66 MIL/uL — ABNORMAL LOW (ref 4.22–5.81)
RDW: 14.5 % (ref 11.5–15.5)
WBC: 5.6 10*3/uL (ref 4.0–10.5)
nRBC: 0 % (ref 0.0–0.2)

## 2019-10-20 LAB — RENAL FUNCTION PANEL
Albumin: 2.2 g/dL — ABNORMAL LOW (ref 3.5–5.0)
Anion gap: 8 (ref 5–15)
BUN: 88 mg/dL — ABNORMAL HIGH (ref 8–23)
CO2: 25 mmol/L (ref 22–32)
Calcium: 8.3 mg/dL — ABNORMAL LOW (ref 8.9–10.3)
Chloride: 98 mmol/L (ref 98–111)
Creatinine, Ser: 4.54 mg/dL — ABNORMAL HIGH (ref 0.61–1.24)
GFR calc Af Amer: 15 mL/min — ABNORMAL LOW (ref >60)
GFR calc non Af Amer: 13 mL/min — ABNORMAL LOW (ref >60)
Glucose, Bld: 113 mg/dL — ABNORMAL HIGH (ref 70–99)
Phosphorus: 5.2 mg/dL — ABNORMAL HIGH (ref 2.5–4.6)
Potassium: 4.7 mmol/L (ref 3.5–5.1)
Sodium: 131 mmol/L — ABNORMAL LOW (ref 135–145)

## 2019-10-20 LAB — GLUCOSE, CAPILLARY
Glucose-Capillary: 113 mg/dL — ABNORMAL HIGH (ref 70–99)
Glucose-Capillary: 119 mg/dL — ABNORMAL HIGH (ref 70–99)
Glucose-Capillary: 125 mg/dL — ABNORMAL HIGH (ref 70–99)
Glucose-Capillary: 179 mg/dL — ABNORMAL HIGH (ref 70–99)
Glucose-Capillary: 63 mg/dL — ABNORMAL LOW (ref 70–99)
Glucose-Capillary: 85 mg/dL (ref 70–99)

## 2019-10-20 LAB — APTT: aPTT: 35 seconds (ref 24–36)

## 2019-10-20 LAB — MAGNESIUM: Magnesium: 2.8 mg/dL — ABNORMAL HIGH (ref 1.7–2.4)

## 2019-10-20 MED ORDER — APIXABAN 2.5 MG PO TABS
2.5000 mg | ORAL_TABLET | Freq: Two times a day (BID) | ORAL | Status: DC
  Administered 2019-10-20 – 2019-10-23 (×6): 2.5 mg
  Filled 2019-10-20 (×6): qty 1

## 2019-10-20 NOTE — Progress Notes (Signed)
Physical Therapy Treatment Patient Details Name: Ryan Villa MRN: [id number] DOB: [birth year] Today's Date: 10/20/2019    History of Present Illness Pt adm with rt humeral fx after fall at home and in the floor x 3 days. Pt intubated 12/23 and self extubated 12/25. Pt with acute metabolic encephalopathy and delirium, PMH -R BKA, ckd, dm, htn, intracerebral hemorrhage. Pt now s/p R TSA 1/5. Pt re-intubated 1/8 due to suspected aspiration PNA.    PT Comments    On arrival to room pt agreeable to participate with therapy, though was easily distracted by the TV. Had to turn TV fully off to gain pts attention and have him follow commands. Pt was able to don prosthesis with min A (seconday to UE in sling). Pt reported pain in R residual limb with weight bearing that did not improve with the addition of two socks. Unsure if pain is from previous wound on limb or current prosthetic fit. Pt was able to ambulate in room with mod A +2 for balance. Patient would benefit from continued skilled PT to maximize functional independence and safety with mobility. Will continue to follow acutely.      Follow Up Recommendations  SNF     Equipment Recommendations  None recommended by PT    Recommendations for Other Services       Precautions / Restrictions Precautions Precautions: Fall;Shoulder Type of Shoulder Precautions: okay to WB with transfers, otherwise no lifting; sling on when not ambulating or doing BADLs, okay for pendulums; no abduction; ER: 30 degrees; no AROM; okay hand wrist and elbow AROM Shoulder Interventions: Shoulder sling/immobilizer;For comfort Precaution Comments: R humerus ORIF 1/5, RUE flaccid on 1/15 Required Braces or Orthoses: Sling;Other Brace Restrictions Weight Bearing Restrictions: Yes RUE Weight Bearing: Non weight bearing Other Position/Activity Restrictions: able to WB with RW with transfers     Mobility  Bed Mobility Overal bed mobility: Needs Assistance        Supine to sit: Min assist Sit to supine: Min assist   General bed mobility comments: Min A for LE management and trunk elevation. Needed curing to remember to remove prosthesis before return to bed.  Transfers Overall transfer level: Needs assistance Equipment used: 1 person hand held assist(side by side) Transfers: Sit to/from Stand Sit to Stand: Min assist;Mod assist(mod to maintain once in standing.)         General transfer comment: mod A to rise from EOB. Min A to rise from [facility] or chair with arm rests.  Ambulation/Gait Ambulation/Gait assistance: Mod assist;+2 physical assistance;+2 safety/equipment Gait Distance (Feet): 20 Feet Assistive device: 1 person hand held assist(side by side) Gait Pattern/deviations: Narrow base of support;Step-through pattern;Decreased stance time - right;Decreased stride length     General Gait Details: L lateral lean with poor proprioception.  Pt reporting pain on R residual limb. Added 2 additional socks with no change in pain.  Assist required for balance, sequencing, and weight shift   Stairs             Wheelchair Mobility    Modified Rankin (Stroke Patients Only)       Balance Overall balance assessment: Needs assistance Sitting-balance support: Single extremity supported;No upper extremity supported;Feet supported Sitting balance-Leahy Scale: Poor Sitting balance - Comments: use of LUE to maintain sitting balance.     Standing balance-Leahy Scale: Poor Standing balance comment: able to stand at sink for grooming with min A for balance  Cognition Arousal/Alertness: Awake/alert Behavior During Therapy: Flat affect Overall Cognitive Status: No family/caregiver present to determine baseline cognitive functioning Area of Impairment: Attention;Memory;Following commands;Safety/judgement;Awareness;Problem solving                   Current Attention Level: Selective Memory:  Decreased recall of precautions;Decreased short-term memory Following Commands: Follows one step commands with increased time Safety/Judgement: Decreased awareness of safety Awareness: Intellectual Problem Solving: Requires verbal cues;Requires tactile cues;Slow processing General Comments: pt easily distracted, very focused on the remote. Had to turn off TV to gain his attention. Very little verbalizing during session.      Exercises      General Comments        Pertinent Vitals/Pain Pain Assessment: Faces Faces Pain Scale: Hurts little more Pain Location: Residual limb Pain Descriptors / Indicators: Grimacing;Guarding Pain Intervention(s): Monitored during session;Limited activity within patient's tolerance    Home Living                      Prior Function            PT Goals (current goals can now be found in the care plan section) Acute Rehab PT Goals Patient Stated Goal: none stated PT Goal Formulation: With patient Time For Goal Achievement: 10/18/19 Potential to Achieve Goals: Fair Progress towards PT goals: Progressing toward goals    Frequency    Min 2X/week      PT Plan Current plan remains appropriate    Co-evaluation PT/OT/SLP Co-Evaluation/Treatment: Yes Reason for Co-Treatment: Complexity of the patient's impairments (multi-system involvement);For patient/therapist safety;To address functional/ADL transfers PT goals addressed during session: Mobility/safety with mobility        AM-PAC PT "6 Clicks" Mobility   Outcome Measure  Help needed turning from your back to your side while in a flat bed without using bedrails?: A Lot Help needed moving from lying on your back to sitting on the side of a flat bed without using bedrails?: A Lot Help needed moving to and from a bed to a chair (including a wheelchair)?: A Lot Help needed standing up from a chair using your arms (e.g., wheelchair or bedside chair)?: A Lot Help needed to walk in  hospital room?: A Lot Help needed climbing 3-5 steps with a railing? : A Lot 6 Click Score: 12    End of Session Equipment Utilized During Treatment: Gait belt Activity Tolerance: Patient tolerated treatment well Patient left: with call bell/phone within reach;in bed;with bed alarm set Nurse Communication: Mobility status PT Visit Diagnosis: Other abnormalities of gait and mobility (R26.89);Muscle weakness (generalized) (M62.81);Pain Pain - Right/Left: Right Pain - part of body: Shoulder     Time: [id number] PT Time Calculation (min) (ACUTE ONLY): 32 min  Charges:  $Gait Training: 8-22 mins                    Benjiman Core, [place] Pager [phone] Acute Rehab   Allena Katz 10/20/2019, 3:44 PM

## 2019-10-20 NOTE — Progress Notes (Signed)
PALLIATIVE NOTE:  Chart reviewed and updates received from Mickel Baas, SLP regarding patient's MBS. I have called and left patient's mother and brother a voicemail with a goal of providing updates and re-confirming goals of care.   Will await return call and also attempt to recall at a later time/date.   Alda Lea, AGPCNP-BC  Palliative Medicine Team [phone]

## 2019-10-20 NOTE — Progress Notes (Signed)
  Speech Language Pathology Treatment:    Patient Details Name: Ryan Villa MRN: [id number] DOB: 1955-11-19 Today's Date: 10/20/2019 Time: [id number] SLP Time Calculation (min) (ACUTE ONLY): 10 min  Assessment / Plan / Recommendation Clinical Impression  Pt seen at bedside for education. He continues to deny swallowing difficulty, and declines PEG placement. Will repeat MBS this afternoon at 1400 to determine least restrictive diet in the setting of anticipated continued severe dysphagia. MD, RN and Pallitative Care NP informed.     Ezabella Teska B. Quentin Ore, [facility], [place] Speech Language Pathologist Office: [phone] Pager: [phone]  Shonna Chock 10/20/2019, 9:10 AM

## 2019-10-20 NOTE — Progress Notes (Signed)
Modified Barium Swallow Progress Note  Patient Details  Name: Ryan Villa MRN: [id number] Date of Birth: 1956-03-24  Today's Date: 10/20/2019  Modified Barium Swallow completed.  Full report located under Chart Review in the Imaging Section.  Brief recommendations include the following:  Clinical Impression Patient presents with moderate oropharyngeal dysphagia with all consistencies secondary to decreased bolus cohesion and reduced laryngeal closure. Oral phase was remarkable for decreased bolus cohesion and reduced lingual control resulting in premature spillage to the vallecula. Pharyngeal phase was remarkable for reduced BOT retraction resulting in BOT/vallecular residue, and reduced laryngeal closure resulting in aspiration. Aspiration occurred with both nectar and honey thick liquids. Pt had a weak cough, and was not observed to clear aspirates from larynx. Pt was observed coughing prior to and intermittently throughout the study, not specifically following aspirates. Due to the inconsistency of the cough, it cannot be deteremined that his cough resulted primarly from aspiration. A chin tuck with a straw was utilized during honey thick trials, which utlimately helped prevent aspiration from occuring. Puree and honey thick liquids with chin-tuck recommended.   Swallow Evaluation Recommendations   SLP Diet Recommendations: Dysphagia 1 (Puree) solids;Honey thick liquids   Liquid Administration via: Straw   Medication Administration: Crushed with puree   Supervision: Full assist for feeding   Compensations: Chin tuck   Postural Changes: Seated upright at 90 degrees;Remain semi-upright after after feeds/meals (Comment)   Oral Care Recommendations: Oral care BID   Other Recommendations: Order thickener from [place], Student SLP Office: [phone]  10/20/2019,3:12 PM

## 2019-10-20 NOTE — Progress Notes (Signed)
Occupational Therapy Treatment Patient Details Name: Ryan Villa MRN: [id number] DOB: Oct 27, 1955 Today's Date: 10/20/2019    History of present illness Pt adm with rt humeral fx after fall at home and in the floor x 3 days. Pt intubated 12/23 and self extubated 12/25. Pt with acute metabolic encephalopathy and delirium, PMH -R BKA, ckd, dm, htn, intracerebral hemorrhage. Pt now s/p R TSA 1/5. Pt re-intubated 1/8 due to suspected aspiration PNA.   OT comments  Pt making slow steady progress toward OT goals. Pt completed functional mobility with side by side assist at mod A +2 level to bathroom this session. He remains unsteady with poor coordination with BLEs with prosthesis donned. He c/o pain with prosthesis in WB, additional socks added without relief. Pt has one small scab but no additional damage noted. Pt completed toilet transfer with mod A, continues to require most assist with steadying in functional mobility. Once at sink, facilitated RUE movement. Noted trace contraction in bicep, 3/5 strength in tricep. Facilitated flexion and extension at elbow in preparation for BADL tasks. Sling repositioned and arm positioned on pillow in bed. Pt remains with cognitive deficits- easily distracted and minimum awareness to deficits/safety. D/c recs remain appropriate. Will continue to follow.   Follow Up Recommendations  SNF;Supervision/Assistance - 24 hour    Equipment Recommendations  None recommended by OT    Recommendations for Other Services      Precautions / Restrictions Precautions Precautions: Fall;Shoulder Type of Shoulder Precautions: okay to WB with transfers, otherwise no lifting; sling on when not ambulating or doing BADLs, okay for pendulums; no abduction; ER: 30 degrees; no AROM; okay hand wrist and elbow AROM Shoulder Interventions: Shoulder sling/immobilizer;For comfort Precaution Comments: R humerus ORIF 1/5, RUE with trace bicep movement 1/27 Required Braces or Orthoses:  Sling;Other Brace Restrictions Weight Bearing Restrictions: Yes RUE Weight Bearing: Non weight bearing Other Position/Activity Restrictions: able to WB with RW with transfers        Mobility Bed Mobility Overal bed mobility: Needs Assistance       Supine to sit: Min assist Sit to supine: Min assist   General bed mobility comments: Min A for LE management and trunk elevation. Needed cueing to remember to remove prosthesis before return to bed.  Transfers Overall transfer level: Needs assistance Equipment used: 1 person hand held assist Transfers: Sit to/from Stand Sit to Stand: Min assist;Mod assist         General transfer comment: mod A to rise from EOB. Min A to rise from [facility] or chair with arm rests.    Balance Overall balance assessment: Needs assistance Sitting-balance support: Single extremity supported;No upper extremity supported;Feet supported Sitting balance-Leahy Scale: Poor Sitting balance - Comments: use of LUE to maintain sitting balance.     Standing balance-Leahy Scale: Poor Standing balance comment: able to stand at sink for grooming with min A for balance                           ADL either performed or assessed with clinical judgement   ADL Overall ADL's : Needs assistance/impaired     Grooming: Moderate assistance;Standing;Wash/dry hands Grooming Details (indicate cue type and reason): mod A +2 for standing balance steadying and safety to compelte hand washing at sink. Pt easily distracted and needed cues to manage as well             Lower Body Dressing: Moderate assistance;Sit to/from stand Lower Body Dressing Details (indicate  cue type and reason): pt able to initiate donning parts for prosthesis, needing cues for correct sequencing but able to help self guide caregiver Toilet Transfer: Moderate assistance;+2 for physical assistance;+2 for safety/equipment;Ambulation Toilet Transfer Details (indicate cue type and reason):  completed functional mobility with mod A +2 to toilet, one rest break to sit on the way due to increasing unsteadiness         Functional mobility during ADLs: Moderate assistance;Maximal assistance;+2 for physical assistance;+2 for safety/equipment;Cueing for safety;Cueing for sequencing General ADL Comments: focused session on BADL progression and safety with functional mobility     Vision Baseline Vision/History: Wears glasses Wears Glasses: At all times Patient Visual Report: No change from baseline     Perception     Praxis      Cognition Arousal/Alertness: Awake/alert Behavior During Therapy: Flat affect Overall Cognitive Status: No family/caregiver present to determine baseline cognitive functioning Area of Impairment: Attention;Memory;Following commands;Safety/judgement;Awareness;Problem solving                   Current Attention Level: Sustained Memory: Decreased recall of precautions;Decreased short-term memory Following Commands: Follows one step commands with increased time Safety/Judgement: Decreased awareness of safety;Decreased awareness of deficits Awareness: Intellectual Problem Solving: Requires verbal cues;Requires tactile cues;Slow processing;Decreased initiation;Difficulty sequencing General Comments: easily distractec, needing safety cues. Cues also needed to safely process through multistep tasks.        Exercises     Shoulder Instructions       General Comments      Pertinent Vitals/ Pain       Pain Assessment: Faces Faces Pain Scale: Hurts little more Pain Location: Residual limb Pain Descriptors / Indicators: Grimacing;Guarding Pain Intervention(s): Monitored during session;Limited activity within patient's tolerance  Home Living                                          Prior Functioning/Environment              Frequency  Min 2X/week        Progress Toward Goals  OT Goals(current goals can now be  found in the care plan section)  Progress towards OT goals: Progressing toward goals  Acute Rehab OT Goals Patient Stated Goal: none stated OT Goal Formulation: With patient Time For Goal Achievement: 11/03/19 Potential to Achieve Goals: [place] Discharge plan remains appropriate;Frequency remains appropriate    Co-evaluation    PT/OT/SLP Co-Evaluation/Treatment: Yes Reason for Co-Treatment: Complexity of the patient's impairments (multi-system involvement);For patient/therapist safety;To address functional/ADL transfers PT goals addressed during session: Mobility/safety with mobility OT goals addressed during session: ADL's and self-care;Strengthening/ROM      AM-PAC OT "6 Clicks" Daily Activity     Outcome Measure   Help from another person eating meals?: Total Help from another person taking care of personal grooming?: A Lot Help from another person toileting, which includes using toliet, bedpan, or urinal?: Total Help from another person bathing (including washing, rinsing, drying)?: Total Help from another person to put on and taking off regular upper body clothing?: A Lot Help from another person to put on and taking off regular lower body clothing?: Total 6 Click Score: 8    End of Session Equipment Utilized During Treatment: Other (comment)(RUE sling)  OT Visit Diagnosis: Muscle weakness (generalized) (M62.81);Other symptoms and signs involving cognitive function;Other abnormalities of gait and mobility (R26.89)   Activity Tolerance Patient  tolerated treatment well   Patient Left in bed;with call bell/phone within reach;with bed alarm set   Nurse Communication Mobility status        Time: 1440-1516 OT Time Calculation (min): 36 min  Charges: OT General Charges $OT Visit: 1 Visit OT Treatments $Self Care/Home Management : 8-22 mins  Zenovia Jarred, MSOT, OTR/L [facility] Office Number: [phone]  Zenovia Jarred 10/20/2019,  4:36 PM

## 2019-10-20 NOTE — Progress Notes (Addendum)
PROGRESS NOTE    Ryan Villa  [id number] DOB: [birth year] DOA: 09/15/2019 PCP: Maryella Shivers, MD     Brief Narrative:  Ryan Villa is a 64 year old male was transferred from [facility] 12/23 for acute respiratory failure, rhabdomyolysis, acute kidney injury due to services unavailable at that facility. He was in the ICU intubated and self extubated on 12/25. Transferred out of ICU 12/27.The patient was taken for right shoulder reverse arthroplasty 1/5.Early AM, 1/8 had vomiting with probable aspiration. Placed on NRB and transferred to ICU for intubation 10/01/2019.He was weaned to extubation on 10/04/2019. He has been evaluated by SLP and has a Cortrak tube in place. Family has met with palliative care and is hopeful that he will be able to eat so they are not yet ready for PEG or comfort care. Family made a decision about PEG tube but patient declined, Pt currently tolerating his tube feeds via Cortrak NGT well with no difficulties. Plan is to discharge to SNF once either Cortrak removed with advancement in PO intake vs PEG.   New events last 24 hours / Subjective: No acute events reported overnight.  Planning for MBS today.  Assessment & Plan:   Principal Problem:   Acute respiratory failure (HCC) Active Problems:   HTN (hypertension)   Rhabdomyolysis   Acute renal failure (ARF) (HCC)   Pressure injury of skin   Closed fracture of right proximal humerus   Protein-calorie malnutrition, severe   Encounter for feeding tube placement   Aspiration into airway   Fracture   HIT (heparin-induced thrombocytopenia) (HCC)   CKD (chronic kidney disease) stage 5, GFR less than 15 ml/min (HCC)   Acute hypoxic respiratory failure, Pseudomonal HCAP, Aspiration pneumonia -Has been extubated and completed IV antibiotic course -Currently on room air  Dysphagia -Family has agreed to PEG, patient has declined PEG -MBS and continued palliative care discussion with  family  Severe protein calorie malnutrition -Continue tube feeds  CKD 5 -Baseline creatinine around 5, remains stable -No plans for HD per nephrology; signed off 1/15  Heparin-induced thrombocytopenia -Was on argatroban and switched to eliquis  Essential hypertension -Acceptable control, continue to monitor -Hydralazine PRN   Right proximal humerus fracture  -S/p reverse shoulder arthroplasty 1/5 -RUE in sling     DVT prophylaxis: Eliquis Code Status: Full code Family Communication: None at bedside Disposition Plan: From home. Pending MBS and discussion with palliative care team regarding PEG tube placement. Will eventually need SNF placement once feeding issues sorted out.    Consultants:   Orthopedic surgery  Nephrology  PCCM  Palliative care   Antimicrobials:  Anti-infectives (From admission, onward)   Start     Dose/Rate Route Frequency Ordered Stop   10/04/19 1230  meropenem (MERREM) 500 mg in sodium chloride 0.9 % 100 mL IVPB     500 mg 200 mL/hr over 30 Minutes Intravenous Every 24 hours 10/04/19 1213 10/10/19 1231   10/04/19 1000  ceFEPIme (MAXIPIME) 1 g in sodium chloride 0.9 % 100 mL IVPB  Status:  Discontinued     1 g 200 mL/hr over 30 Minutes Intravenous Every 24 hours 10/03/19 0921 10/04/19 1213   10/03/19 0930  ceFEPIme (MAXIPIME) 1 g in sodium chloride 0.9 % 100 mL IVPB     1 g 200 mL/hr over 30 Minutes Intravenous STAT 10/03/19 0921 10/03/19 1100   10/03/19 0800  cefTAZidime (FORTAZ) 0.5 g in dextrose 5 % 50 mL IVPB  Status:  Discontinued     0.5 g 100 mL/hr  over 30 Minutes Intravenous Every 24 hours 10/03/19 0705 10/03/19 0921   10/01/19 0800  Ampicillin-Sulbactam (UNASYN) 3 g in sodium chloride 0.9 % 100 mL IVPB  Status:  Discontinued     3 g 200 mL/hr over 30 Minutes Intravenous Every 12 hours 10/01/19 0640 10/03/19 0655   09/28/19 2030  ceFAZolin (ANCEF) IVPB 1 g/50 mL premix     1 g 100 mL/hr over 30 Minutes Intravenous Every 12  hours 09/28/19 1624 09/29/19 1910   09/28/19 1157  vancomycin (VANCOCIN) powder  Status:  Discontinued       As needed 09/28/19 1158 09/28/19 1457   09/28/19 0600  vancomycin (VANCOCIN) IVPB 1000 mg/200 mL premix  Status:  Discontinued     1,000 mg 200 mL/hr over 60 Minutes Intravenous On call to O.R. 09/27/19 1159 09/28/19 1607   09/28/19 0000  ceFAZolin (ANCEF) IVPB 2g/100 mL premix    Note to Pharmacy: Surgery delayed until 1/5.   2 g 200 mL/hr over 30 Minutes Intravenous To Surgery 09/22/19 0904 09/28/19 1222   09/22/19 0815  ceFAZolin (ANCEF) IVPB 2g/100 mL premix  Status:  Discontinued     2 g 200 mL/hr over 30 Minutes Intravenous To Surgery 09/22/19 0806 09/22/19 0905   09/15/19 1715  azithromycin (ZITHROMAX) 500 mg in sodium chloride 0.9 % 250 mL IVPB  Status:  Discontinued     500 mg 250 mL/hr over 60 Minutes Intravenous Every 24 hours 09/15/19 1711 09/18/19 1107   09/15/19 1715  cefTRIAXone (ROCEPHIN) 1 g in sodium chloride 0.9 % 100 mL IVPB     1 g 200 mL/hr over 30 Minutes Intravenous Every 24 hours 09/15/19 1711 09/21/19 1754        Objective: Vitals:   10/19/19 2334 10/20/19 0306 10/20/19 0334 10/20/19 0812  BP: (!) 122/46 (!) 112/52  (!) 141/75  Pulse: 62 69  67  Resp: '18 18  16  ' Temp: 97.7 F (36.5 C) 98.5 F (36.9 C)  98.2 F (36.8 C)  TempSrc:  Oral  Oral  SpO2: 98% 99%  97%  Weight:   62.6 kg   Height:        Intake/Output Summary (Last 24 hours) at 10/20/2019 0952 Last data filed at 10/20/2019 0335 Gross per 24 hour  Intake 5952.02 ml  Output 2775 ml  Net 3177.02 ml   Filed Weights   10/14/19 2041 10/19/19 0337 10/20/19 0334  Weight: 69 kg 62.2 kg 62.6 kg    Examination:  General exam: Appears calm and comfortable  Respiratory system: Clear to auscultation anteriorly. Respiratory effort normal. No respiratory distress. On room air  Cardiovascular system: S1 & S2 heard, RRR. No murmurs. No pedal edema. Gastrointestinal system: Abdomen is  nondistended, soft and nontender. Normal bowel sounds heard. +cortrak in place  Central nervous system: Alert  Extremities: Right BKA  Skin: No rashes, lesions or ulcers on exposed skin   Data Reviewed: I have personally reviewed following labs and imaging studies  CBC: Recent Labs  Lab 10/14/19 0418 10/15/19 0733 10/16/19 0335 10/19/19 0233 10/20/19 0431  WBC 6.9 7.4 7.2 6.2 5.6  NEUTROABS  --   --   --  3.5 2.7  HGB 10.1* 10.7* 11.2* 11.7* 11.0*  HCT 32.9* 34.1* 35.4* 37.4* 34.8*  MCV 98.8 96.6 96.7 96.4 95.1  PLT 318 322 326 291 161   Basic Metabolic Panel: Recent Labs  Lab 10/16/19 0335 10/17/19 0504 10/18/19 0220 10/19/19 0233 10/20/19 0431  NA 133* 134*  134* 134*  136 131*  K 4.9 4.8  4.8 5.1 4.7 4.7  CL 100 100  99 98 100 98  CO2 '24 24  24 24 25 25  ' GLUCOSE 109* 111*  114* 118* 108* 113*  BUN 79* 83*  82* 86* 88* 88*  CREATININE 4.37* 4.29*  4.49* 4.59* 4.63* 4.54*  CALCIUM 7.8* 8.2*  8.2* 8.3* 8.4* 8.3*  MG 2.5* 2.6* 2.7* 2.7* 2.8*  PHOS 6.0* 5.7*  5.8* 5.7* 5.5* 5.2*   GFR: Estimated Creatinine Clearance: 14.7 mL/min (A) (by C-G formula based on SCr of 4.54 mg/dL (H)). Liver Function Tests: Recent Labs  Lab 10/16/19 0335 10/17/19 0504 10/18/19 0220 10/19/19 0233 10/20/19 0431  AST 29 27  --   --   --   ALT 27 26  --   --   --   ALKPHOS 142* 144*  --   --   --   BILITOT 0.6 0.6  --   --   --   PROT 6.1* 6.1*  --   --   --   ALBUMIN 2.2* 2.2*  2.3* 2.2* 2.3* 2.2*   No results for input(s): LIPASE, AMYLASE in the last 168 hours. No results for input(s): AMMONIA in the last 168 hours. Coagulation Profile: No results for input(s): INR, PROTIME in the last 168 hours. Cardiac Enzymes: No results for input(s): CKTOTAL, CKMB, CKMBINDEX, TROPONINI in the last 168 hours. BNP (last 3 results) No results for input(s): PROBNP in the last 8760 hours. HbA1C: No results for input(s): HGBA1C in the last 72 hours. CBG: Recent Labs  Lab  10/19/19 1608 10/19/19 2042 10/19/19 2323 10/20/19 0305 10/20/19 0813  GLUCAP 87 130* 78 113* 119*   Lipid Profile: No results for input(s): CHOL, HDL, LDLCALC, TRIG, CHOLHDL, LDLDIRECT in the last 72 hours. Thyroid Function Tests: No results for input(s): TSH, T4TOTAL, FREET4, T3FREE, THYROIDAB in the last 72 hours. Anemia Panel: No results for input(s): VITAMINB12, FOLATE, FERRITIN, TIBC, IRON, RETICCTPCT in the last 72 hours. Sepsis Labs: No results for input(s): PROCALCITON, LATICACIDVEN in the last 168 hours.  No results found for this or any previous visit (from the past 240 hour(s)).    Radiology Studies: No results found.    Scheduled Meds: . apixaban  5 mg Per Tube BID  . chlorhexidine gluconate (MEDLINE KIT)  15 mL Mouth Rinse BID  . Chlorhexidine Gluconate Cloth  6 each Topical Daily  . docusate  100 mg Per Tube BID  . feeding supplement (PRO-STAT SUGAR FREE 64)  30 mL Per Tube Daily  . free water  300 mL Per Tube Q4H  . insulin aspart  0-9 Units Subcutaneous Q4H  . mouth rinse  15 mL Mouth Rinse q12n4p  . pantoprazole sodium  40 mg Per Tube Daily  . pneumococcal 23 valent vaccine  0.5 mL Intramuscular Tomorrow-1000  . polyethylene glycol  17 g Per Tube BID   Continuous Infusions: . sodium chloride Stopped (10/05/19 2000)  . feeding supplement (NEPRO CARB STEADY) 1,000 mL (10/19/19 2115)     LOS: 35 days      Time spent: 25 minutes   Dessa Phi, DO [facility] 10/20/2019, 9:52 AM   Available via Epic secure chat 7am-7pm After these hours, please refer to coverage provider listed on [email or web address]

## 2019-10-21 DIAGNOSIS — N185 Chronic kidney disease, stage 5: Secondary | ICD-10-CM

## 2019-10-21 LAB — GLUCOSE, CAPILLARY
Glucose-Capillary: 102 mg/dL — ABNORMAL HIGH (ref 70–99)
Glucose-Capillary: 104 mg/dL — ABNORMAL HIGH (ref 70–99)
Glucose-Capillary: 108 mg/dL — ABNORMAL HIGH (ref 70–99)
Glucose-Capillary: 142 mg/dL — ABNORMAL HIGH (ref 70–99)
Glucose-Capillary: 157 mg/dL — ABNORMAL HIGH (ref 70–99)
Glucose-Capillary: 166 mg/dL — ABNORMAL HIGH (ref 70–99)
Glucose-Capillary: 176 mg/dL — ABNORMAL HIGH (ref 70–99)

## 2019-10-21 MED ORDER — INSULIN ASPART 100 UNIT/ML ~~LOC~~ SOLN
0.0000 [IU] | SUBCUTANEOUS | Status: DC
  Administered 2019-10-21 – 2019-10-22 (×3): 1 [IU] via SUBCUTANEOUS

## 2019-10-21 NOTE — Progress Notes (Signed)
PatHypoglycemic Event  CBG: 63  Treatment: 4 oz juice/sodaSymptoms: None  Follow-up CBG: Time:0030 CBG Result:104  Possible Reasons for Event: UnknownComments/MD notified:  Chelli Yerkes L Tamie Minteer

## 2019-10-21 NOTE — TOC Progression Note (Signed)
Transition of Care [facility]) - Progression Note    Patient Details  Name: Ryan Villa MRN: [id number] Date of Birth: 05/07/1956  Transition of Care [facility]) CM/SW [place], [place] Work Phone Number: 10/21/2019, 12:49 PM  Clinical Narrative:    MSW Intern spoke with PT's mother to give her placement options. She was given the choice between [facility] and [facility]. She will discuss it with her family and follow up with SW. SW will continue to follow.   Expected Discharge Plan: [place] Barriers to Discharge: Continued Medical Work up  Expected Discharge Plan and Services Expected Discharge Plan: [place] Choice: [place] arrangements for the past 2 months: Single Family Home                                       Social Determinants of Health (SDOH) Interventions    Readmission Risk Interventions No flowsheet data found.

## 2019-10-21 NOTE — Progress Notes (Signed)
Nutrition Follow-up  DOCUMENTATION CODES:   Severe malnutrition in context of acute illness/injury  INTERVENTION:   Magic cup TID with meals, each supplement provides 290 kcal and 9 grams of protein  Double protein portions  Snacks BID  NUTRITION DIAGNOSIS:   Severe Malnutrition related to acute illness(S/P fall at home with rhabdomyolysis after lying on the floor for 3 days) as evidenced by moderate fat depletion, moderate muscle depletion, severe muscle depletion, severe fat depletion.  Ongoing.  GOAL:   Patient will meet greater than or equal to 90% of their needs  Progressing.  MONITOR:   PO intake, Labs, Weight trends, TF tolerance, I & O's  REASON FOR ASSESSMENT:   Consult Assessment of nutrition requirement/status, Poor PO  ASSESSMENT:   64 yo male admitted with respiratory failure, rhabdomyolysis, and AKI after fall at home 3 days PTA. He had been lying on the floor x 3 days since his fall. Found to have PNA, UTI, and right proximal humerus fracture. PMH includes CKD-IV, HLD, DM-2, HTN, L carotid stenosis, ICH, R BKA.  12/25- extubated  12/28- failed MBS, gastric Cortrak placed 1/5- s/p R reverse shoulder arthroplasty  1/2- Cortrak removed, diet advanced DYS1/nectar thick liquids 1/8 vomited x 4 and aspirated, pt tx to ICU and intubated 1/11 extubated 1/12Cortrak placed, tip of tube in stomach 1/27 Diet advanced to DYS1/honey thick liquids  Family agreed to PEG, but pt declined. Per MD, plan is for Cortrak to removed once PO intake is adequate so pt can discharge to SNF.  Pt currently receiving TF via Cortrak with Nepro at goal rate of 50 ml/h and Prostat 30 ml daily.  Per RN, pt ate 100% of breakfast tray. RN assisting pt with lunch at time of RD visit; pt appears to be eating well. RD to order double protein, Magic Cup, and snacks to increase pt's protein/calorie intake. Based on pt's intake today, pt should meet estimated energy needs with PO intake.    Medications reviewed and include: Colace, 328mL free water Q4, SSI, Miralax  Labs reviewed: Sodium 131 (L), Phosphorus 5.2 (H), Magnesium 2.8 (H) CBGs 102-108  UOP: 474mL x24 hours I/O: -12,581.30mL since admit   Diet Order:   Diet Order            DIET - DYS 1 Room service appropriate? Yes with Assist; Fluid consistency: Honey Thick  Diet effective now              EDUCATION NEEDS:   Not appropriate for education at this time  Skin:  Skin Assessment: Skin Integrity Issues: Skin Integrity Issues:: Other (Comment) Stage II: N/A Other: skin tear x2 to R forearm  Last BM:  1/26  Height:   Ht Readings from Last 1 Encounters:  10/01/19 5\' 8"  (1.727 m)    Weight:   Wt Readings from Last 1 Encounters:  10/20/19 62.6 kg    Ideal Body Weight:  65.5 kg(adjusted for BKA)  BMI:  Body mass index is 20.98 kg/m.  Estimated Nutritional Needs:   Kcal:  2000-2200  Protein:  100-125 grams  Fluid:  >2 L/day  Ryan Ina, MS, RD, LDN Pager: [phone] Weekend/After Hours Pager: [phone]

## 2019-10-21 NOTE — Progress Notes (Signed)
Speech Language Pathology Treatment: Dysphagia  Patient Details Name: Ryan Villa MRN: [id number] DOB: August 29, 1956 Today's Date: 10/21/2019 Time: [id number] SLP Time Calculation (min) (ACUTE ONLY): 26 min  Assessment / Plan / Recommendation Clinical Impression  Patient seen for dysphagia treatment. Pt in room, HOB raised so he was sitting upright. Pt eating lunch tray meal upon ST entry. Pt taking a sip of HTL while ST entered room, observed to swallow without using chin tuck.  ST provided education re: findings and recommendations based on MBSS conducted yesterday. Initially, patient saying "I don't know," when asked to verbally repeat recommendation for using chin tuck with HTL sips. After more repetition, pt able to verbalize "tuck my chin" when prompted "what should you do when you swallow?"   Pt observed with sips of HTL with straw x8; He needed consistent verbal/tactile cues to use chin tuck. He was also seen with 2 sips of HTL via cup sip, again needing verbal cues to use chin tuck. Pt seen independently using chin tuck with sip of HTL x1. Pt noted to take a sip, orally hold bolus while tucking chin, raise chin and then swallow x3. ST provided edu re: not lifting chin until AFTER the swallow. Pt did not verbalize understanding but benefited from cues. SLP placed large print written visual aid in patient room as visual reminder for him to use a chin tuck when swallowing liquids. Pt verbalized ability to read x1, then stated "I don't know" when asked to read sign.  Pt also seen with bites of puree (fed by SLP). Pt with prolonged mastication, prolonged bolus formation, grossly adequate oral clearance after the swallow.  ST education RN re: recommendations that patient have dysphagia 1 solids and use a chin tuck with every sip of HTL. Small bites/sips. PO only when patient is sitting upright. Reduce environmental distractions when patient is eating/drinking. Patient needs FULL supervision with  meals as he is high aspiration risk. ST to follow acutely.   HPI HPI: 64 year old male transferred from [facility] for acute respiratory failure, rhabdomyolysis (fell and laid on floor 3 days), acute kidney injury. PMH: CKD stage IV, diabetes mellitus type 2, hyperlipidemia, hypertension, left carotid stenosis, intracerebral hemorrhage, status post right BKA. Per chart EMS reported that the patient's sister was bringing him food while he was lying on the floor during that 3-day period. Intubated 12/23-12/24 (self extubated). Found to have right humeral neck fracture and right upper lobe consolidation per chest x-ray, head CT no acute intracranial process,   Pt underwent MBS 12/28 with findings of severe dysphagia - asp of nectar/honey thick.  Repeat MBS 1/2 recommended Dys 1 diet and nectar thick liquids with chin tuck. On 1/8 pt had vomiting and suspected aspiration requiring reintubation; extuabted 1/11.      SLP Plan  Continue with current plan of care       Recommendations  Diet recommendations: Dysphagia 1 (puree);Honey-thick liquid;Other(comment)(chin tuck with ALL HTL sips) Liquids provided via: Straw Medication Administration: Via alternative means Supervision: Full supervision/cueing for compensatory strategies Compensations: Chin tuck Postural Changes and/or Swallow Maneuvers: Seated upright 90 degrees;Upright 30-60 min after meal                Oral Care Recommendations: Oral care QID Follow up Recommendations: Skilled Nursing facility SLP Visit Diagnosis: Dysphagia, oropharyngeal phase (R13.12) Plan: Continue with current plan of care       [place], M.Ed.,  [facility] Acute [facility] [phone]: Acute Rehab office [phone] - pager   Marina Goodell 10/21/2019, 12:59 PM

## 2019-10-21 NOTE — NC FL2 (Signed)
[place] LEVEL OF CARE SCREENING TOOL     IDENTIFICATION  Patient Name: Ryan Villa Birthdate: 1956/03/26 Sex: male Admission Date (Current Location): 09/15/2019  [facility] and [place] Number:  Herbalist and Address:  [facility], [place] [address], [place], [place] [address]      Provider Number: [id number]  Attending Physician Name and Address:  Dessa Phi, DO  Relative Name and Phone Number:  Kyl Givler [phone]    Current Level of Care: Hospital Recommended Level of Care: [place] Prior Approval Number:    Date Approved/Denied:   PASRR Number: [id number]  Discharge Plan: SNF    Current Diagnoses: Patient Active Problem List   Diagnosis Date Noted  . 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 CKD (chronic kidney disease) stage 5, GFR less than 15 ml/min ([facility]) 10/20/2019  . Aspiration into airway   . Fracture   . Encounter for feeding tube placement   . Protein-calorie malnutrition, severe 09/17/2019  . Acute respiratory failure ([place]) 09/15/2019  . Rhabdomyolysis 09/15/2019  . Acute renal failure (ARF) ([place]) 09/15/2019  . Pressure injury of skin 09/15/2019  . Closed fracture of right proximal humerus 09/15/2019  . Carotid stenosis, left 04/28/2014  . Stroke ([place]) 04/28/2014  . Diabetes ([place]) 11/03/2013  . HTN (hypertension) 11/03/2013  . Hyperlipidemia with target low density lipoprotein (LDL) cholesterol less than 70 mg/dL 11/03/2013  . ICH (intracerebral hemorrhage) ([place]) 07/18/2013    Orientation RESPIRATION BLADDER Height & Weight     Time, Self, Situation, Place  Normal Indwelling catheter Weight: 138 lb 0.1 oz (62.6 kg) Height:  '5\' 8"'  (172.7 cm)  BEHAVIORAL SYMPTOMS/MOOD NEUROLOGICAL BOWEL NUTRITION STATUS      Incontinent Diet(See discharge summary)  AMBULATORY STATUS COMMUNICATION OF NEEDS Skin   Extensive Assist Verbally Surgical wounds, Skin  abrasions(Surgical wounds, left knee, Amputation Right leg; Skin tear right arm; Eccymosis Back, right arm, left leg)                       Personal Care Assistance Level of Assistance  Bathing, Feeding, Dressing Bathing Assistance: Maximum assistance Feeding assistance: Maximum assistance Dressing Assistance: Maximum assistance     Functional Limitations Info  Speech, Sight, Hearing(Hoarse) Sight Info: Adequate Hearing Info: Adequate Speech Info: Adequate    SPECIAL CARE FACTORS FREQUENCY  PT (By licensed PT), OT (By licensed OT), Speech therapy     PT Frequency: 5x week OT Frequency: 5x week     Speech Therapy Frequency: 5x week      Contractures Contractures Info: Not present    Additional Factors Info  Code Status, Allergies, Insulin Sliding Scale Code Status Info: Full Allergies Info: Heparin   Insulin Sliding Scale Info: Insulin Aspart Novolog 0-9 units every 4 hours       Current Medications (10/21/2019):  This is the current hospital active medication list Current Facility-Administered Medications  Medication Dose Route Frequency Provider Last Rate Last Admin  . 0.9 %  sodium chloride infusion  250 mL Intravenous Continuous Jacalyn Lefevre, MD   Stopped at 10/05/19 2000  . acetaminophen (TYLENOL) tablet 650 mg  650 mg Per Tube Q4H PRN Nicholes Stairs, MD      . apixaban Arne [place]) tablet 2.5 mg  2.5 mg Per Tube BID Dessa Phi, DO   2.5 mg at 10/21/19 1017  . bisacodyl (DULCOLAX) suppository 10 mg  10 mg Rectal Daily PRN Nicholes Stairs, MD  10 mg at 10/02/19 1530  . chlorhexidine gluconate (MEDLINE KIT) (PERIDEX) 0.12 % solution 15 mL  15 mL Mouth Rinse BID Anders Simmonds, MD   15 mL at 10/21/19 0831  . Chlorhexidine Gluconate Cloth 2 % PADS 6 each  6 each Topical Daily Nicholes Stairs, MD   6 each at 10/21/19 0831  . docusate (COLACE) 50 MG/5ML liquid 100 mg  100 mg Per Tube BID Kipp Brood, MD   100 mg at 10/21/19 0830  .  feeding supplement (NEPRO CARB STEADY) liquid 1,000 mL  1,000 mL Per Tube Continuous Pokhrel, Laxman, MD 50 mL/hr at 10/21/19 0649 1,000 mL at 10/21/19 0649  . feeding supplement (PRO-STAT SUGAR FREE 64) liquid 30 mL  30 mL Per Tube Daily Pokhrel, Laxman, MD   30 mL at 10/21/19 0831  . free water 300 mL  300 mL Per Tube Q4H Corliss Parish, MD   300 mL at 10/21/19 1209  . hydrALAZINE (APRESOLINE) injection 10 mg  10 mg Intravenous Q4H PRN Nicholes Stairs, MD   10 mg at 10/05/19 1729  . insulin aspart (novoLOG) injection 0-6 Units  0-6 Units Subcutaneous Q4H Dessa Phi, DO      . ipratropium-albuterol (DUONEB) 0.5-2.5 (3) MG/3ML nebulizer solution 3 mL  3 mL Nebulization Q4H PRN Bodenheimer, Charles A, NP      . MEDLINE mouth rinse  15 mL Mouth Rinse q12n4p Rush Farmer, MD   15 mL at 10/21/19 1209  . ondansetron (ZOFRAN) injection 4 mg  4 mg Intravenous Q6H PRN Nicholes Stairs, MD   4 mg at 10/01/19 0426  . pantoprazole sodium (PROTONIX) 40 mg/20 mL oral suspension 40 mg  40 mg Per Tube Daily Kipp Brood, MD   40 mg at 10/21/19 0831  . pneumococcal 23 valent vaccine (PNEUMOVAX-23) injection 0.5 mL  0.5 mL Intramuscular Tomorrow-1000 Rush Farmer, MD      . polyethylene glycol (MIRALAX / GLYCOLAX) packet 17 g  17 g Per Tube BID Kipp Brood, MD   17 g at 10/20/19 2103  . Resource ThickenUp Clear   Oral PRN Flora Lipps, MD         Discharge Medications: Please see discharge summary for a list of discharge medications.  Relevant Imaging Results:  Relevant Lab Results:   Additional Information ss # [place] Jaking Thayer, Student-Social Work

## 2019-10-21 NOTE — Progress Notes (Signed)
PALLIATIVE NOTE:  Patient was able to demonstrate swallowing with limited aspiration by demonstrating chin tuck, straw use, and honey thick trials per reports.   Patient continues to deny complications with aspiration and endorses wishes to continue with po nutrition. He refuses PEG placement.   I spoke with patient's mother and brother Francee Piccolo) to follow up on previous goals of care. Family reports visiting patient earlier today and being pleased patient was tolerating diet. They confirm decision to not proceed with PEG and allow patient to continue with oral nutrition utilizing SLP recommendations. I discussed with family plans to proceed with SNF placement. Wife seemed somewhat confused in conversation. She did verbalized she would prefer [facility] as this is closest to their home and patient also received rehabilitation at that location after his leg amputation. Brother verbalizes agreement. Family aware I will notify the Social Work team on their decision.   Family confirms wishes for outpatient palliative follow-up.   All questions answered. Support given.   Plan -Continue current plan of care per medical team -Family now declines PEG placement and requesting patient continue with po nutrition utilizing SLP recommendations. Aware patient continues to be high risk for aspiration, but by following recommendations, risk can be somewhat decreased.  -Outpatient Palliative/Family decided on [facility] for rehab. (CSW team notified)  Time Total: 45 min.   Visit consisted of counseling and education dealing with the complex and emotionally intense issues of symptom management and palliative care in the setting of serious and potentially life-threatening illness.Greater than 50%  of this time was spent counseling and coordinating care related to the above assessment and plan.  Alda Lea, AGPCNP-BC  Palliative Medicine Team [phone]

## 2019-10-21 NOTE — Progress Notes (Signed)
Inpatient Diabetes Program Recommendations  AACE/ADA: New Consensus Statement on Inpatient Glycemic Control   Target Ranges:  Prepandial:   less than 140 mg/dL      Peak postprandial:   less than 180 mg/dL (1-2 hours)      Critically ill patients:  140 - 180 mg/dL   Results for AURI, MEHRER (MRN [id number]) as of 10/21/2019 15:03  Ref. Range 10/20/2019 08:13 10/20/2019 11:30 10/20/2019 16:25 10/20/2019 20:35 10/20/2019 23:48 10/21/2019 00:32 10/21/2019 04:02 10/21/2019 07:57 10/21/2019 11:42  Glucose-Capillary Latest Ref Range: 70 - 99 mg/dL 119 (H) 125 (H)  Novolog 1 unit 85 179 (H)  Novolog 2 units 63 (L) 104 (H) 108 (H) 102 (H) 166 (H)  Novolog 2 units   Review of Glycemic Control  Current orders for Inpatient glycemic control: Novolog 0-9 units Q4H  Inpatient Diabetes Program Recommendations:   Correction (SSI): Please consider decreasing Novolog correction to 0-6 units Q4H.  Thanks, Barnie Alderman, RN, MSN, CDE Diabetes Coordinator Inpatient Diabetes Program [phone] (Team Pager from 8am to 5pm)

## 2019-10-21 NOTE — Progress Notes (Signed)
PROGRESS NOTE    Ryan Villa  TDS:[id number] DOB: [birth year] DOA: 09/15/2019 PCP: Maryella Shivers, MD     Brief Narrative:  Ryan Villa is a 64 year old male was transferred from [facility] 12/23 for acute respiratory failure, rhabdomyolysis, acute kidney injury due to services unavailable at that facility. He was in the ICU intubated and self extubated on 12/25. Transferred out of ICU 12/27.The patient was taken for right shoulder reverse arthroplasty 1/5.Early AM, 1/8 had vomiting with probable aspiration. Placed on NRB and transferred to ICU for intubation 10/01/2019.He was weaned to extubation on 10/04/2019. He has been evaluated by SLP and has a Cortrak tube in place. Family has met with palliative care and is hopeful that he will be able to eat so they are not yet ready for PEG or comfort care. Family made a decision about PEG tube but patient declined, Pt currently tolerating his tube feeds via Cortrak NGT well with no difficulties. Plan is to discharge to SNF once taking PO adequately.   New events last 24 hours / Subjective: Patient completed MBS yesterday and advance to dysphagia 1 diet.  Hopeful that he can have adequate p.o. intake, remove cortrak and discharge to SNF.  Assessment & Plan:   Principal Problem:   Acute respiratory failure (HCC) Active Problems:   HTN (hypertension)   Rhabdomyolysis   Acute renal failure (ARF) (HCC)   Pressure injury of skin   Closed fracture of right proximal humerus   Protein-calorie malnutrition, severe   Encounter for feeding tube placement   Aspiration into airway   Fracture   HIT (heparin-induced thrombocytopenia) (HCC)   CKD (chronic kidney disease) stage 5, GFR less than 15 ml/min (HCC)   Acute hypoxic respiratory failure, Pseudomonal HCAP, Aspiration pneumonia -Has been extubated and completed IV antibiotic course -Currently on room air  Dysphagia -Family has agreed to PEG, patient has declined PEG -Completed MBS  1/27, currently on dysphagia 1 diet.  Once p.o. intake is adequate, plan to remove cortrak  Severe protein calorie malnutrition -Dietitian consulted  CKD 5 -Baseline creatinine around 5, remains stable -No plans for HD per nephrology; signed off 1/15  Heparin-induced thrombocytopenia -Was on argatroban and switched to eliquis  Essential hypertension -Acceptable control, continue to monitor -Hydralazine PRN   Right proximal humerus fracture  -S/p reverse shoulder arthroplasty 1/5 -RUE in sling     DVT prophylaxis: Eliquis Code Status: Full code Family Communication: None at bedside Disposition Plan: From home PTA. Now he has been advanced to dysphagia 1 diet. Hopefully can have adequate enough PO intake so we can remove cortrak and discharge to SNF. Monitor PO intake closely. Dietitian consulted.    Consultants:   Orthopedic surgery  Nephrology  PCCM  Palliative care   Antimicrobials:  Anti-infectives (From admission, onward)   Start     Dose/Rate Route Frequency Ordered Stop   10/04/19 1230  meropenem (MERREM) 500 mg in sodium chloride 0.9 % 100 mL IVPB     500 mg 200 mL/hr over 30 Minutes Intravenous Every 24 hours 10/04/19 1213 10/10/19 1231   10/04/19 1000  ceFEPIme (MAXIPIME) 1 g in sodium chloride 0.9 % 100 mL IVPB  Status:  Discontinued     1 g 200 mL/hr over 30 Minutes Intravenous Every 24 hours 10/03/19 0921 10/04/19 1213   10/03/19 0930  ceFEPIme (MAXIPIME) 1 g in sodium chloride 0.9 % 100 mL IVPB     1 g 200 mL/hr over 30 Minutes Intravenous STAT 10/03/19 0921 10/03/19 1100  10/03/19 0800  cefTAZidime (FORTAZ) 0.5 g in dextrose 5 % 50 mL IVPB  Status:  Discontinued     0.5 g 100 mL/hr over 30 Minutes Intravenous Every 24 hours 10/03/19 0705 10/03/19 0921   10/01/19 0800  Ampicillin-Sulbactam (UNASYN) 3 g in sodium chloride 0.9 % 100 mL IVPB  Status:  Discontinued     3 g 200 mL/hr over 30 Minutes Intravenous Every 12 hours 10/01/19 0640 10/03/19  0655   09/28/19 2030  ceFAZolin (ANCEF) IVPB 1 g/50 mL premix     1 g 100 mL/hr over 30 Minutes Intravenous Every 12 hours 09/28/19 1624 09/29/19 1910   09/28/19 1157  vancomycin (VANCOCIN) powder  Status:  Discontinued       As needed 09/28/19 1158 09/28/19 1457   09/28/19 0600  vancomycin (VANCOCIN) IVPB 1000 mg/200 mL premix  Status:  Discontinued     1,000 mg 200 mL/hr over 60 Minutes Intravenous On call to O.R. 09/27/19 1159 09/28/19 1607   09/28/19 0000  ceFAZolin (ANCEF) IVPB 2g/100 mL premix    Note to Pharmacy: Surgery delayed until 1/5.   2 g 200 mL/hr over 30 Minutes Intravenous To Surgery 09/22/19 0904 09/28/19 1222   09/22/19 0815  ceFAZolin (ANCEF) IVPB 2g/100 mL premix  Status:  Discontinued     2 g 200 mL/hr over 30 Minutes Intravenous To Surgery 09/22/19 0806 09/22/19 0905   09/15/19 1715  azithromycin (ZITHROMAX) 500 mg in sodium chloride 0.9 % 250 mL IVPB  Status:  Discontinued     500 mg 250 mL/hr over 60 Minutes Intravenous Every 24 hours 09/15/19 1711 09/18/19 1107   09/15/19 1715  cefTRIAXone (ROCEPHIN) 1 g in sodium chloride 0.9 % 100 mL IVPB     1 g 200 mL/hr over 30 Minutes Intravenous Every 24 hours 09/15/19 1711 09/21/19 1754       Objective: Vitals:   10/20/19 2036 10/20/19 2350 10/21/19 0403 10/21/19 0801  BP: (!) 137/115 (!) 115/51 104/61 122/60  Pulse: 73 70 65 64  Resp: '19 19 18 16  ' Temp: 97.8 F (36.6 C) 97.9 F (36.6 C) 97.8 F (36.6 C) 98 F (36.7 C)  TempSrc: Oral Oral Oral Oral  SpO2: 99% 94% 97% 97%  Weight:      Height:        Intake/Output Summary (Last 24 hours) at 10/21/2019 0933 Last data filed at 10/21/2019 0831 Gross per 24 hour  Intake 120 ml  Output 400 ml  Net -280 ml   Filed Weights   10/14/19 2041 10/19/19 0337 10/20/19 0334  Weight: 69 kg 62.2 kg 62.6 kg    Examination: General exam: Appears calm and comfortable  Respiratory system: Clear to auscultation anteriorly. Respiratory effort normal.  No respiratory  distress Cardiovascular system: S1 & S2 heard, RRR. No pedal edema. Gastrointestinal system: Abdomen is nondistended, soft and nontender. Normal bowel sounds heard.  Cortrak in place Central nervous system: Alert  Extremities: Right BKA Skin: Left second toe with an open ulcer on dorsal surface, healing without drainage or sign of infection.  Onychomycosis  Data Reviewed: I have personally reviewed following labs and imaging studies  CBC: Recent Labs  Lab 10/15/19 0733 10/16/19 0335 10/19/19 0233 10/20/19 0431  WBC 7.4 7.2 6.2 5.6  NEUTROABS  --   --  3.5 2.7  HGB 10.7* 11.2* 11.7* 11.0*  HCT 34.1* 35.4* 37.4* 34.8*  MCV 96.6 96.7 96.4 95.1  PLT 322 326 291 725   Basic Metabolic Panel: Recent Labs  Lab 10/16/19 0335 10/17/19 0504 10/18/19 0220 10/19/19 0233 10/20/19 0431  NA 133* 134*   134* 134* 136 131*  K 4.9 4.8   4.8 5.1 4.7 4.7  CL 100 100   99 98 100 98  CO2 '24 24   24 24 25 25  ' GLUCOSE 109* 111*   114* 118* 108* 113*  BUN 79* 83*   82* 86* 88* 88*  CREATININE 4.37* 4.29*   4.49* 4.59* 4.63* 4.54*  CALCIUM 7.8* 8.2*   8.2* 8.3* 8.4* 8.3*  MG 2.5* 2.6* 2.7* 2.7* 2.8*  PHOS 6.0* 5.7*   5.8* 5.7* 5.5* 5.2*   GFR: Estimated Creatinine Clearance: 14.7 mL/min (A) (by C-G formula based on SCr of 4.54 mg/dL (H)). Liver Function Tests: Recent Labs  Lab 10/16/19 0335 10/17/19 0504 10/18/19 0220 10/19/19 0233 10/20/19 0431  AST 29 27  --   --   --   ALT 27 26  --   --   --   ALKPHOS 142* 144*  --   --   --   BILITOT 0.6 0.6  --   --   --   PROT 6.1* 6.1*  --   --   --   ALBUMIN 2.2* 2.2*   2.3* 2.2* 2.3* 2.2*   No results for input(s): LIPASE, AMYLASE in the last 168 hours. No results for input(s): AMMONIA in the last 168 hours. Coagulation Profile: No results for input(s): INR, PROTIME in the last 168 hours. Cardiac Enzymes: No results for input(s): CKTOTAL, CKMB, CKMBINDEX, TROPONINI in the last 168 hours. BNP (last 3 results) No results for input(s):  PROBNP in the last 8760 hours. HbA1C: No results for input(s): HGBA1C in the last 72 hours. CBG: Recent Labs  Lab 10/20/19 2035 10/20/19 2348 10/21/19 0032 10/21/19 0402 10/21/19 0757  GLUCAP 179* 63* 104* 108* 102*   Lipid Profile: No results for input(s): CHOL, HDL, LDLCALC, TRIG, CHOLHDL, LDLDIRECT in the last 72 hours. Thyroid Function Tests: No results for input(s): TSH, T4TOTAL, FREET4, T3FREE, THYROIDAB in the last 72 hours. Anemia Panel: No results for input(s): VITAMINB12, FOLATE, FERRITIN, TIBC, IRON, RETICCTPCT in the last 72 hours. Sepsis Labs: No results for input(s): PROCALCITON, LATICACIDVEN in the last 168 hours.  No results found for this or any previous visit (from the past 240 hour(s)).    Radiology Studies: DG Swallowing Func-Speech Pathology  Result Date: 10/20/2019 Objective Swallowing Evaluation: Type of Study: MBS-Modified Barium Swallow Study  Patient Details Name: Leondre Taul MRN: [id number] Date of Birth: 1956-03-29 Today's Date: 10/20/2019 Time: SLP Start Time (ACUTE ONLY): 1405 -SLP Stop Time (ACUTE ONLY): 1427 SLP Time Calculation (min) (ACUTE ONLY): 22 min Past Medical History: No past medical history on file. Past Surgical History: Past Surgical History: Procedure Laterality Date  REVERSE SHOULDER ARTHROPLASTY Right 09/28/2019  Procedure: RIGHT REVERSE SHOULDER ARTHROPLASTY;  Surgeon: Nicholes Stairs, MD;  Location: [place];  Service: Orthopedics;  Laterality: Right; HPI: 64 year old male transferred from [facility] for acute respiratory failure, rhabdomyolysis (fell and laid on floor 3 days), acute kidney injury. PMH: CKD stage IV, diabetes mellitus type 2, hyperlipidemia, hypertension, left carotid stenosis, intracerebral hemorrhage, status post right BKA. Per chart EMS reported that the patient's sister was bringing him food while he was lying on the floor during that 3-day period. Intubated 12/23-12/24 (self extubated). Found to have right  humeral neck fracture and right upper lobe consolidation per chest x-ray, head CT no acute intracranial process,   Pt underwent MBS 12/28  with findings of severe dysphagia - asp of nectar/honey thick.  Repeat MBS 1/2 recommended Dys 1 diet and nectar thick liquids with chin tuck. On 1/8 pt had vomiting and suspected aspiration requiring reintubation; extuabted 1/11.  Subjective: alert, requiring cues Assessment / Plan / Recommendation CHL IP CLINICAL IMPRESSIONS 10/20/2019 Clinical Impression Patient presents with moderate oropharyngeal dysphagia with all consistencies secondary to decreased bolus cohesion and reduced laryngeal closure. Oral phase was remarkable for decreased bolus cohesion and reduced lingual control resulting in premature spillage to the vallecula. Pharyngeal phase was remarkable for reduced BOT retraction resulting in BOT/vallecular residue, and reduced laryngeal closure resulting in aspiration. Aspiration occurred with both nectar and honey thick liquids. Pt had a weak cough, and was not observed to clear aspirates from larynx. Pt was observed coughing prior to and intermittently throughout the study, not specifically following aspirates. Due to the inconsistency of the cough, it cannot be deteremined that his cough resulted primarly from aspiration. A chin tuck with a straw was utilized during honey thick trials, which utlimately helped prevent aspiration from occuring. Puree and honey thick liquids with chin-tuck recommended.  SLP Visit Diagnosis Dysphagia, oropharyngeal phase (R13.12) Attention and concentration deficit following -- Frontal lobe and executive function deficit following -- Impact on safety and function Moderate aspiration risk   CHL IP TREATMENT RECOMMENDATION 10/20/2019 Treatment Recommendations Therapy as outlined in treatment plan below   Prognosis 10/20/2019 Prognosis for Safe Diet Advancement Good Barriers to Reach Goals Cognitive deficits Barriers/Prognosis Comment -- CHL  IP DIET RECOMMENDATION 10/20/2019 SLP Diet Recommendations Dysphagia 1 (Puree) solids;Honey thick liquids Liquid Administration via Straw Medication Administration Crushed with puree Compensations Chin tuck Postural Changes Seated upright at 90 degrees;Remain semi-upright after after feeds/meals (Comment)   CHL IP OTHER RECOMMENDATIONS 10/20/2019 Recommended Consults -- Oral Care Recommendations Oral care BID Other Recommendations Order thickener from pharmacy   CHL IP FOLLOW UP RECOMMENDATIONS 10/20/2019 Follow up Recommendations Skilled Nursing facility   [facility] IP FREQUENCY AND DURATION 10/20/2019 Speech Therapy Frequency (ACUTE ONLY) min 2x/week Treatment Duration 2 weeks      CHL IP ORAL PHASE 10/20/2019 Oral Phase Impaired Oral - Pudding Teaspoon Premature spillage;Decreased bolus cohesion Oral - Pudding Cup -- Oral - Honey Teaspoon Decreased bolus cohesion;Premature spillage Oral - Honey Cup Other (Comment) Oral - Nectar Teaspoon Decreased bolus cohesion;Premature spillage Oral - Nectar Cup -- Oral - Nectar Straw Decreased bolus cohesion;Premature spillage Oral - Thin Teaspoon NT Oral - Thin Cup -- Oral - Thin Straw NT Oral - Puree Decreased bolus cohesion;Premature spillage Oral - Mech Soft NT Oral - Regular -- Oral - Multi-Consistency -- Oral - Pill -- Oral Phase - Comment --  CHL IP PHARYNGEAL PHASE 10/20/2019 Pharyngeal Phase Impaired Pharyngeal- Pudding Teaspoon -- Pharyngeal -- Pharyngeal- Pudding Cup -- Pharyngeal -- Pharyngeal- Honey Teaspoon Penetration/Aspiration during swallow;Moderate aspiration;Reduced airway/laryngeal closure Pharyngeal -- Pharyngeal- Honey Cup Other (Comment);Penetration/Aspiration during swallow Pharyngeal -- Pharyngeal- Nectar Teaspoon Penetration/Aspiration during swallow;Moderate aspiration;Reduced airway/laryngeal closure Pharyngeal -- Pharyngeal- Nectar Cup NT Pharyngeal -- Pharyngeal- Nectar Straw Compensatory strategies attempted (with notebox);Penetration/Aspiration during  swallow;Reduced airway/laryngeal closure Pharyngeal -- Pharyngeal- Thin Teaspoon NT Pharyngeal -- Pharyngeal- Thin Cup -- Pharyngeal -- Pharyngeal- Thin Straw NT Pharyngeal -- Pharyngeal- Puree Pharyngeal residue - valleculae;Reduced tongue base retraction Pharyngeal -- Pharyngeal- Mechanical Soft -- Pharyngeal -- Pharyngeal- Regular -- Pharyngeal -- Pharyngeal- Multi-consistency -- Pharyngeal -- Pharyngeal- Pill -- Pharyngeal -- Pharyngeal Comment --  CHL IP CERVICAL ESOPHAGEAL PHASE 10/20/2019 Cervical Esophageal Phase WFL Pudding Teaspoon -- Pudding Cup --  Honey Teaspoon -- Honey Cup -- Nectar Teaspoon -- Nectar Cup -- Nectar Straw -- Thin Teaspoon -- Thin Cup -- Thin Straw -- Puree -- Mechanical Soft -- Regular -- Multi-consistency -- Pill -- Cervical Esophageal Comment -- Note populated for Lenore Manner, Student SLP Osie Bond., M.A. CCC-SLP Acute Rehabilitation Services Pager [phone] [facility] [phone] 10/20/2019, 3:30 PM                 Scheduled Meds:  apixaban  2.5 mg Per Tube BID   chlorhexidine gluconate (MEDLINE KIT)  15 mL Mouth Rinse BID   Chlorhexidine Gluconate Cloth  6 each Topical Daily   docusate  100 mg Per Tube BID   feeding supplement (PRO-STAT SUGAR FREE 64)  30 mL Per Tube Daily   free water  300 mL Per Tube Q4H   insulin aspart  0-9 Units Subcutaneous Q4H   mouth rinse  15 mL Mouth Rinse q12n4p   pantoprazole sodium  40 mg Per Tube Daily   pneumococcal 23 valent vaccine  0.5 mL Intramuscular Tomorrow-1000   polyethylene glycol  17 g Per Tube BID   Continuous Infusions:  sodium chloride Stopped (10/05/19 2000)   feeding supplement (NEPRO CARB STEADY) 1,000 mL (10/21/19 0649)     LOS: 36 days      Time spent: 25 minutes   Dessa Phi, DO [facility] 10/21/2019, 9:33 AM   Available via Epic secure chat 7am-7pm After these hours, please refer to coverage provider listed on [email or web address]

## 2019-10-22 LAB — BASIC METABOLIC PANEL
Anion gap: 11 (ref 5–15)
BUN: 107 mg/dL — ABNORMAL HIGH (ref 8–23)
CO2: 22 mmol/L (ref 22–32)
Calcium: 8.3 mg/dL — ABNORMAL LOW (ref 8.9–10.3)
Chloride: 102 mmol/L (ref 98–111)
Creatinine, Ser: 5.04 mg/dL — ABNORMAL HIGH (ref 0.61–1.24)
GFR calc Af Amer: 13 mL/min — ABNORMAL LOW (ref >60)
GFR calc non Af Amer: 11 mL/min — ABNORMAL LOW (ref >60)
Glucose, Bld: 111 mg/dL — ABNORMAL HIGH (ref 70–99)
Potassium: 4.7 mmol/L (ref 3.5–5.1)
Sodium: 135 mmol/L (ref 135–145)

## 2019-10-22 LAB — GLUCOSE, CAPILLARY
Glucose-Capillary: 115 mg/dL — ABNORMAL HIGH (ref 70–99)
Glucose-Capillary: 129 mg/dL — ABNORMAL HIGH (ref 70–99)
Glucose-Capillary: 137 mg/dL — ABNORMAL HIGH (ref 70–99)
Glucose-Capillary: 148 mg/dL — ABNORMAL HIGH (ref 70–99)
Glucose-Capillary: 148 mg/dL — ABNORMAL HIGH (ref 70–99)
Glucose-Capillary: 154 mg/dL — ABNORMAL HIGH (ref 70–99)

## 2019-10-22 NOTE — TOC Progression Note (Signed)
Transition of Care [facility]) - Progression Note    Patient Details  Name: Ryan Villa MRN: [id number] Date of Birth: November 26, 1955  Transition of Care [facility]) CM/SW [place], [place] Phone Number: 10/22/2019, 4:40 PM  Clinical Narrative:   CSW confirmed bed availability at [place] and obtained insurance authorization. Authorization provided to [facility] [place], anticipating weekend discharge pending removal of NG tube and updated COVID test, if patient is stable. CSW discussed barriers to discharge with MD. CSW to continue to follow.    Expected Discharge Plan: [place] Barriers to Discharge: Continued Medical Work up  Expected Discharge Plan and Services Expected Discharge Plan: [place] Choice: [place] arrangements for the past 2 months: Single Family Home                                       Social Determinants of Health (SDOH) Interventions    Readmission Risk Interventions No flowsheet data found.

## 2019-10-22 NOTE — Progress Notes (Signed)
Speech Language Pathology Treatment: Dysphagia  Patient Details Name: Ryan Villa MRN: [id number] DOB: 1956-07-02 Today's Date: 10/22/2019 Time: :[id number] SLP Time Calculation (min) (ACUTE ONLY): 18 min  Assessment / Plan / Recommendation Clinical Impression  Patient received in bed, HOB raised. Patient alert, cortrak in place. ST provided oral care, oral cavity clear, notable for a moderate amount of thin, clear secretions. Secretions removed w/ in-line suction. Today's session with a focus on patient carryover of compensatory strategy of using chin tuck w/ sips to reduce his risk of aspiration. Patient provided education re: using chin tuck with each sip of HTL. Patient stating "so I don't choke" when prompted to describe rationale for using chin tuck. ST provided some additional information re: he has a very risk of aspiration if he does not utilize strategy. Pt stated "this is stupid." ST engaged patient in brief problem solving exercise to discuss possible outcomes of aspiration.  Pt nodded and patient then agreeable to trying straw sips of HTL. Pt given initial verbal cue to use chin tuck, taking a sip via straw and swallowing without use of chin tuck. This resulted in immediate coughing following the swallow. ST provided mod verbal cue and tactile cue with additional sips provided-- patient using chin tuck with sips when these cues given. No overt s/sx aspiration with HTL sips + chin tuck. Patient becoming increasingly agitated, thus PO trials discontinued. ST reminded pt of visual aid in room to remind him to use strategy, he demonstrated ability to read sign. D/W RN: RN reports patient inconsistently using chin tuck during meal earlier. ST provided edu to RN re: patient needing to use chin tuck to mitigate risk of aspiration with HTL.  Recommend: dysphagia 1 diet, honey thick liquids. HTL to be provided with straw as this may make using chin tuck easier. Pt should have full supervision with  meals and cues to use chin tuck with each sip. Pt should be alert and upright for all PO intake, small bites/sips and oral care QID. ST to follow acutely as per POC.   HPI HPI: 64 year old male transferred from [facility] for acute respiratory failure, rhabdomyolysis (fell and laid on floor 3 days), acute kidney injury. PMH: CKD stage IV, diabetes mellitus type 2, hyperlipidemia, hypertension, left carotid stenosis, intracerebral hemorrhage, status post right BKA. Per chart EMS reported that the patient's sister was bringing him food while he was lying on the floor during that 3-day period. Intubated 12/23-12/24 (self extubated). Found to have right humeral neck fracture and right upper lobe consolidation per chest x-ray, head CT no acute intracranial process,   Pt underwent MBS 12/28 with findings of severe dysphagia - asp of nectar/honey thick.  Repeat MBS 1/2 recommended Dys 1 diet and nectar thick liquids with chin tuck. On 1/8 pt had vomiting and suspected aspiration requiring reintubation; extuabted 1/11.      SLP Plan  Continue with current plan of care       Recommendations  Diet recommendations: Dysphagia 1 (puree);Honey-thick liquid Liquids provided via: Straw Medication Administration: Via alternative means Supervision: Full supervision/cueing for compensatory strategies Compensations: Chin tuck Postural Changes and/or Swallow Maneuvers: Seated upright 90 degrees;Upright 30-60 min after meal                Oral Care Recommendations: Oral care QID Follow up Recommendations: Skilled Nursing facility SLP Visit Diagnosis: Dysphagia, oropharyngeal phase (R13.12) Plan: Continue with current plan of care       GO  Marina Goodell, M.Ed., CCC-SLP Speech Therapy Acute Rehabilitation [phone]: Acute Rehab office [phone] - pager   Marina Goodell 10/22/2019, 2:08 PM

## 2019-10-22 NOTE — Progress Notes (Signed)
PROGRESS NOTE    Dave Mergen  RJJ:[id number] DOB: 10-12-1955 DOA: 09/15/2019 PCP: Maryella Shivers, MD     Brief Narrative:  Ryan Villa is a 64 year old male was transferred from [facility] 12/23 for acute respiratory failure, rhabdomyolysis, acute kidney injury due to services unavailable at that facility. He was in the ICU intubated and self extubated on 12/25. Transferred out of ICU 12/27.The patient was taken for right shoulder reverse arthroplasty 1/5.Early AM, 1/8 had vomiting with probable aspiration. Placed on NRB and transferred to ICU for intubation 10/01/2019.He was weaned to extubation on 10/04/2019. He has been evaluated by SLP and has a Cortrak tube in place. Family has met with palliative care and is hopeful that he will be able to eat so they are not yet ready for PEG or comfort care. Family made a decision about PEG tube but patient declined. Patient underwent another MBS and was advanced to dysphagia diet. Plan is to discharge to SNF once taking PO adequately.   New events last 24 hours / Subjective: Had episode of vomiting early this morning.  Has documented 600 mL p.o. intake from yesterday, none so far today.  Assessment & Plan:   Principal Problem:   Acute respiratory failure (HCC) Active Problems:   HTN (hypertension)   Rhabdomyolysis   Acute renal failure (ARF) (HCC)   Pressure injury of skin   Closed fracture of right proximal humerus   Protein-calorie malnutrition, severe   Encounter for feeding tube placement   Aspiration into airway   Fracture   HIT (heparin-induced thrombocytopenia) (HCC)   CKD (chronic kidney disease) stage 5, GFR less than 15 ml/min (HCC)   Acute hypoxic respiratory failure, Pseudomonal HCAP, Aspiration pneumonia -Has been extubated and completed IV antibiotic course -Currently on room air  Dysphagia -Family has agreed to PEG, patient has declined PEG -Completed MBS 1/27, currently on dysphagia 1 diet.  Once p.o.  intake is adequate, plan to remove cortrak  Severe protein calorie malnutrition -Dietitian consulted  CKD 5 -Baseline creatinine around 5, remains stable -No plans for HD per nephrology; signed off 1/15  Heparin-induced thrombocytopenia -Was on argatroban and switched to eliquis  Essential hypertension -Acceptable control, continue to monitor -Hydralazine PRN   Right proximal humerus fracture  -S/p reverse shoulder arthroplasty 1/5 -RUE in sling     DVT prophylaxis: Eliquis Code Status: Full code Family Communication: None at bedside Disposition Plan: From home PTA. Now he has been advanced to dysphagia 1 diet. Hopefully can have adequate enough PO intake so we can remove cortrak and discharge to SNF. Monitor PO intake closely.  Leave in cortrak one more day   Consultants:   Orthopedic surgery  Nephrology  PCCM  Palliative care   Antimicrobials:  Anti-infectives (From admission, onward)   Start     Dose/Rate Route Frequency Ordered Stop   10/04/19 1230  meropenem (MERREM) 500 mg in sodium chloride 0.9 % 100 mL IVPB     500 mg 200 mL/hr over 30 Minutes Intravenous Every 24 hours 10/04/19 1213 10/10/19 1231   10/04/19 1000  ceFEPIme (MAXIPIME) 1 g in sodium chloride 0.9 % 100 mL IVPB  Status:  Discontinued     1 g 200 mL/hr over 30 Minutes Intravenous Every 24 hours 10/03/19 0921 10/04/19 1213   10/03/19 0930  ceFEPIme (MAXIPIME) 1 g in sodium chloride 0.9 % 100 mL IVPB     1 g 200 mL/hr over 30 Minutes Intravenous STAT 10/03/19 0921 10/03/19 1100   10/03/19 0800  cefTAZidime (FORTAZ) 0.5 g in dextrose 5 % 50 mL IVPB  Status:  Discontinued     0.5 g 100 mL/hr over 30 Minutes Intravenous Every 24 hours 10/03/19 0705 10/03/19 0921   10/01/19 0800  Ampicillin-Sulbactam (UNASYN) 3 g in sodium chloride 0.9 % 100 mL IVPB  Status:  Discontinued     3 g 200 mL/hr over 30 Minutes Intravenous Every 12 hours 10/01/19 0640 10/03/19 0655   09/28/19 2030  ceFAZolin  (ANCEF) IVPB 1 g/50 mL premix     1 g 100 mL/hr over 30 Minutes Intravenous Every 12 hours 09/28/19 1624 09/29/19 1910   09/28/19 1157  vancomycin (VANCOCIN) powder  Status:  Discontinued       As needed 09/28/19 1158 09/28/19 1457   09/28/19 0600  vancomycin (VANCOCIN) IVPB 1000 mg/200 mL premix  Status:  Discontinued     1,000 mg 200 mL/hr over 60 Minutes Intravenous On call to O.R. 09/27/19 1159 09/28/19 1607   09/28/19 0000  ceFAZolin (ANCEF) IVPB 2g/100 mL premix    Note to Pharmacy: Surgery delayed until 1/5.   2 g 200 mL/hr over 30 Minutes Intravenous To Surgery 09/22/19 0904 09/28/19 1222   09/22/19 0815  ceFAZolin (ANCEF) IVPB 2g/100 mL premix  Status:  Discontinued     2 g 200 mL/hr over 30 Minutes Intravenous To Surgery 09/22/19 0806 09/22/19 0905   09/15/19 1715  azithromycin (ZITHROMAX) 500 mg in sodium chloride 0.9 % 250 mL IVPB  Status:  Discontinued     500 mg 250 mL/hr over 60 Minutes Intravenous Every 24 hours 09/15/19 1711 09/18/19 1107   09/15/19 1715  cefTRIAXone (ROCEPHIN) 1 g in sodium chloride 0.9 % 100 mL IVPB     1 g 200 mL/hr over 30 Minutes Intravenous Every 24 hours 09/15/19 1711 09/21/19 1754       Objective: Vitals:   10/21/19 1943 10/21/19 2318 10/22/19 0311 10/22/19 0754  BP: (!) 154/49 (!) 127/56 (!) 126/39 (!) 158/71  Pulse: 76 72 69 75  Resp: '16 19 18 18  ' Temp: (!) 97.5 F (36.4 C) 98.5 F (36.9 C) 98.1 F (36.7 C) 98.3 F (36.8 C)  TempSrc: Oral Oral Oral Oral  SpO2: 99% 97% 98% 98%  Weight:      Height:        Intake/Output Summary (Last 24 hours) at 10/22/2019 1026 Last data filed at 10/21/2019 1856 Gross per 24 hour  Intake 360 ml  Output 700 ml  Net -340 ml   Filed Weights   10/14/19 2041 10/19/19 0337 10/20/19 0334  Weight: 69 kg 62.2 kg 62.6 kg    Examination: General exam: Appears calm and comfortable  Respiratory system: Clear to auscultation. Respiratory effort normal. Cardiovascular system: S1 & S2 heard, RRR. No  pedal edema. Gastrointestinal system: Abdomen is nondistended, soft and nontender.    Data Reviewed: I have personally reviewed following labs and imaging studies  CBC: Recent Labs  Lab 10/16/19 0335 10/19/19 0233 10/20/19 0431  WBC 7.2 6.2 5.6  NEUTROABS  --  3.5 2.7  HGB 11.2* 11.7* 11.0*  HCT 35.4* 37.4* 34.8*  MCV 96.7 96.4 95.1  PLT 326 291 222   Basic Metabolic Panel: Recent Labs  Lab 10/16/19 0335 10/16/19 0335 10/17/19 0504 10/18/19 0220 10/19/19 0233 10/20/19 0431 10/22/19 0541  NA 133*   < > 134*  134* 134* 136 131* 135  K 4.9   < > 4.8  4.8 5.1 4.7 4.7 4.7  CL 100   < >  100  99 98 100 98 102  CO2 24   < > '24  24 24 25 25 22  ' GLUCOSE 109*   < > 111*  114* 118* 108* 113* 111*  BUN 79*   < > 83*  82* 86* 88* 88* 107*  CREATININE 4.37*   < > 4.29*  4.49* 4.59* 4.63* 4.54* 5.04*  CALCIUM 7.8*   < > 8.2*  8.2* 8.3* 8.4* 8.3* 8.3*  MG 2.5*  --  2.6* 2.7* 2.7* 2.8*  --   PHOS 6.0*  --  5.7*  5.8* 5.7* 5.5* 5.2*  --    < > = values in this interval not displayed.   GFR: Estimated Creatinine Clearance: 13.3 mL/min (A) (by C-G formula based on SCr of 5.04 mg/dL (H)). Liver Function Tests: Recent Labs  Lab 10/16/19 0335 10/17/19 0504 10/18/19 0220 10/19/19 0233 10/20/19 0431  AST 29 27  --   --   --   ALT 27 26  --   --   --   ALKPHOS 142* 144*  --   --   --   BILITOT 0.6 0.6  --   --   --   PROT 6.1* 6.1*  --   --   --   ALBUMIN 2.2* 2.2*  2.3* 2.2* 2.3* 2.2*   No results for input(s): LIPASE, AMYLASE in the last 168 hours. No results for input(s): AMMONIA in the last 168 hours. Coagulation Profile: No results for input(s): INR, PROTIME in the last 168 hours. Cardiac Enzymes: No results for input(s): CKTOTAL, CKMB, CKMBINDEX, TROPONINI in the last 168 hours. BNP (last 3 results) No results for input(s): PROBNP in the last 8760 hours. HbA1C: No results for input(s): HGBA1C in the last 72 hours. CBG: Recent Labs  Lab 10/21/19 1638  10/21/19 1956 10/21/19 2315 10/22/19 0311 10/22/19 0759  GLUCAP 142* 157* 176* 115* 129*   Lipid Profile: No results for input(s): CHOL, HDL, LDLCALC, TRIG, CHOLHDL, LDLDIRECT in the last 72 hours. Thyroid Function Tests: No results for input(s): TSH, T4TOTAL, FREET4, T3FREE, THYROIDAB in the last 72 hours. Anemia Panel: No results for input(s): VITAMINB12, FOLATE, FERRITIN, TIBC, IRON, RETICCTPCT in the last 72 hours. Sepsis Labs: No results for input(s): PROCALCITON, LATICACIDVEN in the last 168 hours.  No results found for this or any previous visit (from the past 240 hour(s)).    Radiology Studies: DG Swallowing Func-Speech Pathology  Result Date: 10/20/2019 Objective Swallowing Evaluation: Type of Study: MBS-Modified Barium Swallow Study  Patient Details Name: Torey Reinard MRN: [id number] Date of Birth: Oct 03, 1955 Today's Date: 10/20/2019 Time: SLP Start Time (ACUTE ONLY): 1405 -SLP Stop Time (ACUTE ONLY): 1427 SLP Time Calculation (min) (ACUTE ONLY): 22 min Past Medical History: No past medical history on file. Past Surgical History: Past Surgical History: Procedure Laterality Date . REVERSE SHOULDER ARTHROPLASTY Right 09/28/2019  Procedure: RIGHT REVERSE SHOULDER ARTHROPLASTY;  Surgeon: Nicholes Stairs, MD;  Location: [place];  Service: Orthopedics;  Laterality: Right; HPI: 64 year old male transferred from [facility] for acute respiratory failure, rhabdomyolysis (fell and laid on floor 3 days), acute kidney injury. PMH: CKD stage IV, diabetes mellitus type 2, hyperlipidemia, hypertension, left carotid stenosis, intracerebral hemorrhage, status post right BKA. Per chart EMS reported that the patient's sister was bringing him food while he was lying on the floor during that 3-day period. Intubated 12/23-12/24 (self extubated). Found to have right humeral neck fracture and right upper lobe consolidation per chest x-ray, head CT no acute intracranial process,  Pt underwent MBS 12/28  with findings of severe dysphagia - asp of nectar/honey thick.  Repeat MBS 1/2 recommended Dys 1 diet and nectar thick liquids with chin tuck. On 1/8 pt had vomiting and suspected aspiration requiring reintubation; extuabted 1/11.  Subjective: alert, requiring cues Assessment / Plan / Recommendation CHL IP CLINICAL IMPRESSIONS 10/20/2019 Clinical Impression Patient presents with moderate oropharyngeal dysphagia with all consistencies secondary to decreased bolus cohesion and reduced laryngeal closure. Oral phase was remarkable for decreased bolus cohesion and reduced lingual control resulting in premature spillage to the vallecula. Pharyngeal phase was remarkable for reduced BOT retraction resulting in BOT/vallecular residue, and reduced laryngeal closure resulting in aspiration. Aspiration occurred with both nectar and honey thick liquids. Pt had a weak cough, and was not observed to clear aspirates from larynx. Pt was observed coughing prior to and intermittently throughout the study, not specifically following aspirates. Due to the inconsistency of the cough, it cannot be deteremined that his cough resulted primarly from aspiration. A chin tuck with a straw was utilized during honey thick trials, which utlimately helped prevent aspiration from occuring. Puree and honey thick liquids with chin-tuck recommended.  SLP Visit Diagnosis Dysphagia, oropharyngeal phase (R13.12) Attention and concentration deficit following -- Frontal lobe and executive function deficit following -- Impact on safety and function Moderate aspiration risk   CHL IP TREATMENT RECOMMENDATION 10/20/2019 Treatment Recommendations Therapy as outlined in treatment plan below   Prognosis 10/20/2019 Prognosis for Safe Diet Advancement Good Barriers to Reach Goals Cognitive deficits Barriers/Prognosis Comment -- CHL IP DIET RECOMMENDATION 10/20/2019 SLP Diet Recommendations Dysphagia 1 (Puree) solids;Honey thick liquids Liquid Administration via Straw  Medication Administration Crushed with puree Compensations Chin tuck Postural Changes Seated upright at 90 degrees;Remain semi-upright after after feeds/meals (Comment)   CHL IP OTHER RECOMMENDATIONS 10/20/2019 Recommended Consults -- Oral Care Recommendations Oral care BID Other Recommendations Order thickener from pharmacy   CHL IP FOLLOW UP RECOMMENDATIONS 10/20/2019 Follow up Recommendations Skilled Nursing facility   [facility] IP FREQUENCY AND DURATION 10/20/2019 Speech Therapy Frequency (ACUTE ONLY) min 2x/week Treatment Duration 2 weeks      CHL IP ORAL PHASE 10/20/2019 Oral Phase Impaired Oral - Pudding Teaspoon Premature spillage;Decreased bolus cohesion Oral - Pudding Cup -- Oral - Honey Teaspoon Decreased bolus cohesion;Premature spillage Oral - Honey Cup Other (Comment) Oral - Nectar Teaspoon Decreased bolus cohesion;Premature spillage Oral - Nectar Cup -- Oral - Nectar Straw Decreased bolus cohesion;Premature spillage Oral - Thin Teaspoon NT Oral - Thin Cup -- Oral - Thin Straw NT Oral - Puree Decreased bolus cohesion;Premature spillage Oral - Mech Soft NT Oral - Regular -- Oral - Multi-Consistency -- Oral - Pill -- Oral Phase - Comment --  CHL IP PHARYNGEAL PHASE 10/20/2019 Pharyngeal Phase Impaired Pharyngeal- Pudding Teaspoon -- Pharyngeal -- Pharyngeal- Pudding Cup -- Pharyngeal -- Pharyngeal- Honey Teaspoon Penetration/Aspiration during swallow;Moderate aspiration;Reduced airway/laryngeal closure Pharyngeal -- Pharyngeal- Honey Cup Other (Comment);Penetration/Aspiration during swallow Pharyngeal -- Pharyngeal- Nectar Teaspoon Penetration/Aspiration during swallow;Moderate aspiration;Reduced airway/laryngeal closure Pharyngeal -- Pharyngeal- Nectar Cup NT Pharyngeal -- Pharyngeal- Nectar Straw Compensatory strategies attempted (with notebox);Penetration/Aspiration during swallow;Reduced airway/laryngeal closure Pharyngeal -- Pharyngeal- Thin Teaspoon NT Pharyngeal -- Pharyngeal- Thin Cup -- Pharyngeal --  Pharyngeal- Thin Straw NT Pharyngeal -- Pharyngeal- Puree Pharyngeal residue - valleculae;Reduced tongue base retraction Pharyngeal -- Pharyngeal- Mechanical Soft -- Pharyngeal -- Pharyngeal- Regular -- Pharyngeal -- Pharyngeal- Multi-consistency -- Pharyngeal -- Pharyngeal- Pill -- Pharyngeal -- Pharyngeal Comment --  CHL IP CERVICAL ESOPHAGEAL PHASE 10/20/2019 Cervical Esophageal Phase [facility] Pudding  Teaspoon -- Pudding Cup -- Honey Teaspoon -- Honey Cup -- Nectar Teaspoon -- Nectar Cup -- Nectar Straw -- Thin Teaspoon -- Thin Cup -- Thin Straw -- Puree -- Mechanical Soft -- Regular -- Multi-consistency -- Pill -- Cervical Esophageal Comment -- Note populated for Lenore Manner, Student SLP Osie Bond., M.A. CCC-SLP Acute Rehabilitation Services Pager [phone] Office [phone] 10/20/2019, 3:30 PM                 Scheduled Meds: . apixaban  2.5 mg Per Tube BID  . chlorhexidine gluconate (MEDLINE KIT)  15 mL Mouth Rinse BID  . Chlorhexidine Gluconate Cloth  6 each Topical Daily  . docusate  100 mg Per Tube BID  . feeding supplement (PRO-STAT SUGAR FREE 64)  30 mL Per Tube Daily  . free water  300 mL Per Tube Q4H  . insulin aspart  0-6 Units Subcutaneous Q4H  . mouth rinse  15 mL Mouth Rinse q12n4p  . pantoprazole sodium  40 mg Per Tube Daily  . pneumococcal 23 valent vaccine  0.5 mL Intramuscular Tomorrow-1000  . polyethylene glycol  17 g Per Tube BID   Continuous Infusions: . sodium chloride Stopped (10/05/19 2000)  . feeding supplement (NEPRO CARB STEADY) 1,000 mL (10/21/19 1706)     LOS: 37 days      Time spent: 20 minutes   Dessa Phi, DO [facility] 10/22/2019, 10:26 AM   Available via Epic secure chat 7am-7pm After these hours, please refer to coverage provider listed on [email or web address]

## 2019-10-23 DIAGNOSIS — Y92009 Unspecified place in unspecified non-institutional (private) residence as the place of occurrence of the external cause: Secondary | ICD-10-CM | POA: Diagnosis not present

## 2019-10-23 DIAGNOSIS — R531 Weakness: Secondary | ICD-10-CM | POA: Diagnosis not present

## 2019-10-23 DIAGNOSIS — Z471 Aftercare following joint replacement surgery: Secondary | ICD-10-CM | POA: Diagnosis not present

## 2019-10-23 DIAGNOSIS — E1165 Type 2 diabetes mellitus with hyperglycemia: Secondary | ICD-10-CM | POA: Diagnosis not present

## 2019-10-23 DIAGNOSIS — D7582 Heparin induced thrombocytopenia (HIT): Secondary | ICD-10-CM | POA: Diagnosis not present

## 2019-10-23 DIAGNOSIS — E43 Unspecified severe protein-calorie malnutrition: Secondary | ICD-10-CM | POA: Diagnosis not present

## 2019-10-23 DIAGNOSIS — Z7401 Bed confinement status: Secondary | ICD-10-CM | POA: Diagnosis not present

## 2019-10-23 DIAGNOSIS — R601 Generalized edema: Secondary | ICD-10-CM | POA: Diagnosis not present

## 2019-10-23 DIAGNOSIS — J9600 Acute respiratory failure, unspecified whether with hypoxia or hypercapnia: Secondary | ICD-10-CM | POA: Diagnosis not present

## 2019-10-23 DIAGNOSIS — J690 Pneumonitis due to inhalation of food and vomit: Secondary | ICD-10-CM | POA: Diagnosis not present

## 2019-10-23 DIAGNOSIS — Z743 Need for continuous supervision: Secondary | ICD-10-CM | POA: Diagnosis not present

## 2019-10-23 DIAGNOSIS — Z23 Encounter for immunization: Secondary | ICD-10-CM | POA: Diagnosis not present

## 2019-10-23 DIAGNOSIS — I10 Essential (primary) hypertension: Secondary | ICD-10-CM | POA: Diagnosis not present

## 2019-10-23 DIAGNOSIS — N185 Chronic kidney disease, stage 5: Secondary | ICD-10-CM | POA: Diagnosis not present

## 2019-10-23 DIAGNOSIS — R5381 Other malaise: Secondary | ICD-10-CM | POA: Diagnosis not present

## 2019-10-23 DIAGNOSIS — M2550 Pain in unspecified joint: Secondary | ICD-10-CM | POA: Diagnosis not present

## 2019-10-23 DIAGNOSIS — M6282 Rhabdomyolysis: Secondary | ICD-10-CM | POA: Diagnosis not present

## 2019-10-23 DIAGNOSIS — S42201D Unspecified fracture of upper end of right humerus, subsequent encounter for fracture with routine healing: Secondary | ICD-10-CM | POA: Diagnosis not present

## 2019-10-23 DIAGNOSIS — Z89511 Acquired absence of right leg below knee: Secondary | ICD-10-CM | POA: Diagnosis not present

## 2019-10-23 DIAGNOSIS — R1310 Dysphagia, unspecified: Secondary | ICD-10-CM | POA: Diagnosis not present

## 2019-10-23 DIAGNOSIS — Y95 Nosocomial condition: Secondary | ICD-10-CM | POA: Diagnosis not present

## 2019-10-23 DIAGNOSIS — J9601 Acute respiratory failure with hypoxia: Secondary | ICD-10-CM | POA: Diagnosis not present

## 2019-10-23 DIAGNOSIS — K219 Gastro-esophageal reflux disease without esophagitis: Secondary | ICD-10-CM | POA: Diagnosis not present

## 2019-10-23 DIAGNOSIS — S42211S Unspecified displaced fracture of surgical neck of right humerus, sequela: Secondary | ICD-10-CM | POA: Diagnosis not present

## 2019-10-23 DIAGNOSIS — W1830XA Fall on same level, unspecified, initial encounter: Secondary | ICD-10-CM | POA: Diagnosis not present

## 2019-10-23 DIAGNOSIS — E785 Hyperlipidemia, unspecified: Secondary | ICD-10-CM | POA: Diagnosis not present

## 2019-10-23 DIAGNOSIS — R600 Localized edema: Secondary | ICD-10-CM | POA: Diagnosis not present

## 2019-10-23 DIAGNOSIS — E1152 Type 2 diabetes mellitus with diabetic peripheral angiopathy with gangrene: Secondary | ICD-10-CM | POA: Diagnosis not present

## 2019-10-23 LAB — GLUCOSE, CAPILLARY
Glucose-Capillary: 122 mg/dL — ABNORMAL HIGH (ref 70–99)
Glucose-Capillary: 126 mg/dL — ABNORMAL HIGH (ref 70–99)
Glucose-Capillary: 146 mg/dL — ABNORMAL HIGH (ref 70–99)

## 2019-10-23 LAB — SARS CORONAVIRUS 2 (TAT 6-24 HRS): SARS Coronavirus 2: NEGATIVE

## 2019-10-23 MED ORDER — APIXABAN 2.5 MG PO TABS: 2.5000 mg | ORAL_TABLET | Freq: Two times a day (BID) | ORAL | 0 refills | Status: DC

## 2019-10-23 NOTE — Progress Notes (Signed)
AVS placed in patient's discharge packet, report called to Joseph Art, Therapist, sports at [employer] Report given to [facility] and patient taken via stretcher for discharge.

## 2019-10-23 NOTE — TOC Progression Note (Signed)
Transition of Care [facility]) - Progression Note    Patient Details  Name: Randalph Dufrene MRN: [id number] Date of Birth: [birth year]  Transition of [facility]) CM/SW [place], [place] Phone Number: [phone] 10/23/2019, 9:54 AM  Clinical Narrative:     COVID test ordered, pending results for patient discharge to [place].   Expected Discharge Plan: [place] Barriers to Discharge: Continued Medical Work up  Expected Discharge Plan and Services Expected Discharge Plan: [place] Choice: [place] arrangements for the past 2 months: Single Family Home                                       Social Determinants of Health (SDOH) Interventions    Readmission Risk Interventions No flowsheet data found.

## 2019-10-23 NOTE — Discharge Summary (Addendum)
Physician Discharge Summary  Ryan Villa:[id number] DOB: 08-20-1956 DOA: 09/15/2019  PCP: Maryella Shivers, MD  Admit date: 09/15/2019 Discharge date: 10/23/2019  Disposition:  SNF   Recommendations for Outpatient Follow-up:  1. Follow up with PCP in 1 week 2. Follow up with Dr. Stann Mainland, Orthopedic Surgery, as needed  3. Follow up with [place]  4. Follow up with Palliative Care Medicine at [facility] for continued goals of care conversation. Patient remains at high risk for recurrent aspiration and decompensation. He has previously refused PEG placement.   Discharge Condition: Stable CODE STATUS: Full  Diet recommendation: Dysphagia 1 diet, honey thick liquids. HTL to be provided with straw as this may make using chin tuck easier. Pt should have full supervision with meals and cues to use chin tuck with each sip. Pt should be alert and upright for all PO intake, small bites/sips and oral care QID. He is at high risk of recurrent aspiration and decompensation.   Brief/Interim Summary: Ryan Villa is a 64 year old male was transferred from [facility] 12/23 for acute respiratory failure, rhabdomyolysis, acute kidney injury due to services unavailable at that facility. He was in the ICU intubated and self extubated on 12/25. Transferred out of ICU 12/27.The patient was taken for right shoulder reverse arthroplasty 1/5.Early AM, 1/8 had vomiting with probable aspiration. Placed on NRB and transferred to ICU for intubation 10/01/2019.He was weaned to extubation on 10/04/2019. He has been evaluated by SLP and has a Cortrak tube in place. Family has met with palliative care and is hopeful that he will be able to eat so they are not yet ready for PEG or comfort care. Family made a decision about PEG tube but patient declined. Patient underwent another MBS and was advanced to dysphagia diet. Now tolerating dysphagia diet. Cortrak removed 1/30 and discharged to SNF.   Discharge  Diagnoses:  Principal Problem:   Acute respiratory failure ([place]) Active Problems:   HTN (hypertension)   Rhabdomyolysis   Acute renal failure (ARF) (HCC)   Pressure injury of skin   Closed fracture of right proximal humerus   Protein-calorie malnutrition, severe   Encounter for feeding tube placement   Aspiration into airway   Fracture   HIT (heparin-induced thrombocytopenia) (HCC)   CKD (chronic kidney disease) stage 5, GFR less than 15 ml/min (HCC)    Acute hypoxic respiratory failure, Pseudomonal HCAP, Aspiration pneumonia -Has been extubated and completed IV antibiotic course -Currently on room air  Dysphagia -Family has agreed to PEG, patient has declined PEG -Completed MBS 1/27, currently on dysphagia 1 diet. Cortrak removed   Severe protein calorie malnutrition -Dietitian consulted  CKD 5 -Baseline creatinine around 5, remains stable -No plans for HD per nephrology; signed off 1/15  Heparin-induced thrombocytopenia -Was on argatroban and switched to eliquis. Completed treatment while in hospital.   Essential hypertension -Acceptable control, continue to monitor  Right proximal humerus fracture  -S/p reverse shoulder arthroplasty 1/5 -Sling RUE at all times other than when weightbearing with walker or other ADL. Otherwise no lifting.    Discharge Instructions   Allergies as of 10/23/2019      Reactions   Heparin    HIT (SRA positive)      Medication List    STOP taking these medications   amLODipine 10 MG tablet Commonly known as: NORVASC   atorvastatin 40 MG tablet Commonly known as: LIPITOR   carvedilol 6.25 MG tablet Commonly known as: COREG   clopidogrel 75 MG tablet Commonly known as:  PLAVIX   ezetimibe 10 MG tablet Commonly known as: ZETIA   hydrALAZINE 50 MG tablet Commonly known as: APRESOLINE   sodium bicarbonate 650 MG tablet     TAKE these medications   pantoprazole 40 MG tablet Commonly known as: PROTONIX Take 40  mg by mouth daily.      Follow-up Information    [place] [place] Follow up.   Why: We will call with appt information Contact information: [place] [address] [phone]        Nicholes Stairs, MD In 2 weeks.   Specialty: Orthopedic Surgery Why: For suture removal Contact information: [address] [place] [address] [phone]        Maryella Shivers, MD. Schedule an appointment as soon as possible for a visit in 1 week(s).   Specialty: Family Medicine Contact information: [address] [place] [address] [phone]          Allergies  Allergen Reactions  . 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)      Consultants:   Orthopedic surgery  Nephrology  PCCM  Palliative care   Procedures/Studies: CT ABDOMEN WO CONTRAST  Result Date: 10/15/2019 CLINICAL DATA:  64 year old male with a history of dysphagia EXAM: CT ABDOMEN WITHOUT CONTRAST TECHNIQUE: Multidetector CT imaging of the abdomen was performed following the standard protocol without IV contrast. COMPARISON:  None. FINDINGS: Lower chest: Linear opacities at the bilateral lung bases compatible with atelectasis/scarring. No pleural effusion. Coronary artery calcifications. Hepatobiliary: Unremarkable liver.  Unremarkable gallbladder. Pancreas: Unremarkable Spleen: Unremarkable Adrenals/Urinary Tract: - Right adrenal gland:  Unremarkable - Left adrenal gland: Unremarkable. - Right kidney: Thinning renal cortex. No hydronephrosis. Vascular calcifications. Unremarkable visualized proximal ureter. - Left Kidney: Thinning renal cortex. No hydronephrosis. Vascular calcifications. No nephrolithiasis. Unremarkable course the proximal ureter. Cyst on the lateral left renal cortex. Stomach/Bowel: Hiatal hernia. Otherwise unremarkable stomach. Post pyloric feeding tube terminates within the first portion the duodenum. Unremarkable visualized small bowel. Normal  visualized appendix. Mild to moderate stool burden. Vascular/Lymphatic: Vascular calcifications. No adenopathy. Other: None Musculoskeletal: No acute displaced fracture. No significant degenerative changes. No bony canal narrowing. IMPRESSION: No acute CT finding. Aortic Atherosclerosis (ICD10-I70.0). Ancillary findings as above. Electronically Signed   By: Corrie Mckusick D.O.   On: 10/15/2019 09:29   DG Chest 2 View  Result Date: 09/27/2019 CLINICAL DATA:  Dry cough with shortness of breath. Right arm erythema and swelling. EXAM: CHEST - 2 VIEW COMPARISON:  Radiographs 09/19/2019. CT 09/15/2019. FINDINGS: 1451 hours. Mild patient rotation to the right. The central line has been removed. The heart size and mediastinal contours are stable. There is improved aeration of the lungs which are now essentially clear. No pneumothorax or significant pleural effusion. Displaced fracture of the right humeral neck noted. IMPRESSION: Interval improved aeration of the lungs. No acute cardiopulmonary process. Electronically Signed   By: Richardean Sale M.D.   On: 09/27/2019 15:33   NM Pulmonary Perf and Vent  Result Date: 09/27/2019 CLINICAL DATA:  Pulmonary embolism suspected.  Short of breath. EXAM: NUCLEAR MEDICINE PERFUSION LUNG SCAN TECHNIQUE: Perfusion images were obtained in multiple projections after intravenous injection of radiopharmaceutical. Ventilation scans intentionally deferred if perfusion scan and chest x-ray adequate for interpretation during COVID 19 epidemic. RADIOPHARMACEUTICALS:  1.6 mCi Tc-33mMAA IV COMPARISON:  Chest radiograph 09/27/2019 FINDINGS: No wedge-shaped peripheral perfusion defects within LEFT or RIGHT lung to suggest acute pulmonary embolism. IMPRESSION: No evidence acute pulmonary embolism. Electronically  Signed   By: Suzy Bouchard M.D.   On: 09/27/2019 16:42   DG Chest Port 1 View  Result Date: 10/06/2019 CLINICAL DATA:  New cough.  Possible aspiration. EXAM: PORTABLE CHEST 1  VIEW COMPARISON:  Chest x-ray dated October 04, 2019. FINDINGS: Interval removal of the endotracheal tube. Feeding tube entering the stomach with the tip below the field of view. The heart size and mediastinal contours are within normal limits. Atherosclerotic calcification of the aortic arch. Normal pulmonary vascularity. Linear atelectasis in the right mid lung. No focal consolidation, pleural effusion, or pneumothorax. No acute osseous abnormality. Prior right reverse total shoulder arthroplasty. IMPRESSION: No active disease. Electronically Signed   By: Titus Dubin M.D.   On: 10/06/2019 19:06   DG Chest Port 1 View  Result Date: 10/04/2019 CLINICAL DATA:  Respiratory failure intubation EXAM: PORTABLE CHEST 1 VIEW COMPARISON:  October 03, 2019 FINDINGS: The heart size and mediastinal contours are within normal limits. ETT is 2.4 cm above the carina. NG tube is seen coursing below the diaphragm. There is slight interval improvement in the increased interstitial markings at both lung bases. No acute osseous abnormality. IMPRESSION: Slight interval improvement in the interstitial opacities at both lung bases. Electronically Signed   By: Prudencio Pair M.D.   On: 10/04/2019 06:24   DG CHEST PORT 1 VIEW  Result Date: 10/03/2019 CLINICAL DATA:  Respiratory failure. EXAM: PORTABLE CHEST 1 VIEW COMPARISON:  10/02/2019 FINDINGS: The patient is mildly rotated to the right. Endotracheal tube terminates slightly inferior to the clavicular heads, well above the carina. Enteric tube courses into the abdomen with tip not imaged. The cardiomediastinal silhouette is unchanged with normal heart size. Mixed interstitial and airspace opacities, greatest in the lung bases, have not substantially changed allowing for patient rotation and slightly lower lung volumes on today's study. No sizable pleural effusion or pneumothorax is identified. IMPRESSION: Slightly lower lung volumes without significant change in bilateral  airspace disease. Electronically Signed   By: Logan Bores M.D.   On: 10/03/2019 09:40   Portable Chest xray  Result Date: 10/02/2019 CLINICAL DATA:  Intubated. EXAM: PORTABLE CHEST 1 VIEW COMPARISON:  10/01/2019 FINDINGS: Endotracheal tube terminates 3 cm above the carina. Enteric tube courses into the abdomen with tip not imaged. The cardiomediastinal silhouette is unchanged with normal heart size. Mixed interstitial and airspace opacities in the right greater than left lungs, greatest in the bases, are unchanged on the left but significantly improved in the right base. No pleural effusion or pneumothorax is identified. IMPRESSION: Bilateral lower lung airspace disease with improved aeration on the right. Electronically Signed   By: Logan Bores M.D.   On: 10/02/2019 09:51   DG CHEST PORT 1 VIEW  Result Date: 10/01/2019 CLINICAL DATA:  Status post intubation today. EXAM: PORTABLE CHEST 1 VIEW COMPARISON:  Single-view of the chest 10/01/2019 at 2:53 a.m. FINDINGS: Endotracheal tube is now in place with the tip in good position 3 cm above the carina. New NG tube courses into the stomach and below the inferior margin of the film. Airspace disease in the right lung base has worsened. Mild airspace disease in the left lung base is unchanged. Heart size is normal. No pneumothorax or pleural effusion. IMPRESSION: Endotracheal tube is in good position. OG tube courses into the stomach and below the inferior margin the film. Worsened right lower lobe airspace disease which could be due to aspiration or pneumonia. Electronically Signed   By: Inge Rise M.D.   On:  10/01/2019 07:24   DG CHEST PORT 1 VIEW  Result Date: 10/01/2019 CLINICAL DATA:  Shortness of breath EXAM: PORTABLE CHEST 1 VIEW COMPARISON:  09/27/2019 FINDINGS: Status post right shoulder total arthroplasty. Increased opacity at the right lung base. No pleural effusion or pneumothorax. Normal cardiomediastinal contours. Mild left basilar opacities,  likely atelectasis. IMPRESSION: Right-greater-than-left basilar opacities, which may indicate aspiration or atelectasis. Electronically Signed   By: Ulyses Jarred M.D.   On: 10/01/2019 03:11   DG Shoulder Right Port  Result Date: 09/28/2019 CLINICAL DATA:  Right shoulder arthroplasty. EXAM: PORTABLE RIGHT SHOULDER COMPARISON:  Chest x-ray 09/27/2019. FINDINGS: Total right shoulder replacement. Fracture fragments noted about the proximal humerus. Hardware intact. Anatomic alignment. IMPRESSION: Total right shoulder replacement anatomic alignment. Fracture fragments noted about the proximal humerus. Electronically Signed   By: Marcello Moores  Register   On: 09/28/2019 15:37   DG Abd Portable 1V  Result Date: 10/04/2019 CLINICAL DATA:  Ileus EXAM: PORTABLE ABDOMEN - 1 VIEW COMPARISON:  October 01, 2019 FINDINGS: NG tube is seen within the first portion of the duodenum. Contrast is seen in nondilated loops of bowel to the level of the rectum. Air seen in nondilated bowel in the mid abdomen. It no acute osseous abnormality. IMPRESSION: Nonobstructive bowel gas pattern. Electronically Signed   By: Prudencio Pair M.D.   On: 10/04/2019 06:21   DG Abd Portable 1V  Result Date: 10/01/2019 CLINICAL DATA:  Vomiting, fever and nausea EXAM: PORTABLE ABDOMEN - 1 VIEW COMPARISON:  Radiograph 09/22/2019 FINDINGS: A transesophageal tube tip is directed towards the gastric antrum/duodenal bulb with mild kinking. Side port is distal to the GE junction. Radiodense contrast material partially opacifies the colon. Few air-filled loops of bowel are seen in the mid abdomen. Patchy mixed interstitial airspace opacities are present throughout the lungs. No pneumothorax or effusion. Cardiomediastinal contours as included are unremarkable. Mild degenerative changes present in the spine. IMPRESSION: 1. A transesophageal tube tip is directed towards the gastric antrum/duodenal bulb with mild kinking. Side port is distal to the GE junction. 2.  Radiodense contrast material partially opacifies the colon. Electronically Signed   By: Lovena Le M.D.   On: 10/01/2019 05:41   DG Swallowing Func-Speech Pathology  Result Date: 10/20/2019 Objective Swallowing Evaluation: Type of Study: MBS-Modified Barium Swallow Study  Patient Details Name: Ryan Villa MRN: [id number] Date of Birth: 1956/05/13 Today's Date: 10/20/2019 Time: SLP Start Time (ACUTE ONLY): 1405 -SLP Stop Time (ACUTE ONLY): 1427 SLP Time Calculation (min) (ACUTE ONLY): 22 min Past Medical History: No past medical history on file. Past Surgical History: Past Surgical History: Procedure Laterality Date . REVERSE SHOULDER ARTHROPLASTY Right 09/28/2019  Procedure: RIGHT REVERSE SHOULDER ARTHROPLASTY;  Surgeon: Nicholes Stairs, MD;  Location: [place];  Service: Orthopedics;  Laterality: Right; HPI: 64 year old male transferred from [facility] for acute respiratory failure, rhabdomyolysis (fell and laid on floor 3 days), acute kidney injury. PMH: CKD stage IV, diabetes mellitus type 2, hyperlipidemia, hypertension, left carotid stenosis, intracerebral hemorrhage, status post right BKA. Per chart EMS reported that the patient's sister was bringing him food while he was lying on the floor during that 3-day period. Intubated 12/23-12/24 (self extubated). Found to have right humeral neck fracture and right upper lobe consolidation per chest x-ray, head CT no acute intracranial process,   Pt underwent MBS 12/28 with findings of severe dysphagia - asp of nectar/honey thick.  Repeat MBS 1/2 recommended Dys 1 diet and nectar thick liquids with chin tuck. On 1/8 pt had  vomiting and suspected aspiration requiring reintubation; extuabted 1/11.  Subjective: alert, requiring cues Assessment / Plan / Recommendation CHL IP CLINICAL IMPRESSIONS 10/20/2019 Clinical Impression Patient presents with moderate oropharyngeal dysphagia with all consistencies secondary to decreased bolus cohesion and reduced laryngeal  closure. Oral phase was remarkable for decreased bolus cohesion and reduced lingual control resulting in premature spillage to the vallecula. Pharyngeal phase was remarkable for reduced BOT retraction resulting in BOT/vallecular residue, and reduced laryngeal closure resulting in aspiration. Aspiration occurred with both nectar and honey thick liquids. Pt had a weak cough, and was not observed to clear aspirates from larynx. Pt was observed coughing prior to and intermittently throughout the study, not specifically following aspirates. Due to the inconsistency of the cough, it cannot be deteremined that his cough resulted primarly from aspiration. A chin tuck with a straw was utilized during honey thick trials, which utlimately helped prevent aspiration from occuring. Puree and honey thick liquids with chin-tuck recommended.  SLP Visit Diagnosis Dysphagia, oropharyngeal phase (R13.12) Attention and concentration deficit following -- Frontal lobe and executive function deficit following -- Impact on safety and function Moderate aspiration risk   CHL IP TREATMENT RECOMMENDATION 10/20/2019 Treatment Recommendations Therapy as outlined in treatment plan below   Prognosis 10/20/2019 Prognosis for Safe Diet Advancement Good Barriers to Reach Goals Cognitive deficits Barriers/Prognosis Comment -- CHL IP DIET RECOMMENDATION 10/20/2019 SLP Diet Recommendations Dysphagia 1 (Puree) solids;Honey thick liquids Liquid Administration via Straw Medication Administration Crushed with puree Compensations Chin tuck Postural Changes Seated upright at 90 degrees;Remain semi-upright after after feeds/meals (Comment)   CHL IP OTHER RECOMMENDATIONS 10/20/2019 Recommended Consults -- Oral Care Recommendations Oral care BID Other Recommendations Order thickener from pharmacy   CHL IP FOLLOW UP RECOMMENDATIONS 10/20/2019 Follow up Recommendations Skilled Nursing facility   [facility] IP FREQUENCY AND DURATION 10/20/2019 Speech Therapy Frequency (ACUTE  ONLY) min 2x/week Treatment Duration 2 weeks      CHL IP ORAL PHASE 10/20/2019 Oral Phase Impaired Oral - Pudding Teaspoon Premature spillage;Decreased bolus cohesion Oral - Pudding Cup -- Oral - Honey Teaspoon Decreased bolus cohesion;Premature spillage Oral - Honey Cup Other (Comment) Oral - Nectar Teaspoon Decreased bolus cohesion;Premature spillage Oral - Nectar Cup -- Oral - Nectar Straw Decreased bolus cohesion;Premature spillage Oral - Thin Teaspoon NT Oral - Thin Cup -- Oral - Thin Straw NT Oral - Puree Decreased bolus cohesion;Premature spillage Oral - Mech Soft NT Oral - Regular -- Oral - Multi-Consistency -- Oral - Pill -- Oral Phase - Comment --  CHL IP PHARYNGEAL PHASE 10/20/2019 Pharyngeal Phase Impaired Pharyngeal- Pudding Teaspoon -- Pharyngeal -- Pharyngeal- Pudding Cup -- Pharyngeal -- Pharyngeal- Honey Teaspoon Penetration/Aspiration during swallow;Moderate aspiration;Reduced airway/laryngeal closure Pharyngeal -- Pharyngeal- Honey Cup Other (Comment);Penetration/Aspiration during swallow Pharyngeal -- Pharyngeal- Nectar Teaspoon Penetration/Aspiration during swallow;Moderate aspiration;Reduced airway/laryngeal closure Pharyngeal -- Pharyngeal- Nectar Cup NT Pharyngeal -- Pharyngeal- Nectar Straw Compensatory strategies attempted (with notebox);Penetration/Aspiration during swallow;Reduced airway/laryngeal closure Pharyngeal -- Pharyngeal- Thin Teaspoon NT Pharyngeal -- Pharyngeal- Thin Cup -- Pharyngeal -- Pharyngeal- Thin Straw NT Pharyngeal -- Pharyngeal- Puree Pharyngeal residue - valleculae;Reduced tongue base retraction Pharyngeal -- Pharyngeal- Mechanical Soft -- Pharyngeal -- Pharyngeal- Regular -- Pharyngeal -- Pharyngeal- Multi-consistency -- Pharyngeal -- Pharyngeal- Pill -- Pharyngeal -- Pharyngeal Comment --  CHL IP CERVICAL ESOPHAGEAL PHASE 10/20/2019 Cervical Esophageal Phase WFL Pudding Teaspoon -- Pudding Cup -- Honey Teaspoon -- Honey Cup -- Nectar Teaspoon -- Nectar Cup -- Nectar  Straw -- Thin Teaspoon -- Thin Cup -- Thin Straw -- Puree -- Mechanical Soft --  Regular -- Multi-consistency -- Pill -- Cervical Esophageal Comment -- Note populated for Ryan Villa, Student SLP Osie Bond., M.A. CCC-SLP Acute Rehabilitation Services Pager [phone] Office [phone] 10/20/2019, 3:30 PM              DG Swallowing Func-Speech Pathology  Result Date: 10/06/2019 Objective Swallowing Evaluation: Type of Study: MBS-Modified Barium Swallow Study  Patient Details Name: Ryan Villa MRN: [id number] Date of Birth: Mar 04, 1956 Today's Date: 10/06/2019 Time: SLP Start Time (ACUTE ONLY): 0935 -SLP Stop Time (ACUTE ONLY): 1000 SLP Time Calculation (min) (ACUTE ONLY): 25 min Past Medical History: No past medical history on file. Past Surgical History: Past Surgical History: Procedure Laterality Date . REVERSE SHOULDER ARTHROPLASTY Right 09/28/2019  Procedure: RIGHT REVERSE SHOULDER ARTHROPLASTY;  Surgeon: Nicholes Stairs, MD;  Location: [facility];  Service: Orthopedics;  Laterality: Right; HPI: 64 year old male transferred from [facility] for acute respiratory failure, rhabdomyolysis (fell and laid on floor 3 days), acute kidney injury. PMH: CKD stage IV, diabetes mellitus type 2, hyperlipidemia, hypertension, left carotid stenosis, intracerebral hemorrhage, status post right BKA. Per chart EMS reported that the patient's sister was bringing him food while he was lying on the floor during that 3-day period. Intubated 12/23-12/24 (self extubated). Found to have right humeral neck fracture and right upper lobe consolidation per chest x-ray, head CT no acute intracranial process,   Pt underwent MBS 12/28 with findings of severe dysphagia - asp of nectar/honey thick.  Repeat MBS 1/2 recommended Dys 1 diet and nectar thick liquids with chin tuck. On 1/8 pt had vomiting and suspected aspiration requiring reintubation; extuabted 1/11.  Subjective: alert, needs cues Assessment / Plan / Recommendation CHL IP  CLINICAL IMPRESSIONS 10/06/2019 Clinical Impression Pts function is similiar in nature to prior MBS, but worsened in severeity with further deconditioning. Pt has slow lingual pumping and premature spillage with delayed swallow initiation. There is silent aspiration of moderate quantity with nectar and honey teaspoon. There is typically a very delayed congested cough when aspirate descends into the trachea. He cannot elicit a dry swallow without prolonged lingual pumping which makes a "clear throat and swallow" strategy pointless as he just reaspirates what he cleared while he tries to swallow. He was successful with puree, even with a heaping spoon. A chin tuck is very beneficial in increased epiglottic deflection, reducing vallecular residue and capturing a 1/2 teaspoon of honey thick prior to the swallow. HOWEVER, this must be done with 100% accuracy or pt will aspirate. Pt needs max cues for a deep chin tuck and the quantity of honey thick must be 1/2 teaspoon. And given silent aspiration it is difficult to determine success of this method subjectively. For this reason, recommend pt continue with Cortrak, consuming pudding/puree only (no liquids). SLP only can offer liquid trials.  SLP Visit Diagnosis Dysphagia, oropharyngeal phase (R13.12) Attention and concentration deficit following -- Frontal lobe and executive function deficit following -- Impact on safety and function Moderate aspiration risk   CHL IP TREATMENT RECOMMENDATION 10/06/2019 Treatment Recommendations Therapy as outlined in treatment plan below   Prognosis 10/06/2019 Prognosis for Safe Diet Advancement Good Barriers to Reach Goals Cognitive deficits Barriers/Prognosis Comment -- CHL IP DIET RECOMMENDATION 10/06/2019 SLP Diet Recommendations Pudding thick liquid;Dysphagia 1 (Puree) solids Liquid Administration via -- Medication Administration Via alternative means Compensations Chin tuck Postural Changes --   CHL IP OTHER RECOMMENDATIONS 10/04/2019  Recommended Consults -- Oral Care Recommendations -- Other Recommendations Have oral suction available   CHL IP FOLLOW UP RECOMMENDATIONS  10/06/2019 Follow up Recommendations Skilled Nursing facility   [facility] IP FREQUENCY AND DURATION 10/06/2019 Speech Therapy Frequency (ACUTE ONLY) min 2x/week Treatment Duration 2 weeks      CHL IP ORAL PHASE 10/06/2019 Oral Phase Impaired Oral - Pudding Teaspoon -- Oral - Pudding Cup -- Oral - Honey Teaspoon Lingual pumping;Reduced posterior propulsion;Delayed oral transit;Decreased bolus cohesion;Premature spillage Oral - Honey Cup -- Oral - Nectar Teaspoon Lingual pumping;Reduced posterior propulsion;Delayed oral transit;Decreased bolus cohesion;Premature spillage Oral - Nectar Cup -- Oral - Nectar Straw NT Oral - Thin Teaspoon NT Oral - Thin Cup -- Oral - Thin Straw NT Oral - Puree Lingual pumping;Reduced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

## 2019-10-23 NOTE — Progress Notes (Signed)
Patient will DC to: [facility] Anticipated DC date: 10/23/19 Family notified:[facility] [id number]  Per MD patient ready for DC to [facility] and [place], patient, patient's family, and facility notified of DC. Discharge Summary sent to facility. RN given number for report  [phone] Room 110. DC packet on chart. Ambulance transport requested for patient.  CSW signing off.  [place], [place]

## 2019-10-25 DIAGNOSIS — N185 Chronic kidney disease, stage 5: Secondary | ICD-10-CM | POA: Diagnosis not present

## 2019-10-25 DIAGNOSIS — D7582 Heparin induced thrombocytopenia (HIT): Secondary | ICD-10-CM | POA: Diagnosis not present

## 2019-10-25 DIAGNOSIS — J690 Pneumonitis due to inhalation of food and vomit: Secondary | ICD-10-CM | POA: Diagnosis not present

## 2019-10-25 DIAGNOSIS — S42211S Unspecified displaced fracture of surgical neck of right humerus, sequela: Secondary | ICD-10-CM | POA: Diagnosis not present

## 2019-10-31 DIAGNOSIS — E785 Hyperlipidemia, unspecified: Secondary | ICD-10-CM | POA: Diagnosis not present

## 2019-10-31 DIAGNOSIS — E1152 Type 2 diabetes mellitus with diabetic peripheral angiopathy with gangrene: Secondary | ICD-10-CM | POA: Diagnosis not present

## 2019-10-31 DIAGNOSIS — I10 Essential (primary) hypertension: Secondary | ICD-10-CM | POA: Diagnosis not present

## 2019-10-31 DIAGNOSIS — E1165 Type 2 diabetes mellitus with hyperglycemia: Secondary | ICD-10-CM | POA: Diagnosis not present

## 2019-11-01 DIAGNOSIS — N185 Chronic kidney disease, stage 5: Secondary | ICD-10-CM | POA: Diagnosis not present

## 2019-11-01 DIAGNOSIS — D7582 Heparin induced thrombocytopenia (HIT): Secondary | ICD-10-CM | POA: Diagnosis not present

## 2019-11-01 DIAGNOSIS — S42211S Unspecified displaced fracture of surgical neck of right humerus, sequela: Secondary | ICD-10-CM | POA: Diagnosis not present

## 2019-11-01 DIAGNOSIS — J690 Pneumonitis due to inhalation of food and vomit: Secondary | ICD-10-CM | POA: Diagnosis not present

## 2019-11-08 DIAGNOSIS — D7582 Heparin induced thrombocytopenia (HIT): Secondary | ICD-10-CM | POA: Diagnosis not present

## 2019-11-08 DIAGNOSIS — S42211S Unspecified displaced fracture of surgical neck of right humerus, sequela: Secondary | ICD-10-CM | POA: Diagnosis not present

## 2019-11-08 DIAGNOSIS — N185 Chronic kidney disease, stage 5: Secondary | ICD-10-CM | POA: Diagnosis not present

## 2019-11-08 DIAGNOSIS — R5381 Other malaise: Secondary | ICD-10-CM | POA: Diagnosis not present

## 2019-11-15 DIAGNOSIS — Z03818 Encounter for observation for suspected exposure to other biological agents ruled out: Secondary | ICD-10-CM | POA: Diagnosis not present

## 2019-11-15 DIAGNOSIS — R296 Repeated falls: Secondary | ICD-10-CM | POA: Diagnosis not present

## 2019-11-15 DIAGNOSIS — S42211S Unspecified displaced fracture of surgical neck of right humerus, sequela: Secondary | ICD-10-CM | POA: Diagnosis not present

## 2019-11-15 DIAGNOSIS — E559 Vitamin D deficiency, unspecified: Secondary | ICD-10-CM | POA: Diagnosis not present

## 2019-11-15 DIAGNOSIS — R1310 Dysphagia, unspecified: Secondary | ICD-10-CM | POA: Diagnosis not present

## 2019-11-15 DIAGNOSIS — S42201D Unspecified fracture of upper end of right humerus, subsequent encounter for fracture with routine healing: Secondary | ICD-10-CM | POA: Diagnosis not present

## 2019-11-15 DIAGNOSIS — M6281 Muscle weakness (generalized): Secondary | ICD-10-CM | POA: Diagnosis not present

## 2019-11-15 DIAGNOSIS — N185 Chronic kidney disease, stage 5: Secondary | ICD-10-CM | POA: Diagnosis not present

## 2019-11-15 DIAGNOSIS — D7582 Heparin induced thrombocytopenia (HIT): Secondary | ICD-10-CM | POA: Diagnosis not present

## 2019-11-16 DIAGNOSIS — Z4789 Encounter for other orthopedic aftercare: Secondary | ICD-10-CM | POA: Diagnosis not present

## 2019-11-16 DIAGNOSIS — R296 Repeated falls: Secondary | ICD-10-CM | POA: Diagnosis not present

## 2019-11-16 DIAGNOSIS — M6281 Muscle weakness (generalized): Secondary | ICD-10-CM | POA: Diagnosis not present

## 2019-11-16 DIAGNOSIS — R1310 Dysphagia, unspecified: Secondary | ICD-10-CM | POA: Diagnosis not present

## 2019-11-16 DIAGNOSIS — S42201D Unspecified fracture of upper end of right humerus, subsequent encounter for fracture with routine healing: Secondary | ICD-10-CM | POA: Diagnosis not present

## 2019-11-17 DIAGNOSIS — R1310 Dysphagia, unspecified: Secondary | ICD-10-CM | POA: Diagnosis not present

## 2019-11-17 DIAGNOSIS — M6281 Muscle weakness (generalized): Secondary | ICD-10-CM | POA: Diagnosis not present

## 2019-11-17 DIAGNOSIS — R296 Repeated falls: Secondary | ICD-10-CM | POA: Diagnosis not present

## 2019-11-17 DIAGNOSIS — S42201D Unspecified fracture of upper end of right humerus, subsequent encounter for fracture with routine healing: Secondary | ICD-10-CM | POA: Diagnosis not present

## 2019-11-18 DIAGNOSIS — S42201D Unspecified fracture of upper end of right humerus, subsequent encounter for fracture with routine healing: Secondary | ICD-10-CM | POA: Diagnosis not present

## 2019-11-18 DIAGNOSIS — M6281 Muscle weakness (generalized): Secondary | ICD-10-CM | POA: Diagnosis not present

## 2019-11-18 DIAGNOSIS — R1310 Dysphagia, unspecified: Secondary | ICD-10-CM | POA: Diagnosis not present

## 2019-11-18 DIAGNOSIS — R296 Repeated falls: Secondary | ICD-10-CM | POA: Diagnosis not present

## 2019-11-19 DIAGNOSIS — S42201D Unspecified fracture of upper end of right humerus, subsequent encounter for fracture with routine healing: Secondary | ICD-10-CM | POA: Diagnosis not present

## 2019-11-19 DIAGNOSIS — M6281 Muscle weakness (generalized): Secondary | ICD-10-CM | POA: Diagnosis not present

## 2019-11-19 DIAGNOSIS — R1310 Dysphagia, unspecified: Secondary | ICD-10-CM | POA: Diagnosis not present

## 2019-11-19 DIAGNOSIS — R296 Repeated falls: Secondary | ICD-10-CM | POA: Diagnosis not present

## 2019-11-20 DIAGNOSIS — M6281 Muscle weakness (generalized): Secondary | ICD-10-CM | POA: Diagnosis not present

## 2019-11-20 DIAGNOSIS — S42201D Unspecified fracture of upper end of right humerus, subsequent encounter for fracture with routine healing: Secondary | ICD-10-CM | POA: Diagnosis not present

## 2019-11-20 DIAGNOSIS — R296 Repeated falls: Secondary | ICD-10-CM | POA: Diagnosis not present

## 2019-11-20 DIAGNOSIS — R1310 Dysphagia, unspecified: Secondary | ICD-10-CM | POA: Diagnosis not present

## 2019-11-21 DIAGNOSIS — S42201D Unspecified fracture of upper end of right humerus, subsequent encounter for fracture with routine healing: Secondary | ICD-10-CM | POA: Diagnosis not present

## 2019-11-21 DIAGNOSIS — R1310 Dysphagia, unspecified: Secondary | ICD-10-CM | POA: Diagnosis not present

## 2019-11-21 DIAGNOSIS — R296 Repeated falls: Secondary | ICD-10-CM | POA: Diagnosis not present

## 2019-11-21 DIAGNOSIS — M6281 Muscle weakness (generalized): Secondary | ICD-10-CM | POA: Diagnosis not present

## 2019-11-22 DIAGNOSIS — R278 Other lack of coordination: Secondary | ICD-10-CM | POA: Diagnosis not present

## 2019-11-22 DIAGNOSIS — D7582 Heparin induced thrombocytopenia (HIT): Secondary | ICD-10-CM | POA: Diagnosis not present

## 2019-11-22 DIAGNOSIS — Z741 Need for assistance with personal care: Secondary | ICD-10-CM | POA: Diagnosis not present

## 2019-11-22 DIAGNOSIS — Z03818 Encounter for observation for suspected exposure to other biological agents ruled out: Secondary | ICD-10-CM | POA: Diagnosis not present

## 2019-11-22 DIAGNOSIS — R1310 Dysphagia, unspecified: Secondary | ICD-10-CM | POA: Diagnosis not present

## 2019-11-22 DIAGNOSIS — E559 Vitamin D deficiency, unspecified: Secondary | ICD-10-CM | POA: Diagnosis not present

## 2019-11-22 DIAGNOSIS — N185 Chronic kidney disease, stage 5: Secondary | ICD-10-CM | POA: Diagnosis not present

## 2019-11-22 DIAGNOSIS — S42211S Unspecified displaced fracture of surgical neck of right humerus, sequela: Secondary | ICD-10-CM | POA: Diagnosis not present

## 2019-11-22 DIAGNOSIS — R2689 Other abnormalities of gait and mobility: Secondary | ICD-10-CM | POA: Diagnosis not present

## 2019-11-23 DIAGNOSIS — Z741 Need for assistance with personal care: Secondary | ICD-10-CM | POA: Diagnosis not present

## 2019-11-23 DIAGNOSIS — R1310 Dysphagia, unspecified: Secondary | ICD-10-CM | POA: Diagnosis not present

## 2019-11-23 DIAGNOSIS — R278 Other lack of coordination: Secondary | ICD-10-CM | POA: Diagnosis not present

## 2019-11-23 DIAGNOSIS — R2689 Other abnormalities of gait and mobility: Secondary | ICD-10-CM | POA: Diagnosis not present

## 2019-11-29 DIAGNOSIS — Z03818 Encounter for observation for suspected exposure to other biological agents ruled out: Secondary | ICD-10-CM | POA: Diagnosis not present

## 2019-12-01 DIAGNOSIS — B9729 Other coronavirus as the cause of diseases classified elsewhere: Secondary | ICD-10-CM | POA: Diagnosis not present

## 2019-12-01 DIAGNOSIS — N185 Chronic kidney disease, stage 5: Secondary | ICD-10-CM | POA: Diagnosis not present

## 2019-12-03 DIAGNOSIS — R1110 Vomiting, unspecified: Secondary | ICD-10-CM | POA: Diagnosis not present

## 2019-12-06 DIAGNOSIS — Z03818 Encounter for observation for suspected exposure to other biological agents ruled out: Secondary | ICD-10-CM | POA: Diagnosis not present

## 2019-12-12 DIAGNOSIS — R1310 Dysphagia, unspecified: Secondary | ICD-10-CM | POA: Diagnosis not present

## 2019-12-12 DIAGNOSIS — R2689 Other abnormalities of gait and mobility: Secondary | ICD-10-CM | POA: Diagnosis not present

## 2019-12-12 DIAGNOSIS — Z741 Need for assistance with personal care: Secondary | ICD-10-CM | POA: Diagnosis not present

## 2019-12-12 DIAGNOSIS — R278 Other lack of coordination: Secondary | ICD-10-CM | POA: Diagnosis not present

## 2019-12-13 DIAGNOSIS — R2689 Other abnormalities of gait and mobility: Secondary | ICD-10-CM | POA: Diagnosis not present

## 2019-12-13 DIAGNOSIS — Z741 Need for assistance with personal care: Secondary | ICD-10-CM | POA: Diagnosis not present

## 2019-12-13 DIAGNOSIS — Z03818 Encounter for observation for suspected exposure to other biological agents ruled out: Secondary | ICD-10-CM | POA: Diagnosis not present

## 2019-12-13 DIAGNOSIS — R1310 Dysphagia, unspecified: Secondary | ICD-10-CM | POA: Diagnosis not present

## 2019-12-13 DIAGNOSIS — R278 Other lack of coordination: Secondary | ICD-10-CM | POA: Diagnosis not present

## 2019-12-14 DIAGNOSIS — R2689 Other abnormalities of gait and mobility: Secondary | ICD-10-CM | POA: Diagnosis not present

## 2019-12-14 DIAGNOSIS — Z741 Need for assistance with personal care: Secondary | ICD-10-CM | POA: Diagnosis not present

## 2019-12-14 DIAGNOSIS — R278 Other lack of coordination: Secondary | ICD-10-CM | POA: Diagnosis not present

## 2019-12-14 DIAGNOSIS — R1310 Dysphagia, unspecified: Secondary | ICD-10-CM | POA: Diagnosis not present

## 2019-12-15 DIAGNOSIS — R278 Other lack of coordination: Secondary | ICD-10-CM | POA: Diagnosis not present

## 2019-12-15 DIAGNOSIS — R1310 Dysphagia, unspecified: Secondary | ICD-10-CM | POA: Diagnosis not present

## 2019-12-15 DIAGNOSIS — R2689 Other abnormalities of gait and mobility: Secondary | ICD-10-CM | POA: Diagnosis not present

## 2019-12-15 DIAGNOSIS — Z741 Need for assistance with personal care: Secondary | ICD-10-CM | POA: Diagnosis not present

## 2019-12-16 DIAGNOSIS — Z741 Need for assistance with personal care: Secondary | ICD-10-CM | POA: Diagnosis not present

## 2019-12-16 DIAGNOSIS — R2689 Other abnormalities of gait and mobility: Secondary | ICD-10-CM | POA: Diagnosis not present

## 2019-12-16 DIAGNOSIS — R1310 Dysphagia, unspecified: Secondary | ICD-10-CM | POA: Diagnosis not present

## 2019-12-16 DIAGNOSIS — R278 Other lack of coordination: Secondary | ICD-10-CM | POA: Diagnosis not present

## 2019-12-17 DIAGNOSIS — Z741 Need for assistance with personal care: Secondary | ICD-10-CM | POA: Diagnosis not present

## 2019-12-17 DIAGNOSIS — R2689 Other abnormalities of gait and mobility: Secondary | ICD-10-CM | POA: Diagnosis not present

## 2019-12-17 DIAGNOSIS — R1310 Dysphagia, unspecified: Secondary | ICD-10-CM | POA: Diagnosis not present

## 2019-12-17 DIAGNOSIS — R278 Other lack of coordination: Secondary | ICD-10-CM | POA: Diagnosis not present

## 2019-12-18 DIAGNOSIS — R2689 Other abnormalities of gait and mobility: Secondary | ICD-10-CM | POA: Diagnosis not present

## 2019-12-18 DIAGNOSIS — R278 Other lack of coordination: Secondary | ICD-10-CM | POA: Diagnosis not present

## 2019-12-18 DIAGNOSIS — Z741 Need for assistance with personal care: Secondary | ICD-10-CM | POA: Diagnosis not present

## 2019-12-18 DIAGNOSIS — R1310 Dysphagia, unspecified: Secondary | ICD-10-CM | POA: Diagnosis not present

## 2019-12-19 DIAGNOSIS — R2689 Other abnormalities of gait and mobility: Secondary | ICD-10-CM | POA: Diagnosis not present

## 2019-12-19 DIAGNOSIS — R278 Other lack of coordination: Secondary | ICD-10-CM | POA: Diagnosis not present

## 2019-12-19 DIAGNOSIS — R1310 Dysphagia, unspecified: Secondary | ICD-10-CM | POA: Diagnosis not present

## 2019-12-19 DIAGNOSIS — Z741 Need for assistance with personal care: Secondary | ICD-10-CM | POA: Diagnosis not present

## 2019-12-20 DIAGNOSIS — K6389 Other specified diseases of intestine: Secondary | ICD-10-CM | POA: Diagnosis not present

## 2019-12-20 DIAGNOSIS — R2689 Other abnormalities of gait and mobility: Secondary | ICD-10-CM | POA: Diagnosis not present

## 2019-12-20 DIAGNOSIS — Z4682 Encounter for fitting and adjustment of non-vascular catheter: Secondary | ICD-10-CM | POA: Diagnosis not present

## 2019-12-20 DIAGNOSIS — E875 Hyperkalemia: Secondary | ICD-10-CM | POA: Diagnosis not present

## 2019-12-20 DIAGNOSIS — J80 Acute respiratory distress syndrome: Secondary | ICD-10-CM | POA: Diagnosis not present

## 2019-12-20 DIAGNOSIS — D649 Anemia, unspecified: Secondary | ICD-10-CM | POA: Diagnosis not present

## 2019-12-20 DIAGNOSIS — D7582 Heparin induced thrombocytopenia (HIT): Secondary | ICD-10-CM | POA: Diagnosis not present

## 2019-12-20 DIAGNOSIS — R918 Other nonspecific abnormal finding of lung field: Secondary | ICD-10-CM | POA: Diagnosis not present

## 2019-12-20 DIAGNOSIS — E870 Hyperosmolality and hypernatremia: Secondary | ICD-10-CM | POA: Diagnosis not present

## 2019-12-20 DIAGNOSIS — R278 Other lack of coordination: Secondary | ICD-10-CM | POA: Diagnosis not present

## 2019-12-20 DIAGNOSIS — N189 Chronic kidney disease, unspecified: Secondary | ICD-10-CM | POA: Diagnosis not present

## 2019-12-20 DIAGNOSIS — J189 Pneumonia, unspecified organism: Secondary | ICD-10-CM | POA: Diagnosis not present

## 2019-12-20 DIAGNOSIS — R1310 Dysphagia, unspecified: Secondary | ICD-10-CM | POA: Diagnosis not present

## 2019-12-20 DIAGNOSIS — R05 Cough: Secondary | ICD-10-CM | POA: Diagnosis not present

## 2019-12-20 DIAGNOSIS — Z03818 Encounter for observation for suspected exposure to other biological agents ruled out: Secondary | ICD-10-CM | POA: Diagnosis not present

## 2019-12-20 DIAGNOSIS — I129 Hypertensive chronic kidney disease with stage 1 through stage 4 chronic kidney disease, or unspecified chronic kidney disease: Secondary | ICD-10-CM | POA: Diagnosis not present

## 2019-12-20 DIAGNOSIS — G9340 Encephalopathy, unspecified: Secondary | ICD-10-CM | POA: Diagnosis not present

## 2019-12-20 DIAGNOSIS — Z743 Need for continuous supervision: Secondary | ICD-10-CM | POA: Diagnosis not present

## 2019-12-20 DIAGNOSIS — R404 Transient alteration of awareness: Secondary | ICD-10-CM | POA: Diagnosis not present

## 2019-12-20 DIAGNOSIS — E559 Vitamin D deficiency, unspecified: Secondary | ICD-10-CM | POA: Diagnosis not present

## 2019-12-20 DIAGNOSIS — N185 Chronic kidney disease, stage 5: Secondary | ICD-10-CM | POA: Diagnosis not present

## 2019-12-20 DIAGNOSIS — Z79899 Other long term (current) drug therapy: Secondary | ICD-10-CM | POA: Diagnosis not present

## 2019-12-20 DIAGNOSIS — N179 Acute kidney failure, unspecified: Secondary | ICD-10-CM | POA: Diagnosis not present

## 2019-12-20 DIAGNOSIS — Z741 Need for assistance with personal care: Secondary | ICD-10-CM | POA: Diagnosis not present

## 2019-12-20 DIAGNOSIS — J690 Pneumonitis due to inhalation of food and vomit: Secondary | ICD-10-CM | POA: Diagnosis not present

## 2019-12-21 DIAGNOSIS — E8809 Other disorders of plasma-protein metabolism, not elsewhere classified: Secondary | ICD-10-CM | POA: Diagnosis not present

## 2019-12-21 DIAGNOSIS — E875 Hyperkalemia: Secondary | ICD-10-CM | POA: Diagnosis not present

## 2019-12-21 DIAGNOSIS — D689 Coagulation defect, unspecified: Secondary | ICD-10-CM | POA: Diagnosis not present

## 2019-12-21 DIAGNOSIS — E876 Hypokalemia: Secondary | ICD-10-CM | POA: Diagnosis not present

## 2019-12-21 DIAGNOSIS — G9340 Encephalopathy, unspecified: Secondary | ICD-10-CM | POA: Diagnosis not present

## 2019-12-21 DIAGNOSIS — I82629 Acute embolism and thrombosis of deep veins of unspecified upper extremity: Secondary | ICD-10-CM | POA: Diagnosis not present

## 2019-12-21 DIAGNOSIS — I120 Hypertensive chronic kidney disease with stage 5 chronic kidney disease or end stage renal disease: Secondary | ICD-10-CM | POA: Diagnosis not present

## 2019-12-21 DIAGNOSIS — I82B11 Acute embolism and thrombosis of right subclavian vein: Secondary | ICD-10-CM | POA: Diagnosis not present

## 2019-12-21 DIAGNOSIS — E870 Hyperosmolality and hypernatremia: Secondary | ICD-10-CM | POA: Diagnosis not present

## 2019-12-21 DIAGNOSIS — N185 Chronic kidney disease, stage 5: Secondary | ICD-10-CM | POA: Diagnosis not present

## 2019-12-21 DIAGNOSIS — I82A19 Acute embolism and thrombosis of unspecified axillary vein: Secondary | ICD-10-CM | POA: Diagnosis not present

## 2019-12-21 DIAGNOSIS — K449 Diaphragmatic hernia without obstruction or gangrene: Secondary | ICD-10-CM | POA: Diagnosis not present

## 2019-12-21 DIAGNOSIS — K920 Hematemesis: Secondary | ICD-10-CM | POA: Diagnosis not present

## 2019-12-21 DIAGNOSIS — E872 Acidosis: Secondary | ICD-10-CM | POA: Diagnosis not present

## 2019-12-21 DIAGNOSIS — E860 Dehydration: Secondary | ICD-10-CM | POA: Diagnosis not present

## 2019-12-21 DIAGNOSIS — U071 COVID-19: Secondary | ICD-10-CM | POA: Diagnosis not present

## 2019-12-21 DIAGNOSIS — Z4682 Encounter for fitting and adjustment of non-vascular catheter: Secondary | ICD-10-CM | POA: Diagnosis not present

## 2019-12-21 DIAGNOSIS — D500 Iron deficiency anemia secondary to blood loss (chronic): Secondary | ICD-10-CM | POA: Diagnosis not present

## 2019-12-21 DIAGNOSIS — T17300A Unspecified foreign body in larynx causing asphyxiation, initial encounter: Secondary | ICD-10-CM | POA: Diagnosis not present

## 2019-12-21 DIAGNOSIS — Z79899 Other long term (current) drug therapy: Secondary | ICD-10-CM | POA: Diagnosis not present

## 2019-12-21 DIAGNOSIS — N19 Unspecified kidney failure: Secondary | ICD-10-CM | POA: Diagnosis not present

## 2019-12-21 DIAGNOSIS — K529 Noninfective gastroenteritis and colitis, unspecified: Secondary | ICD-10-CM | POA: Diagnosis not present

## 2019-12-21 DIAGNOSIS — I129 Hypertensive chronic kidney disease with stage 1 through stage 4 chronic kidney disease, or unspecified chronic kidney disease: Secondary | ICD-10-CM | POA: Diagnosis not present

## 2019-12-21 DIAGNOSIS — T17398A Other foreign object in larynx causing other injury, initial encounter: Secondary | ICD-10-CM | POA: Diagnosis not present

## 2019-12-21 DIAGNOSIS — Z743 Need for continuous supervision: Secondary | ICD-10-CM | POA: Diagnosis not present

## 2019-12-21 DIAGNOSIS — R1310 Dysphagia, unspecified: Secondary | ICD-10-CM | POA: Diagnosis not present

## 2019-12-21 DIAGNOSIS — R269 Unspecified abnormalities of gait and mobility: Secondary | ICD-10-CM | POA: Diagnosis not present

## 2019-12-21 DIAGNOSIS — K6389 Other specified diseases of intestine: Secondary | ICD-10-CM | POA: Diagnosis not present

## 2019-12-21 DIAGNOSIS — R4701 Aphasia: Secondary | ICD-10-CM | POA: Diagnosis not present

## 2019-12-21 DIAGNOSIS — N179 Acute kidney failure, unspecified: Secondary | ICD-10-CM | POA: Diagnosis not present

## 2019-12-21 DIAGNOSIS — D509 Iron deficiency anemia, unspecified: Secondary | ICD-10-CM | POA: Diagnosis not present

## 2019-12-21 DIAGNOSIS — D649 Anemia, unspecified: Secondary | ICD-10-CM | POA: Diagnosis not present

## 2019-12-21 DIAGNOSIS — I82611 Acute embolism and thrombosis of superficial veins of right upper extremity: Secondary | ICD-10-CM | POA: Diagnosis not present

## 2019-12-21 DIAGNOSIS — Z515 Encounter for palliative care: Secondary | ICD-10-CM | POA: Diagnosis not present

## 2019-12-21 DIAGNOSIS — E871 Hypo-osmolality and hyponatremia: Secondary | ICD-10-CM | POA: Diagnosis not present

## 2019-12-21 DIAGNOSIS — Z7401 Bed confinement status: Secondary | ICD-10-CM | POA: Diagnosis not present

## 2019-12-21 DIAGNOSIS — N170 Acute kidney failure with tubular necrosis: Secondary | ICD-10-CM | POA: Diagnosis not present

## 2019-12-21 DIAGNOSIS — R1110 Vomiting, unspecified: Secondary | ICD-10-CM | POA: Diagnosis not present

## 2019-12-21 DIAGNOSIS — R1312 Dysphagia, oropharyngeal phase: Secondary | ICD-10-CM | POA: Diagnosis not present

## 2019-12-21 DIAGNOSIS — E1122 Type 2 diabetes mellitus with diabetic chronic kidney disease: Secondary | ICD-10-CM | POA: Diagnosis not present

## 2019-12-21 DIAGNOSIS — K259 Gastric ulcer, unspecified as acute or chronic, without hemorrhage or perforation: Secondary | ICD-10-CM | POA: Diagnosis not present

## 2019-12-21 DIAGNOSIS — G9341 Metabolic encephalopathy: Secondary | ICD-10-CM | POA: Diagnosis not present

## 2019-12-21 DIAGNOSIS — A0472 Enterocolitis due to Clostridium difficile, not specified as recurrent: Secondary | ICD-10-CM | POA: Diagnosis not present

## 2019-12-21 DIAGNOSIS — N189 Chronic kidney disease, unspecified: Secondary | ICD-10-CM | POA: Diagnosis not present

## 2019-12-21 DIAGNOSIS — I639 Cerebral infarction, unspecified: Secondary | ICD-10-CM | POA: Diagnosis not present

## 2019-12-21 DIAGNOSIS — D631 Anemia in chronic kidney disease: Secondary | ICD-10-CM | POA: Diagnosis not present

## 2019-12-21 DIAGNOSIS — M7989 Other specified soft tissue disorders: Secondary | ICD-10-CM | POA: Diagnosis not present

## 2020-01-16 IMAGING — DX DG ABD PORTABLE 1V
2 series · 2 of 2 positions shown · non-contrast
Comparison: Radiograph 09/22/2019

CLINICAL DATA: Vomiting, fever and nausea

EXAM:
PORTABLE ABDOMEN - 1 VIEW

[abdomen kub (1 of 2)]
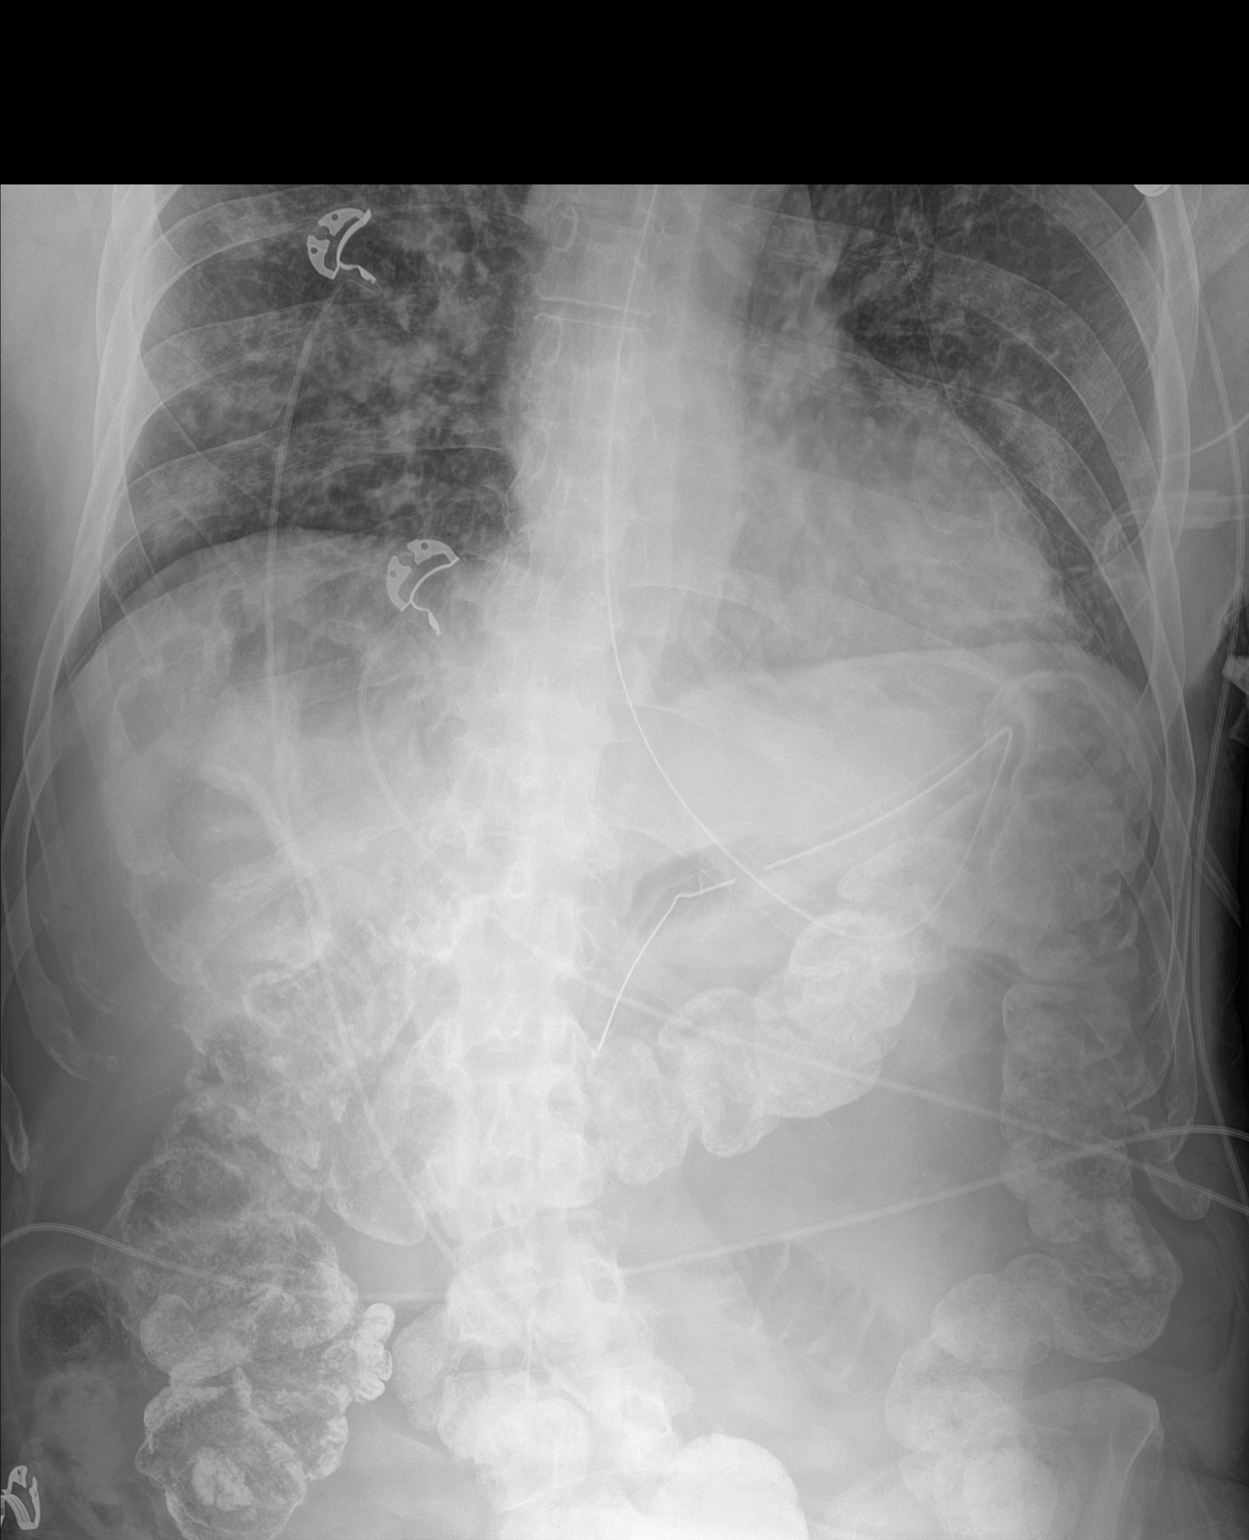

[abdomen kub (2 of 2)]
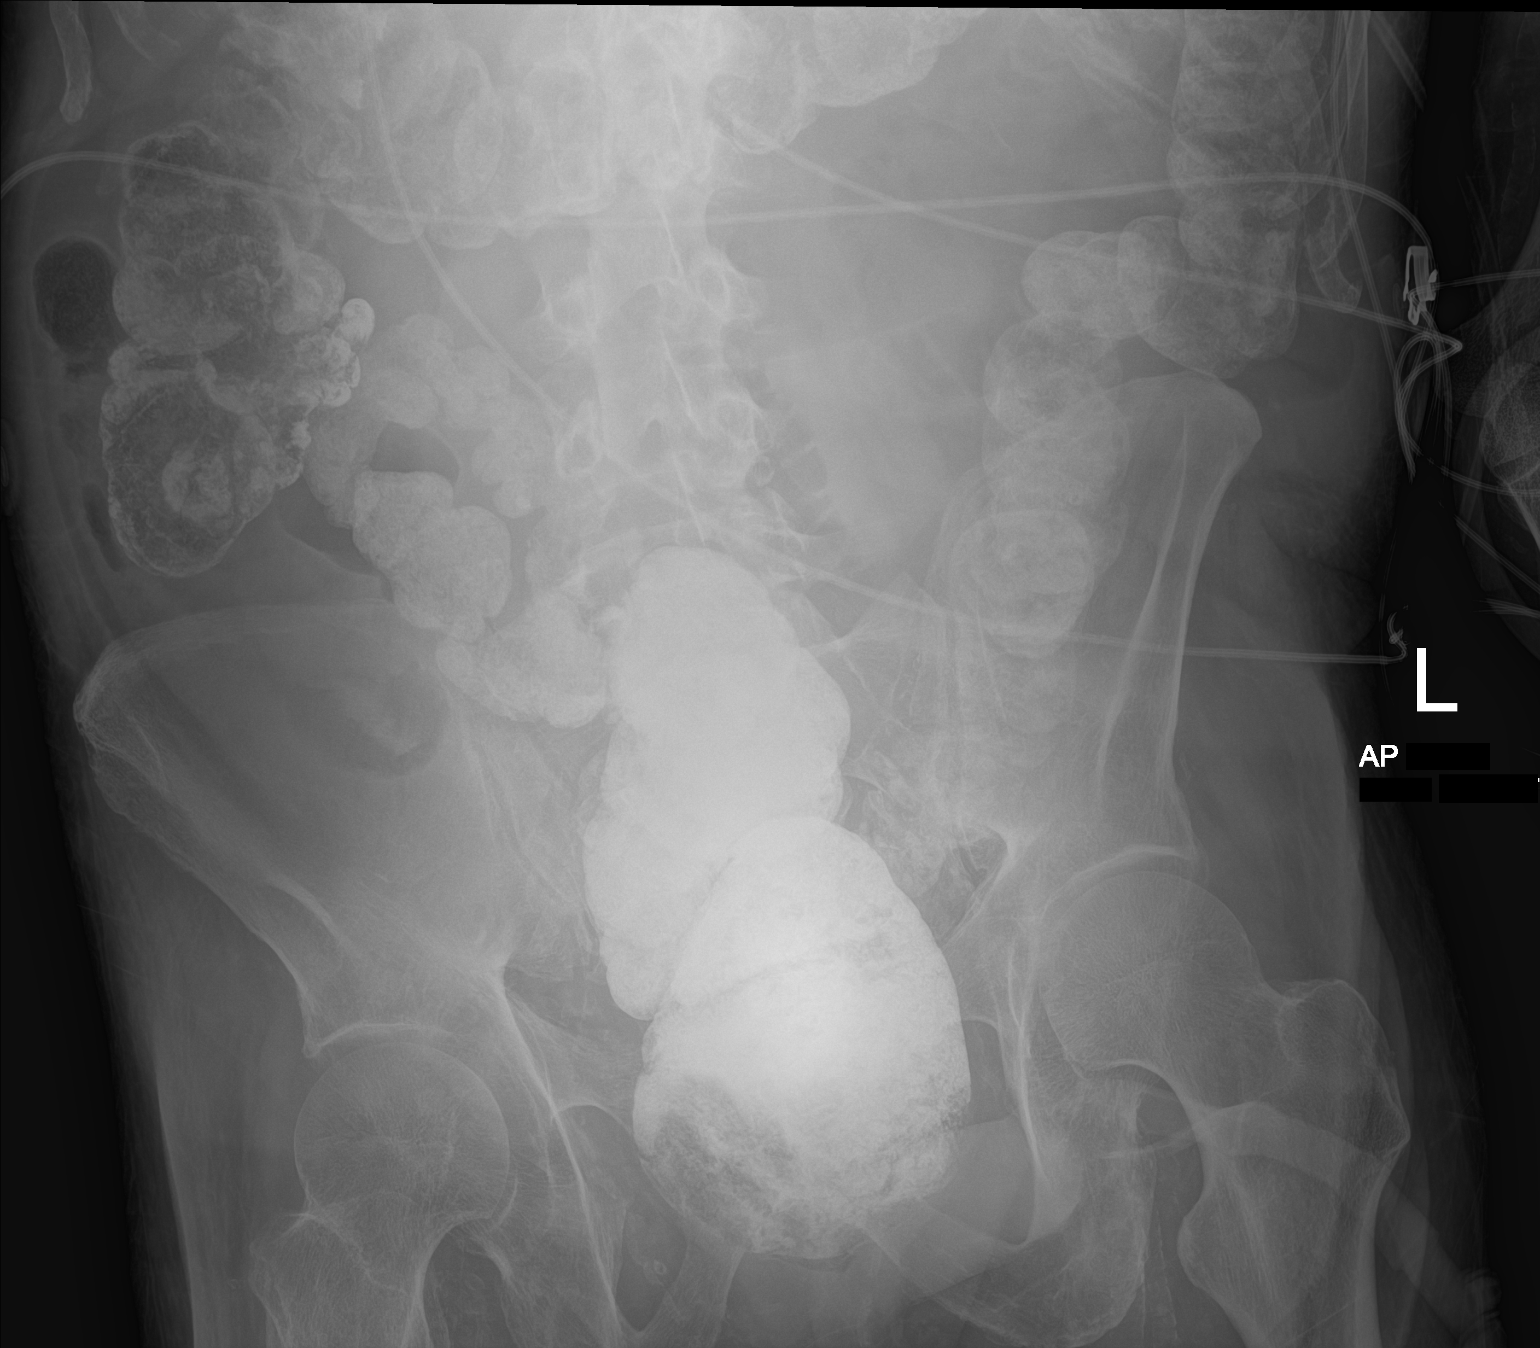

[2 of 2 positions shown; findings below may reference images not displayed]

FINDINGS: A transesophageal tube tip is directed towards the gastric
antrum/duodenal bulb with mild kinking. Side port is distal to the
GE junction. Radiodense contrast material partially opacifies the
colon. Few air-filled loops of bowel are seen in the mid abdomen.
Patchy mixed interstitial airspace opacities are present throughout
the lungs. No pneumothorax or effusion. Cardiomediastinal contours
as included are unremarkable. Mild degenerative changes present in
the spine.
IMPRESSION: 1. A transesophageal tube tip is directed towards the gastric
antrum/duodenal bulb with mild kinking. Side port is distal to the
GE junction.
2. Radiodense contrast material partially opacifies the colon.

## 2020-01-19 IMAGING — DX DG ABD PORTABLE 1V
1 series · 1 of 1 positions shown · non-contrast
Comparison: October 01, 2019

CLINICAL DATA: Ileus

EXAM:
PORTABLE ABDOMEN - 1 VIEW

[abdomen kub]
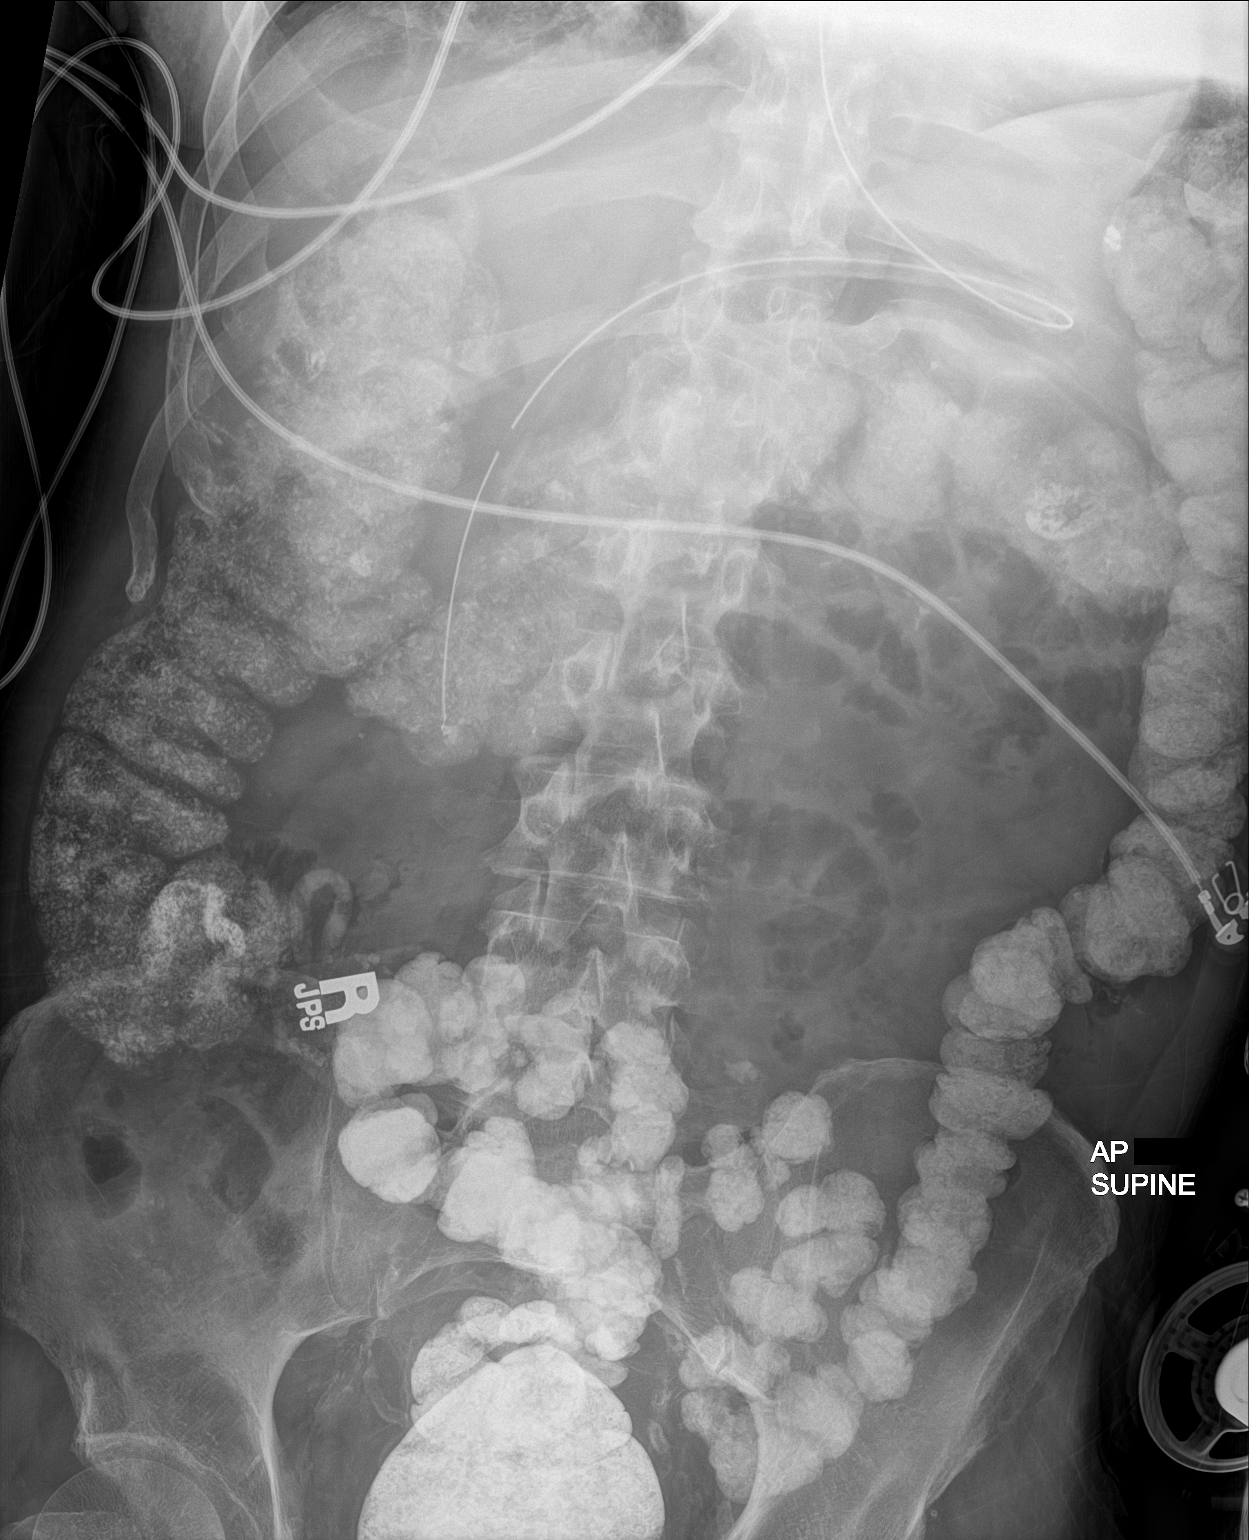

[1 of 1 positions shown; findings below may reference images not displayed]

FINDINGS: NG tube is seen within the first portion of the duodenum. Contrast
is seen in nondilated loops of bowel to the level of the rectum. Air
seen in nondilated bowel in the mid abdomen. It no acute osseous
abnormality.
IMPRESSION: Nonobstructive bowel gas pattern.

## 2020-01-26 DIAGNOSIS — I82A21 Chronic embolism and thrombosis of right axillary vein: Secondary | ICD-10-CM | POA: Diagnosis not present

## 2020-01-26 DIAGNOSIS — R627 Adult failure to thrive: Secondary | ICD-10-CM | POA: Diagnosis not present

## 2020-01-26 DIAGNOSIS — N185 Chronic kidney disease, stage 5: Secondary | ICD-10-CM | POA: Diagnosis not present

## 2020-01-28 DIAGNOSIS — E785 Hyperlipidemia, unspecified: Secondary | ICD-10-CM | POA: Diagnosis not present

## 2020-01-28 DIAGNOSIS — I10 Essential (primary) hypertension: Secondary | ICD-10-CM | POA: Diagnosis not present

## 2020-01-28 DIAGNOSIS — E1165 Type 2 diabetes mellitus with hyperglycemia: Secondary | ICD-10-CM | POA: Diagnosis not present

## 2020-01-28 DIAGNOSIS — R5381 Other malaise: Secondary | ICD-10-CM | POA: Diagnosis not present

## 2020-01-31 DIAGNOSIS — Z03818 Encounter for observation for suspected exposure to other biological agents ruled out: Secondary | ICD-10-CM | POA: Diagnosis not present

## 2020-02-04 DIAGNOSIS — N139 Obstructive and reflux uropathy, unspecified: Secondary | ICD-10-CM | POA: Diagnosis not present

## 2020-02-09 DIAGNOSIS — Z03818 Encounter for observation for suspected exposure to other biological agents ruled out: Secondary | ICD-10-CM | POA: Diagnosis not present

## 2020-02-11 DIAGNOSIS — R6889 Other general symptoms and signs: Secondary | ICD-10-CM | POA: Diagnosis not present

## 2020-02-11 DIAGNOSIS — D649 Anemia, unspecified: Secondary | ICD-10-CM | POA: Diagnosis not present

## 2020-02-25 DIAGNOSIS — N185 Chronic kidney disease, stage 5: Secondary | ICD-10-CM | POA: Diagnosis not present

## 2020-02-25 DIAGNOSIS — I82A21 Chronic embolism and thrombosis of right axillary vein: Secondary | ICD-10-CM | POA: Diagnosis not present

## 2020-02-25 DIAGNOSIS — R627 Adult failure to thrive: Secondary | ICD-10-CM | POA: Diagnosis not present

## 2020-02-29 DIAGNOSIS — Z03818 Encounter for observation for suspected exposure to other biological agents ruled out: Secondary | ICD-10-CM | POA: Diagnosis not present

## 2020-03-06 DIAGNOSIS — Z03818 Encounter for observation for suspected exposure to other biological agents ruled out: Secondary | ICD-10-CM | POA: Diagnosis not present

## 2020-03-06 DIAGNOSIS — N139 Obstructive and reflux uropathy, unspecified: Secondary | ICD-10-CM | POA: Diagnosis not present

## 2020-03-10 DIAGNOSIS — R5381 Other malaise: Secondary | ICD-10-CM | POA: Diagnosis not present

## 2020-03-10 DIAGNOSIS — E1165 Type 2 diabetes mellitus with hyperglycemia: Secondary | ICD-10-CM | POA: Diagnosis not present

## 2020-03-10 DIAGNOSIS — E785 Hyperlipidemia, unspecified: Secondary | ICD-10-CM | POA: Diagnosis not present

## 2020-03-10 DIAGNOSIS — R627 Adult failure to thrive: Secondary | ICD-10-CM | POA: Diagnosis not present

## 2020-03-10 DIAGNOSIS — I10 Essential (primary) hypertension: Secondary | ICD-10-CM | POA: Diagnosis not present

## 2020-03-20 DIAGNOSIS — Z515 Encounter for palliative care: Secondary | ICD-10-CM | POA: Diagnosis not present

## 2020-03-22 DIAGNOSIS — Z1152 Encounter for screening for COVID-19: Secondary | ICD-10-CM | POA: Diagnosis not present

## 2020-03-24 DIAGNOSIS — I82A21 Chronic embolism and thrombosis of right axillary vein: Secondary | ICD-10-CM | POA: Diagnosis not present

## 2020-03-24 DIAGNOSIS — N185 Chronic kidney disease, stage 5: Secondary | ICD-10-CM | POA: Diagnosis not present

## 2020-03-24 DIAGNOSIS — R627 Adult failure to thrive: Secondary | ICD-10-CM | POA: Diagnosis not present

## 2020-03-28 DIAGNOSIS — Z03818 Encounter for observation for suspected exposure to other biological agents ruled out: Secondary | ICD-10-CM | POA: Diagnosis not present

## 2020-04-03 DIAGNOSIS — Z03818 Encounter for observation for suspected exposure to other biological agents ruled out: Secondary | ICD-10-CM | POA: Diagnosis not present

## 2020-04-03 DIAGNOSIS — N139 Obstructive and reflux uropathy, unspecified: Secondary | ICD-10-CM | POA: Diagnosis not present

## 2020-04-07 DIAGNOSIS — R2681 Unsteadiness on feet: Secondary | ICD-10-CM | POA: Diagnosis not present

## 2020-04-10 DIAGNOSIS — E1165 Type 2 diabetes mellitus with hyperglycemia: Secondary | ICD-10-CM | POA: Diagnosis not present

## 2020-04-10 DIAGNOSIS — R5381 Other malaise: Secondary | ICD-10-CM | POA: Diagnosis not present

## 2020-04-10 DIAGNOSIS — E785 Hyperlipidemia, unspecified: Secondary | ICD-10-CM | POA: Diagnosis not present

## 2020-04-10 DIAGNOSIS — I10 Essential (primary) hypertension: Secondary | ICD-10-CM | POA: Diagnosis not present

## 2020-04-19 DIAGNOSIS — Z1152 Encounter for screening for COVID-19: Secondary | ICD-10-CM | POA: Diagnosis not present

## 2020-04-24 DIAGNOSIS — Z03818 Encounter for observation for suspected exposure to other biological agents ruled out: Secondary | ICD-10-CM | POA: Diagnosis not present

## 2020-05-01 DIAGNOSIS — Z03818 Encounter for observation for suspected exposure to other biological agents ruled out: Secondary | ICD-10-CM | POA: Diagnosis not present

## 2020-05-04 DIAGNOSIS — N189 Chronic kidney disease, unspecified: Secondary | ICD-10-CM | POA: Diagnosis not present

## 2020-05-08 DIAGNOSIS — Z03818 Encounter for observation for suspected exposure to other biological agents ruled out: Secondary | ICD-10-CM | POA: Diagnosis not present

## 2020-05-12 DIAGNOSIS — E785 Hyperlipidemia, unspecified: Secondary | ICD-10-CM | POA: Diagnosis not present

## 2020-05-12 DIAGNOSIS — E1165 Type 2 diabetes mellitus with hyperglycemia: Secondary | ICD-10-CM | POA: Diagnosis not present

## 2020-05-12 DIAGNOSIS — I10 Essential (primary) hypertension: Secondary | ICD-10-CM | POA: Diagnosis not present

## 2020-05-12 DIAGNOSIS — E559 Vitamin D deficiency, unspecified: Secondary | ICD-10-CM | POA: Diagnosis not present

## 2020-05-12 DIAGNOSIS — R5381 Other malaise: Secondary | ICD-10-CM | POA: Diagnosis not present

## 2020-05-15 DIAGNOSIS — Z03818 Encounter for observation for suspected exposure to other biological agents ruled out: Secondary | ICD-10-CM | POA: Diagnosis not present

## 2020-05-18 DIAGNOSIS — R1310 Dysphagia, unspecified: Secondary | ICD-10-CM | POA: Diagnosis not present

## 2020-05-19 DIAGNOSIS — N185 Chronic kidney disease, stage 5: Secondary | ICD-10-CM | POA: Diagnosis not present

## 2020-05-19 DIAGNOSIS — N139 Obstructive and reflux uropathy, unspecified: Secondary | ICD-10-CM | POA: Diagnosis not present

## 2020-05-19 DIAGNOSIS — R627 Adult failure to thrive: Secondary | ICD-10-CM | POA: Diagnosis not present

## 2020-05-22 DIAGNOSIS — Z03818 Encounter for observation for suspected exposure to other biological agents ruled out: Secondary | ICD-10-CM | POA: Diagnosis not present

## 2020-05-24 DIAGNOSIS — M79641 Pain in right hand: Secondary | ICD-10-CM | POA: Diagnosis not present

## 2020-05-30 DIAGNOSIS — Z03818 Encounter for observation for suspected exposure to other biological agents ruled out: Secondary | ICD-10-CM | POA: Diagnosis not present

## 2020-06-05 DIAGNOSIS — Z03818 Encounter for observation for suspected exposure to other biological agents ruled out: Secondary | ICD-10-CM | POA: Diagnosis not present

## 2020-06-12 DIAGNOSIS — Z03818 Encounter for observation for suspected exposure to other biological agents ruled out: Secondary | ICD-10-CM | POA: Diagnosis not present

## 2020-06-16 DIAGNOSIS — N185 Chronic kidney disease, stage 5: Secondary | ICD-10-CM | POA: Diagnosis not present

## 2020-06-16 DIAGNOSIS — N139 Obstructive and reflux uropathy, unspecified: Secondary | ICD-10-CM | POA: Diagnosis not present

## 2020-06-16 DIAGNOSIS — R627 Adult failure to thrive: Secondary | ICD-10-CM | POA: Diagnosis not present

## 2020-06-19 DIAGNOSIS — Z03818 Encounter for observation for suspected exposure to other biological agents ruled out: Secondary | ICD-10-CM | POA: Diagnosis not present

## 2020-07-24 DEATH — deceased
